# Patient Record
Sex: Female | Born: 1970 | Race: White | Hispanic: No | Marital: Married | Smoking: Never smoker
Health system: Southern US, Community
[De-identification: ages and names within clinical notes are randomized; demographics above are authoritative.]

## PROBLEM LIST (undated history)

## (undated) DIAGNOSIS — F3130 Bipolar disorder, current episode depressed, mild or moderate severity, unspecified: Secondary | ICD-10-CM

## (undated) DIAGNOSIS — J811 Chronic pulmonary edema: Secondary | ICD-10-CM

## (undated) DIAGNOSIS — J449 Chronic obstructive pulmonary disease, unspecified: Secondary | ICD-10-CM

## (undated) DIAGNOSIS — E119 Type 2 diabetes mellitus without complications: Secondary | ICD-10-CM

## (undated) DIAGNOSIS — R112 Nausea with vomiting, unspecified: Secondary | ICD-10-CM

## (undated) DIAGNOSIS — Z9889 Other specified postprocedural states: Secondary | ICD-10-CM

## (undated) DIAGNOSIS — L0390 Cellulitis, unspecified: Secondary | ICD-10-CM

## (undated) DIAGNOSIS — F32A Depression, unspecified: Secondary | ICD-10-CM

## (undated) DIAGNOSIS — I2699 Other pulmonary embolism without acute cor pulmonale: Secondary | ICD-10-CM

## (undated) DIAGNOSIS — F329 Major depressive disorder, single episode, unspecified: Secondary | ICD-10-CM

## (undated) HISTORY — DX: Type 2 diabetes mellitus without complications: E11.9

## (undated) HISTORY — PX: TUBAL LIGATION: SHX77

## (undated) HISTORY — PX: CHOLECYSTECTOMY: SHX55

---

## 2005-07-24 ENCOUNTER — Ambulatory Visit: Payer: Self-pay | Admitting: Family Medicine

## 2005-08-28 ENCOUNTER — Ambulatory Visit: Payer: Self-pay | Admitting: Family Medicine

## 2005-08-31 ENCOUNTER — Ambulatory Visit: Payer: Self-pay | Admitting: Family Medicine

## 2005-10-12 ENCOUNTER — Ambulatory Visit: Payer: Self-pay | Admitting: Family Medicine

## 2005-10-16 ENCOUNTER — Ambulatory Visit: Payer: Self-pay | Admitting: Family Medicine

## 2006-08-21 ENCOUNTER — Ambulatory Visit: Payer: Self-pay | Admitting: Family Medicine

## 2006-09-27 ENCOUNTER — Ambulatory Visit: Payer: Self-pay | Admitting: Family Medicine

## 2006-10-26 ENCOUNTER — Ambulatory Visit: Payer: Self-pay | Admitting: Family Medicine

## 2007-04-26 ENCOUNTER — Ambulatory Visit (HOSPITAL_COMMUNITY): Admission: RE | Admit: 2007-04-26 | Discharge: 2007-04-26 | Payer: Self-pay | Admitting: Obstetrics and Gynecology

## 2007-06-27 ENCOUNTER — Ambulatory Visit (HOSPITAL_COMMUNITY): Admission: RE | Admit: 2007-06-27 | Discharge: 2007-06-27 | Payer: Self-pay | Admitting: Obstetrics and Gynecology

## 2009-02-15 ENCOUNTER — Ambulatory Visit: Payer: Self-pay | Admitting: Surgery

## 2009-02-16 ENCOUNTER — Emergency Department (HOSPITAL_COMMUNITY): Admission: EM | Admit: 2009-02-16 | Discharge: 2009-02-16 | Payer: Self-pay | Admitting: Emergency Medicine

## 2009-02-17 ENCOUNTER — Inpatient Hospital Stay (HOSPITAL_COMMUNITY): Admission: EM | Admit: 2009-02-17 | Discharge: 2009-02-18 | Payer: Self-pay | Admitting: Emergency Medicine

## 2009-02-17 ENCOUNTER — Encounter (INDEPENDENT_AMBULATORY_CARE_PROVIDER_SITE_OTHER): Payer: Self-pay | Admitting: Internal Medicine

## 2010-10-02 ENCOUNTER — Encounter: Payer: Self-pay | Admitting: Obstetrics and Gynecology

## 2010-10-27 ENCOUNTER — Emergency Department (HOSPITAL_COMMUNITY)
Admission: EM | Admit: 2010-10-27 | Discharge: 2010-10-27 | Disposition: A | Discharge status: Home / Self Care | Payer: Medicare Other | Attending: Emergency Medicine | Admitting: Emergency Medicine

## 2010-10-27 DIAGNOSIS — J449 Chronic obstructive pulmonary disease, unspecified: Secondary | ICD-10-CM | POA: Insufficient documentation

## 2010-10-27 DIAGNOSIS — J4489 Other specified chronic obstructive pulmonary disease: Secondary | ICD-10-CM | POA: Insufficient documentation

## 2010-10-27 DIAGNOSIS — Z79899 Other long term (current) drug therapy: Secondary | ICD-10-CM | POA: Insufficient documentation

## 2010-10-27 DIAGNOSIS — F319 Bipolar disorder, unspecified: Secondary | ICD-10-CM | POA: Insufficient documentation

## 2010-10-27 DIAGNOSIS — I10 Essential (primary) hypertension: Secondary | ICD-10-CM | POA: Insufficient documentation

## 2010-10-27 DIAGNOSIS — R110 Nausea: Secondary | ICD-10-CM | POA: Insufficient documentation

## 2010-10-27 DIAGNOSIS — R109 Unspecified abdominal pain: Secondary | ICD-10-CM | POA: Insufficient documentation

## 2010-10-27 LAB — BASIC METABOLIC PANEL
BUN: 13 mg/dL (ref 6–23)
CO2: 29 mEq/L (ref 19–32)
Calcium: 9.1 mg/dL (ref 8.4–10.5)
Chloride: 99 mEq/L (ref 96–112)
Creatinine, Ser: 0.81 mg/dL (ref 0.4–1.2)
GFR calc non Af Amer: >60 mL/min (ref >60)
Glucose, Bld: 128 mg/dL — ABNORMAL HIGH (ref 70–99)

## 2010-10-27 LAB — CBC
MCHC: 31.1 g/dL (ref 30.0–36.0)
Platelets: 235 10*3/uL (ref 150–400)
RDW: 15.2 % (ref 11.5–15.5)

## 2010-10-27 LAB — URINALYSIS, ROUTINE W REFLEX MICROSCOPIC
Hgb urine dipstick: NEGATIVE
Ketones, ur: NEGATIVE mg/dL
Urine Glucose, Fasting: NEGATIVE mg/dL
pH: 6 (ref 5.0–8.0)

## 2010-10-27 LAB — DIFFERENTIAL
Eosinophils Relative: 1 % (ref 0–5)
Monocytes Relative: 9 % (ref 3–12)
Neutro Abs: 5.7 10*3/uL (ref 1.7–7.7)
Neutrophils Relative %: 60 % (ref 43–77)

## 2010-10-28 LAB — URINE CULTURE
Colony Count: NO GROWTH
Culture  Setup Time: 201202170147

## 2010-11-02 ENCOUNTER — Emergency Department (HOSPITAL_COMMUNITY)
Admission: EM | Admit: 2010-11-02 | Discharge: 2010-11-03 | Discharge status: Against Medical Advice | Payer: Medicare Other | Attending: Emergency Medicine | Admitting: Emergency Medicine

## 2010-11-02 DIAGNOSIS — Z0389 Encounter for observation for other suspected diseases and conditions ruled out: Secondary | ICD-10-CM | POA: Insufficient documentation

## 2010-12-19 LAB — COMPREHENSIVE METABOLIC PANEL
ALT: 28 U/L (ref 0–35)
AST: 44 U/L — ABNORMAL HIGH (ref 0–37)
Albumin: 3.5 g/dL (ref 3.5–5.2)
Alkaline Phosphatase: 91 U/L (ref 39–117)
BUN: 7 mg/dL (ref 6–23)
CO2: 27 mEq/L (ref 19–32)
Calcium: 9.2 mg/dL (ref 8.4–10.5)
Chloride: 99 mEq/L (ref 96–112)
Creatinine, Ser: 0.77 mg/dL (ref 0.4–1.2)
GFR calc Af Amer: >60 mL/min (ref >60)
GFR calc non Af Amer: >60 mL/min (ref >60)
Glucose, Bld: 100 mg/dL — ABNORMAL HIGH (ref 70–99)
Potassium: 3.3 mEq/L — ABNORMAL LOW (ref 3.5–5.1)
Sodium: 136 mEq/L (ref 135–145)
Total Bilirubin: 0.3 mg/dL (ref 0.3–1.2)
Total Protein: 7.2 g/dL (ref 6.0–8.3)

## 2010-12-19 LAB — LACTIC ACID, PLASMA: Lactic Acid, Venous: 1.5 mmol/L (ref 0.5–2.2)

## 2010-12-19 LAB — DIFFERENTIAL
Basophils Absolute: 0 10*3/uL (ref 0.0–0.1)
Basophils Absolute: 0.1 10*3/uL (ref 0.0–0.1)
Basophils Relative: 0 % (ref 0–1)
Basophils Relative: 1 % (ref 0–1)
Eosinophils Absolute: 0.1 10*3/uL (ref 0.0–0.7)
Eosinophils Relative: 1 % (ref 0–5)
Eosinophils Relative: 2 % (ref 0–5)
Lymphocytes Relative: 19 % (ref 12–46)
Lymphocytes Relative: 22 % (ref 12–46)
Lymphs Abs: 2.4 10*3/uL (ref 0.7–4.0)
Monocytes Absolute: 0.6 10*3/uL (ref 0.1–1.0)
Monocytes Absolute: 0.8 10*3/uL (ref 0.1–1.0)
Monocytes Relative: 8 % (ref 3–12)
Neutro Abs: 7.7 10*3/uL (ref 1.7–7.7)
Neutrophils Relative %: 70 % (ref 43–77)

## 2010-12-19 LAB — GLUCOSE, CAPILLARY
Glucose-Capillary: 104 mg/dL — ABNORMAL HIGH (ref 70–99)
Glucose-Capillary: 113 mg/dL — ABNORMAL HIGH (ref 70–99)
Glucose-Capillary: 96 mg/dL (ref 70–99)

## 2010-12-19 LAB — BASIC METABOLIC PANEL
CO2: 28 mEq/L (ref 19–32)
Calcium: 8.6 mg/dL (ref 8.4–10.5)
Chloride: 104 mEq/L (ref 96–112)
Creatinine, Ser: 0.87 mg/dL (ref 0.4–1.2)
GFR calc Af Amer: >60 mL/min (ref >60)

## 2010-12-19 LAB — CBC
HCT: 40.8 % (ref 36.0–46.0)
Hemoglobin: 14.5 g/dL (ref 12.0–15.0)
MCV: 86 fL (ref 78.0–100.0)
MCV: 86.3 fL (ref 78.0–100.0)
Platelets: 265 10*3/uL (ref 150–400)
Platelets: 278 10*3/uL (ref 150–400)
RBC: 4.74 MIL/uL (ref 3.87–5.11)
RDW: 14.5 % (ref 11.5–15.5)
RDW: 14.7 % (ref 11.5–15.5)
RDW: 15.2 % (ref 11.5–15.5)
WBC: 9.8 10*3/uL (ref 4.0–10.5)

## 2010-12-19 LAB — CULTURE, BLOOD (ROUTINE X 2): Culture: NO GROWTH

## 2010-12-19 LAB — BRAIN NATRIURETIC PEPTIDE: Pro B Natriuretic peptide (BNP): 32 pg/mL (ref 0.0–100.0)

## 2011-01-24 NOTE — Discharge Summary (Signed)
Aimee Phillips, Aimee Phillips               ACCOUNT NO.:  [id number]   MEDICAL RECORD NO.:  [id number]          PATIENT TYPE:  INP   LOCATION:  5118                         FACILITY:  [facility]   PHYSICIAN:  Altha Harm, MDDATE OF BIRTH:  07-May-1971   DATE OF ADMISSION:  02/16/2009  DATE OF DISCHARGE:  02/18/2009                               DISCHARGE SUMMARY   DISCHARGE DISPOSITION:  Home.   FINAL DISCHARGE DIAGNOSES:  1. Cellulitis of the left lower extremity.  2. Diabetes, type 2, new onset.  3. Morbid obesity.  4. Increased risk for obstructive sleep apnea, needs further      evaluation.  5. History of hypertension.  6. History of depression.  7. Reactive airway disease.   DISCHARGE MEDICATIONS:  Include the following:  1. Procardia XL 90 mg p.o. daily.  2. Lasix 20 mg p.o. daily.  3. Zoloft 50 mg p.o. daily.  4. Albuterol MDI 2 puffs p.o. every 2 to 4 hours p.r.n.  5. Amaryl 1 mg p.o. daily.  6. Avelox 400 mg p.o. daily x10 days.   CONSULTANTS:  None.   PROCEDURES:  None.   DIAGNOSTIC STUDIES:  A 2-view chest x-ray done on admission which shows  bronchitic-type lung changes without focal infiltrates or effusions.   PRIMARY CARE PHYSICIAN:  Dr. Lysbeth Galas, [place], [place].   ALLERGIES:  NO KNOWN DRUG ALLERGIES.   CODE STATUS:  Full code.   CHIEF COMPLAINT:  Pain in the left lower extremity.   HISTORY OF PRESENT ILLNESS:  Please refer to the H and P by Dr. Ladona Ridgel  for details of the HPI.   However, this is a 40 year old, grossly obese female with history of  hypertension and depression who presents to the emergency room with  increase in pain, tightness, and redness of the left lower extremity.   HOSPITAL COURSE:  1. Due to the significant edema in the left lower extremity, the      patient was admitted and started on IV antibiotics including Zosyn      and vancomycin.  The patient had a traumatic increase in erythema      and swelling.  Her antibiotics were  changed from IV form to a pill      form with antibiotics including Avelox 400 mg p.o. daily.  The      patient continues to defervesce.  She was afebrile and without an      elevation of her white blood cell count.  The patient is being      discharged on Avelox 400 mg to complete a 10-day course.  She is to      follow up with Dr. Lysbeth Galas post the antibiotics reevaluation.  2. New onset diabetes, type 2.  The patient was found to have a      hemoglobin A1c of 6.2 which is diagnostic of diabetes, type 2.  The      patient started on low-dose Amaryl 1 mg p.o. daily.  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  In addition, her husband also      has diabetes and manages it at home.  I have spoken to the patient      about outpatient diabetic education and she states that she will      pursue that at Dr. Joyce Copa office.  3. Hypertension.  Patient was normotensive during her hospitalization.  4. Risks for obstructive sleep apnea.  The patient is morbidly obese      and based upon her neck size she is at increased risk of      obstructive sleep apnea.  I would recommend that the patient does      have followup for that as an outpatient.  Otherwise, the patient      remains stable.   FOLLOWUP:  The patient is to follow up with Dr. Lysbeth Galas within 2 weeks  for further evaluation.  The patient does need to have a fasting  cholesterol done.  This was not done here in the hospital.   DIETARY RESTRICTIONS:  The patient should be on a diabetic, heart-  healthy diet.   PHYSICAL RESTRICTIONS:  Activity as tolerated.   TOTAL TIME FOR THIS DISCHARGE PROCESS:  39 minutes.      Altha Harm, MD  Electronically Signed     MAM/MEDQ  D:  02/18/2009  T:  02/18/2009  Job:  [id number]

## 2011-01-24 NOTE — H&P (Signed)
Aimee Phillips, Aimee Phillips               ACCOUNT NO.:  [id number]   MEDICAL RECORD NO.:  [id number]          PATIENT TYPE:  INP   LOCATION:  5118                         FACILITY:  [facility]   PHYSICIAN:  Melissa L. Ladona Ridgel, MD  DATE OF BIRTH:  10-Mar-1971   DATE OF ADMISSION:  02/16/2009  DATE OF DISCHARGE:                              HISTORY & PHYSICAL   CHIEF COMPLAINT:  Pain in the left leg.   PRIMARY CARE PHYSICIAN:  Delaney Meigs, M.D.   HISTORY OF PRESENT ILLNESS:  The patient is a 40 year old white female  with a past medical history significant for hypertension and depression  who presents to the emergency room this evening on the advice of her in-  laws because of left leg tightness and knee pain with erythema.  The  patient states that approximately 48 hours ago, she developed knee pain  which she thought was from arthritis that she has been diagnosed with.  Subsequently her spouse noticed that her leg was tight, warm and  painful.  She did not note fever at home but here in the emergency room  is documented as having a low grade temperature.  The patient also  reports coughing up some yellowish sputum for which she was treated in  the outpatient setting with Augmentin and it is improved somewhat but  remains present.   REVIEW OF SYSTEMS:  Her weight has been the same.  She has had no fever  or chills that she is aware of but has documented them in the emergency  room.  EYES:  No blurred vision, double vision.  ENT:  No tinnitus,  dysphagia, discharge.  CARDIOVASCULAR:  No chest pain tonight although  she does have occasional chest pain.  No palpitations. PULMONARY:  Shortness of breath is chronic.  GI:  No nausea, vomiting or diarrhea.  No melena or hematochezia.  GU:  No hesitancy, frequency or dysuria.  NEUROLOGIC:  No headache or seizure  disorder.  MUSCULOSKELETAL:  She has arthritis in her knees.  HEMATOLOGIC:  No bleeding disorder.  PSYCHIATRIC:  Positive for some  depression.   ALLERGIES:  ASPIRIN.  She says she has taken it in recent years and it  has not bothered her but she did have a reaction in her younger years.   MEDICATIONS:  1. Procardia XL 90 mg p.o. daily.  2. Lasix 20 mg p.o. daily.  3. Zoloft 50 mg p.o. daily.  4. Albuterol MDI inhaler as needed for shortness of breath.   SOCIAL HISTORY:  She worked as a Lawyer previously.  She has 3 children,  the youngest is 2.  Her husband's name is Jeannett Senior.  His contact number  is [phone].   FAMILY HISTORY:  Mother deceased with a history of breast cancer.  Dad  is deceased with a history of pneumonia and MI.   PHYSICAL EXAMINATION:  VITAL SIGNS:  Temperature 100.1, blood pressure  105/50, pulse 96, respirations 22, saturations 96%.  GENERAL:  This is a morbidly obese white female in no acute distress.  She is resting comfortably.  She is noted, however, to  have some apnea  after receiving Dilaudid and her saturations decreased to 88 after  Dilaudid.  She is otherwise normocephalic, atraumatic.  Pupils are equal, round and  reactive to light and accommodation. Extraocular muscles are intact.  She has anicteric sclerae.  Examination of the ears revealed tympanic  membranes intact bilaterally without discharge.  Examination of the nose  revealed septum midline without discharge.  Examination of the mouth  revealed no oral lesions or lip lesions.  She has reasonable dentition.  NECK:  Supple but short.  I do not appreciate lymphadenopathy or  thyromegaly.  CHEST:  Decreased but clear to auscultation.  I do not appreciate  rhonchi, rales or wheezes.  The patient has left breast candidal yeast  infection.  CARDIOVASCULAR:  Regular rate and rhythm.  Positive S1 and S2, no S3 or  S4.  I do not appreciate a murmur, rub or gallop but again her heart  sounds are distant.  Apical impulse cannot be palpated.  ABDOMEN:  Obese, nontender, nondistended.  Positive bowel sounds with no  hepatosplenomegaly.   No guarding or rebound.  I do not appreciate any  bruits.  There does not appear to be any yeast under the patient's  pannus.  EXTREMITIES:  There is generalized edema, with left greater than right  erythema and tenderness of the calf.  NEUROLOGIC:  She is awake, alert and oriented.  Cranial nerves II  through XII are intact. Power is 5/5. DTRs are 2+.  Plantars are  downgoing.   PERTINENT LABORATORY:  White count is 11, hemoglobin 14.5, hematocrit  45.3, platelets 278.  Sodium 136, potassium 3.3, chloride 99, CO2 27,  BUN 7  creatinine 0.77, calcium  9.2.  Liver function tests within  normal limits although her AST is slightly elevated at 44.  BNP is 32.  Chest x-ray shows mediastinal and hilar contours within normal limits.  There is some peribronchial thickening with increased interstitial  markings consistent with bronchitis, interstitial pneumonitis or  interstitial edema.  There is no pulmonary effusion.  There is no focal  infiltrate.  The bony thorax is intact.  Her lactic acid is 1.5.   ASSESSMENT AND PLAN:  This is a 40 year old morbidly obese white female  who presents with fever, leg redness and swelling for at least 2 days.  1. Leg cellulitis.  Will continue her on vancomycin and Zosyn, started      in the emergency room.  Doppler of the lower extremities will be      ordered in the a.m.  The patient will be on Lovenox 1 mg/kg      adjusted per pharmacy for the appropriate weight dosing.  Will      check hemoglobin A1c in the morning.  2. Hypertension.  Continue Procardia XL and Lasix.  3. Sleep apnea.  Will continue her pulse oximetry monitoring and watch      opiates, will also use 2 liters nasal oxygen if necessary.  4. Depression.  Continue Zoloft.  5. Deep vein thrombosis prophylaxis.  She will be on Lovenox per      pharmacy.  6. Yeast infection.  Will provide with Nystatin cream for the      infection under her left breast.      Melissa L. Ladona Ridgel, MD   Electronically Signed     MLT/MEDQ  D:  02/17/2009  T:  02/17/2009  Job:  [id number]   cc:   Delaney Meigs, M.D.

## 2011-02-02 ENCOUNTER — Emergency Department (HOSPITAL_COMMUNITY): Payer: Medicare Other

## 2011-02-02 ENCOUNTER — Emergency Department (HOSPITAL_COMMUNITY)
Admission: EM | Admit: 2011-02-02 | Discharge: 2011-02-02 | Disposition: A | Discharge status: Home / Self Care | Payer: Medicare Other | Attending: Emergency Medicine | Admitting: Emergency Medicine

## 2011-02-02 DIAGNOSIS — M7989 Other specified soft tissue disorders: Secondary | ICD-10-CM | POA: Insufficient documentation

## 2011-02-02 DIAGNOSIS — R0989 Other specified symptoms and signs involving the circulatory and respiratory systems: Secondary | ICD-10-CM | POA: Insufficient documentation

## 2011-02-02 DIAGNOSIS — J449 Chronic obstructive pulmonary disease, unspecified: Secondary | ICD-10-CM | POA: Insufficient documentation

## 2011-02-02 DIAGNOSIS — J4489 Other specified chronic obstructive pulmonary disease: Secondary | ICD-10-CM | POA: Insufficient documentation

## 2011-02-02 DIAGNOSIS — F319 Bipolar disorder, unspecified: Secondary | ICD-10-CM | POA: Insufficient documentation

## 2011-02-02 DIAGNOSIS — R0609 Other forms of dyspnea: Secondary | ICD-10-CM | POA: Insufficient documentation

## 2011-02-02 DIAGNOSIS — R0602 Shortness of breath: Secondary | ICD-10-CM | POA: Insufficient documentation

## 2011-02-02 DIAGNOSIS — I10 Essential (primary) hypertension: Secondary | ICD-10-CM | POA: Insufficient documentation

## 2011-02-02 LAB — CBC
MCHC: 30.7 g/dL (ref 30.0–36.0)
MCV: 92 fL (ref 78.0–100.0)
Platelets: 200 10*3/uL (ref 150–400)
RDW: 15.6 % — ABNORMAL HIGH (ref 11.5–15.5)
WBC: 7.9 10*3/uL (ref 4.0–10.5)

## 2011-02-02 LAB — BASIC METABOLIC PANEL
BUN: 10 mg/dL (ref 6–23)
Calcium: 9.4 mg/dL (ref 8.4–10.5)
Creatinine, Ser: 0.75 mg/dL (ref 0.4–1.2)
GFR calc non Af Amer: >60 mL/min (ref >60)
Glucose, Bld: 103 mg/dL — ABNORMAL HIGH (ref 70–99)
Sodium: 135 mEq/L (ref 135–145)

## 2011-02-02 LAB — DIFFERENTIAL
Eosinophils Absolute: 0.1 10*3/uL (ref 0.0–0.7)
Eosinophils Relative: 1 % (ref 0–5)
Lymphs Abs: 2 10*3/uL (ref 0.7–4.0)

## 2011-03-20 ENCOUNTER — Emergency Department (HOSPITAL_COMMUNITY)
Admission: EM | Admit: 2011-03-20 | Discharge: 2011-03-20 | Discharge status: Against Medical Advice | Payer: Medicare Other

## 2011-03-20 ENCOUNTER — Encounter: Payer: Self-pay | Admitting: *Deleted

## 2011-03-20 HISTORY — DX: Major depressive disorder, single episode, unspecified: F32.9

## 2011-03-20 HISTORY — DX: Bipolar disorder, current episode depressed, mild or moderate severity, unspecified: F31.30

## 2011-03-20 HISTORY — DX: Chronic obstructive pulmonary disease, unspecified: J44.9

## 2011-03-20 HISTORY — DX: Depression, unspecified: F32.A

## 2011-03-20 NOTE — ED Notes (Signed)
Patient c/o lt. Leg pain and swelling since 4-67months

## 2011-08-01 ENCOUNTER — Encounter (HOSPITAL_COMMUNITY): Payer: Self-pay | Admitting: Emergency Medicine

## 2011-08-01 ENCOUNTER — Observation Stay (HOSPITAL_COMMUNITY)
Admission: EM | Admit: 2011-08-01 | Discharge: 2011-08-02 | Disposition: A | Discharge status: Home / Self Care | Payer: Medicare Other | Attending: Internal Medicine | Admitting: Internal Medicine

## 2011-08-01 DIAGNOSIS — R0609 Other forms of dyspnea: Secondary | ICD-10-CM | POA: Insufficient documentation

## 2011-08-01 DIAGNOSIS — E662 Morbid (severe) obesity with alveolar hypoventilation: Secondary | ICD-10-CM | POA: Diagnosis present

## 2011-08-01 DIAGNOSIS — Z79899 Other long term (current) drug therapy: Secondary | ICD-10-CM | POA: Insufficient documentation

## 2011-08-01 DIAGNOSIS — M7989 Other specified soft tissue disorders: Secondary | ICD-10-CM | POA: Insufficient documentation

## 2011-08-01 DIAGNOSIS — M79609 Pain in unspecified limb: Secondary | ICD-10-CM | POA: Insufficient documentation

## 2011-08-01 DIAGNOSIS — L03119 Cellulitis of unspecified part of limb: Secondary | ICD-10-CM | POA: Insufficient documentation

## 2011-08-01 DIAGNOSIS — J9600 Acute respiratory failure, unspecified whether with hypoxia or hypercapnia: Secondary | ICD-10-CM | POA: Insufficient documentation

## 2011-08-01 DIAGNOSIS — R0989 Other specified symptoms and signs involving the circulatory and respiratory systems: Secondary | ICD-10-CM | POA: Insufficient documentation

## 2011-08-01 DIAGNOSIS — L218 Other seborrheic dermatitis: Secondary | ICD-10-CM | POA: Insufficient documentation

## 2011-08-01 DIAGNOSIS — Z6841 Body Mass Index (BMI) 40.0 and over, adult: Secondary | ICD-10-CM | POA: Insufficient documentation

## 2011-08-01 DIAGNOSIS — R0902 Hypoxemia: Secondary | ICD-10-CM

## 2011-08-01 DIAGNOSIS — F3130 Bipolar disorder, current episode depressed, mild or moderate severity, unspecified: Secondary | ICD-10-CM | POA: Insufficient documentation

## 2011-08-01 DIAGNOSIS — L02419 Cutaneous abscess of limb, unspecified: Principal | ICD-10-CM | POA: Insufficient documentation

## 2011-08-01 LAB — URINALYSIS, ROUTINE W REFLEX MICROSCOPIC
Ketones, ur: NEGATIVE mg/dL
Leukocytes, UA: NEGATIVE
Urobilinogen, UA: 1 mg/dL (ref 0.0–1.0)

## 2011-08-01 LAB — URINE MICROSCOPIC-ADD ON

## 2011-08-01 LAB — DIFFERENTIAL
Basophils Absolute: 0 10*3/uL (ref 0.0–0.1)
Basophils Relative: 0 % (ref 0–1)
Eosinophils Absolute: 0.1 10*3/uL (ref 0.0–0.7)
Eosinophils Relative: 1 % (ref 0–5)
Neutrophils Relative %: 68 % (ref 43–77)

## 2011-08-01 LAB — BASIC METABOLIC PANEL
Calcium: 9 mg/dL (ref 8.4–10.5)
GFR calc Af Amer: >90 mL/min (ref >90)
GFR calc non Af Amer: >90 mL/min (ref >90)
Potassium: 3.9 mEq/L (ref 3.5–5.1)
Sodium: 137 mEq/L (ref 135–145)

## 2011-08-01 LAB — CBC
MCH: 27.7 pg (ref 26.0–34.0)
MCHC: 29.7 g/dL — ABNORMAL LOW (ref 30.0–36.0)
Platelets: 214 10*3/uL (ref 150–400)
RBC: 4.87 MIL/uL (ref 3.87–5.11)
RDW: 15.9 % — ABNORMAL HIGH (ref 11.5–15.5)

## 2011-08-01 MED ORDER — OXYCODONE-ACETAMINOPHEN 5-325 MG PO TABS
1.0000 | ORAL_TABLET | ORAL | Status: DC | PRN
  Administered 2011-08-01 – 2011-08-02 (×2): 1 via ORAL
  Filled 2011-08-01 (×2): qty 1

## 2011-08-01 MED ORDER — ONDANSETRON HCL 4 MG/2ML IJ SOLN
4.0000 mg | Freq: Once | INTRAMUSCULAR | Status: AC
  Administered 2011-08-01: 4 mg via INTRAVENOUS
  Filled 2011-08-01: qty 2

## 2011-08-01 MED ORDER — CEFAZOLIN SODIUM 1-5 GM-% IV SOLN
1.0000 g | Freq: Once | INTRAVENOUS | Status: AC
  Administered 2011-08-01: 1 g via INTRAVENOUS
  Filled 2011-08-01: qty 50

## 2011-08-01 MED ORDER — SODIUM CHLORIDE 0.9 % IV SOLN
INTRAVENOUS | Status: AC
  Administered 2011-08-02: 01:00:00 via INTRAVENOUS

## 2011-08-01 MED ORDER — HYDROMORPHONE HCL PF 1 MG/ML IJ SOLN
1.0000 mg | Freq: Once | INTRAMUSCULAR | Status: AC
  Administered 2011-08-01: 1 mg via INTRAVENOUS
  Filled 2011-08-01: qty 1

## 2011-08-01 NOTE — ED Provider Notes (Addendum)
History     CSN: [id number] Arrival date & time: 08/01/2011  6:32 PM   First MD Initiated Contact with Patient 08/01/11 1850      Chief Complaint  Patient presents with  . Leg Pain  . Hip Pain    (Consider location/radiation/quality/duration/timing/severity/associated sxs/prior treatment) The history is provided by the patient.   She had onset last night of a severe, squeezing pain in both thighs which radiates to both calves. She rates pain at 8/10 currently and 10 out of 10 at its worst. Pain is worse with movement. She has taken Vicodin with partial, temporary relief. She has a history of cellulitis in the past which presented in a similar way, and she did require inpatient intravenous therapy. Past Medical History  Diagnosis Date  . COPD (chronic obstructive pulmonary disease)   . Depression   . Bipolar affect, depressed     Past Surgical History  Procedure Date  . Cesarean section   . Cholecystectomy     History reviewed. No pertinent family history.  History  Substance Use Topics  . Smoking status: Never Smoker   . Smokeless tobacco: Not on file  . Alcohol Use: No    OB History    Grav Para Term Preterm Abortions TAB SAB Ect Mult Living                  Review of Systems  All other systems reviewed and are negative.    Allergies  Review of patient's allergies indicates no known allergies.  Home Medications   Current Outpatient Rx  Name Route Sig Dispense Refill  . ARIPIPRAZOLE 2 MG PO TABS Oral Take 2 mg by mouth daily.      . BUDESONIDE-FORMOTEROL FUMARATE 160-4.5 MCG/ACT IN AERO Inhalation Inhale 2 puffs into the lungs 2 (two) times daily.      . DESVENLAFAXINE SUCCINATE 50 MG PO TB24 Oral Take 50 mg by mouth daily.      . FUROSEMIDE 20 MG PO TABS Oral Take 40 mg by mouth daily.      Marland Kitchen HYDROCODONE-ACETAMINOPHEN 5-500 MG PO TABS Oral Take 1 tablet by mouth every 6 (six) hours as needed.      Marland Kitchen NAPROXEN 250 MG PO TABS Oral Take 500 mg by mouth 2  (two) times daily with a meal.      . POTASSIUM CHLORIDE 20 MEQ PO PACK Oral Take 20 mEq by mouth 2 (two) times daily.      Marland Kitchen SPIRONOLACTONE 50 MG PO TABS Oral Take 50 mg by mouth daily.      . TRAMADOL HCL 50 MG PO TABS Oral Take 50 mg by mouth every 6 (six) hours as needed.      . TRAZODONE HCL 100 MG PO TABS Oral Take 100 mg by mouth at bedtime.        BP 137/0  Pulse 99  Temp(Src) 98.3 F (36.8 C) (Oral)  Resp 24  Ht 5\' 9"  (1.753 m)  Wt 428 lb (194.14 kg)  BMI 63.20 kg/m2  SpO2 99%  LMP 07/31/2011  Physical Exam  Nursing note and vitals reviewed.  A morbidly obese 40 year old female who appears mildly uncomfortable. Vital signs are normal. Head is normocephalic and atraumatic. PERRLA, EOMI. Oropharynx is clear. Neck is supple without adenopathy or JVD. Back is nontender and there is no CVA tenderness. Lungs are clear without rales, wheezes, rhonchi. Heart has regular rate and rhythm without murmur. Abdomen is obese, soft, and nontender. Extremities there is a  2-3+ pitting edema bilaterally. Left leg is more swollen than the right, but the patient states that the swelling in the left leg is actually less than what it usually is. There is mild erythema of the skin of both legs with mild warmth and moderate tenderness diffusely. Capillary refill is delayed but symmetric-refill it occurs over 5 seconds. Neurologic: Mental status is normal, cranial nerves are intact, there are no motor or sensory deficits. ED Course  Procedures (including critical care time)   Labs Reviewed  CBC  DIFFERENTIAL  BASIC METABOLIC PANEL  SEDIMENTATION RATE  URINALYSIS, ROUTINE W REFLEX MICROSCOPIC   No results found.   No diagnosis found. Results for orders placed during the hospital encounter of 08/01/11  CBC      Component Value Range   WBC 9.3  4.0 - 10.5 (K/uL)   RBC 4.87  3.87 - 5.11 (MIL/uL)   Hemoglobin 13.5  12.0 - 15.0 (g/dL)   HCT 57.8  46.9 - 62.9 (%)   MCV 93.4  78.0 - 100.0 (fL)    MCH 27.7  26.0 - 34.0 (pg)   MCHC 29.7 (*) 30.0 - 36.0 (g/dL)   RDW 52.8 (*) 41.3 - 15.5 (%)   Platelets 214  150 - 400 (K/uL)  DIFFERENTIAL      Component Value Range   Neutrophils Relative 68  43 - 77 (%)   Neutro Abs 6.3  1.7 - 7.7 (K/uL)   Lymphocytes Relative 23  12 - 46 (%)   Lymphs Abs 2.1  0.7 - 4.0 (K/uL)   Monocytes Relative 8  3 - 12 (%)   Monocytes Absolute 0.7  0.1 - 1.0 (K/uL)   Eosinophils Relative 1  0 - 5 (%)   Eosinophils Absolute 0.1  0.0 - 0.7 (K/uL)   Basophils Relative 0  0 - 1 (%)   Basophils Absolute 0.0  0.0 - 0.1 (K/uL)  BASIC METABOLIC PANEL      Component Value Range   Sodium 137  135 - 145 (mEq/L)   Potassium 3.9  3.5 - 5.1 (mEq/L)   Chloride 97  96 - 112 (mEq/L)   CO2 35 (*) 19 - 32 (mEq/L)   Glucose, Bld 117 (*) 70 - 99 (mg/dL)   BUN 14  6 - 23 (mg/dL)   Creatinine, Ser 2.44  0.50 - 1.10 (mg/dL)   Calcium 9.0  8.4 - 01.0 (mg/dL)   GFR calc non Af Amer >90  >90 (mL/min)   GFR calc Af Amer >90  >90 (mL/min)  SEDIMENTATION RATE      Component Value Range   Sed Rate 24 (*) 0 - 22 (mm/hr)  URINALYSIS, ROUTINE W REFLEX MICROSCOPIC      Component Value Range   Color, Urine YELLOW  YELLOW    Appearance CLEAR  CLEAR    Specific Gravity, Urine 1.025  1.005 - 1.030    pH 6.5  5.0 - 8.0    Glucose, UA NEGATIVE  NEGATIVE (mg/dL)   Hgb urine dipstick MODERATE (*) NEGATIVE    Bilirubin Urine NEGATIVE  NEGATIVE    Ketones, ur NEGATIVE  NEGATIVE (mg/dL)   Protein, ur TRACE (*) NEGATIVE (mg/dL)   Urobilinogen, UA 1.0  0.0 - 1.0 (mg/dL)   Nitrite NEGATIVE  NEGATIVE    Leukocytes, UA NEGATIVE  NEGATIVE   URINE MICROSCOPIC-ADD ON      Component Value Range   Squamous Epithelial / LPF MANY (*) RARE    WBC, UA 0-2  <3 (WBC/hpf)  RBC / HPF 3-6  <3 (RBC/hpf)   Bacteria, UA RARE  RARE    No results found.  She was given a dose of Ancef to treat her cellulitis. Because of her being afebrile and normal WBC and relatively low sedimentation rate, I felt that  she could probably treat her cellulitis as an outpatient. However, as she was gotten up to 30 for discharge, she was noted to be hypoxic. With minimal ambulation, O2 saturations dropped into the lower 80s. Even at rest, with no oxygen, and her oxygen saturation was 89-90%. Accordingly, arrangements are made to admit the patient to keep her on supplemental oxygen. Because of her weight, and the hypoxia may actually be normal for her with a pickwickian syndrome. High CO2 on her basic metabolic panel would be consistent with chronic CO2 retention which would be associated with chronic hypoxia. Case has been discussed with Dr. Orvan Falconer of [facility] who agrees to put the patient in the hospital under observation status.   MDM  Cellulitis-workup needs to be done to see if infection is serious enough to warrant initial inpatient management.        Dione Booze, MD 08/01/11 [id number]  Dione Booze, MD 09/20/11 [phone]

## 2011-08-01 NOTE — ED Notes (Signed)
Received report assessment unchanged 

## 2011-08-01 NOTE — ED Notes (Signed)
Pts 02 sat continue to decrease without 02. Unable to titrate pt off of 02. RA sats decreased to 79%. Sat pt up on side of bed to get pt more awake from dilaudid that was given earlier. Encouraged pt to take deep breaths, Sats remained between 83-86% on RA.  Pt had no complaints, however, pt appeared labored from minimal activity Pt states this is her baseline.  02 reapplied at 2L  sats increased to 92%.

## 2011-08-01 NOTE — ED Notes (Signed)
Pt c/o left leg and hip pain since last night.

## 2011-08-01 NOTE — H&P (Signed)
PCP:   Aniceto Boss, PA   Chief Complaint:  Pain left leg since last night  HPI: Aimee Phillips is an 40 y.o. morbidly obese Caucasian female.  Ends to the emergency room because of recent onset of pain and redness of the left leg. Denies history of trauma, denies sick contacts. He should was treated in the emergency room and was being prepared to be discharged home on oral antibiotics for cellulitis, then it was noted that her saturations were in the low 80s after having received a dose of Dilaudid for pain. Patient gives a history of easy daytime somnolence, and frequent waking at night. As of her body habitus she is presumed to have chronic hypoxia is considered unsafe to be discharged on the hospitalist service was called to assist with management.  She has marked dyspnea on exertion and it was extremely self-conscious about her weight and would love assistance with weight loss. Patient reports that she has been told in the past that she likely has obstructive sleep apnea, but the PA she sees in [place], has consistently ignored her a request for referral. He gives her last known weight as 482 pounds and her height is 5 feet 9 inches.   Review of Systems:  The patient denies anorexia, fever, weight loss,, vision loss, decreased hearing, hoarseness, chest pain, syncope, , peripheral edema, balance deficits, hemoptysis, abdominal pain, melena, hematochezia, severe indigestion/heartburn, hematuria, incontinence, genital sores, muscle weakness, suspicious skin lesions, transient blindness, difficulty walking, depression, unusual weight change, abnormal bleeding, enlarged lymph nodes, angioedema, and breast masses.   Past Medical History  Diagnosis Date  . COPD (chronic obstructive pulmonary disease)   . Depression   . Bipolar affect, depressed     Past Surgical History  Procedure Date  . Cesarean section   . Cholecystectomy     Medications:  HOME MEDS: Prior to Admission  medications   Medication Sig Start Date End Date Taking? Authorizing Provider  albuterol (PROAIR HFA) 108 (90 BASE) MCG/ACT inhaler Inhale 2 puffs into the lungs 2 (two) times daily.     Yes Historical Provider, MD  ARIPiprazole (ABILIFY) 2 MG tablet Take 2 mg by mouth every morning.    Yes Historical Provider, MD  budesonide-formoterol (SYMBICORT) 160-4.5 MCG/ACT inhaler Inhale 2 puffs into the lungs 2 (two) times daily.     Yes Historical Provider, MD  desvenlafaxine (PRISTIQ) 50 MG 24 hr tablet Take 50 mg by mouth every morning.    Yes Historical Provider, MD  furosemide (LASIX) 20 MG tablet Take 40 mg by mouth every morning.    Yes Historical Provider, MD  HYDROcodone-acetaminophen (VICODIN) 5-500 MG per tablet Take 1 tablet by mouth 3 (three) times daily as needed. For pain   Yes Historical Provider, MD  loratadine (CLARITIN) 10 MG tablet Take 10 mg by mouth every morning.     Yes Historical Provider, MD  naproxen (NAPROSYN) 250 MG tablet Take 500 mg by mouth 2 (two) times daily with a meal.     Yes Historical Provider, MD  OVER THE COUNTER MEDICATION Take 2 capsules by mouth 3 (three) times daily.     Yes Historical Provider, MD  potassium chloride (KLOR-CON) 10 MEQ CR tablet Take 10 mEq by mouth every morning.     Yes Historical Provider, MD  spironolactone (ALDACTONE) 50 MG tablet Take 50 mg by mouth every morning.    Yes Historical Provider, MD  traMADol (ULTRAM) 50 MG tablet Take 50 mg by mouth 3 (three) times  daily as needed. For pain   Yes Historical Provider, MD  traMADol (ULTRAM) 50 MG tablet Take 50 mg by mouth at bedtime. Maximum dose= 8 tablets per day/ PRESCRIPTION STATES **Take one tablet by mouth 3 times daily for pain**    Yes Historical Provider, MD  traZODone (DESYREL) 100 MG tablet Take 100 mg by mouth at bedtime.     Yes Historical Provider, MD     Allergies:  No Known Allergies  Social History:   reports that she has never smoked. She does not have any smokeless  tobacco history on file. She reports that she does not drink alcohol or use illicit drugs. with husband and children the youngest is 88 years old  Family History: History reviewed. No pertinent family history. mother had breast cancer father had acute MI   Physical Exam: Filed Vitals:   08/01/11 1741 08/01/11 2025 08/01/11 2026  BP: 137/70 113/70   Pulse: 99 99   Temp: 98.3 F (36.8 C) 98.3 F (36.8 C)   TempSrc: Oral Oral   Resp: 24 24   Height: 5\' 9"  (1.753 m)    Weight: 194.14 kg (428 lb)    SpO2: 99% 77% 92%   Blood pressure 113/70, pulse 99, temperature 98.3 F (36.8 C), temperature source Oral, resp. rate 24, height 5\' 9"  (1.753 m), weight 194.14 kg (428 lb), last menstrual period 07/31/2011, SpO2 92.00%.  GEN:  Morbidly obese depressed looking Caucasian lady sitting up in the stretcher complaining of pain in the leg; cooperative with exam PSYCH:  alert and oriented x4;  appears depressed; affect is normal HEENT: Mucous membranes pink and anicteric; PERRLA; EOM intact; no cervical lymphadenopathy nor thyromegaly or carotid bruit; no JVD; seborrheic eczema of scalp and face Breasts:: Not examined CHEST WALL: No tenderness CHEST: Distant heart sounds but Normal respiration, clear to auscultation bilaterally HEART: Regular rate and rhythm; no murmurs rubs or gallops BACK: No kyphosis or scoliosis; no CVA tenderness ABDOMEN: Morbidly obese Obese, soft non-tender; no masses, no organomegaly, normal abdominal bowel sounds; huge pannus erythema and striae of the lower abdomen; no intertriginous candida. Rectal Exam: Not done EXTREMITIES: No bone or joint deformity; age-appropriate arthropathy of the hands and knees; trace bilateral edema; no ulcerations. Erythema of the right leg from ankle to knee Genitalia: not examined PULSES: 2+ and symmetric SKIN: Other than noted above Normal hydration no rash or ulceration CNS: Cranial nerves 2-12 grossly intact no focal neurologic deficit    Labs & Imaging Results for orders placed during the hospital encounter of 08/01/11 (from the past 48 hour(s))  CBC     Status: Abnormal   Collection Time   08/01/11  7:41 PM      Component Value Range Comment   WBC 9.3  4.0 - 10.5 (K/uL)    RBC 4.87  3.87 - 5.11 (MIL/uL)    Hemoglobin 13.5  12.0 - 15.0 (g/dL)    HCT 78.2  95.6 - 21.3 (%)    MCV 93.4  78.0 - 100.0 (fL)    MCH 27.7  26.0 - 34.0 (pg)    MCHC 29.7 (*) 30.0 - 36.0 (g/dL)    RDW 08.6 (*) 57.8 - 15.5 (%)    Platelets 214  150 - 400 (K/uL)   DIFFERENTIAL     Status: Normal   Collection Time   08/01/11  7:41 PM      Component Value Range Comment   Neutrophils Relative 68  43 - 77 (%)    Neutro  Abs 6.3  1.7 - 7.7 (K/uL)    Lymphocytes Relative 23  12 - 46 (%)    Lymphs Abs 2.1  0.7 - 4.0 (K/uL)    Monocytes Relative 8  3 - 12 (%)    Monocytes Absolute 0.7  0.1 - 1.0 (K/uL)    Eosinophils Relative 1  0 - 5 (%)    Eosinophils Absolute 0.1  0.0 - 0.7 (K/uL)    Basophils Relative 0  0 - 1 (%)    Basophils Absolute 0.0  0.0 - 0.1 (K/uL)   BASIC METABOLIC PANEL     Status: Abnormal   Collection Time   08/01/11  7:41 PM      Component Value Range Comment   Sodium 137  135 - 145 (mEq/L)    Potassium 3.9  3.5 - 5.1 (mEq/L)    Chloride 97  96 - 112 (mEq/L)    CO2 35 (*) 19 - 32 (mEq/L)    Glucose, Bld 117 (*) 70 - 99 (mg/dL)    BUN 14  6 - 23 (mg/dL)    Creatinine, Ser 1.61  0.50 - 1.10 (mg/dL)    Calcium 9.0  8.4 - 10.5 (mg/dL)    GFR calc non Af Amer >90  >90 (mL/min)    GFR calc Af Amer >90  >90 (mL/min)   SEDIMENTATION RATE     Status: Abnormal   Collection Time   08/01/11  7:41 PM      Component Value Range Comment   Sed Rate 24 (*) 0 - 22 (mm/hr)   URINALYSIS, ROUTINE W REFLEX MICROSCOPIC     Status: Abnormal   Collection Time   08/01/11  7:58 PM      Component Value Range Comment   Color, Urine YELLOW  YELLOW     Appearance CLEAR  CLEAR     Specific Gravity, Urine 1.025  1.005 - 1.030     pH 6.5  5.0 - 8.0      Glucose, UA NEGATIVE  NEGATIVE (mg/dL)    Hgb urine dipstick MODERATE (*) NEGATIVE     Bilirubin Urine NEGATIVE  NEGATIVE     Ketones, ur NEGATIVE  NEGATIVE (mg/dL)    Protein, ur TRACE (*) NEGATIVE (mg/dL)    Urobilinogen, UA 1.0  0.0 - 1.0 (mg/dL)    Nitrite NEGATIVE  NEGATIVE     Leukocytes, UA NEGATIVE  NEGATIVE    URINE MICROSCOPIC-ADD ON     Status: Abnormal   Collection Time   08/01/11  7:58 PM      Component Value Range Comment   Squamous Epithelial / LPF MANY (*) RARE     WBC, UA 0-2  <3 (WBC/hpf)    RBC / HPF 3-6  <3 (RBC/hpf)    Bacteria, UA RARE  RARE     No results found.    Assessment Present on Admission:   .Acute respiratory failure .Obesity hypoventilation syndrome .Cellulitis of leg .Obesity, morbid (more than 100 lbs over ideal weight or BMI > 40) .Bipolar affect, depressed  Seborrheic eczema   PLAN: We'll bring this lady on observation for safety because of her hypoxia with pain medications and treat her with trauma bowel to see if this can control her pain without causing hypoxia. She will likely need a sleep study as an outpatient and be fitted with CPAP or BiPAP for her obstructive sleep apnea/pickwickian syndrome. Will consult a pulmonologist for suggestions.  She will need a weight-loss program and possible consideration for bariatric  surgery   Vancomycin for her cellulitis  Continue her psychotropic medications  Nizoral shampoo for her seborrhea; check HIV status  Other plans as per orders.   Fleet Higham 08/01/2011, 11:53 PM

## 2011-08-01 NOTE — ED Notes (Signed)
Pt states pain to left hip, leg, and ankle that increased in severity last night.  Swelling and redness to left leg noted.  Pt states throbbing pain that intermittently feels like a "wringing and twisting" in her leg.

## 2011-08-02 ENCOUNTER — Observation Stay (HOSPITAL_COMMUNITY): Payer: Medicare Other

## 2011-08-02 ENCOUNTER — Encounter (HOSPITAL_COMMUNITY): Payer: Self-pay

## 2011-08-02 LAB — CBC
MCV: 95.9 fL (ref 78.0–100.0)
Platelets: 216 10*3/uL (ref 150–400)
RBC: 4.89 MIL/uL (ref 3.87–5.11)
WBC: 8.3 10*3/uL (ref 4.0–10.5)

## 2011-08-02 LAB — TSH: TSH: 2.397 u[IU]/mL (ref 0.350–4.500)

## 2011-08-02 LAB — BASIC METABOLIC PANEL
CO2: 34 mEq/L — ABNORMAL HIGH (ref 19–32)
Calcium: 8.6 mg/dL (ref 8.4–10.5)
GFR calc Af Amer: >90 mL/min (ref >90)
Sodium: 136 mEq/L (ref 135–145)

## 2011-08-02 LAB — HEPATIC FUNCTION PANEL
ALT: 24 U/L (ref 0–35)
Bilirubin, Direct: <0.1 mg/dL (ref 0.0–0.3)
Total Protein: 7.1 g/dL (ref 6.0–8.3)

## 2011-08-02 LAB — RAPID HIV SCREEN (WH-MAU): Rapid HIV Screen: NONREACTIVE

## 2011-08-02 LAB — PROTIME-INR
INR: 0.92 (ref 0.00–1.49)
Prothrombin Time: 12.6 seconds (ref 11.6–15.2)

## 2011-08-02 LAB — MAGNESIUM: Magnesium: 1.9 mg/dL (ref 1.5–2.5)

## 2011-08-02 MED ORDER — VANCOMYCIN HCL IN DEXTROSE 1-5 GM/200ML-% IV SOLN: 1000.0000 mg | Freq: Once | INTRAVENOUS | Status: DC

## 2011-08-02 MED ORDER — VANCOMYCIN HCL IN DEXTROSE 1-5 GM/200ML-% IV SOLN
INTRAVENOUS | Status: AC
  Filled 2011-08-02: qty 400

## 2011-08-02 MED ORDER — SPIRONOLACTONE 25 MG PO TABS
50.0000 mg | ORAL_TABLET | ORAL | Status: DC
  Administered 2011-08-02: 50 mg via ORAL
  Filled 2011-08-02: qty 2

## 2011-08-02 MED ORDER — ALBUTEROL SULFATE (5 MG/ML) 0.5% IN NEBU: 2.5000 mg | INHALATION_SOLUTION | Freq: Four times a day (QID) | RESPIRATORY_TRACT | Status: DC | PRN

## 2011-08-02 MED ORDER — ENOXAPARIN SODIUM 40 MG/0.4ML ~~LOC~~ SOLN
40.0000 mg | SUBCUTANEOUS | Status: DC
  Administered 2011-08-02: 40 mg via SUBCUTANEOUS
  Filled 2011-08-02: qty 0.4

## 2011-08-02 MED ORDER — BIOTENE DRY MOUTH MT LIQD
15.0000 mL | Freq: Two times a day (BID) | OROMUCOSAL | Status: DC
  Administered 2011-08-02 (×2): 15 mL via OROMUCOSAL

## 2011-08-02 MED ORDER — DESVENLAFAXINE SUCCINATE ER 50 MG PO TB24
50.0000 mg | ORAL_TABLET | ORAL | Status: DC
  Administered 2011-08-02: 50 mg via ORAL
  Filled 2011-08-02 (×3): qty 1

## 2011-08-02 MED ORDER — ASPIRIN EC 81 MG PO TBEC
81.0000 mg | DELAYED_RELEASE_TABLET | Freq: Every day | ORAL | Status: DC
  Administered 2011-08-02: 81 mg via ORAL
  Filled 2011-08-02: qty 1

## 2011-08-02 MED ORDER — ASPIRIN 81 MG PO TBEC: 81.0000 mg | DELAYED_RELEASE_TABLET | Freq: Every day | ORAL | Status: AC

## 2011-08-02 MED ORDER — ACETAMINOPHEN 325 MG PO TABS
650.0000 mg | ORAL_TABLET | Freq: Four times a day (QID) | ORAL | Status: DC | PRN
  Administered 2011-08-02: 650 mg via ORAL
  Filled 2011-08-02: qty 2

## 2011-08-02 MED ORDER — FLEET ENEMA 7-19 GM/118ML RE ENEM: 1.0000 | ENEMA | RECTAL | Status: DC | PRN

## 2011-08-02 MED ORDER — DOCUSATE SODIUM 100 MG PO CAPS: 100.0000 mg | ORAL_CAPSULE | Freq: Two times a day (BID) | ORAL | Status: DC | PRN

## 2011-08-02 MED ORDER — SENNOSIDES-DOCUSATE SODIUM 8.6-50 MG PO TABS: 2.0000 | ORAL_TABLET | Freq: Every day | ORAL | Status: DC | PRN

## 2011-08-02 MED ORDER — SULFAMETHOXAZOLE-TMP DS 800-160 MG PO TABS: 1.0000 | ORAL_TABLET | Freq: Two times a day (BID) | ORAL | Status: AC

## 2011-08-02 MED ORDER — TRAMADOL HCL 50 MG PO TABS: 50.0000 mg | ORAL_TABLET | Freq: Four times a day (QID) | ORAL | Status: DC | PRN

## 2011-08-02 MED ORDER — ONDANSETRON HCL 4 MG/2ML IJ SOLN: 4.0000 mg | Freq: Four times a day (QID) | INTRAMUSCULAR | Status: DC | PRN

## 2011-08-02 MED ORDER — LORATADINE 10 MG PO TABS
10.0000 mg | ORAL_TABLET | ORAL | Status: DC
  Administered 2011-08-02: 10 mg via ORAL
  Filled 2011-08-02: qty 1

## 2011-08-02 MED ORDER — POLYETHYLENE GLYCOL 3350 17 G PO PACK: 17.0000 g | PACK | Freq: Every day | ORAL | Status: DC | PRN

## 2011-08-02 MED ORDER — TRAZODONE HCL 50 MG PO TABS: 100.0000 mg | ORAL_TABLET | Freq: Every day | ORAL | Status: DC

## 2011-08-02 MED ORDER — ACETAMINOPHEN 650 MG RE SUPP: 650.0000 mg | Freq: Four times a day (QID) | RECTAL | Status: DC | PRN

## 2011-08-02 MED ORDER — VANCOMYCIN HCL 1000 MG IV SOLR
1500.0000 mg | Freq: Two times a day (BID) | INTRAVENOUS | Status: DC
  Filled 2011-08-02 (×4): qty 1500

## 2011-08-02 MED ORDER — VANCOMYCIN HCL IN DEXTROSE 1-5 GM/200ML-% IV SOLN
1000.0000 mg | Freq: Once | INTRAVENOUS | Status: AC
  Administered 2011-08-02: 1000 mg via INTRAVENOUS
  Filled 2011-08-02: qty 200

## 2011-08-02 MED ORDER — HYDROCODONE-ACETAMINOPHEN 5-500 MG PO TABS: 1.0000 | ORAL_TABLET | Freq: Three times a day (TID) | ORAL | Status: DC | PRN

## 2011-08-02 MED ORDER — BISACODYL 10 MG RE SUPP: 10.0000 mg | RECTAL | Status: DC | PRN

## 2011-08-02 MED ORDER — BUDESONIDE-FORMOTEROL FUMARATE 160-4.5 MCG/ACT IN AERO
2.0000 | INHALATION_SPRAY | Freq: Two times a day (BID) | RESPIRATORY_TRACT | Status: DC
  Administered 2011-08-02: 2 via RESPIRATORY_TRACT
  Filled 2011-08-02: qty 6

## 2011-08-02 MED ORDER — FUROSEMIDE 40 MG PO TABS
40.0000 mg | ORAL_TABLET | ORAL | Status: DC
Start: 2011-08-02 — End: 2011-08-02
  Administered 2011-08-02: 40 mg via ORAL
  Filled 2011-08-02: qty 1

## 2011-08-02 MED ORDER — ONDANSETRON HCL 4 MG PO TABS: 4.0000 mg | ORAL_TABLET | ORAL | Status: DC | PRN

## 2011-08-02 MED ORDER — LORATADINE 10 MG PO TABS: 10.0000 mg | ORAL_TABLET | ORAL | Status: DC

## 2011-08-02 MED ORDER — ALBUTEROL SULFATE HFA 108 (90 BASE) MCG/ACT IN AERS
2.0000 | INHALATION_SPRAY | Freq: Two times a day (BID) | RESPIRATORY_TRACT | Status: DC
  Administered 2011-08-02: 2 via RESPIRATORY_TRACT
  Filled 2011-08-02: qty 6.7

## 2011-08-02 MED ORDER — PNEUMOCOCCAL VAC POLYVALENT 25 MCG/0.5ML IJ INJ
0.5000 mL | INJECTION | INTRAMUSCULAR | Status: DC
  Filled 2011-08-02: qty 0.5

## 2011-08-02 MED ORDER — ARIPIPRAZOLE 2 MG PO TABS
2.0000 mg | ORAL_TABLET | ORAL | Status: DC
  Administered 2011-08-02: 2 mg via ORAL
  Filled 2011-08-02 (×3): qty 1

## 2011-08-02 MED ORDER — INFLUENZA VIRUS VACC SPLIT PF IM SUSP
0.5000 mL | INTRAMUSCULAR | Status: DC
  Filled 2011-08-02: qty 0.5

## 2011-08-02 MED ORDER — KETOCONAZOLE 2 % EX SHAM
MEDICATED_SHAMPOO | CUTANEOUS | Status: DC
  Filled 2011-08-02: qty 120

## 2011-08-02 MED ORDER — SULFAMETHOXAZOLE-TMP DS 800-160 MG PO TABS
1.0000 | ORAL_TABLET | Freq: Two times a day (BID) | ORAL | Status: DC
  Administered 2011-08-02: 1 via ORAL
  Filled 2011-08-02: qty 1

## 2011-08-02 MED ORDER — ENOXAPARIN SODIUM 100 MG/ML ~~LOC~~ SOLN
100.0000 mg | Freq: Every day | SUBCUTANEOUS | Status: DC
  Filled 2011-08-02 (×2): qty 1

## 2011-08-02 NOTE — Discharge Summary (Signed)
Aimee Phillips MRN: [id number] DOB/AGE: [birth year] 40 y.o.  Admit date: 08/01/2011 Discharge date: 08/02/2011  Primary Care Physician:  Aniceto Boss, PA   Discharge Diagnoses:   No resolved problems to display.  Active Hospital Problems  Diagnoses Date Noted   . Acute respiratory failure  08/01/2011   . Obesity, morbid (more than 100 lbs over ideal weight or BMI > 40) 08/01/2011   . Obesity hypoventilation syndrome 08/01/2011   . Cellulitis of leg 08/01/2011   . Bipolar affect, depressed      Resolved Hospital Problems  Diagnoses Date Noted Date Resolved     DISCHARGE MEDICATION: Current Discharge Medication List    CONTINUE these medications which have NOT CHANGED   Details  albuterol (PROAIR HFA) 108 (90 BASE) MCG/ACT inhaler Inhale 2 puffs into the lungs 2 (two) times daily.      ARIPiprazole (ABILIFY) 2 MG tablet Take 2 mg by mouth every morning.     budesonide-formoterol (SYMBICORT) 160-4.5 MCG/ACT inhaler Inhale 2 puffs into the lungs 2 (two) times daily.      desvenlafaxine (PRISTIQ) 50 MG 24 hr tablet Take 50 mg by mouth every morning.     furosemide (LASIX) 20 MG tablet Take 40 mg by mouth every morning.     HYDROcodone-acetaminophen (VICODIN) 5-500 MG per tablet Take 1 tablet by mouth 3 (three) times daily as needed. For pain    loratadine (CLARITIN) 10 MG tablet Take 10 mg by mouth every morning.      naproxen (NAPROSYN) 250 MG tablet Take 500 mg by mouth 2 (two) times daily with a meal.      OVER THE COUNTER MEDICATION Take 2 capsules by mouth 3 (three) times daily.      potassium chloride (KLOR-CON) 10 MEQ CR tablet Take 10 mEq by mouth every morning.      spironolactone (ALDACTONE) 50 MG tablet Take 50 mg by mouth every morning.     !! traMADol (ULTRAM) 50 MG tablet Take 50 mg by mouth at bedtime. Maximum dose= 8 tablets per day/ PRESCRIPTION STATES **Take one tablet by mouth 3 times daily for pain**     traZODone (DESYREL) 100 MG tablet Take 100  mg by mouth at bedtime.       !! - Potential duplicate medications found. Please discuss with provider.      Consultants: Dr. Kari Baars  CARDIAC CATH & OTHER PROCEDURES: Doppler of lower extremities: Negative for DVT  No results found for this or any previous visit (from the past 240 hour(s)).  BRIEF ADMITTING H & P: Aimee Phillips is an 40 y.o. morbidly obese Caucasian female. Ends to the emergency room because of recent onset of pain and redness of the left leg. Denies history of trauma, denies sick contacts. He should was treated in the emergency room and was being prepared to be discharged home on oral antibiotics for cellulitis, then it was noted that her saturations were in the low 80s after having received a dose of Dilaudid for pain. Patient gives a history of easy daytime somnolence, and frequent waking at night. As of her body habitus she is presumed to have chronic hypoxia is considered unsafe to be discharged on the hospitalist service was called to assist with management.  She has marked dyspnea on exertion and it was extremely self-conscious about her weight and would love assistance with weight loss.  Patient reports that she has been told in the past that she likely has obstructive sleep apnea, but the  PA she sees in [place], has consistently ignored her a request for referral.  He gives her last known weight as 482 pounds and her height is 5 feet 9 inches.  Physical Exam:  Filed Vitals:    08/01/11 1741  08/01/11 2025  08/01/11 2026   BP:  137/70  113/70    Pulse:  99  99    Temp:  98.3 F (36.8 C)  98.3 F (36.8 C)    TempSrc:  Oral  Oral    Resp:  24  24    Height:  5\' 9"  (1.753 m)     Weight:  194.14 kg (428 lb)     SpO2:  99%  77%  92%    Blood pressure 113/70, pulse 99, temperature 98.3 F (36.8 C), temperature source Oral, resp. rate 24, height 5\' 9"  (1.753 m), weight 194.14 kg (428 lb), last menstrual period 07/31/2011, SpO2 92.00%.  GEN: Morbidly  obese depressed looking Caucasian lady sitting up in the stretcher complaining of pain in the leg; cooperative with exam  PSYCH: alert and oriented x4; appears depressed; affect is normal  HEENT: Mucous membranes pink and anicteric; PERRLA; EOM intact; no cervical lymphadenopathy nor thyromegaly or carotid bruit; no JVD; seborrheic eczema of scalp and face  Breasts:: Not examined  CHEST WALL: No tenderness  CHEST: Distant heart sounds but Normal respiration, clear to auscultation bilaterally  HEART: Regular rate and rhythm; no murmurs rubs or gallops  BACK: No kyphosis or scoliosis; no CVA tenderness  ABDOMEN: Morbidly obese Obese, soft non-tender; no masses, no organomegaly, normal abdominal bowel sounds; huge pannus erythema and striae of the lower abdomen; no intertriginous candida.  Rectal Exam: Not done  EXTREMITIES: No bone or joint deformity; age-appropriate arthropathy of the hands and knees; trace bilateral edema; no ulcerations. Erythema of the right leg from ankle to knee. Genitalia: not examined  PULSES: 2+ and symmetric  SKIN: Other than noted above Normal hydration no rash or ulceration  CNS: Cranial nerves 2-12 grossly intact no focal neurologic deficit   Active Hospital Problems  Diagnoses Date Noted   . Acute respiratory failure:  This is probably secondary to narcotic use (Dilaudid). At this time the patient is satting 97% 4 L. Her saturations on ambulation dropped. So she'll be sent home oxygen. She is denies shortness of breath. We'll need a sleep study as an outpatient. The patient probably has obesity hypoventilation syndrome. Dr. Kari Baars was consulted and he agreed with sleep study and home O2 for possible hyperventilation syndrome or  08/01/2011   . Obesity, morbid (more than 100 lbs over ideal weight or BMI > 40): Counseling was done. We'll need to be read reevaluate as an outpatient. Serious counseling should be done with this patient. As she is morbidly obese.  Which is probably contributing to have possible hyperventilation syndrome.  08/01/2011   . Obesity hypoventilation syndrome: Will need further evaluation as an outpatient. Patient ambulates with our oxygen and her saturations dropped to 84% on room air. We was sent home on home oxygen. 08/01/2011   . Cellulitis of leg: Her oral antibiotics were stopped. She was started on vancomycin. Her redness decreased significantly with vancomycin. Her redness is much improved. She relates her leg pain is improved 2. We'll continue her on Bactrim for a total of 7 days. Doppler of her lower extremity negative to  08/01/2011   . Bipolar affect, depressed: Stable.       Resolved Hospital Problems  Diagnoses Date Noted  Date Resolved     Disposition and Follow-up:  -Patient will need a followup with her primary care Dr. here will see how her cellulitis is doing. -Patient will need a sleep study as an outpatient. The patient probably has obstructive sleep apnea.     DISCHARGE EXAM:  Filed Vitals:    08/01/11 2026  08/01/11 2226  08/02/11 0050  08/02/11 0538   BP:    147/76  133/77   Pulse:    103  109   Temp:    98.1 F (36.7 C)  97.7 F (36.5 C)   TempSrc:    Oral  Oral   Resp:    24  22   Height:    5\' 9"  (1.753 m)    Weight:    221.6 kg (488 lb 8.6 oz)  221.3 kg (487 lb 14.1 oz)   SpO2:  92%  94%  94%  97%    Weight change:   Intake/Output Summary (Last 24 hours) at 08/02/11 0748 Last data filed at 08/02/11 0539   Gross per 24 hour   Intake  640 ml   Output  600 ml   Net  40 ml    General: Alert, awake, oriented x3, in no acute distress. Morbidly obese female  HEENT: No bruits, no goiter.  Heart: Regular rate and rhythm, without murmurs, rubs, gallops.  Lungs: Moderate air movement and clear to auscultation.  Abdomen: Soft, nontender, nondistended, positive bowel sounds.  Neuro: Grossly intact, nonfocal.  Extremities: The left leg is a little bit red and warm to touch. No cuts or  sores. Not painful to palpation Hoffmann sign negative.      Blood pressure 133/77, pulse 109, temperature 97.7 F (36.5 C), temperature source Oral, resp. rate 22, height 5\' 9"  (1.753 m), weight 221.3 kg (487 lb 14.1 oz), last menstrual period 07/31/2011, SpO2 95.00%.   Basename 08/02/11 0527 08/02/11 0100 08/01/11 1941  NA 136 -- 137  K 4.1 -- 3.9  CL 95* -- 97  CO2 34* -- 35*  GLUCOSE 136* -- 117*  BUN 11 -- 14  CREATININE 0.82 -- 0.80  CALCIUM 8.6 -- 9.0  MG -- 1.9 --  PHOS -- -- --    Basename 08/02/11 0100  AST 33  ALT 24  ALKPHOS 83  BILITOT 0.1*  PROT 7.1  ALBUMIN 3.4*   No results found for this basename: LIPASE:2,AMYLASE:2 in the last 72 hours  Basename 08/02/11 0527 08/01/11 1941  WBC 8.3 9.3  NEUTROABS -- 6.3  HGB 13.4 13.5  HCT 46.9* 45.5  MCV 95.9 93.4  PLT 216 214    Signed: Marinda Elk M.D. 08/02/2011, 8:06 AM

## 2011-08-02 NOTE — Progress Notes (Signed)
UR Chart Review Completed  

## 2011-08-02 NOTE — Progress Notes (Addendum)
ANTIBIOTIC CONSULT NOTE - INITIAL  Pharmacy Consult for Vancomycin Indication: cellulitis   No Known Allergies  Patient Measurements: Height: 5\' 9"  (175.3 cm) Weight: 487 lb 14.1 oz (221.3 kg) IBW/kg (Calculated) : 66.2   Vital Signs: Temp: 97.7 F (36.5 C) (11/21 0538) Temp src: Oral (11/21 0538) BP: 133/77 mmHg (11/21 0538) Pulse Rate: 109  (11/21 0538) Intake/Output from previous day: 11/20 0701 - 11/21 0700 In: 640 [P.O.:240; IV Piggyback:400] Out: 600 [Urine:600] Intake/Output from this shift:    Labs:  [facility] 08/02/11 0527 08/01/11 1941  WBC 8.3 9.3  HGB 13.4 13.5  PLT 216 214  LABCREA -- --  CREATININE 0.82 0.80   Estimated Creatinine Clearance: 186.4 ml/min (by C-G formula based on Cr of 0.82). No results found for this basename: VANCOTROUGH:2,VANCOPEAK:2,VANCORANDOM:2,GENTTROUGH:2,GENTPEAK:2,GENTRANDOM:2,TOBRATROUGH:2,TOBRAPEAK:2,TOBRARND:2,AMIKACINPEAK:2,AMIKACINTROU:2,AMIKACIN:2, in the last 72 hours   Microbiology: No results found for this or any previous visit (from the past 720 hour(s)).  Medical History: Past Medical History  Diagnosis Date  . COPD (chronic obstructive pulmonary disease)   . Depression   . Bipolar affect, depressed   . Asthma     Medications:  Anti-infectives     Start     Dose/Rate Route Frequency Ordered Stop   08/02/11 0230   vancomycin (VANCOCIN) IVPB 1000 mg/200 mL premix        1,000 mg 200 mL/hr over 60 Minutes Intravenous  Once 08/02/11 0129 08/02/11 0421   08/02/11 0145   vancomycin (VANCOCIN) IVPB 1000 mg/200 mL premix        1,000 mg 200 mL/hr over 60 Minutes Intravenous  Once 08/02/11 0136 08/02/11 0302   08/02/11 0130   vancomycin (VANCOCIN) IVPB 1000 mg/200 mL premix  Status:  Discontinued        1,000 mg 200 mL/hr over 60 Minutes Intravenous  Once 08/02/11 0130 08/02/11 0133   08/01/11 1915   ceFAZolin (ANCEF) IVPB 1 g/50 mL premix        1 g 100 mL/hr over 30 Minutes Intravenous  Once 08/01/11 1905  08/01/11 2038         Assessment: Okay for Protocol Received a total of 2 grams IV Vancomycin last night.  Goal of Therapy:  Vancomycin trough level 10-15 mcg/ml  Plan:  Measure antibiotic drug levels at steady state Vancomycin 1500mg  IV every 12 hours. Trough at steady state.  Aimee Phillips 08/02/2011,7:20 AM  Also adjusted prophylaxis lovenox dose for obesity.  Aimee Phillips 08/02/2011 10:13 AM

## 2011-08-02 NOTE — Progress Notes (Signed)
Subjective: Patient relates her leg pain is much improved. She has required only minimal pain medication. She also relates that her swelling is better and her redness is improved.  Objective: Filed Vitals:   08/01/11 2026 08/01/11 2226 08/02/11 0050 08/02/11 0538  BP:   147/76 133/77  Pulse:   103 109  Temp:   98.1 F (36.7 C) 97.7 F (36.5 C)  TempSrc:   Oral Oral  Resp:   24 22  Height:   5\' 9"  (1.753 m)   Weight:   221.6 kg (488 lb 8.6 oz) 221.3 kg (487 lb 14.1 oz)  SpO2: 92% 94% 94% 97%   Weight change:   Intake/Output Summary (Last 24 hours) at 08/02/11 0748 Last data filed at 08/02/11 0539  Gross per 24 hour  Intake    640 ml  Output    600 ml  Net     40 ml    General: Alert, awake, oriented x3, in no acute distress. Morbidly obese female HEENT: No bruits, no goiter.  Heart: Regular rate and rhythm, without murmurs, rubs, gallops.  Lungs: Moderate air movement and clear to auscultation. Abdomen: Soft, nontender, nondistended, positive bowel sounds.  Neuro: Grossly intact, nonfocal. Extremities: The left leg is a little bit red and warm to touch. No cuts or sores. Not painful to palpation Hoffmann sign negative.  Lab Results:  Basename 08/02/11 0527 08/02/11 0100 08/01/11 1941  NA 136 -- 137  K 4.1 -- 3.9  CL 95* -- 97  CO2 34* -- 35*  GLUCOSE 136* -- 117*  BUN 11 -- 14  CREATININE 0.82 -- 0.80  CALCIUM 8.6 -- 9.0  MG -- 1.9 --  PHOS -- -- --    Basename 08/02/11 0100  AST 33  ALT 24  ALKPHOS 83  BILITOT 0.1*  PROT 7.1  ALBUMIN 3.4*   No results found for this basename: LIPASE:2,AMYLASE:2 in the last 72 hours  Basename 08/02/11 0527 08/01/11 1941  WBC 8.3 9.3  NEUTROABS -- 6.3  HGB 13.4 13.5  HCT 46.9* 45.5  MCV 95.9 93.4  PLT 216 214   No results found for this basename: CKTOTAL:3,CKMB:3,CKMBINDEX:3,TROPONINI:3 in the last 72 hours No results found for this basename: POCBNP:3 in the last 72 hours No results found for this basename: DDIMER:2  in the last 72 hours No results found for this basename: HGBA1C:2 in the last 72 hours No results found for this basename: CHOL:2,HDL:2,LDLCALC:2,TRIG:2,CHOLHDL:2,LDLDIRECT:2 in the last 72 hours No results found for this basename: TSH,T4TOTAL,FREET3,T3FREE,THYROIDAB in the last 72 hours No results found for this basename: VITAMINB12:2,FOLATE:2,FERRITIN:2,TIBC:2,IRON:2,RETICCTPCT:2 in the last 72 hours  Micro Results: No results found for this or any previous visit (from the past 240 hour(s)).  Studies/Results: No results found.  Medications: I have reviewed the patient's current medications.   Patient Active Hospital Problem List: 1.Acute respiratory failure (08/01/2011)  resolved probably secondary to narcotics. The patient is morbidly obese probably also has obesity hypoventilation syndrome and obstructive sleep apnea. Recommend to her PCP to get a sleep study. As her left leg is swollen. We'll get a Doppler if negative can be discharged home.   2.Obesity, morbid (more than 100 lbs over ideal weight or BMI > 40) (08/01/2011)  counseling on weight loss.   3.Obesity hypoventilation syndrome (08/01/2011)  we'll ambulate. His saturations are low we'll have to go home on oxygen. We'll need a sleep study as an outpatient.   4.Cellulitis of leg (08/01/2011)  get a Doppler to rule out DVT. Continue  Ancef and changed to Keflex by mouth as an outpatient.  Bipolar affect, depressed ()  stable.     LOS: 1 day   Marinda Elk M.D. Pager: [phone] [facility] 08/02/2011, 7:48 AM

## 2011-08-02 NOTE — Consult Note (Signed)
Consult requested by: Hospitalist attending Consult requested for respiratory failure:  HPI: This is a 40 year old morbidly obese Caucasian female who came to the emergency room because of pain and swelling and redness of her left leg. She has been treated for cellulitis but as she was being sent home it was noted that she had markedly low oxygen saturation. She says she has been told that she has sleep apnea but she has not had a sleep study done because she says that she has a 52-year-old child who will not sleep with anyone but her so she has not been able to have the study done. She says she hopes that the child is better with sleeping with someone else now and she can get this study done. Her history is very consistent with obesity hypoventilation and sleep apnea.  she fell asleep while I was interviewing her. She says that she snores. She says that people have said that she stops breathing during sleep.  Past Medical History  Diagnosis Date  . COPD (chronic obstructive pulmonary disease)   . Depression   . Bipolar affect, depressed   . Asthma      Family History  Problem Relation Age of Onset  . Cancer Mother     breast  . Heart failure Father     died of AMI     History   Social History  . Marital Status: Married    Spouse Name: N/A    Number of Children: N/A  . Years of Education: N/A   Social History Main Topics  . Smoking status: Never Smoker   . Smokeless tobacco: Never Used  . Alcohol Use: No  . Drug Use: No  . Sexually Active:    Other Topics Concern  . None   Social History Narrative  . None     ROS: She denies any chest pain. She has not had any cough or congestion.    Objective: Vital signs in last 24 hours: Temp:  [97.7 F (36.5 C)-98.3 F (36.8 C)] 97.7 F (36.5 C) (11/21 0538) Pulse Rate:  [99-109] 109  (11/21 0538) Resp:  [22-24] 22  (11/21 0538) BP: (113-147)/(70-77) 133/77 mmHg (11/21 0538) SpO2:  [77 %-99 %] 95 % (11/21 0746) Weight:   [194.14 kg (428 lb)-221.6 kg (488 lb 8.6 oz)] 487 lb 14.1 oz (221.3 kg) (11/21 0538) Weight change:  Last BM Date: 08/01/11  Intake/Output from previous day: 11/20 0701 - 11/21 0700 In: 640 [P.O.:240; IV Piggyback:400] Out: 600 [Urine:600]  PHYSICAL EXAM She is morbidly obese. She is sleepy. Her chest is fairly clear with decreased breath sounds. Her heart sounds are distant. Her abdomen is soft morbidly obese with no masses. Her extreme he showed she does have some erythema of her left leg.  Lab Results: Basic Metabolic Panel:  Basename 08/02/11 0527 08/02/11 0100 08/01/11 1941  NA 136 -- 137  K 4.1 -- 3.9  CL 95* -- 97  CO2 34* -- 35*  GLUCOSE 136* -- 117*  BUN 11 -- 14  CREATININE 0.82 -- 0.80  CALCIUM 8.6 -- 9.0  MG -- 1.9 --  PHOS -- -- --   Liver Function Tests:  Basename 08/02/11 0100  AST 33  ALT 24  ALKPHOS 83  BILITOT 0.1*  PROT 7.1  ALBUMIN 3.4*   No results found for this basename: LIPASE:2,AMYLASE:2 in the last 72 hours No results found for this basename: AMMONIA:2 in the last 72 hours CBC:  Basename 08/02/11 0527 08/01/11 1941  WBC 8.3 9.3  NEUTROABS -- 6.3  HGB 13.4 13.5  HCT 46.9* 45.5  MCV 95.9 93.4  PLT 216 214   Cardiac Enzymes: No results found for this basename: CKTOTAL:3,CKMB:3,CKMBINDEX:3,TROPONINI:3 in the last 72 hours BNP: No results found for this basename: POCBNP:3 in the last 72 hours D-Dimer: No results found for this basename: DDIMER:2 in the last 72 hours CBG: No results found for this basename: GLUCAP:6 in the last 72 hours Hemoglobin A1C: No results found for this basename: HGBA1C in the last 72 hours Fasting Lipid Panel: No results found for this basename: CHOL,HDL,LDLCALC,TRIG,CHOLHDL,LDLDIRECT in the last 72 hours Thyroid Function Tests: No results found for this basename: TSH,T4TOTAL,FREET4,T3FREE,THYROIDAB in the last 72 hours Anemia Panel: No results found for this basename:  VITAMINB12,FOLATE,FERRITIN,TIBC,IRON,RETICCTPCT in the last 72 hours Coagulation:  Basename 08/02/11 0100  LABPROT 12.6  INR 0.92   Urine Drug Screen:  Alcohol Level: No results found for this basename: ETH:2 in the last 72 hours Urinalysis:  Misc. Labs:   ABGS: No results found for this basename: PHART,PCO2,PO2ART,TCO2,HCO3 in the last 72 hours   MICROBIOLOGY: No results found for this or any previous visit (from the past 240 hour(s)).  Studies/Results: No results found.  Medications:  Scheduled:   . sodium chloride   Intravenous STAT  . albuterol  2 puff Inhalation BID  . antiseptic oral rinse  15 mL Mouth Rinse BID  . ARIPiprazole  2 mg Oral QAM  . aspirin EC  81 mg Oral Daily  . budesonide-formoterol  2 puff Inhalation BID  . ceFAZolin (ANCEF) IV  1 g Intravenous Once  . desvenlafaxine  50 mg Oral QAM  . enoxaparin  40 mg Subcutaneous Q24H  . furosemide  40 mg Oral QAM  . HYDROmorphone  1 mg Intravenous Once  . influenza  inactive virus vaccine  0.5 mL Intramuscular Tomorrow-1000  . ketoconazole   Topical 2 times weekly  . loratadine  10 mg Oral QAM  . ondansetron (ZOFRAN) IV  4 mg Intravenous Once  . pneumococcal 23 valent vaccine  0.5 mL Intramuscular Tomorrow-1000  . spironolactone  50 mg Oral QAM  . sulfamethoxazole-trimethoprim  1 tablet Oral Q12H  . traZODone  100 mg Oral QHS  . vancomycin  1,500 mg Intravenous Q12H  . vancomycin  1,000 mg Intravenous Once  . vancomycin  1,000 mg Intravenous Once  . DISCONTD: loratadine  10 mg Oral QAM  . DISCONTD: vancomycin  1,000 mg Intravenous Once   Continuous:  RUE:[id number], acetaminophen, albuterol, bisacodyl, docusate sodium, ondansetron (ZOFRAN) IV, ondansetron, oxyCODONE-acetaminophen, polyethylene glycol, senna-docusate, sodium phosphate, DISCONTD: traMADol  Assesment: She has obesity hypoventilation syndrome and probably has sleep apnea. She has been said to have COPD. She has other emotional  problems. Active Problems:  Obesity, morbid (more than 100 lbs over ideal weight or BMI > 40)  Obesity hypoventilation syndrome  Bipolar affect, depressed    Plan: She definitely needs a sleep study. She may require BiPAP rather than simply CPAP because of her history of COPD. She may require oxygen at home now. She says she is willing to undergo sleep study now.    LOS: 1 day   Leighanna Kirn L 08/02/2011, 8:52 AM

## 2011-08-02 NOTE — Progress Notes (Signed)
Patient's oxygen saturation at rest with 02 at 3lpm via nasal cannular is 94%. Oxygen removed. Patient's oxygen saturation at rest on room air is 89%. Patient ambulated 20 feet without oxygen. Patient easily becomes short of breath and must rest. Patient's oxygen saturation during ambulation on room air  was 82%. Client remains at rest with oxygen saturations reading 88% on room air. Resumed supplemental oxygen at 3lpm via nasal cannula. Patient's oxygen saturations returns to 92%. MD notified of results. Client to be discharged home with home oxygen use.

## 2012-06-04 ENCOUNTER — Emergency Department (HOSPITAL_COMMUNITY): Payer: Medicare Other

## 2012-06-04 ENCOUNTER — Encounter (HOSPITAL_COMMUNITY): Payer: Self-pay

## 2012-06-04 ENCOUNTER — Emergency Department (HOSPITAL_COMMUNITY)
Admission: EM | Admit: 2012-06-04 | Discharge: 2012-06-04 | Disposition: A | Discharge status: Home / Self Care | Payer: Medicare Other | Attending: Emergency Medicine | Admitting: Emergency Medicine

## 2012-06-04 DIAGNOSIS — M7989 Other specified soft tissue disorders: Secondary | ICD-10-CM | POA: Insufficient documentation

## 2012-06-04 DIAGNOSIS — R079 Chest pain, unspecified: Secondary | ICD-10-CM

## 2012-06-04 DIAGNOSIS — Z9981 Dependence on supplemental oxygen: Secondary | ICD-10-CM | POA: Insufficient documentation

## 2012-06-04 DIAGNOSIS — R109 Unspecified abdominal pain: Secondary | ICD-10-CM

## 2012-06-04 DIAGNOSIS — Z79899 Other long term (current) drug therapy: Secondary | ICD-10-CM | POA: Insufficient documentation

## 2012-06-04 DIAGNOSIS — R10811 Right upper quadrant abdominal tenderness: Secondary | ICD-10-CM | POA: Insufficient documentation

## 2012-06-04 DIAGNOSIS — J4489 Other specified chronic obstructive pulmonary disease: Secondary | ICD-10-CM | POA: Insufficient documentation

## 2012-06-04 DIAGNOSIS — Z9089 Acquired absence of other organs: Secondary | ICD-10-CM | POA: Insufficient documentation

## 2012-06-04 DIAGNOSIS — J449 Chronic obstructive pulmonary disease, unspecified: Secondary | ICD-10-CM | POA: Insufficient documentation

## 2012-06-04 LAB — CBC WITH DIFFERENTIAL/PLATELET
Eosinophils Relative: 1 % (ref 0–5)
Hemoglobin: 13.3 g/dL (ref 12.0–15.0)
Lymphocytes Relative: 19 % (ref 12–46)
Lymphs Abs: 1.8 10*3/uL (ref 0.7–4.0)
MCV: 96.1 fL (ref 78.0–100.0)
Monocytes Relative: 8 % (ref 3–12)
Platelets: 257 10*3/uL (ref 150–400)
RBC: 4.61 MIL/uL (ref 3.87–5.11)
WBC: 9.7 10*3/uL (ref 4.0–10.5)

## 2012-06-04 LAB — COMPREHENSIVE METABOLIC PANEL
ALT: 26 U/L (ref 0–35)
Alkaline Phosphatase: 83 U/L (ref 39–117)
BUN: 11 mg/dL (ref 6–23)
CO2: 37 mEq/L — ABNORMAL HIGH (ref 19–32)
Calcium: 9.4 mg/dL (ref 8.4–10.5)
GFR calc Af Amer: >90 mL/min (ref >90)
GFR calc non Af Amer: >90 mL/min (ref >90)
Glucose, Bld: 142 mg/dL — ABNORMAL HIGH (ref 70–99)
Potassium: 4.3 mEq/L (ref 3.5–5.1)
Sodium: 137 mEq/L (ref 135–145)

## 2012-06-04 LAB — URINALYSIS, ROUTINE W REFLEX MICROSCOPIC
Bilirubin Urine: NEGATIVE
Hgb urine dipstick: NEGATIVE
Protein, ur: NEGATIVE mg/dL
Urobilinogen, UA: 0.2 mg/dL (ref 0.0–1.0)

## 2012-06-04 LAB — PREGNANCY, URINE: Preg Test, Ur: NEGATIVE

## 2012-06-04 MED ORDER — CYCLOBENZAPRINE HCL 10 MG PO TABS
10.0000 mg | ORAL_TABLET | Freq: Once | ORAL | Status: AC
  Administered 2012-06-04: 10 mg via ORAL
  Filled 2012-06-04: qty 1

## 2012-06-04 MED ORDER — OXYCODONE-ACETAMINOPHEN 5-325 MG PO TABS
2.0000 | ORAL_TABLET | Freq: Once | ORAL | Status: AC
  Administered 2012-06-04: 2 via ORAL
  Filled 2012-06-04: qty 2

## 2012-06-04 MED ORDER — AMOXICILLIN 500 MG PO CAPS: 500.0000 mg | ORAL_CAPSULE | Freq: Three times a day (TID) | ORAL | Status: DC

## 2012-06-04 MED ORDER — HYDROCODONE-ACETAMINOPHEN 5-325 MG PO TABS: 2.0000 | ORAL_TABLET | Freq: Four times a day (QID) | ORAL | Status: DC | PRN

## 2012-06-04 MED ORDER — CYCLOBENZAPRINE HCL 10 MG PO TABS: 10.0000 mg | ORAL_TABLET | Freq: Three times a day (TID) | ORAL | Status: DC | PRN

## 2012-06-04 NOTE — ED Notes (Signed)
Complain of pain in ribs for about three weeks
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
## 2013-09-05 ENCOUNTER — Encounter (HOSPITAL_COMMUNITY): Payer: Self-pay | Admitting: Emergency Medicine

## 2013-09-05 ENCOUNTER — Emergency Department (HOSPITAL_COMMUNITY)
Admission: EM | Admit: 2013-09-05 | Discharge: 2013-09-05 | Disposition: A | Discharge status: Home / Self Care | Payer: Medicare Other | Attending: Emergency Medicine | Admitting: Emergency Medicine

## 2013-09-05 DIAGNOSIS — Z86711 Personal history of pulmonary embolism: Secondary | ICD-10-CM | POA: Insufficient documentation

## 2013-09-05 DIAGNOSIS — L02419 Cutaneous abscess of limb, unspecified: Secondary | ICD-10-CM | POA: Insufficient documentation

## 2013-09-05 DIAGNOSIS — Z79899 Other long term (current) drug therapy: Secondary | ICD-10-CM | POA: Insufficient documentation

## 2013-09-05 DIAGNOSIS — F3130 Bipolar disorder, current episode depressed, mild or moderate severity, unspecified: Secondary | ICD-10-CM | POA: Insufficient documentation

## 2013-09-05 DIAGNOSIS — J449 Chronic obstructive pulmonary disease, unspecified: Secondary | ICD-10-CM | POA: Insufficient documentation

## 2013-09-05 DIAGNOSIS — J4489 Other specified chronic obstructive pulmonary disease: Secondary | ICD-10-CM | POA: Insufficient documentation

## 2013-09-05 DIAGNOSIS — L0390 Cellulitis, unspecified: Secondary | ICD-10-CM

## 2013-09-05 DIAGNOSIS — IMO0002 Reserved for concepts with insufficient information to code with codable children: Secondary | ICD-10-CM | POA: Insufficient documentation

## 2013-09-05 HISTORY — DX: Chronic pulmonary edema: J81.1

## 2013-09-05 HISTORY — DX: Cellulitis, unspecified: L03.90

## 2013-09-05 HISTORY — DX: Other pulmonary embolism without acute cor pulmonale: I26.99

## 2013-09-05 LAB — BASIC METABOLIC PANEL
GFR calc Af Amer: >90 mL/min (ref >90)
GFR calc non Af Amer: >90 mL/min (ref >90)
Potassium: 3.6 mEq/L (ref 3.5–5.1)
Sodium: 132 mEq/L — ABNORMAL LOW (ref 135–145)

## 2013-09-05 LAB — CBC WITH DIFFERENTIAL/PLATELET
Basophils Relative: 0 % (ref 0–1)
Eosinophils Absolute: 0.2 10*3/uL (ref 0.0–0.7)
MCH: 29.8 pg (ref 26.0–34.0)
MCHC: 30.7 g/dL (ref 30.0–36.0)
Neutrophils Relative %: 71 % (ref 43–77)
Platelets: 192 10*3/uL (ref 150–400)
RBC: 4.76 MIL/uL (ref 3.87–5.11)

## 2013-09-05 MED ORDER — MORPHINE SULFATE 4 MG/ML IJ SOLN
4.0000 mg | Freq: Once | INTRAMUSCULAR | Status: AC
  Administered 2013-09-05: 4 mg via INTRAVENOUS
  Filled 2013-09-05: qty 1

## 2013-09-05 MED ORDER — CLINDAMYCIN HCL 150 MG PO CAPS: 300.0000 mg | ORAL_CAPSULE | Freq: Four times a day (QID) | ORAL | Status: DC

## 2013-09-05 MED ORDER — CLINDAMYCIN PHOSPHATE 600 MG/50ML IV SOLN
600.0000 mg | Freq: Once | INTRAVENOUS | Status: AC
  Administered 2013-09-05: 600 mg via INTRAVENOUS
  Filled 2013-09-05: qty 50

## 2013-09-05 MED ORDER — ONDANSETRON HCL 4 MG/2ML IJ SOLN
4.0000 mg | Freq: Once | INTRAMUSCULAR | Status: AC
  Administered 2013-09-05: 4 mg via INTRAVENOUS
  Filled 2013-09-05: qty 2

## 2013-09-05 MED ORDER — HYDROCODONE-ACETAMINOPHEN 5-325 MG PO TABS: 2.0000 | ORAL_TABLET | ORAL | Status: DC | PRN

## 2013-09-05 NOTE — ED Provider Notes (Signed)
CSN: [id number]     Arrival date & time 09/05/13  1235 History   First MD Initiated Contact with Patient 09/05/13 1401     Chief Complaint  Patient presents with  . Leg Pain   (Consider location/radiation/quality/duration/timing/severity/associated sxs/prior Treatment) HPI Comments: Patient presents to ER for evaluation of pain and swelling of the left leg. Symptoms began 2 days ago. Patient reports that she has had a history of cellulitis in the leg and thinks that it is starting again. She reports that the area is warm to touch. She has not had a fever. There is no chest pain or shortness of breath. Patient denies injury to the area.  Patient is a 42 y.o. female presenting with leg pain.  Leg Pain Associated symptoms: no fever     Past Medical History  Diagnosis Date  . COPD (chronic obstructive pulmonary disease)   . Depression   . Bipolar affect, depressed   . Asthma   . Cellulitis   . Pulmonary edema   . PE (pulmonary embolism)    Past Surgical History  Procedure Laterality Date  . Cesarean section    . Cholecystectomy     Family History  Problem Relation Age of Onset  . Cancer Mother     breast  . Heart failure Father     died of AMI   History  Substance Use Topics  . Smoking status: Never Smoker   . Smokeless tobacco: Never Used  . Alcohol Use: No   OB History   Grav Para Term Preterm Abortions TAB SAB Ect Mult Living                 Review of Systems  Constitutional: Negative for fever.  Respiratory: Negative.   Cardiovascular: Negative.   Skin: Positive for color change.  All other systems reviewed and are negative.    Allergies  Review of patient's allergies indicates no known allergies.  Home Medications   Current Outpatient Rx  Name  Route  Sig  Dispense  Refill  . albuterol (PROAIR HFA) 108 (90 BASE) MCG/ACT inhaler   Inhalation   Inhale 2 puffs into the lungs 2 (two) times daily.           . ARIPiprazole (ABILIFY) 2 MG tablet  Oral   Take 2 mg by mouth every morning.          . budesonide-formoterol (SYMBICORT) 160-4.5 MCG/ACT inhaler   Inhalation   Inhale 2 puffs into the lungs 2 (two) times daily.           . cyclobenzaprine (FLEXERIL) 10 MG tablet   Oral   Take 1 tablet (10 mg total) by mouth 3 (three) times daily as needed for muscle spasms.   30 tablet   0   . desvenlafaxine (PRISTIQ) 50 MG 24 hr tablet   Oral   Take 50 mg by mouth every morning.          . diclofenac (CATAFLAM) 50 MG tablet   Oral   Take 1 tablet by mouth daily.         . furosemide (LASIX) 20 MG tablet   Oral   Take 40 mg by mouth every morning.          Marland Kitchen ipratropium-albuterol (DUONEB) 0.5-2.5 (3) MG/3ML SOLN   Nebulization   Take 3 mLs by nebulization every 6 (six) hours as needed.         . metFORMIN (GLUCOPHAGE) 850 MG tablet   Oral  Take 1 tablet by mouth 2 (two) times daily.         Marland Kitchen oxybutynin (DITROPAN XL) 15 MG 24 hr tablet   Oral   Take 1 tablet by mouth 2 (two) times daily.         Marland Kitchen oxyCODONE-acetaminophen (PERCOCET) 7.5-325 MG per tablet   Oral   Take 1 tablet by mouth every 6 (six) hours as needed.         . potassium chloride (KLOR-CON) 10 MEQ CR tablet   Oral   Take 10 mEq by mouth every morning.           Marland Kitchen spironolactone (ALDACTONE) 50 MG tablet   Oral   Take 50 mg by mouth every morning.          . traMADol (ULTRAM) 50 MG tablet   Oral   Take 50 mg by mouth every 6 (six) hours as needed. Pain         . traZODone (DESYREL) 100 MG tablet   Oral   Take 100 mg by mouth at bedtime.            BP 133/68  Pulse 94  Temp(Src) 98 F (36.7 C) (Oral)  Resp 20  Ht 5\' 2"  (1.575 m)  Wt 483 lb (219.087 kg)  BMI 88.32 kg/m2  SpO2 91% Physical Exam  Constitutional: She is oriented to person, place, and time. She appears well-developed and well-nourished. No distress.  HENT:  Head: Normocephalic and atraumatic.  Right Ear: Hearing normal.  Left Ear: Hearing normal.    Nose: Nose normal.  Mouth/Throat: Oropharynx is clear and moist and mucous membranes are normal.  Eyes: Conjunctivae and EOM are normal. Pupils are equal, round, and reactive to light.  Neck: Normal range of motion. Neck supple.  Cardiovascular: Regular rhythm, S1 normal and S2 normal.  Exam reveals no gallop and no friction rub.   No murmur heard. Pulmonary/Chest: Effort normal and breath sounds normal. No respiratory distress. She exhibits no tenderness.  Abdominal: Soft. Normal appearance and bowel sounds are normal. There is no hepatosplenomegaly. There is no tenderness. There is no rebound, no guarding, no tenderness at McBurney's point and negative Murphy's sign. No hernia.  Musculoskeletal: Normal range of motion.  Neurological: She is alert and oriented to person, place, and time. She has normal strength. No cranial nerve deficit or sensory deficit. Coordination normal. GCS eye subscore is 4. GCS verbal subscore is 5. GCS motor subscore is 6.  Skin: Skin is warm, dry and intact. No rash noted. No cyanosis.     Psychiatric: She has a normal mood and affect. Her speech is normal and behavior is normal. Thought content normal.    ED Course  Procedures (including critical care time) Labs Review Labs Reviewed  CBC WITH DIFFERENTIAL - Abnormal; Notable for the following:    HCT 46.3 (*)    RDW 15.7 (*)    All other components within normal limits  BASIC METABOLIC PANEL - Abnormal; Notable for the following:    Sodium 132 (*)    Chloride 89 (*)    CO2 35 (*)    Glucose, Bld 210 (*)    All other components within normal limits  CULTURE, BLOOD (ROUTINE X 2)  CULTURE, BLOOD (ROUTINE X 2)  D-DIMER, QUANTITATIVE   Imaging Review No results found.  EKG Interpretation   None       MDM  Diagnosis: Cellulitis  Patient presents to the ER for evaluation of erythema and  warmth of the left leg. Examination does show swelling as well as erythema, most likely consistent with  cellulitis. DVT was considered. Further discussion with patient reveals that she was recently hospitalized 2 more at [facility] with pulmonary edema. She says she thought she was diagnosed with a blood clot in her lung at that time. Records were obtained and it reveals that she had elevated d-dimer and PE was considered, but she could not get CT or V/Q because of claustrophobia and body weight. She was treated for the pulmonary edema and improved, and was therefore felt that she did not have a PE as she was not continued on any anticoagulants. Patient is not currently having any chest pain, shortness of breath. Her d-dimer is negative today. I feel that this is consistent with superficial cellulitis. Today is the patient's birthday, she could go home. She will therefore be treated with a dose of IV antibiotics here in the ER, continued antibiotic therapy. She is diabetic, glucose is about at her normal baseline which generally runs 150-200. She will come to the ER again if there is any increased redness, fever or worsening symptoms.  Gilda Crease, MD 09/05/13 [phone]

## 2013-09-05 NOTE — ED Notes (Signed)
Patient given discharge instruction, verbalized understand. IV removed, band aid applied. Patient wheelchair out of the department.  

## 2013-09-05 NOTE — ED Notes (Signed)
Pt with swelling to left leg, noted on 12/23, pt thinks she may have cellulitis to leg which per pt has hx of

## 2013-09-10 LAB — CULTURE, BLOOD (ROUTINE X 2)
Culture: NO GROWTH
Culture: NO GROWTH

## 2016-11-21 ENCOUNTER — Ambulatory Visit: Payer: Self-pay | Admitting: Physician Assistant

## 2016-11-24 ENCOUNTER — Telehealth: Payer: Self-pay

## 2016-12-21 ENCOUNTER — Other Ambulatory Visit: Payer: Self-pay | Admitting: Physician Assistant

## 2016-12-28 ENCOUNTER — Ambulatory Visit: Payer: Self-pay | Admitting: Physician Assistant

## 2017-01-19 ENCOUNTER — Ambulatory Visit: Payer: Self-pay | Admitting: Physician Assistant

## 2017-01-31 ENCOUNTER — Ambulatory Visit (INDEPENDENT_AMBULATORY_CARE_PROVIDER_SITE_OTHER): Payer: Medicare Other | Admitting: Physician Assistant

## 2017-01-31 ENCOUNTER — Encounter: Payer: Self-pay | Admitting: Physician Assistant

## 2017-01-31 VITALS — BP 104/63 | HR 92 | Temp 98.6°F | Ht 62.0 in | Wt 375.0 lb

## 2017-01-31 DIAGNOSIS — E662 Morbid (severe) obesity with alveolar hypoventilation: Secondary | ICD-10-CM | POA: Diagnosis not present

## 2017-01-31 DIAGNOSIS — Z0000 Encounter for general adult medical examination without abnormal findings: Secondary | ICD-10-CM

## 2017-01-31 DIAGNOSIS — M5442 Lumbago with sciatica, left side: Secondary | ICD-10-CM

## 2017-01-31 DIAGNOSIS — S3992XS Unspecified injury of lower back, sequela: Secondary | ICD-10-CM | POA: Diagnosis not present

## 2017-01-31 DIAGNOSIS — F3132 Bipolar disorder, current episode depressed, moderate: Secondary | ICD-10-CM | POA: Diagnosis not present

## 2017-01-31 DIAGNOSIS — E785 Hyperlipidemia, unspecified: Secondary | ICD-10-CM

## 2017-01-31 DIAGNOSIS — E1165 Type 2 diabetes mellitus with hyperglycemia: Secondary | ICD-10-CM

## 2017-01-31 DIAGNOSIS — E1169 Type 2 diabetes mellitus with other specified complication: Secondary | ICD-10-CM | POA: Diagnosis not present

## 2017-01-31 DIAGNOSIS — L608 Other nail disorders: Secondary | ICD-10-CM

## 2017-01-31 DIAGNOSIS — S3992XD Unspecified injury of lower back, subsequent encounter: Secondary | ICD-10-CM

## 2017-01-31 DIAGNOSIS — G8929 Other chronic pain: Secondary | ICD-10-CM

## 2017-01-31 DIAGNOSIS — R0902 Hypoxemia: Secondary | ICD-10-CM | POA: Diagnosis not present

## 2017-01-31 MED ORDER — EXENATIDE 5 MCG/0.02ML ~~LOC~~ SOPN: 5.0000 ug | PEN_INJECTOR | Freq: Two times a day (BID) | SUBCUTANEOUS | 2 refills | Status: DC

## 2017-01-31 MED ORDER — INVOKANA 300 MG PO TABS: 300.0000 mg | ORAL_TABLET | Freq: Every day | ORAL | 2 refills | Status: DC

## 2017-01-31 NOTE — Patient Instructions (Signed)
Carbohydrate Counting for Diabetes Mellitus, Adult Carbohydrate counting is a method for keeping track of how many carbohydrates you eat. Eating carbohydrates naturally increases the amount of sugar (glucose) in the blood. Counting how many carbohydrates you eat helps keep your blood glucose within normal limits, which helps you manage your diabetes (diabetes mellitus). It is important to know how many carbohydrates you can safely have in each meal. This is different for every person. A diet and nutrition specialist (registered dietitian) can help you make a meal plan and calculate how many carbohydrates you should have at each meal and snack. Carbohydrates are found in the following foods:  Grains, such as breads and cereals.  Dried beans and soy products.  Starchy vegetables, such as potatoes, peas, and corn.  Fruit and fruit juices.  Milk and yogurt.  Sweets and snack foods, such as cake, cookies, candy, chips, and soft drinks. How do I count carbohydrates? There are two ways to count carbohydrates in food. You can use either of the methods or a combination of both. Reading "Nutrition Facts" on packaged food  The "Nutrition Facts" list is included on the labels of almost all packaged foods and beverages in the [place] It includes:  The serving size.  Information about nutrients in each serving, including the grams (g) of carbohydrate per serving. To use the "Nutrition Facts":  Decide how many servings you will have.  Multiply the number of servings by the number of carbohydrates per serving.  The resulting number is the total amount of carbohydrates that you will be having. Learning standard serving sizes of other foods  When you eat foods containing carbohydrates that are not packaged or do not include "Nutrition Facts" on the label, you need to measure the servings in order to count the amount of carbohydrates:  Measure the foods that you will eat with a food scale or measuring  cup, if needed.  Decide how many standard-size servings you will eat.  Multiply the number of servings by 15. Most carbohydrate-rich foods have about 15 g of carbohydrates per serving.  For example, if you eat 8 oz (170 g) of strawberries, you will have eaten 2 servings and 30 g of carbohydrates (2 servings x 15 g = 30 g).  For foods that have more than one food mixed, such as soups and casseroles, you must count the carbohydrates in each food that is included. The following list contains standard serving sizes of common carbohydrate-rich foods. Each of these servings has about 15 g of carbohydrates:   hamburger bun or  English muffin.   oz (15 mL) syrup.   oz (14 g) jelly.  1 slice of bread.  1 six-inch tortilla.  3 oz (85 g) cooked rice or pasta.  4 oz (113 g) cooked dried beans.  4 oz (113 g) starchy vegetable, such as peas, corn, or potatoes.  4 oz (113 g) hot cereal.  4 oz (113 g) mashed potatoes or  of a large baked potato.  4 oz (113 g) canned or frozen fruit.  4 oz (120 mL) fruit juice.  4-6 crackers.  6 chicken nuggets.  6 oz (170 g) unsweetened dry cereal.  6 oz (170 g) plain fat-free yogurt or yogurt sweetened with artificial sweeteners.  8 oz (240 mL) milk.  8 oz (170 g) fresh fruit or one small piece of fruit.  24 oz (680 g) popped popcorn. Example of carbohydrate counting Sample meal  3 oz (85 g) chicken breast.  6 oz (  170 g) brown rice.  4 oz (113 g) corn.  8 oz (240 mL) milk.  8 oz (170 g) strawberries with sugar-free whipped topping. Carbohydrate calculation 1. Identify the foods that contain carbohydrates:  Rice.  Corn.  Milk.  Strawberries. 2. Calculate how many servings you have of each food:  2 servings rice.  1 serving corn.  1 serving milk.  1 serving strawberries. 3. Multiply each number of servings by 15 g:  2 servings rice x 15 g = 30 g.  1 serving corn x 15 g = 15 g.  1 serving milk x 15 g = 15  g.  1 serving strawberries x 15 g = 15 g. 4. Add together all of the amounts to find the total grams of carbohydrates eaten:  30 g + 15 g + 15 g + 15 g = 75 g of carbohydrates total. This information is not intended to replace advice given to you by your health care provider. Make sure you discuss any questions you have with your health care provider. Document Released: 08/28/2005 Document Revised: 03/17/2016 Document Reviewed: 02/09/2016 Elsevier Interactive Patient Education  2017 Elsevier Inc.  

## 2017-02-01 LAB — CMP14+EGFR
A/G RATIO: 1.1 — AB (ref 1.2–2.2)
ALT: 27 IU/L (ref 0–32)
AST: 39 IU/L (ref 0–40)
Albumin: 3.7 g/dL (ref 3.5–5.5)
Alkaline Phosphatase: 103 IU/L (ref 39–117)
BUN/Creatinine Ratio: 11 (ref 9–23)
BUN: 8 mg/dL (ref 6–24)
Bilirubin Total: <0.2 mg/dL (ref 0.0–1.2)
CALCIUM: 9.5 mg/dL (ref 8.7–10.2)
CO2: 31 mmol/L — AB (ref 18–29)
CREATININE: 0.7 mg/dL (ref 0.57–1.00)
Chloride: 96 mmol/L (ref 96–106)
GFR, EST AFRICAN AMERICAN: 121 mL/min/{1.73_m2} (ref >59)
GFR, EST NON AFRICAN AMERICAN: 105 mL/min/{1.73_m2} (ref >59)
GLOBULIN, TOTAL: 3.4 g/dL (ref 1.5–4.5)
Glucose: 186 mg/dL — ABNORMAL HIGH (ref 65–99)
Potassium: 4.9 mmol/L (ref 3.5–5.2)
SODIUM: 137 mmol/L (ref 134–144)
TOTAL PROTEIN: 7.1 g/dL (ref 6.0–8.5)

## 2017-02-01 LAB — CBC WITH DIFFERENTIAL/PLATELET
BASOS: 0 %
Basophils Absolute: 0 10*3/uL (ref 0.0–0.2)
EOS (ABSOLUTE): 0.2 10*3/uL (ref 0.0–0.4)
EOS: 3 %
HEMATOCRIT: 44.7 % (ref 34.0–46.6)
Hemoglobin: 14.1 g/dL (ref 11.1–15.9)
IMMATURE GRANS (ABS): 0 10*3/uL (ref 0.0–0.1)
IMMATURE GRANULOCYTES: 0 %
LYMPHS: 27 %
Lymphocytes Absolute: 2.5 10*3/uL (ref 0.7–3.1)
MCH: 28.8 pg (ref 26.6–33.0)
MCHC: 31.5 g/dL (ref 31.5–35.7)
MCV: 91 fL (ref 79–97)
MONOCYTES: 8 %
Monocytes Absolute: 0.7 10*3/uL (ref 0.1–0.9)
Neutrophils Absolute: 5.6 10*3/uL (ref 1.4–7.0)
Neutrophils: 62 %
Platelets: 319 10*3/uL (ref 150–379)
RBC: 4.9 x10E6/uL (ref 3.77–5.28)
RDW: 13.4 % (ref 12.3–15.4)
WBC: 9 10*3/uL (ref 3.4–10.8)

## 2017-02-01 LAB — LIPID PANEL
Chol/HDL Ratio: 5.5 ratio — ABNORMAL HIGH (ref 0.0–4.4)
Cholesterol, Total: 169 mg/dL (ref 100–199)
HDL: 31 mg/dL — AB (ref >39)
LDL CALC: 104 mg/dL — AB (ref 0–99)
TRIGLYCERIDES: 172 mg/dL — AB (ref 0–149)
VLDL Cholesterol Cal: 34 mg/dL (ref 5–40)

## 2017-02-01 LAB — TSH: TSH: 1.1 u[IU]/mL (ref 0.450–4.500)

## 2017-02-02 DIAGNOSIS — E1165 Type 2 diabetes mellitus with hyperglycemia: Secondary | ICD-10-CM | POA: Insufficient documentation

## 2017-02-02 DIAGNOSIS — M5442 Lumbago with sciatica, left side: Secondary | ICD-10-CM

## 2017-02-02 DIAGNOSIS — E785 Hyperlipidemia, unspecified: Secondary | ICD-10-CM

## 2017-02-02 DIAGNOSIS — S3992XA Unspecified injury of lower back, initial encounter: Secondary | ICD-10-CM | POA: Insufficient documentation

## 2017-02-02 DIAGNOSIS — E1169 Type 2 diabetes mellitus with other specified complication: Secondary | ICD-10-CM | POA: Insufficient documentation

## 2017-02-02 DIAGNOSIS — G8929 Other chronic pain: Secondary | ICD-10-CM | POA: Insufficient documentation

## 2017-02-02 DIAGNOSIS — L608 Other nail disorders: Secondary | ICD-10-CM | POA: Insufficient documentation

## 2017-02-02 DIAGNOSIS — Z0000 Encounter for general adult medical examination without abnormal findings: Secondary | ICD-10-CM | POA: Insufficient documentation

## 2017-02-02 NOTE — Progress Notes (Signed)
BP 104/63   Pulse 92   Temp 98.6 F (37 C) (Oral)   Ht '5\' 2"'$  (1.575 m)   Wt (!) 375 lb (170.1 kg)   BMI 68.59 kg/m    Subjective:    Patient ID: Aimee Phillips, female    DOB: 10/26/1970, 46 y.o.   MRN: [id number]  HPI: Aimee Phillips is a 46 y.o. female presenting on 01/31/2017 for Establish Care  Patient comes in to establish care. She is seen by Dr. Melina Copa over the past year. She has multiple chronic medical problems. She does have diabetes, chronic back degenerative problem, morbid obesity, bipolar disorder, obesity hypoventilation syndrome. She had a power wheelchair in the past but has not had one for some time. She is eligible to have a new one. We will have her evaluated by neuro physical therapy for this. She is also having a toenail that is getting dark and pulling away. She denies any trauma to it. She also is on oxygen for hypoxia. She gets this or [facility]. A new order needs to be sent for this.  Relevant past medical, surgical, family and social history reviewed and updated as indicated. Allergies and medications reviewed and updated.  Past Medical History:  Diagnosis Date  . Asthma   . Bipolar affect, depressed ([place])   . Cellulitis   . COPD (chronic obstructive pulmonary disease) ([place])   . Depression   . PE (pulmonary embolism)   . Pulmonary edema     Past Surgical History:  Procedure Laterality Date  . CESAREAN SECTION    . CHOLECYSTECTOMY      Review of Systems  Constitutional: Positive for fatigue. Negative for activity change and fever.  HENT: Negative.   Eyes: Negative.   Respiratory: Positive for shortness of breath. Negative for cough.   Cardiovascular: Negative.  Negative for chest pain.  Gastrointestinal: Negative.  Negative for abdominal pain.  Endocrine: Negative.   Genitourinary: Negative.  Negative for dysuria.  Musculoskeletal: Positive for arthralgias, back pain and gait problem.  Skin: Negative.     Allergies as of 01/31/2017   No Known  Allergies     Medication List       Accurate as of 01/31/17 11:59 PM. Always use your most recent med list.          atorvastatin 20 MG tablet Commonly known as:  LIPITOR   budesonide-formoterol 160-4.5 MCG/ACT inhaler Commonly known as:  SYMBICORT Inhale 2 puffs into the lungs 2 (two) times daily.   cyclobenzaprine 10 MG tablet Commonly known as:  FLEXERIL Take 1 tablet (10 mg total) by mouth 3 (three) times daily as needed for muscle spasms.   diclofenac 50 MG tablet Commonly known as:  CATAFLAM Take 1 tablet by mouth daily.   exenatide 5 MCG/0.02ML Sopn injection Commonly known as:  BYETTA 5 MCG PEN Inject 0.02 mLs (5 mcg total) into the skin 2 (two) times daily with a meal.   FLUoxetine 20 MG capsule Commonly known as:  PROZAC   furosemide 40 MG tablet Commonly known as:  LASIX   INVOKANA 300 MG Tabs tablet Generic drug:  canagliflozin Take 1 tablet (300 mg total) by mouth daily before breakfast.   ipratropium-albuterol 0.5-2.5 (3) MG/3ML Soln Commonly known as:  DUONEB Take 3 mLs by nebulization every 6 (six) hours as needed.   metFORMIN 850 MG tablet Commonly known as:  GLUCOPHAGE Take 1 tablet by mouth 2 (two) times daily.   OLANZapine 10 MG tablet Commonly known  as:  ZYPREXA Take 10 mg by mouth at bedtime.   oxybutynin 15 MG 24 hr tablet Commonly known as:  DITROPAN XL Take 1 tablet by mouth 2 (two) times daily.   potassium chloride 10 MEQ CR tablet Commonly known as:  KLOR-CON Take 10 mEq by mouth every morning.   PROAIR HFA 108 (90 Base) MCG/ACT inhaler Generic drug:  albuterol Inhale 2 puffs into the lungs 2 (two) times daily.   spironolactone 50 MG tablet Commonly known as:  ALDACTONE Take 50 mg by mouth every morning.   traMADol 50 MG tablet Commonly known as:  ULTRAM Take 50 mg by mouth every 6 (six) hours as needed. Pain   traZODone 100 MG tablet Commonly known as:  DESYREL Take 100 mg by mouth at bedtime.   zolpidem 10 MG  tablet Commonly known as:  AMBIEN          Objective:    BP 104/63   Pulse 92   Temp 98.6 F (37 C) (Oral)   Ht '5\' 2"'$  (1.575 m)   Wt (!) 375 lb (170.1 kg)   BMI 68.59 kg/m   No Known Allergies  Physical Exam  Constitutional: She is oriented to person, place, and time. She appears well-developed.  Obese female confined to wheelchair, on oxygen at 2 L nasal cannula  HENT:  Head: Normocephalic and atraumatic.  Right Ear: Tympanic membrane, external ear and ear canal normal.  Left Ear: Tympanic membrane, external ear and ear canal normal.  Nose: Nose normal. No rhinorrhea.  Mouth/Throat: Oropharynx is clear and moist and mucous membranes are normal. No oropharyngeal exudate or posterior oropharyngeal erythema.  Eyes: Conjunctivae and EOM are normal. Pupils are equal, round, and reactive to light.  Neck: Normal range of motion. Neck supple.  Cardiovascular: Normal rate, regular rhythm, normal heart sounds and intact distal pulses.   Pulmonary/Chest: Effort normal and breath sounds normal.  Abdominal: Soft. Bowel sounds are normal.  Neurological: She is alert and oriented to person, place, and time. She has normal reflexes.  Skin: Skin is warm and dry. No rash noted.  Psychiatric: She has a normal mood and affect. Her behavior is normal. Judgment and thought content normal.    Results for orders placed or performed in visit on 01/31/17  CBC with Differential/Platelet  Result Value Ref Range   WBC 9.0 3.4 - 10.8 x10E3/uL   RBC 4.90 3.77 - 5.28 x10E6/uL   Hemoglobin 14.1 11.1 - 15.9 g/dL   Hematocrit 44.7 34.0 - 46.6 %   MCV 91 79 - 97 fL   MCH 28.8 26.6 - 33.0 pg   MCHC 31.5 31.5 - 35.7 g/dL   RDW 13.4 12.3 - 15.4 %   Platelets 319 150 - 379 x10E3/uL   Neutrophils 62 Not Estab. %   Lymphs 27 Not Estab. %   Monocytes 8 Not Estab. %   Eos 3 Not Estab. %   Basos 0 Not Estab. %   Neutrophils Absolute 5.6 1.4 - 7.0 x10E3/uL   Lymphocytes Absolute 2.5 0.7 - 3.1 x10E3/uL    Monocytes Absolute 0.7 0.1 - 0.9 x10E3/uL   EOS (ABSOLUTE) 0.2 0.0 - 0.4 x10E3/uL   Basophils Absolute 0.0 0.0 - 0.2 x10E3/uL   Immature Granulocytes 0 Not Estab. %   Immature Grans (Abs) 0.0 0.0 - 0.1 x10E3/uL  CMP14+EGFR  Result Value Ref Range   Glucose 186 (H) 65 - 99 mg/dL   BUN 8 6 - 24 mg/dL   Creatinine, Ser 0.70  0.57 - 1.00 mg/dL   GFR calc non Af Amer 105 >59 mL/min/1.73   GFR calc Af Amer 121 >59 mL/min/1.73   BUN/Creatinine Ratio 11 9 - 23   Sodium 137 134 - 144 mmol/L   Potassium 4.9 3.5 - 5.2 mmol/L   Chloride 96 96 - 106 mmol/L   CO2 31 (H) 18 - 29 mmol/L   Calcium 9.5 8.7 - 10.2 mg/dL   Total Protein 7.1 6.0 - 8.5 g/dL   Albumin 3.7 3.5 - 5.5 g/dL   Globulin, Total 3.4 1.5 - 4.5 g/dL   Albumin/Globulin Ratio 1.1 (L) 1.2 - 2.2   Bilirubin Total <0.2 0.0 - 1.2 mg/dL   Alkaline Phosphatase 103 39 - 117 IU/L   AST 39 0 - 40 IU/L   ALT 27 0 - 32 IU/L  Lipid panel  Result Value Ref Range   Cholesterol, Total 169 100 - 199 mg/dL   Triglycerides 172 (H) 0 - 149 mg/dL   HDL 31 (L) >39 mg/dL   VLDL Cholesterol Cal 34 5 - 40 mg/dL   LDL Calculated 104 (H) 0 - 99 mg/dL   Chol/HDL Ratio 5.5 (H) 0.0 - 4.4 ratio  TSH  Result Value Ref Range   TSH 1.100 0.450 - 4.500 uIU/mL      Assessment & Plan:   1. Injury of back, subsequent encounter - Ambulatory referral to Orthopedic Surgery  2. Chronic left-sided low back pain with left-sided sciatica - Ambulatory referral to Orthopedic Surgery  3. Deformity of toenail Podiatry refill  4. Obesity hypoventilation syndrome (HCC) Oxygen therapy  5. Bipolar affective disorder, currently depressed, moderate (HCC) - FLUoxetine (PROZAC) 20 MG capsule;  - OLANZapine (ZYPREXA) 10 MG tablet; Take 10 mg by mouth at bedtime.  6. Type 2 diabetes mellitus with hyperglycemia, without long-term current use of insulin (HCC) - CBC with Differential/Platelet - CMP14+EGFR - Lipid panel - TSH - INVOKANA 300 MG TABS tablet; Take 1  tablet (300 mg total) by mouth daily before breakfast.  Dispense: 30 tablet; Refill: 2 - exenatide (BYETTA 5 MCG PEN) 5 MCG/0.02ML SOPN injection; Inject 0.02 mLs (5 mcg total) into the skin 2 (two) times daily with a meal.  Dispense: 1.2 mL; Refill: 2  7. Hyperlipidemia associated with type 2 diabetes mellitus (HCC) - atorvastatin (LIPITOR) 20 MG tablet;  - CMP14+EGFR - Lipid panel  8. Well adult exam - CBC with Differential/Platelet - CMP14+EGFR - Lipid panel - TSH  9. Hypoxia Oxygen therapy    Current Outpatient Prescriptions:  .  albuterol (PROAIR HFA) 108 (90 BASE) MCG/ACT inhaler, Inhale 2 puffs into the lungs 2 (two) times daily.  , Disp: , Rfl:  .  atorvastatin (LIPITOR) 20 MG tablet, , Disp: , Rfl:  .  budesonide-formoterol (SYMBICORT) 160-4.5 MCG/ACT inhaler, Inhale 2 puffs into the lungs 2 (two) times daily.  , Disp: , Rfl:  .  cyclobenzaprine (FLEXERIL) 10 MG tablet, Take 1 tablet (10 mg total) by mouth 3 (three) times daily as needed for muscle spasms., Disp: 30 tablet, Rfl: 0 .  diclofenac (CATAFLAM) 50 MG tablet, Take 1 tablet by mouth daily., Disp: , Rfl:  .  FLUoxetine (PROZAC) 20 MG capsule, , Disp: , Rfl:  .  furosemide (LASIX) 40 MG tablet, , Disp: , Rfl:  .  INVOKANA 300 MG TABS tablet, Take 1 tablet (300 mg total) by mouth daily before breakfast., Disp: 30 tablet, Rfl: 2 .  ipratropium-albuterol (DUONEB) 0.5-2.5 (3) MG/3ML SOLN, Take 3 mLs by nebulization  every 6 (six) hours as needed., Disp: , Rfl:  .  metFORMIN (GLUCOPHAGE) 850 MG tablet, Take 1 tablet by mouth 2 (two) times daily., Disp: , Rfl:  .  OLANZapine (ZYPREXA) 10 MG tablet, Take 10 mg by mouth at bedtime., Disp: , Rfl:  .  oxybutynin (DITROPAN XL) 15 MG 24 hr tablet, Take 1 tablet by mouth 2 (two) times daily., Disp: , Rfl:  .  potassium chloride (KLOR-CON) 10 MEQ CR tablet, Take 10 mEq by mouth every morning.  , Disp: , Rfl:  .  spironolactone (ALDACTONE) 50 MG tablet, Take 50 mg by mouth every  morning. , Disp: , Rfl:  .  traMADol (ULTRAM) 50 MG tablet, Take 50 mg by mouth every 6 (six) hours as needed. Pain, Disp: , Rfl:  .  traZODone (DESYREL) 100 MG tablet, Take 100 mg by mouth at bedtime.  , Disp: , Rfl:  .  zolpidem (AMBIEN) 10 MG tablet, , Disp: , Rfl:  .  exenatide (BYETTA 5 MCG PEN) 5 MCG/0.02ML SOPN injection, Inject 0.02 mLs (5 mcg total) into the skin 2 (two) times daily with a meal., Disp: 1.2 mL, Rfl: 2 Continue all other maintenance medications as listed above.  Follow up plan: Return in about 4 weeks (around 02/28/2017) for [facility] records past 6 months, sign.  Educational handout given for  Terald Sleeper PA-C [place] [address]  [place], [place] [address] [phone]   02/02/2017, 2:06 PM

## 2017-02-15 ENCOUNTER — Other Ambulatory Visit: Payer: Self-pay | Admitting: Physician Assistant

## 2017-02-15 DIAGNOSIS — E1165 Type 2 diabetes mellitus with hyperglycemia: Secondary | ICD-10-CM

## 2017-02-21 ENCOUNTER — Ambulatory Visit (INDEPENDENT_AMBULATORY_CARE_PROVIDER_SITE_OTHER): Payer: Medicare Other | Admitting: Orthopedic Surgery

## 2017-02-27 ENCOUNTER — Other Ambulatory Visit: Payer: Self-pay | Admitting: Physician Assistant

## 2017-02-28 ENCOUNTER — Ambulatory Visit: Payer: Medicare Other | Admitting: Physician Assistant

## 2017-02-28 ENCOUNTER — Ambulatory Visit: Payer: Medicare Other | Admitting: Podiatry

## 2017-03-21 ENCOUNTER — Encounter: Payer: Self-pay | Admitting: Physician Assistant

## 2017-03-21 ENCOUNTER — Ambulatory Visit (INDEPENDENT_AMBULATORY_CARE_PROVIDER_SITE_OTHER): Payer: Medicare Other | Admitting: Physician Assistant

## 2017-03-21 VITALS — BP 133/86 | HR 91 | Temp 97.7°F | Ht 62.0 in | Wt 375.0 lb

## 2017-03-21 DIAGNOSIS — E1165 Type 2 diabetes mellitus with hyperglycemia: Secondary | ICD-10-CM

## 2017-03-21 DIAGNOSIS — M5442 Lumbago with sciatica, left side: Secondary | ICD-10-CM

## 2017-03-21 DIAGNOSIS — G8929 Other chronic pain: Secondary | ICD-10-CM

## 2017-03-21 DIAGNOSIS — E662 Morbid (severe) obesity with alveolar hypoventilation: Secondary | ICD-10-CM

## 2017-03-21 DIAGNOSIS — F3132 Bipolar disorder, current episode depressed, moderate: Secondary | ICD-10-CM

## 2017-03-21 LAB — BAYER DCA HB A1C WAIVED: HB A1C: 7.1 % — AB (ref <7.0)

## 2017-03-21 MED ORDER — CYCLOBENZAPRINE HCL 10 MG PO TABS: 10.0000 mg | ORAL_TABLET | Freq: Three times a day (TID) | ORAL | 5 refills | Status: DC | PRN

## 2017-03-21 MED ORDER — FLUCONAZOLE 150 MG PO TABS: 150.0000 mg | ORAL_TABLET | Freq: Once | ORAL | 5 refills | Status: AC

## 2017-03-21 MED ORDER — TRAMADOL HCL 50 MG PO TABS: 50.0000 mg | ORAL_TABLET | Freq: Four times a day (QID) | ORAL | 2 refills | Status: DC | PRN

## 2017-03-21 MED ORDER — MIRTAZAPINE 30 MG PO TABS: 30.0000 mg | ORAL_TABLET | Freq: Every day | ORAL | 1 refills | Status: DC

## 2017-03-21 NOTE — Patient Instructions (Signed)
In a few days you may receive a survey in the mail or online from [employer] regarding your visit with us today. Please take a moment to fill this out. Your feedback is very important to our whole office. It can help us better understand your needs as well as improve your experience and satisfaction. Thank you for taking your time to complete it. We care about you.  Jamen Loiseau, PA-C  

## 2017-03-22 ENCOUNTER — Telehealth: Payer: Self-pay | Admitting: *Deleted

## 2017-03-22 DIAGNOSIS — M5442 Lumbago with sciatica, left side: Principal | ICD-10-CM

## 2017-03-22 DIAGNOSIS — G8929 Other chronic pain: Secondary | ICD-10-CM

## 2017-03-22 NOTE — Progress Notes (Signed)
BP 133/86   Pulse 91   Temp 97.7 F (36.5 C) (Oral)   Ht 5\' 2"  (1.575 m)   Wt (!) 375 lb (170.1 kg)   BMI 68.59 kg/m    Subjective:    Patient ID: Aimee Phillips, female    DOB: [birth year], 46 y.o.   MRN: [id number]  HPI: Aimee Phillips is a 46 y.o. female presenting on 03/21/2017 for Depression and Vaginitis  This patient comes in for periodic recheck on medications and conditions including depression,diabetes, obesity, chronic low back pain. Chronic pain is under control, patient has no new complaints. Medications are keeping things stable. Needs refills for the next three months.  [place] Controlled Substance website checked and normal. Drug screen normal this year. .   All medications are reviewed today. There are no reports of any problems with the medications. All of the medical conditions are reviewed and updated.  Lab work is reviewed and will be ordered as medically necessary. There are no new problems reported with today's visit.   Relevant past medical, surgical, family and social history reviewed and updated as indicated. Allergies and medications reviewed and updated.  Past Medical History:  Diagnosis Date  . Asthma   . Bipolar affect, depressed (HCC)   . Cellulitis   . COPD (chronic obstructive pulmonary disease) (HCC)   . Depression   . PE (pulmonary embolism)   . Pulmonary edema     Past Surgical History:  Procedure Laterality Date  . CESAREAN SECTION    . CHOLECYSTECTOMY      Review of Systems  Constitutional: Negative for activity change, fatigue and fever.  HENT: Negative.   Eyes: Negative.   Respiratory: Negative.  Negative for cough.   Cardiovascular: Positive for leg swelling. Negative for chest pain.  Gastrointestinal: Negative.  Negative for abdominal pain.  Endocrine: Negative.   Genitourinary: Negative.  Negative for dysuria.  Musculoskeletal: Positive for arthralgias, back pain and gait problem.  Skin: Negative.   Neurological: Positive  for weakness.    Allergies as of 03/21/2017   No Known Allergies     Medication List       Accurate as of 03/21/17 11:59 PM. Always use your most recent med list.          atorvastatin 20 MG tablet Commonly known as:  LIPITOR   BYETTA 5 MCG PEN 5 MCG/0.02ML Sopn injection Generic drug:  exenatide INJECT SUBCUTANEOUSLY TWICE DAILY   cyclobenzaprine 10 MG tablet Commonly known as:  FLEXERIL Take 1 tablet (10 mg total) by mouth 3 (three) times daily as needed for muscle spasms.   diclofenac 50 MG tablet Commonly known as:  CATAFLAM Take 1 tablet by mouth daily.   fluconazole 150 MG tablet Commonly known as:  DIFLUCAN Take 1 tablet (150 mg total) by mouth once.   FLUoxetine 20 MG capsule Commonly known as:  PROZAC   furosemide 40 MG tablet Commonly known as:  LASIX TAKE 1 TABLET BY MOUTH TWICE DAILY   INVOKANA 300 MG Tabs tablet Generic drug:  canagliflozin Take 1 tablet (300 mg total) by mouth daily before breakfast.   ipratropium-albuterol 0.5-2.5 (3) MG/3ML Soln Commonly known as:  DUONEB Take 3 mLs by nebulization every 6 (six) hours as needed.   mirtazapine 30 MG tablet Commonly known as:  REMERON Take 1 tablet (30 mg total) by mouth at bedtime.   OLANZapine 10 MG tablet Commonly known as:  ZYPREXA Take 10 mg by mouth at bedtime.  oxybutynin 15 MG 24 hr tablet Commonly known as:  DITROPAN XL Take 1 tablet by mouth 2 (two) times daily.   potassium chloride 10 MEQ tablet Commonly known as:  K-DUR TAKE 1 TABLET BY MOUTH TWICE DAILY   spironolactone 50 MG tablet Commonly known as:  ALDACTONE Take 50 mg by mouth every morning.   SYMBICORT 160-4.5 MCG/ACT inhaler Generic drug:  budesonide-formoterol INHALE 2 PUFFS BY MOUTH TWICE DAILY.   traMADol 50 MG tablet Commonly known as:  ULTRAM Take 1 tablet (50 mg total) by mouth every 6 (six) hours as needed. Pain   traZODone 100 MG tablet Commonly known as:  DESYREL Take 100 mg by mouth at  bedtime.   VENTOLIN HFA 108 (90 Base) MCG/ACT inhaler Generic drug:  albuterol INHALE 2 PUFFS BY MOUTH EVERY 4 HOURS AS NEEDED   zolpidem 10 MG tablet Commonly known as:  AMBIEN          Objective:    BP 133/86   Pulse 91   Temp 97.7 F (36.5 C) (Oral)   Ht 5\' 2"  (1.575 m)   Wt (!) 375 lb (170.1 kg)   BMI 68.59 kg/m   No Known Allergies  Physical Exam  Constitutional: She is oriented to person, place, and time. She appears well-developed and well-nourished.  HENT:  Head: Normocephalic and atraumatic.  Eyes: Pupils are equal, round, and reactive to light. Conjunctivae and EOM are normal.  Cardiovascular: Normal rate, regular rhythm, normal heart sounds and intact distal pulses.   Pulmonary/Chest: Effort normal and breath sounds normal.  Abdominal: Soft. Bowel sounds are normal.  Neurological: She is alert and oriented to person, place, and time. She has normal reflexes.  Skin: Skin is warm and dry. No rash noted.  Psychiatric: She has a normal mood and affect. Her behavior is normal. Judgment and thought content normal.    Results for orders placed or performed in visit on 03/21/17  Bayer DCA Hb A1c Waived  Result Value Ref Range   Bayer DCA Hb A1c Waived 7.1 (H) <7.0 %      Assessment & Plan:   1. Type 2 diabetes mellitus with hyperglycemia, without long-term current use of insulin (HCC) - Bayer DCA Hb A1c Waived  2. Chronic left-sided low back pain with left-sided sciatica - cyclobenzaprine (FLEXERIL) 10 MG tablet; Take 1 tablet (10 mg total) by mouth 3 (three) times daily as needed for muscle spasms.  Dispense: 90 tablet; Refill: 5 - traMADol (ULTRAM) 50 MG tablet; Take 1 tablet (50 mg total) by mouth every 6 (six) hours as needed. Pain  Dispense: 120 tablet; Refill: 2  3. Obesity, morbid (more than 100 lbs over ideal weight or BMI > 40) (HCC)  4. Bipolar affective disorder, currently depressed, moderate (HCC) - mirtazapine (REMERON) 30 MG tablet; Take 1  tablet (30 mg total) by mouth at bedtime.  Dispense: 30 tablet; Refill: 1  5. Obesity hypoventilation syndrome (HCC)   Current Outpatient Prescriptions:  .  atorvastatin (LIPITOR) 20 MG tablet, , Disp: , Rfl:  .  BYETTA 5 MCG PEN 5 MCG/0.02ML SOPN injection, INJECT SUBCUTANEOUSLY TWICE DAILY, Disp: 8.4 mL, Rfl: 0 .  cyclobenzaprine (FLEXERIL) 10 MG tablet, Take 1 tablet (10 mg total) by mouth 3 (three) times daily as needed for muscle spasms., Disp: 90 tablet, Rfl: 5 .  diclofenac (CATAFLAM) 50 MG tablet, Take 1 tablet by mouth daily., Disp: , Rfl:  .  FLUoxetine (PROZAC) 20 MG capsule, , Disp: , Rfl:  .  furosemide (LASIX) 40 MG tablet, TAKE 1 TABLET BY MOUTH TWICE DAILY, Disp: 60 tablet, Rfl: 4 .  INVOKANA 300 MG TABS tablet, Take 1 tablet (300 mg total) by mouth daily before breakfast., Disp: 30 tablet, Rfl: 2 .  ipratropium-albuterol (DUONEB) 0.5-2.5 (3) MG/3ML SOLN, Take 3 mLs by nebulization every 6 (six) hours as needed., Disp: , Rfl:  .  OLANZapine (ZYPREXA) 10 MG tablet, Take 10 mg by mouth at bedtime., Disp: , Rfl:  .  oxybutynin (DITROPAN XL) 15 MG 24 hr tablet, Take 1 tablet by mouth 2 (two) times daily., Disp: , Rfl:  .  potassium chloride (K-DUR) 10 MEQ tablet, TAKE 1 TABLET BY MOUTH TWICE DAILY, Disp: 60 tablet, Rfl: 4 .  spironolactone (ALDACTONE) 50 MG tablet, Take 50 mg by mouth every morning. , Disp: , Rfl:  .  SYMBICORT 160-4.5 MCG/ACT inhaler, INHALE 2 PUFFS BY MOUTH TWICE DAILY., Disp: 10.2 g, Rfl: 1 .  traMADol (ULTRAM) 50 MG tablet, Take 1 tablet (50 mg total) by mouth every 6 (six) hours as needed. Pain, Disp: 120 tablet, Rfl: 2 .  traZODone (DESYREL) 100 MG tablet, Take 100 mg by mouth at bedtime.  , Disp: , Rfl:  .  VENTOLIN HFA 108 (90 Base) MCG/ACT inhaler, INHALE 2 PUFFS BY MOUTH EVERY 4 HOURS AS NEEDED, Disp: 18 each, Rfl: 1 .  zolpidem (AMBIEN) 10 MG tablet, , Disp: , Rfl:  .  mirtazapine (REMERON) 30 MG tablet, Take 1 tablet (30 mg total) by mouth at  bedtime., Disp: 30 tablet, Rfl: 1  Continue all other maintenance medications as listed above.  Follow up plan: Return in about 4 weeks (around 04/18/2017) for recheck.  Educational handout given for survey   Remus LofflerAngel S. Derreon Consalvo PA-C [facility] [address]  [place], [place] [address] [phone]   03/22/2017, 4:58 PM

## 2017-03-22 NOTE — Telephone Encounter (Signed)
[facility] pharmacy called and said they only could fill tramadol for 7 days and patient will need another rx after the 7 days for #120.

## 2017-03-23 MED ORDER — TRAMADOL HCL 50 MG PO TABS: 50.0000 mg | ORAL_TABLET | Freq: Four times a day (QID) | ORAL | 2 refills | Status: DC | PRN

## 2017-03-23 NOTE — Telephone Encounter (Signed)
Pt notified of results Verbalizes understanding 

## 2017-03-28 ENCOUNTER — Other Ambulatory Visit: Payer: Self-pay | Admitting: *Deleted

## 2017-03-28 DIAGNOSIS — F3132 Bipolar disorder, current episode depressed, moderate: Secondary | ICD-10-CM

## 2017-03-28 MED ORDER — FLUOXETINE HCL 20 MG PO CAPS: 60.0000 mg | ORAL_CAPSULE | Freq: Every day | ORAL | 2 refills | Status: DC

## 2017-03-30 ENCOUNTER — Other Ambulatory Visit: Payer: Self-pay | Admitting: *Deleted

## 2017-04-20 ENCOUNTER — Encounter: Payer: Self-pay | Admitting: Physician Assistant

## 2017-04-20 ENCOUNTER — Ambulatory Visit (INDEPENDENT_AMBULATORY_CARE_PROVIDER_SITE_OTHER): Payer: Medicare Other | Admitting: Physician Assistant

## 2017-04-20 ENCOUNTER — Ambulatory Visit: Payer: Medicare Other | Admitting: Physician Assistant

## 2017-04-20 VITALS — BP 136/88 | HR 90 | Temp 98.8°F | Ht 62.0 in | Wt 375.0 lb

## 2017-04-20 DIAGNOSIS — M5442 Lumbago with sciatica, left side: Secondary | ICD-10-CM | POA: Diagnosis not present

## 2017-04-20 DIAGNOSIS — Z9181 History of falling: Secondary | ICD-10-CM | POA: Insufficient documentation

## 2017-04-20 DIAGNOSIS — E662 Morbid (severe) obesity with alveolar hypoventilation: Secondary | ICD-10-CM | POA: Diagnosis not present

## 2017-04-20 DIAGNOSIS — M6281 Muscle weakness (generalized): Secondary | ICD-10-CM | POA: Diagnosis not present

## 2017-04-20 DIAGNOSIS — M545 Low back pain, unspecified: Secondary | ICD-10-CM | POA: Insufficient documentation

## 2017-04-20 DIAGNOSIS — G8929 Other chronic pain: Secondary | ICD-10-CM | POA: Diagnosis not present

## 2017-04-20 DIAGNOSIS — F3132 Bipolar disorder, current episode depressed, moderate: Secondary | ICD-10-CM

## 2017-04-20 DIAGNOSIS — K219 Gastro-esophageal reflux disease without esophagitis: Secondary | ICD-10-CM

## 2017-04-20 MED ORDER — ZOLPIDEM TARTRATE 10 MG PO TABS: 10.0000 mg | ORAL_TABLET | Freq: Every day | ORAL | 2 refills | Status: DC

## 2017-04-20 MED ORDER — OMEPRAZOLE 20 MG PO CPDR: 20.0000 mg | DELAYED_RELEASE_CAPSULE | Freq: Every day | ORAL | 3 refills | Status: DC

## 2017-04-20 MED ORDER — OLANZAPINE 10 MG PO TABS: 10.0000 mg | ORAL_TABLET | Freq: Every day | ORAL | 5 refills | Status: DC

## 2017-04-20 NOTE — Patient Instructions (Signed)
In a few days you may receive a survey in the mail or online from [employer] regarding your visit with us today. Please take a moment to fill this out. Your feedback is very important to our whole office. It can help us better understand your needs as well as improve your experience and satisfaction. Thank you for taking your time to complete it. We care about you.  Alaylah Heatherington, PA-C  

## 2017-04-20 NOTE — Progress Notes (Addendum)
BP 136/88   Pulse 90   Temp 98.8 F (37.1 C) (Oral)   Ht 5\' 2"  (1.575 m)   Wt (!) 375 lb (170.1 kg)   BMI 68.59 kg/m    Subjective:    Patient ID: Aimee Phillips, female    DOB: [birth year], 46 y.o.   MRN: [id number]  HPI: Aimee Phillips is a 46 y.o. female presenting on 04/20/2017 for Face to Face power wheel chair and Chest pain after eating  The patient comes in for a MOBILITY EXAMINATION/Face to Face Exam for power mobility device. She has been in a wheelchair for 5 years  Or more. Had a power chair in the past that stopped running. Has been in a propelled chair and dependent on family to move. Uses legs to pull along if alone but cannot use the upper extremities to truly propel any distance.  She needs one for any distances, cannot walk long distance, has had falls in the past. In early 2018 chief [place] and fractured her back.  She does have the diagnoses of obesity hypoventilation syndrome and desaturates her oxygen after she walks for very long, she has severe morbid obesity. She is chronic low back pain with sciatica, at risk for falls. She needs a chair in order to perform daily ADLs including getting to the bathroom, hygiene in the bathroom, kitchen work that she does seated includes fold laundry, wash dishes, minimal meal preparation, she also needs ability to get to her room to close and dress herself. She has tried to use a cane or walker in the past. She gives out with muscle weakness and oxygen desaturation. Her balance is poor due to her back pain and deconditioning. She is oxygen dependent. She is currently in a manual wheelchair but is unable to propel it for very long and depends upon family members to move the wheelchair. She does not have upper extremity strength in order to propel for very long. Her home is very tight in the doorways and the size of the rooms and she is unable to use a scooter style power vehicle. She will be using the power chair in her home and community.  She can safely operate a power mobility device both mentally and physically. She is very willing and motivated to use the power chair. Again she has had one in the past and had great success with it and will be a very good candidate for a power mobility device. The patient is oxygen dependent and will require an accessory of oxygen holder.  Her pain level is generally an 8 out of 10. We are awaiting a call from chronic pain center for her to be evaluated and followed.  Relevant past medical, surgical, family and social history reviewed and updated as indicated. Allergies and medications reviewed and updated.  Past Medical History:  Diagnosis Date  . Asthma   . Bipolar affect, depressed (HCC)   . Cellulitis   . COPD (chronic obstructive pulmonary disease) (HCC)   . Depression   . PE (pulmonary embolism)   . Pulmonary edema     Past Surgical History:  Procedure Laterality Date  . CESAREAN SECTION    . CHOLECYSTECTOMY      Review of Systems  Constitutional: Positive for fatigue. Negative for activity change and fever.  HENT: Negative.   Eyes: Negative.   Respiratory: Positive for shortness of breath. Negative for cough and wheezing.   Cardiovascular: Positive for leg swelling. Negative for chest pain and  palpitations.  Gastrointestinal: Negative.  Negative for abdominal pain.  Endocrine: Negative.   Genitourinary: Negative.  Negative for dysuria.  Musculoskeletal: Positive for arthralgias, back pain and gait problem.  Skin: Negative.  Negative for color change and rash.  Neurological: Positive for weakness. Negative for dizziness, light-headedness and headaches.    Allergies as of 04/20/2017   No Known Allergies     Medication List       Accurate as of 04/20/17 11:31 AM. Always use your most recent med list.          atorvastatin 20 MG tablet Commonly known as:  LIPITOR   BYETTA 5 MCG PEN 5 MCG/0.02ML Sopn injection Generic drug:  exenatide INJECT 5MCG SUBCUTANEOUSLY  TWICE DAILY   cyclobenzaprine 10 MG tablet Commonly known as:  FLEXERIL Take 1 tablet (10 mg total) by mouth 3 (three) times daily as needed for muscle spasms.   diclofenac 50 MG tablet Commonly known as:  CATAFLAM Take 1 tablet by mouth daily.   FLUoxetine 20 MG capsule Commonly known as:  PROZAC Take 3 capsules (60 mg total) by mouth daily.   furosemide 40 MG tablet Commonly known as:  LASIX TAKE 1 TABLET BY MOUTH TWICE DAILY   INVOKANA 300 MG Tabs tablet Generic drug:  canagliflozin Take 1 tablet (300 mg total) by mouth daily before breakfast.   ipratropium-albuterol 0.5-2.5 (3) MG/3ML Soln Commonly known as:  DUONEB Take 3 mLs by nebulization every 6 (six) hours as needed.   mirtazapine 30 MG tablet Commonly known as:  REMERON Take 1 tablet (30 mg total) by mouth at bedtime.   OLANZapine 10 MG tablet Commonly known as:  ZYPREXA Take 10 mg by mouth at bedtime.   oxybutynin 15 MG 24 hr tablet Commonly known as:  DITROPAN XL Take 1 tablet by mouth 2 (two) times daily.   potassium chloride 10 MEQ tablet Commonly known as:  K-DUR TAKE 1 TABLET BY MOUTH TWICE DAILY   spironolactone 50 MG tablet Commonly known as:  ALDACTONE Take 50 mg by mouth every morning.   SYMBICORT 160-4.5 MCG/ACT inhaler Generic drug:  budesonide-formoterol INHALE 2 PUFFS BY MOUTH TWICE DAILY.   traMADol 50 MG tablet Commonly known as:  ULTRAM Take 1 tablet (50 mg total) by mouth every 6 (six) hours as needed. Pain   traZODone 100 MG tablet Commonly known as:  DESYREL Take 100 mg by mouth at bedtime.   VENTOLIN HFA 108 (90 Base) MCG/ACT inhaler Generic drug:  albuterol INHALE 2 PUFFS BY MOUTH EVERY 4 HOURS AS NEEDED   zolpidem 10 MG tablet Commonly known as:  AMBIEN          Objective:    BP 136/88   Pulse 90   Temp 98.8 F (37.1 C) (Oral)   Ht 5\' 2"  (1.575 m)   Wt (!) 375 lb (170.1 kg)   BMI 68.59 kg/m   No Known Allergies  Physical Exam  Constitutional: She is  oriented to person, place, and time. She appears well-developed.  Obese, wheelchair bound  HENT:  Head: Normocephalic and atraumatic.  Right Ear: Tympanic membrane, external ear and ear canal normal.  Left Ear: Tympanic membrane, external ear and ear canal normal.  Nose: Nose normal. No rhinorrhea.  Mouth/Throat: Oropharynx is clear and moist and mucous membranes are normal. No oropharyngeal exudate or posterior oropharyngeal erythema.  Eyes: Pupils are equal, round, and reactive to light. Conjunctivae and EOM are normal.  Neck: Normal range of motion. Neck supple.  Cardiovascular:  Normal rate, regular rhythm, normal heart sounds and intact distal pulses.   Pulmonary/Chest: Effort normal and breath sounds normal.  Abdominal: Soft. Bowel sounds are normal.  Musculoskeletal:       Lumbar back: She exhibits decreased range of motion, tenderness, pain and spasm.  Neurological: She is alert and oriented to person, place, and time. She has normal reflexes. No cranial nerve deficit or sensory deficit. She exhibits normal muscle tone.  Weakness in upper and lower extremities Right upper extremity 4/5 Left upper extremity 3/5 Right lower extremity 2/5 Left lower extremity 2/5  There are no contractures or obvious loss of mobility in joints. Gait is wide set and slow and only able to ambulate less than 10 feet.  Skin: Skin is warm and dry. No rash noted.  Psychiatric: She has a normal mood and affect. Her behavior is normal. Judgment and thought content normal.    Results for orders placed or performed in visit on 03/21/17  Bayer DCA Hb A1c Waived  Result Value Ref Range   Bayer DCA Hb A1c Waived 7.1 (H) <7.0 %      Assessment & Plan:   1. Obesity hypoventilation syndrome (HCC) Oxygen dependent  2. Obesity, morbid (more than 100 lbs over ideal weight or BMI > 40) (HCC)  3. Chronic left-sided low back pain with left-sided sciatica  4. Chronic right-sided low back pain, with sciatica  presence unspecified  5. At high risk for falls  6. Muscle weakness  Prescription for power mobility device will be given This will be faxed with all the required documents to [facility]. It is for the above diagnoses. The length of need is 99. Accessory: Oxygen holder  Current Outpatient Prescriptions:  .  atorvastatin (LIPITOR) 20 MG tablet, , Disp: , Rfl:  .  BYETTA 5 MCG PEN 5 MCG/0.02ML SOPN injection, INJECT SUBCUTANEOUSLY TWICE DAILY, Disp: 8.4 mL, Rfl: 0 .  cyclobenzaprine (FLEXERIL) 10 MG tablet, Take 1 tablet (10 mg total) by mouth 3 (three) times daily as needed for muscle spasms., Disp: 90 tablet, Rfl: 5 .  diclofenac (CATAFLAM) 50 MG tablet, Take 1 tablet by mouth daily., Disp: , Rfl:  .  FLUoxetine (PROZAC) 20 MG capsule, Take 3 capsules (60 mg total) by mouth daily., Disp: 90 capsule, Rfl: 2 .  furosemide (LASIX) 40 MG tablet, TAKE 1 TABLET BY MOUTH TWICE DAILY, Disp: 60 tablet, Rfl: 4 .  INVOKANA 300 MG TABS tablet, Take 1 tablet (300 mg total) by mouth daily before breakfast., Disp: 30 tablet, Rfl: 2 .  ipratropium-albuterol (DUONEB) 0.5-2.5 (3) MG/3ML SOLN, Take 3 mLs by nebulization every 6 (six) hours as needed., Disp: , Rfl:  .  mirtazapine (REMERON) 30 MG tablet, Take 1 tablet (30 mg total) by mouth at bedtime., Disp: 30 tablet, Rfl: 1 .  OLANZapine (ZYPREXA) 10 MG tablet, Take 10 mg by mouth at bedtime., Disp: , Rfl:  .  oxybutynin (DITROPAN XL) 15 MG 24 hr tablet, Take 1 tablet by mouth 2 (two) times daily., Disp: , Rfl:  .  potassium chloride (K-DUR) 10 MEQ tablet, TAKE 1 TABLET BY MOUTH TWICE DAILY, Disp: 60 tablet, Rfl: 4 .  spironolactone (ALDACTONE) 50 MG tablet, Take 50 mg by mouth every morning. , Disp: , Rfl:  .  SYMBICORT 160-4.5 MCG/ACT inhaler, INHALE 2 PUFFS BY MOUTH TWICE DAILY., Disp: 10.2 g, Rfl: 1 .  traMADol (ULTRAM) 50 MG tablet, Take 1 tablet (50 mg total) by mouth every 6 (six) hours  as needed. Pain, Disp: 120  tablet, Rfl: 2 .  traZODone (DESYREL) 100 MG tablet, Take 100 mg by mouth at bedtime.  , Disp: , Rfl:  .  VENTOLIN HFA 108 (90 Base) MCG/ACT inhaler, INHALE 2 PUFFS BY MOUTH EVERY 4 HOURS AS NEEDED, Disp: 18 each, Rfl: 1 .  zolpidem (AMBIEN) 10 MG tablet, , Disp: , Rfl:  Continue all other maintenance medications as listed above.  Follow up plan: Recheck 2 months  Educational handout given for survey  Remus Loffler PA-C [facility] [address]  [place], [place] [address] [phone]   04/20/2017, 11:31 AM

## 2017-05-01 ENCOUNTER — Other Ambulatory Visit: Payer: Self-pay | Admitting: Physician Assistant

## 2017-05-01 DIAGNOSIS — G8929 Other chronic pain: Secondary | ICD-10-CM

## 2017-05-01 DIAGNOSIS — M5442 Lumbago with sciatica, left side: Principal | ICD-10-CM

## 2017-05-04 NOTE — Telephone Encounter (Signed)
Phoned in.

## 2017-05-22 ENCOUNTER — Telehealth: Payer: Self-pay | Admitting: *Deleted

## 2017-05-22 ENCOUNTER — Other Ambulatory Visit: Payer: Self-pay | Admitting: Physician Assistant

## 2017-05-22 DIAGNOSIS — G8929 Other chronic pain: Secondary | ICD-10-CM

## 2017-05-22 DIAGNOSIS — M5442 Lumbago with sciatica, left side: Principal | ICD-10-CM

## 2017-05-22 MED ORDER — ALBUTEROL SULFATE HFA 108 (90 BASE) MCG/ACT IN AERS: 2.0000 | INHALATION_SPRAY | Freq: Four times a day (QID) | RESPIRATORY_TRACT | 0 refills | Status: DC | PRN

## 2017-05-22 NOTE — Telephone Encounter (Signed)
I thought [facility] not [facility]. Okay to send whatever albuterol needed.

## 2017-05-22 NOTE — Telephone Encounter (Signed)
Patient is on Medicare part D Ventolin HFA 108 mcg/inh aer is not covered recommendations - Proair HFA or Proair respiclick  Please advise and send in new RX

## 2017-05-22 NOTE — Telephone Encounter (Signed)
Inhaler sent to pharmacy

## 2017-06-01 ENCOUNTER — Ambulatory Visit (INDEPENDENT_AMBULATORY_CARE_PROVIDER_SITE_OTHER): Payer: Medicare Other | Admitting: Family

## 2017-06-01 ENCOUNTER — Encounter: Payer: Self-pay | Admitting: Family

## 2017-06-01 VITALS — BP 138/86 | HR 92 | Temp 97.5°F | Wt 367.4 lb

## 2017-06-01 DIAGNOSIS — H109 Unspecified conjunctivitis: Secondary | ICD-10-CM

## 2017-06-01 DIAGNOSIS — K2900 Acute gastritis without bleeding: Secondary | ICD-10-CM | POA: Diagnosis not present

## 2017-06-01 DIAGNOSIS — R1011 Right upper quadrant pain: Secondary | ICD-10-CM | POA: Diagnosis not present

## 2017-06-01 DIAGNOSIS — R1030 Lower abdominal pain, unspecified: Secondary | ICD-10-CM | POA: Diagnosis not present

## 2017-06-01 LAB — MICROSCOPIC EXAMINATION: RENAL EPITHEL UA: NONE SEEN /HPF

## 2017-06-01 LAB — URINALYSIS, COMPLETE
BILIRUBIN UA: NEGATIVE
Ketones, UA: NEGATIVE
NITRITE UA: NEGATIVE
Protein, UA: NEGATIVE
RBC UA: NEGATIVE
UUROB: 0.2 mg/dL (ref 0.2–1.0)
pH, UA: 5.5 (ref 5.0–7.5)

## 2017-06-01 MED ORDER — POLYMYXIN B-TRIMETHOPRIM 10000-0.1 UNIT/ML-% OP SOLN: 2.0000 [drp] | OPHTHALMIC | 0 refills | Status: DC

## 2017-06-01 NOTE — Progress Notes (Signed)
   Subjective:    Patient ID: Aimee Phillips, female    DOB: April 20, 1971, 46 y.o.   MRN: [id number]  PT presents to the office today with recurrent abdominal pain. PT saw her PCP last month and was started on Prilosec. Pt states she has increased taking Goody Powders and states she has a lot of increase stress.  Abdominal Pain  This is a recurrent problem. The current episode started more than 1 month ago. The onset quality is gradual. The problem occurs intermittently. The problem has been waxing and waning. The pain is at a severity of 4/10. The pain is moderate. The abdominal pain radiates to the LUQ and RUQ. Associated symptoms include frequency. Pertinent negatives include no constipation, diarrhea, dysuria, hematuria, nausea or vomiting.  Anxiety  Patient reports no nausea.    Conjunctivitis   The current episode started yesterday. The onset was sudden. The problem occurs continuously. The problem has been gradually worsening. The problem is moderate. The symptoms are relieved by rest. Associated symptoms include eye itching, abdominal pain, eye discharge, eye pain and eye redness. Pertinent negatives include no photophobia, no constipation, no diarrhea, no nausea and no vomiting.      Review of Systems  Eyes: Positive for pain, discharge, redness and itching. Negative for photophobia.  Gastrointestinal: Positive for abdominal pain. Negative for constipation, diarrhea, nausea and vomiting.  Genitourinary: Positive for frequency. Negative for dysuria and hematuria.  All other systems reviewed and are negative.      Objective:   Physical Exam  Constitutional: She is oriented to person, place, and time. She appears well-developed and well-nourished. No distress.  Morbid obese  HENT:  Head: Normocephalic.  Eyes: Pupils are equal, round, and reactive to light. Left eye exhibits discharge. Left conjunctiva is injected.  Neck: Normal range of motion. Neck supple. No thyromegaly  present.  Cardiovascular: Normal rate, regular rhythm, normal heart sounds and intact distal pulses.   No murmur heard. Pulmonary/Chest: Effort normal and breath sounds normal. No respiratory distress. She has no wheezes.  Abdominal: Soft. Bowel sounds are normal. She exhibits no distension. There is tenderness (RUQ).  Musculoskeletal: Normal range of motion. She exhibits no edema or tenderness.  Neurological: She is alert and oriented to person, place, and time. She has normal reflexes. No cranial nerve deficit.  Skin: Skin is warm and dry.  Psychiatric: She has a normal mood and affect. Her behavior is normal. Judgment and thought content normal.  Vitals reviewed.     BP 138/86   Pulse 92   Temp (!) 97.5 F (36.4 C) (Oral)   Wt (!) 367 lb 6.4 oz (166.7 kg)   BMI 67.20 kg/m      Assessment & Plan:  1. RUQ pain - CBC with Differential/Platelet  2. Lower abdominal pain - Urinalysis, Complete - Urine Culture  3. Acute gastritis without hemorrhage, unspecified gastritis type Continue prilosec 20 mg daily -Diet discussed- Avoid fried, spicy, citrus foods, caffeine and alcohol -Do not eat 2-3 hours before bedtime -Encouraged small frequent meals -Avoid NSAID's  4. Bacterial conjunctivitis Do not rub eyes Cool compresses  Good hand hygiene discussed - trimethoprim-polymyxin b (POLYTRIM) ophthalmic solution; Place 2 drops into both eyes every 4 (four) hours.  Dispense: 10 mL; Refill: 0    Jannifer Rodney, FNP

## 2017-06-01 NOTE — Patient Instructions (Signed)
Gastritis, Adult Gastritis is inflammation of the stomach. There are two kinds of gastritis:  Acute gastritis. This kind develops suddenly.  Chronic gastritis. This kind lasts for a long time.  Gastritis happens when the lining of the stomach becomes weak or gets damaged. Without treatment, gastritis can lead to stomach bleeding and ulcers. What are the causes? This condition may be caused by:  An infection.  Drinking too much alcohol.  Certain medicines.  Having too much acid in the stomach.  A disease of the intestines or stomach.  Stress.  What are the signs or symptoms? Symptoms of this condition include:  Pain or a burning in the upper abdomen.  Nausea.  Vomiting.  An uncomfortable feeling of fullness after eating.  In some cases, there are no symptoms. How is this diagnosed? This condition may be diagnosed with:  A description of your symptoms.  A physical exam.  Tests. These can include: ? Blood tests. ? Stool tests. ? A test in which a thin, flexible instrument with a light and camera on the end is passed down the esophagus and into the stomach (upper endoscopy). ? A test in which a sample of tissue is taken for testing (biopsy).  How is this treated? This condition may be treated with medicines. If the condition is caused by a bacterial infection, you may be given antibiotic medicines. If it is caused by too much acid in the stomach, you may get medicines called H2 blockers, proton pump inhibitors, or antacids. Treatment may also involve stopping the use of certain medicines, such as aspirin, ibuprofen, or other nonsteroidal anti-inflammatory drugs (NSAIDs). Follow these instructions at home:  Take over-the-counter and prescription medicines only as told by your health care provider.  If you were prescribed an antibiotic, take it as told by your health care provider. Do not stop taking the antibiotic even if you start to feel better.  Drink enough  fluid to keep your urine clear or pale yellow.  Eat small, frequent meals instead of large meals. Contact a health care provider if:  Your symptoms get worse.  Your symptoms return after treatment. Get help right away if:  You vomit blood or material that looks like coffee grounds.  You have black or dark red stools.  You are unable to keep fluids down.  Your abdominal pain gets worse.  You have a fever.  You do not feel better after 1 week. This information is not intended to replace advice given to you by your health care provider. Make sure you discuss any questions you have with your health care provider. Document Released: 08/22/2001 Document Revised: 04/26/2016 Document Reviewed: 05/22/2015 Elsevier Interactive Patient Education  2018 Elsevier Inc.  

## 2017-06-02 LAB — CBC WITH DIFFERENTIAL/PLATELET
BASOS: 0 %
Basophils Absolute: 0 10*3/uL (ref 0.0–0.2)
EOS (ABSOLUTE): 0.1 10*3/uL (ref 0.0–0.4)
Eos: 1 %
HEMATOCRIT: 46.1 % (ref 34.0–46.6)
HEMOGLOBIN: 15 g/dL (ref 11.1–15.9)
IMMATURE GRANS (ABS): 0 10*3/uL (ref 0.0–0.1)
Immature Granulocytes: 0 %
LYMPHS ABS: 3.8 10*3/uL — AB (ref 0.7–3.1)
LYMPHS: 36 %
MCH: 28.7 pg (ref 26.6–33.0)
MCHC: 32.5 g/dL (ref 31.5–35.7)
MCV: 88 fL (ref 79–97)
MONOCYTES: 8 %
Monocytes Absolute: 0.8 10*3/uL (ref 0.1–0.9)
NEUTROS ABS: 5.5 10*3/uL (ref 1.4–7.0)
Neutrophils: 55 %
Platelets: 291 10*3/uL (ref 150–379)
RBC: 5.22 x10E6/uL (ref 3.77–5.28)
RDW: 13.7 % (ref 12.3–15.4)
WBC: 10.3 10*3/uL (ref 3.4–10.8)

## 2017-06-03 LAB — URINE CULTURE

## 2017-06-04 ENCOUNTER — Other Ambulatory Visit: Payer: Self-pay | Admitting: Family

## 2017-06-04 MED ORDER — SULFAMETHOXAZOLE-TRIMETHOPRIM 800-160 MG PO TABS: 1.0000 | ORAL_TABLET | Freq: Two times a day (BID) | ORAL | 0 refills | Status: DC

## 2017-06-04 NOTE — Progress Notes (Signed)
Patient aware.

## 2017-06-13 ENCOUNTER — Other Ambulatory Visit: Payer: Self-pay | Admitting: Physician Assistant

## 2017-06-13 DIAGNOSIS — G8929 Other chronic pain: Secondary | ICD-10-CM

## 2017-06-13 DIAGNOSIS — M5442 Lumbago with sciatica, left side: Principal | ICD-10-CM

## 2017-06-18 NOTE — Telephone Encounter (Signed)
Rx called into [facility] [place] per patients request

## 2017-07-16 ENCOUNTER — Other Ambulatory Visit: Payer: Self-pay | Admitting: *Deleted

## 2017-07-16 DIAGNOSIS — F3132 Bipolar disorder, current episode depressed, moderate: Secondary | ICD-10-CM

## 2017-07-16 MED ORDER — MIRTAZAPINE 30 MG PO TABS: 30.0000 mg | ORAL_TABLET | Freq: Every day | ORAL | 1 refills | Status: DC

## 2017-08-03 ENCOUNTER — Telehealth (HOSPITAL_COMMUNITY): Payer: Self-pay

## 2017-08-30 ENCOUNTER — Other Ambulatory Visit: Payer: Self-pay | Admitting: Physician Assistant

## 2017-08-30 ENCOUNTER — Encounter: Payer: Self-pay | Admitting: Family Medicine

## 2017-08-30 ENCOUNTER — Ambulatory Visit (INDEPENDENT_AMBULATORY_CARE_PROVIDER_SITE_OTHER): Payer: Medicare Other | Admitting: Family Medicine

## 2017-08-30 VITALS — BP 131/81 | HR 91 | Temp 99.1°F

## 2017-08-30 DIAGNOSIS — R1010 Upper abdominal pain, unspecified: Secondary | ICD-10-CM | POA: Diagnosis not present

## 2017-08-30 DIAGNOSIS — F3132 Bipolar disorder, current episode depressed, moderate: Secondary | ICD-10-CM

## 2017-08-30 NOTE — Progress Notes (Signed)
   HPI  Patient presents today here in after Hours clinic for chronic abdominal pain.  Patient explains that she has had generalized but primarily bilateral upper quadrant abdominal pain.  She states it gets worse at night.  She denies fever, chills, sweats.  It does not get worse with eating. She tried a course of Bactrim and has been on Prilosec since September with no improvement.  She has not had any imaging.  She denies any previous visits with GI.  Denies alcohol use or smoking   PMH: Smoking status noted ROS: Per HPI  Objective: BP 131/81   Pulse 91   Temp 99.1 F (37.3 C) (Oral)  Gen: NAD, alert, cooperative with exam HEENT: NCAT, EOMI, PERRL CV: RRR, good S1/S2, no murmur Resp: CTABL, no wheezes, non-labored Abd: Soft, no guarding, positive bowel sounds, tenderness to palpation throughout but most impressive in the bilateral upper quadrants and epigastric area Ext: No edema, warm Neuro: Alert and oriented, No gross deficits  Assessment and plan:  #Pain of upper abdomen Bilateral upper quadrant abdominal pain as well as epigastric pain. Generalized on her description, however exam is most impressive in the upper area. Labs today Ultrasound Refer to GI for their opinion   Orders Placed This Encounter  Procedures  . US Abdomen Complete    Standing Status:   Future    Standing Expiration Date:   10/31/2018    Order Specific Question:   Reason for Exam (SYMPTOM  OR DIAGNOSIS REQUIRED)    Answer:   Upper abd pain X 4 months    Order Specific Question:   Preferred imaging location?    Answer:   [facility]  . Lipase  . CMP14+EGFR  . CBC with Differential/Platelet  . Ambulatory referral to Gastroenterology    Referral Priority:   Routine    Referral Type:   Consultation    Referral Reason:   Specialty Services Required    Number of Visits Requested:   1    No orders of the defined types were placed in this encounter.   Laroy Apple, MD [facility] 08/30/2017, 5:04 PM

## 2017-08-30 NOTE — Patient Instructions (Signed)
Great to meet you!   Abdominal Pain, Adult Many things can cause belly (abdominal) pain. Most times, belly pain is not dangerous. Many cases of belly pain can be watched and treated at home. Sometimes belly pain is serious, though. Your doctor will try to find the cause of your belly pain. Follow these instructions at home:  Take over-the-counter and prescription medicines only as told by your doctor. Do not take medicines that help you poop (laxatives) unless told to by your doctor.  Drink enough fluid to keep your pee (urine) clear or pale yellow.  Watch your belly pain for any changes.  Keep all follow-up visits as told by your doctor. This is important. Contact a doctor if:  Your belly pain changes or gets worse.  You are not hungry, or you lose weight without trying.  You are having trouble pooping (constipated) or have watery poop (diarrhea) for more than 2-3 days.  You have pain when you pee or poop.  Your belly pain wakes you up at night.  Your pain gets worse with meals, after eating, or with certain foods.  You are throwing up and cannot keep anything down.  You have a fever. Get help right away if:  Your pain does not go away as soon as your doctor says it should.  You cannot stop throwing up.  Your pain is only in areas of your belly, such as the right side or the left lower part of the belly.  You have bloody or black poop, or poop that looks like tar.  You have very bad pain, cramping, or bloating in your belly.  You have signs of not having enough fluid or water in your body (dehydration), such as: ? Dark pee, very little pee, or no pee. ? Cracked lips. ? Dry mouth. ? Sunken eyes. ? Sleepiness. ? Weakness. This information is not intended to replace advice given to you by your health care provider. Make sure you discuss any questions you have with your health care provider. Document Released: 02/14/2008 Document Revised: 03/17/2016 Document Reviewed:  02/09/2016 Elsevier Interactive Patient Education  2018 Elsevier Inc.  

## 2017-08-31 ENCOUNTER — Telehealth: Payer: Self-pay | Admitting: Physician Assistant

## 2017-08-31 ENCOUNTER — Other Ambulatory Visit: Payer: Self-pay | Admitting: Physician Assistant

## 2017-08-31 LAB — CBC WITH DIFFERENTIAL/PLATELET
Basophils Absolute: 0 10*3/uL (ref 0.0–0.2)
Basos: 0 %
EOS (ABSOLUTE): 0.1 10*3/uL (ref 0.0–0.4)
EOS: 1 %
HEMATOCRIT: 44.3 % (ref 34.0–46.6)
HEMOGLOBIN: 14.6 g/dL (ref 11.1–15.9)
IMMATURE GRANULOCYTES: 0 %
Immature Grans (Abs): 0 10*3/uL (ref 0.0–0.1)
LYMPHS: 31 %
Lymphocytes Absolute: 2.9 10*3/uL (ref 0.7–3.1)
MCH: 29 pg (ref 26.6–33.0)
MCHC: 33 g/dL (ref 31.5–35.7)
MCV: 88 fL (ref 79–97)
MONOCYTES: 7 %
Monocytes Absolute: 0.6 10*3/uL (ref 0.1–0.9)
NEUTROS PCT: 61 %
Neutrophils Absolute: 5.7 10*3/uL (ref 1.4–7.0)
Platelets: 275 10*3/uL (ref 150–379)
RBC: 5.03 x10E6/uL (ref 3.77–5.28)
RDW: 13.5 % (ref 12.3–15.4)
WBC: 9.4 10*3/uL (ref 3.4–10.8)

## 2017-08-31 LAB — CMP14+EGFR
ALT: 32 IU/L (ref 0–32)
AST: 46 IU/L — AB (ref 0–40)
Albumin/Globulin Ratio: 1.2 (ref 1.2–2.2)
Albumin: 4.2 g/dL (ref 3.5–5.5)
Alkaline Phosphatase: 104 IU/L (ref 39–117)
BUN/Creatinine Ratio: 11 (ref 9–23)
BUN: 9 mg/dL (ref 6–24)
Bilirubin Total: <0.2 mg/dL (ref 0.0–1.2)
CO2: 24 mmol/L (ref 20–29)
CREATININE: 0.81 mg/dL (ref 0.57–1.00)
Calcium: 9.6 mg/dL (ref 8.7–10.2)
Chloride: 94 mmol/L — ABNORMAL LOW (ref 96–106)
GFR calc Af Amer: 101 mL/min/{1.73_m2} (ref >59)
GFR calc non Af Amer: 88 mL/min/{1.73_m2} (ref >59)
GLUCOSE: 315 mg/dL — AB (ref 65–99)
Globulin, Total: 3.5 g/dL (ref 1.5–4.5)
Potassium: 3.7 mmol/L (ref 3.5–5.2)
Sodium: 136 mmol/L (ref 134–144)
Total Protein: 7.7 g/dL (ref 6.0–8.5)

## 2017-08-31 LAB — LIPASE: Lipase: 64 U/L (ref 14–72)

## 2017-08-31 MED ORDER — BLOOD GLUCOSE METER KIT: PACK | 0 refills | Status: DC

## 2017-08-31 MED ORDER — BLOOD GLUCOSE MONITORING SUPPL DEVI: 0 refills | Status: DC

## 2017-08-31 NOTE — Telephone Encounter (Signed)
Patient aware rx sent to pharmacy.  

## 2017-08-31 NOTE — Telephone Encounter (Signed)
What is the name of the medication? Meter,lancets and strips for ACCU-check Plus  Have you contacted your pharmacy to request a refill? YES  Which pharmacy would you like this sent to[facility]   Patient notified that their request is being sent to the clinical staff for review and that they should receive a call once it is complete. If they do not receive a call within 24 hours they can check with their pharmacy or our office.

## 2017-08-31 NOTE — Telephone Encounter (Signed)
Patient aware of results.

## 2017-09-05 ENCOUNTER — Other Ambulatory Visit: Payer: Self-pay | Admitting: *Deleted

## 2017-09-05 MED ORDER — LANCET DEVICES MISC: 99 refills | Status: DC

## 2017-09-05 MED ORDER — GLUCOSE BLOOD VI STRP: ORAL_STRIP | 12 refills | Status: DC

## 2017-09-06 ENCOUNTER — Other Ambulatory Visit: Payer: Self-pay | Admitting: *Deleted

## 2017-09-06 DIAGNOSIS — E1169 Type 2 diabetes mellitus with other specified complication: Secondary | ICD-10-CM

## 2017-09-06 DIAGNOSIS — F3132 Bipolar disorder, current episode depressed, moderate: Secondary | ICD-10-CM

## 2017-09-06 DIAGNOSIS — E785 Hyperlipidemia, unspecified: Principal | ICD-10-CM

## 2017-09-06 MED ORDER — GLUCOSE BLOOD VI STRP: ORAL_STRIP | 12 refills | Status: DC

## 2017-09-06 MED ORDER — TRAZODONE HCL 100 MG PO TABS: 100.0000 mg | ORAL_TABLET | Freq: Every day | ORAL | 0 refills | Status: DC

## 2017-09-06 MED ORDER — OLANZAPINE 10 MG PO TABS: 10.0000 mg | ORAL_TABLET | Freq: Every day | ORAL | 0 refills | Status: DC

## 2017-09-06 MED ORDER — SPIRONOLACTONE 50 MG PO TABS: 50.0000 mg | ORAL_TABLET | ORAL | 0 refills | Status: DC

## 2017-09-06 MED ORDER — ATORVASTATIN CALCIUM 20 MG PO TABS: 20.0000 mg | ORAL_TABLET | Freq: Every day | ORAL | 0 refills | Status: DC

## 2017-09-07 ENCOUNTER — Other Ambulatory Visit: Payer: Self-pay

## 2017-09-07 MED ORDER — OXYBUTYNIN CHLORIDE ER 15 MG PO TB24: 15.0000 mg | ORAL_TABLET | Freq: Two times a day (BID) | ORAL | 0 refills | Status: DC

## 2017-09-07 MED ORDER — DICLOFENAC POTASSIUM 50 MG PO TABS: 50.0000 mg | ORAL_TABLET | Freq: Two times a day (BID) | ORAL | 0 refills | Status: DC

## 2017-09-13 ENCOUNTER — Other Ambulatory Visit: Payer: Self-pay | Admitting: Physician Assistant

## 2017-09-13 DIAGNOSIS — F3132 Bipolar disorder, current episode depressed, moderate: Secondary | ICD-10-CM

## 2017-09-17 ENCOUNTER — Ambulatory Visit (HOSPITAL_COMMUNITY)
Admission: RE | Admit: 2017-09-17 | Discharge: 2017-09-17 | Disposition: A | Discharge status: Home / Self Care | Payer: Medicare Other | Source: Ambulatory Visit | Attending: Family Medicine | Admitting: Family Medicine

## 2017-09-17 ENCOUNTER — Encounter (INDEPENDENT_AMBULATORY_CARE_PROVIDER_SITE_OTHER): Payer: Self-pay | Admitting: Internal Medicine

## 2017-09-17 ENCOUNTER — Ambulatory Visit (INDEPENDENT_AMBULATORY_CARE_PROVIDER_SITE_OTHER): Payer: Medicare Other | Admitting: Internal Medicine

## 2017-09-17 DIAGNOSIS — R1010 Upper abdominal pain, unspecified: Secondary | ICD-10-CM

## 2017-09-17 LAB — CBC WITH DIFFERENTIAL/PLATELET
BASOS ABS: 50 {cells}/uL (ref 0–200)
Basophils Relative: 0.5 %
EOS ABS: 109 {cells}/uL (ref 15–500)
Eosinophils Relative: 1.1 %
HEMATOCRIT: 46.7 % — AB (ref 35.0–45.0)
HEMOGLOBIN: 14.8 g/dL (ref 11.7–15.5)
LYMPHS ABS: 2247 {cells}/uL (ref 850–3900)
MCH: 28.5 pg (ref 27.0–33.0)
MCHC: 31.7 g/dL — AB (ref 32.0–36.0)
MCV: 89.8 fL (ref 80.0–100.0)
MONOS PCT: 5.7 %
MPV: 12.6 fL — ABNORMAL HIGH (ref 7.5–12.5)
NEUTROS ABS: 6930 {cells}/uL (ref 1500–7800)
Neutrophils Relative %: 70 %
Platelets: 248 10*3/uL (ref 140–400)
RBC: 5.2 10*6/uL — ABNORMAL HIGH (ref 3.80–5.10)
RDW: 12.3 % (ref 11.0–15.0)
Total Lymphocyte: 22.7 %
WBC: 9.9 10*3/uL (ref 3.8–10.8)
WBCMIX: 564 {cells}/uL (ref 200–950)

## 2017-09-17 LAB — HEPATIC FUNCTION PANEL
AG Ratio: 1.2 (calc) (ref 1.0–2.5)
ALT: 24 U/L (ref 6–29)
AST: 41 U/L — ABNORMAL HIGH (ref 10–35)
Albumin: 3.8 g/dL (ref 3.6–5.1)
Alkaline phosphatase (APISO): 88 U/L (ref 33–115)
Bilirubin, Direct: 0.1 mg/dL (ref 0.0–0.2)
GLOBULIN: 3.1 g/dL (ref 1.9–3.7)
Indirect Bilirubin: 0.1 mg/dL (calc) — ABNORMAL LOW (ref 0.2–1.2)
Total Bilirubin: 0.2 mg/dL (ref 0.2–1.2)
Total Protein: 6.9 g/dL (ref 6.1–8.1)

## 2017-09-17 LAB — LIPASE: Lipase: 43 U/L (ref 7–60)

## 2017-09-17 LAB — AMYLASE: AMYLASE: 99 U/L (ref 21–101)

## 2017-09-17 NOTE — Progress Notes (Signed)
Subjective:    Patient ID: Aimee Phillips, female    DOB: 02-10-1971, 47 y.o.   MRN: [id number] PCP Dr. Wendi Snipes HPI Referred by Dr. Wendi Snipes for epigastric pain and radiates down into her RLQ. She has been having pain x 4 months.  The pain is not related to eating. Nothing really aggravates it. She says when it starts to get dark, the pain seems to increase. Her appetite is okay. No weight loss.   She is having a BM x 1 a day. No melena or BRRB.  No family hx of colon cancer. Mother had breast cancer.  Some acid reflux but really not much. She can eat anything she wants.  She is on oxygen at home.    Hx of cholecystectomy    Bipolar.  Hx of diabetes since 2008  CMP Latest Ref Rng & Units 08/30/2017 01/31/2017 09/05/2013  Glucose 65 - 99 mg/dL 315(H) 186(H) 210(H)  BUN 6 - 24 mg/dL _0 Creatinine 0.57 - 1.00 mg/dL 0.81 0.70 0.77  Sodium 134 - 144 mmol/L 136 137 132(L)  Potassium 3.5 - 5.2 mmol/L 3.7 4.9 3.6  Chloride 96 - 106 mmol/L 94(L) 96 89(L)  CO2 20 - 29 mmol/L 24 31(H) 35(H)  Calcium 8.7 - 10.2 mg/dL 9.6 9.5 9.2  Total Protein 6.0 - 8.5 g/dL 7.7 7.1 -  Total Bilirubin 0.0 - 1.2 mg/dL <0.2 <0.2 -  Alkaline Phos 39 - 117 IU/L 104 103 -  AST 0 - 40 IU/L 46(H) 39 -  ALT 0 - 32 IU/L 32 27 -   CBC    Component Value Date/Time   WBC 9.4 08/30/2017 1707   WBC 8.4 09/05/2013 1436   RBC 5.03 08/30/2017 1707   RBC 4.76 09/05/2013 1436   HGB 14.6 08/30/2017 1707   HCT 44.3 08/30/2017 1707   PLT 275 08/30/2017 1707   MCV 88 08/30/2017 1707   MCH 29.0 08/30/2017 1707   MCH 29.8 09/05/2013 1436   MCHC 33.0 08/30/2017 1707   MCHC 30.7 09/05/2013 1436   RDW 13.5 08/30/2017 1707   LYMPHSABS 2.9 08/30/2017 1707   MONOABS 0.5 09/05/2013 1436   EOSABS 0.1 08/30/2017 1707   BASOSABS 0.0 08/30/2017 1707     US revealed 09/17/2017 revealed: IMPRESSION: Previous cholecystectomy. Increased hepatic echotexture most compatible with fatty infiltrative change. Mild prominence  of the pancreatic duct without evidence of a pancreatic mass or inflammatory change. If there are clinical concerns of chronic pancreatitis, pancreatic protocol MRI would be a useful next imaging step.  Review of Systems Past Medical History:  Diagnosis Date  . Asthma   . Bipolar affect, depressed ([place])   . Cellulitis   . COPD (chronic obstructive pulmonary disease) ([place])   . Depression   . PE (pulmonary embolism)   . Pulmonary edema     Past Surgical History:  Procedure Laterality Date  . CESAREAN SECTION    . CHOLECYSTECTOMY      No Known Allergies  Current Outpatient Medications on File Prior to Visit  Medication Sig Dispense Refill  . albuterol (PROVENTIL HFA;VENTOLIN HFA) 108 (90 Base) MCG/ACT inhaler Inhale 2 puffs into the lungs every 6 (six) hours as needed for wheezing or shortness of breath. 1 Inhaler 0  . atorvastatin (LIPITOR) 20 MG tablet Take 1 tablet (20 mg total) by mouth daily at 6 PM. 90 tablet 0  . blood glucose meter kit and supplies Check BS up to four times a day 1 each 0  .  Blood Glucose Monitoring Suppl DEVI Check bs up to four times a day 1 each 0  . BYETTA 5 MCG PEN 5 MCG/0.02ML SOPN injection INJECT 5MCG SUBCUTANEOUSLY TWICE DAILY 8.4 mL 0  . cyclobenzaprine (FLEXERIL) 10 MG tablet Take 1 tablet (10 mg total) by mouth 3 (three) times daily as needed for muscle spasms. 90 tablet 5  . diclofenac (CATAFLAM) 50 MG tablet Take 1 tablet (50 mg total) by mouth 2 (two) times daily. 60 tablet 0  . FLUoxetine (PROZAC) 20 MG capsule TAKE THREE (3) CAPSULES BY MOUTH ONCE DAILY. (MORNING) 90 capsule 0  . furosemide (LASIX) 40 MG tablet TAKE 1 TABLET BY MOUTH TWICE DAILY 60 tablet 4  . glucose blood (ACCU-CHEK AVIVA) test strip Use to check blood sugar four times daily 200 each 12  . INVOKANA 300 MG TABS tablet Take 1 tablet (300 mg total) by mouth daily before breakfast. 30 tablet 2  . ipratropium-albuterol (DUONEB) 0.5-2.5 (3) MG/3ML SOLN Take 3 mLs by  nebulization every 6 (six) hours as needed.    . Lancet Devices MISC Test QID, DX E11.65, Aviva Plus 100 each prn  . mirtazapine (REMERON) 30 MG tablet TAKE ONE TABLET BY MOUTH AT BEDTIME 30 tablet 5  . OLANZapine (ZYPREXA) 10 MG tablet Take 1 tablet (10 mg total) by mouth at bedtime. 90 tablet 0  . omeprazole (PRILOSEC) 20 MG capsule TAKE ONE CAPSULE BY MOUTH ONCE DAILY. (MORNING) 30 capsule 5  . oxybutynin (DITROPAN XL) 15 MG 24 hr tablet Take 1 tablet (15 mg total) by mouth 2 (two) times daily. 30 tablet 0  . potassium chloride (K-DUR) 10 MEQ tablet TAKE 1 TABLET BY MOUTH TWICE DAILY 60 tablet 4  . spironolactone (ALDACTONE) 50 MG tablet Take 1 tablet (50 mg total) by mouth every morning. 90 tablet 0  . SYMBICORT 160-4.5 MCG/ACT inhaler INHALE 2 PUFFS BY MOUTH TWICE DAILY. 10.2 g 2  . traMADol (ULTRAM) 50 MG tablet TAKE 1 TABLET BY MOUTH EVERY 6 HOURS AS NEEDED 120 tablet 0  . traZODone (DESYREL) 100 MG tablet Take 1 tablet (100 mg total) by mouth at bedtime. 90 tablet 0  . trimethoprim-polymyxin b (POLYTRIM) ophthalmic solution Place 2 drops into both eyes every 4 (four) hours. 10 mL 0  . zolpidem (AMBIEN) 10 MG tablet Take 1 tablet (10 mg total) by mouth at bedtime. 30 tablet 2   No current facility-administered medications on file prior to visit.         Objective:   Physical Exam Blood pressure 120/68, pulse 84, temperature 97.6 F (36.4 C), height 5' 8" (1.727 m), weight (!) 371 lb (168.3 kg). Alert and oriented. Skin warm and dry. Oral mucosa is moist.   . Sclera anicteric, conjunctivae is pink. Thyroid not enlarged. No cervical lymphadenopathy. Lungs clear. Heart regular rate and rhythm.  Abdomen is soft. Bowel sounds are positive. No hepatomegaly. No abdominal masses felt. Tenderness to epigastric region.   No edema to lower extremities.    Extremely obese      Assessment & Plan:  Upper abdominal pain. Will get a CT abdomen/pelvis with CM. Lipase, hepatic, amylase Further  recommendations to follow.  

## 2017-09-17 NOTE — Patient Instructions (Signed)
Labs and CT. Further recommendations to follow 

## 2017-09-19 ENCOUNTER — Ambulatory Visit (HOSPITAL_COMMUNITY)
Admission: RE | Admit: 2017-09-19 | Discharge: 2017-09-19 | Disposition: A | Discharge status: Home / Self Care | Payer: Medicare Other | Source: Ambulatory Visit | Attending: Internal Medicine | Admitting: Internal Medicine

## 2017-09-19 DIAGNOSIS — R1010 Upper abdominal pain, unspecified: Secondary | ICD-10-CM | POA: Diagnosis present

## 2017-09-19 DIAGNOSIS — N83202 Unspecified ovarian cyst, left side: Secondary | ICD-10-CM | POA: Diagnosis not present

## 2017-09-19 DIAGNOSIS — K760 Fatty (change of) liver, not elsewhere classified: Secondary | ICD-10-CM | POA: Insufficient documentation

## 2017-09-19 DIAGNOSIS — M4854XA Collapsed vertebra, not elsewhere classified, thoracic region, initial encounter for fracture: Secondary | ICD-10-CM | POA: Insufficient documentation

## 2017-09-19 MED ORDER — IOPAMIDOL (ISOVUE-300) INJECTION 61%
125.0000 mL | Freq: Once | INTRAVENOUS | Status: AC | PRN
  Administered 2017-09-19: 125 mL via INTRAVENOUS

## 2017-09-20 ENCOUNTER — Telehealth (INDEPENDENT_AMBULATORY_CARE_PROVIDER_SITE_OTHER): Payer: Self-pay | Admitting: Internal Medicine

## 2017-09-20 NOTE — Telephone Encounter (Signed)
Patient called, lmoam that she would like the results of her test.   [phone]

## 2017-09-24 NOTE — Telephone Encounter (Signed)
Patient called again today and lmoam that she would like her results.

## 2017-09-24 NOTE — Telephone Encounter (Signed)
Results have been given to patient. She will have OV in 4 weeks

## 2017-09-25 ENCOUNTER — Encounter (INDEPENDENT_AMBULATORY_CARE_PROVIDER_SITE_OTHER): Payer: Self-pay | Admitting: Internal Medicine

## 2017-09-25 NOTE — Telephone Encounter (Signed)
Follow up appointment for 10/23/17 at 11:30am was given to the patient.  A Letter was mailed to the patient.

## 2017-10-02 ENCOUNTER — Telehealth: Payer: Self-pay | Admitting: Physician Assistant

## 2017-10-02 DIAGNOSIS — R52 Pain, unspecified: Secondary | ICD-10-CM

## 2017-10-02 NOTE — Telephone Encounter (Signed)
Good idea. 

## 2017-10-02 NOTE — Telephone Encounter (Signed)
Patient is requesting referral to Dr. Gerilyn Pilgr[place]quah for pain management?

## 2017-10-02 NOTE — Telephone Encounter (Signed)
Patient aware that referral has been placed.  

## 2017-10-08 ENCOUNTER — Ambulatory Visit: Payer: Medicare Other | Admitting: Family Medicine

## 2017-10-11 ENCOUNTER — Encounter: Payer: Self-pay | Admitting: Physician Assistant

## 2017-10-22 ENCOUNTER — Encounter: Payer: Self-pay | Admitting: Obstetrics & Gynecology

## 2017-10-23 ENCOUNTER — Encounter (INDEPENDENT_AMBULATORY_CARE_PROVIDER_SITE_OTHER): Payer: Self-pay | Admitting: Internal Medicine

## 2017-10-23 ENCOUNTER — Ambulatory Visit (INDEPENDENT_AMBULATORY_CARE_PROVIDER_SITE_OTHER): Payer: Medicare Other | Admitting: Internal Medicine

## 2017-10-23 ENCOUNTER — Ambulatory Visit: Payer: Medicare Other | Admitting: Physician Assistant

## 2017-12-18 ENCOUNTER — Encounter: Payer: Self-pay | Admitting: Obstetrics & Gynecology

## 2018-01-06 ENCOUNTER — Encounter: Payer: Self-pay | Admitting: Obstetrics & Gynecology

## 2018-01-18 ENCOUNTER — Encounter: Payer: Self-pay | Admitting: Physician Assistant

## 2018-01-18 ENCOUNTER — Ambulatory Visit (INDEPENDENT_AMBULATORY_CARE_PROVIDER_SITE_OTHER): Payer: Medicare Other | Admitting: Physician Assistant

## 2018-01-18 ENCOUNTER — Telehealth: Payer: Self-pay | Admitting: Physician Assistant

## 2018-01-18 VITALS — BP 130/79 | HR 113 | Temp 97.2°F | Ht 68.0 in | Wt 344.0 lb

## 2018-01-18 DIAGNOSIS — R102 Pelvic and perineal pain unspecified side: Secondary | ICD-10-CM | POA: Insufficient documentation

## 2018-01-18 DIAGNOSIS — M545 Low back pain: Secondary | ICD-10-CM

## 2018-01-18 DIAGNOSIS — N83202 Unspecified ovarian cyst, left side: Secondary | ICD-10-CM | POA: Diagnosis not present

## 2018-01-18 DIAGNOSIS — E1165 Type 2 diabetes mellitus with hyperglycemia: Secondary | ICD-10-CM

## 2018-01-18 DIAGNOSIS — E662 Morbid (severe) obesity with alveolar hypoventilation: Secondary | ICD-10-CM | POA: Diagnosis not present

## 2018-01-18 DIAGNOSIS — R0609 Other forms of dyspnea: Secondary | ICD-10-CM

## 2018-01-18 DIAGNOSIS — G8929 Other chronic pain: Secondary | ICD-10-CM

## 2018-01-18 DIAGNOSIS — M5442 Lumbago with sciatica, left side: Secondary | ICD-10-CM | POA: Diagnosis not present

## 2018-01-18 MED ORDER — INSULIN GLARGINE 100 UNIT/ML SOLOSTAR PEN: 15.0000 [IU] | PEN_INJECTOR | SUBCUTANEOUS | 99 refills | Status: DC

## 2018-01-18 MED ORDER — LISINOPRIL 5 MG PO TABS: 5.0000 mg | ORAL_TABLET | Freq: Every day | ORAL | 3 refills | Status: DC

## 2018-01-18 MED ORDER — TRAMADOL HCL 50 MG PO TABS: 50.0000 mg | ORAL_TABLET | Freq: Four times a day (QID) | ORAL | 2 refills | Status: DC | PRN

## 2018-01-18 NOTE — Telephone Encounter (Signed)
noted 

## 2018-01-18 NOTE — Telephone Encounter (Signed)
This is a FYI from [place] from [employer], called to let Lawanna Kobus know of the visit that she had with the patient on 01/16/2018. Her A1C was 12.7. They are recommending that the pt be put on ACE or ARB to protect Kidneys. They are thinking that an endocrinologist could be beneficial for the pt.  They recommend referrals to pain mgmt and Mental Health. Her Depression Score was 8 on Wednesday. Bjorn Loser also reports that the pt was complaining of chest pains when she gets  Upset. And she was complaining of abdominal pain. They discussed movement and weight loss as well.

## 2018-01-18 NOTE — Progress Notes (Signed)
BP 130/79   Pulse (!) 113   Temp (!) 97.2 F (36.2 C) (Oral)   Ht _0  (1.727 m)   Wt (!) 344 lb (156 kg)   BMI 52.31 kg/m    Subjective:    Patient ID: Aimee Phillips, female    DOB: Feb 19, 1971, 47 y.o.   MRN: [id number]  HPI: Aimee Phillips is a 47 y.o. female presenting on 01/18/2018 for Diabetes (Discuss possible diabetic medication options )  This patient comes in for multiple health problems.  She has been dealing with some lower abdominal pain.  Her CT scan showed a very large cyst a few months ago 4.6 cm.  She has been seen by gynecology.  They did do a scope and found that she had polyps.  However they did not want to do surgery at this time.  She would like to see a different gynecologist.  We will make a referral for her.  She also has not been doing very well with her diabetes.  Her A1c had been up when she had home health nurse come out and do an evaluation.  Therefore we will have her start Lantus 15 mg daily and increase by 1 unit a day until her fasting blood sugars are close to 100.  We will plan to recheck her in 2 months on this.  We will do an A1c at that time.  She is also had difficulty with her breathing over the years but she has not been to a pulmonologist.  She does have to use inhalers at times.  She has had recurrent bronchitis is in the past.  She has been fairly clear in recent months.  If surgery is going to be performed she is going to need pulmonary clearance of working to go ahead and get her scheduled for that.   Past Medical History:  Diagnosis Date  . Asthma   . Bipolar affect, depressed ([place])   . Cellulitis   . COPD (chronic obstructive pulmonary disease) ([place])   . Depression   . PE (pulmonary embolism)   . Pulmonary edema    Relevant past medical, surgical, family and social history reviewed and updated as indicated. Interim medical history since our last visit reviewed. Allergies and medications reviewed and updated. DATA REVIEWED:  CHART IN EPIC  Family History reviewed for pertinent findings.  Review of Systems  Constitutional: Positive for fatigue. Negative for fever.  HENT: Negative.   Eyes: Negative.   Respiratory: Positive for cough, shortness of breath and wheezing.   Cardiovascular: Negative for chest pain and leg swelling.  Gastrointestinal: Positive for abdominal pain. Negative for constipation and diarrhea.  Genitourinary: Positive for pelvic pain. Negative for menstrual problem.  Musculoskeletal: Positive for arthralgias, back pain and gait problem.    Allergies as of 01/18/2018   No Known Allergies     Medication List        Accurate as of 01/18/18  5:11 PM. Always use your most recent med list.          albuterol 108 (90 Base) MCG/ACT inhaler Commonly known as:  PROVENTIL HFA;VENTOLIN HFA Inhale 2 puffs into the lungs every 6 (six) hours as needed for wheezing or shortness of breath.   atorvastatin 20 MG tablet Commonly known as:  LIPITOR Take 1 tablet (20 mg total) by mouth daily at 6 PM.   blood glucose meter kit and supplies Check BS up to four times a day   Blood Glucose Monitoring  Suppl Devi Check bs up to four times a day   BYETTA 5 MCG PEN 5 MCG/0.02ML Sopn injection Generic drug:  exenatide INJECT 5MCG SUBCUTANEOUSLY TWICE DAILY   cyclobenzaprine 10 MG tablet Commonly known as:  FLEXERIL Take 1 tablet (10 mg total) by mouth 3 (three) times daily as needed for muscle spasms.   diclofenac 50 MG tablet Commonly known as:  CATAFLAM Take 1 tablet (50 mg total) by mouth 2 (two) times daily.   FLUoxetine 20 MG capsule Commonly known as:  PROZAC TAKE THREE (3) CAPSULES BY MOUTH ONCE DAILY. (MORNING)   furosemide 40 MG tablet Commonly known as:  LASIX TAKE 1 TABLET BY MOUTH TWICE DAILY   glucose blood test strip Commonly known as:  ACCU-CHEK AVIVA Use to check blood sugar four times daily   Insulin Glargine 100 UNIT/ML Solostar Pen Commonly known as:  LANTUS  SOLOSTAR Inject 15-50 Units into the skin 1 day or 1 dose for 1 dose.   INVOKANA 300 MG Tabs tablet Generic drug:  canagliflozin Take 1 tablet (300 mg total) by mouth daily before breakfast.   ipratropium-albuterol 0.5-2.5 (3) MG/3ML Soln Commonly known as:  DUONEB Take 3 mLs by nebulization every 6 (six) hours as needed.   Lancet Devices Misc Test QID, DX E11.65, Aviva Plus   lisinopril 5 MG tablet Commonly known as:  PRINIVIL,ZESTRIL Take 1 tablet (5 mg total) by mouth daily.   mirtazapine 30 MG tablet Commonly known as:  REMERON TAKE ONE TABLET BY MOUTH AT BEDTIME   OLANZapine 10 MG tablet Commonly known as:  ZYPREXA Take 1 tablet (10 mg total) by mouth at bedtime.   omeprazole 20 MG capsule Commonly known as:  PRILOSEC TAKE ONE CAPSULE BY MOUTH ONCE DAILY. (MORNING)   oxybutynin 15 MG 24 hr tablet Commonly known as:  DITROPAN XL Take 1 tablet (15 mg total) by mouth 2 (two) times daily.   potassium chloride 10 MEQ tablet Commonly known as:  K-DUR TAKE 1 TABLET BY MOUTH TWICE DAILY   spironolactone 50 MG tablet Commonly known as:  ALDACTONE Take 1 tablet (50 mg total) by mouth every morning.   SYMBICORT 160-4.5 MCG/ACT inhaler Generic drug:  budesonide-formoterol INHALE 2 PUFFS BY MOUTH TWICE DAILY.   traMADol 50 MG tablet Commonly known as:  ULTRAM Take 1 tablet (50 mg total) by mouth every 6 (six) hours as needed.   traZODone 100 MG tablet Commonly known as:  DESYREL Take 1 tablet (100 mg total) by mouth at bedtime.   zolpidem 10 MG tablet Commonly known as:  AMBIEN Take 1 tablet (10 mg total) by mouth at bedtime.          Objective:    BP 130/79   Pulse (!) 113   Temp (!) 97.2 F (36.2 C) (Oral)   Ht _0  (1.727 m)   Wt (!) 344 lb (156 kg)   BMI 52.31 kg/m   No Known Allergies  Wt Readings from Last 3 Encounters:  01/18/18 (!) 344 lb (156 kg)  09/17/17 (!) 371 lb (168.3 kg)  06/01/17 (!) 367 lb 6.4 oz (166.7 kg)    Physical Exam   Constitutional: She is oriented to person, place, and time. She appears well-developed and well-nourished.  HENT:  Head: Normocephalic and atraumatic.  Right Ear: Tympanic membrane, external ear and ear canal normal.  Left Ear: Tympanic membrane, external ear and ear canal normal.  Nose: Nose normal. No rhinorrhea.  Mouth/Throat: Oropharynx is clear and moist and mucous  membranes are normal. No oropharyngeal exudate or posterior oropharyngeal erythema.  Eyes: Pupils are equal, round, and reactive to light. Conjunctivae and EOM are normal.  Neck: Normal range of motion. Neck supple.  Cardiovascular: Normal rate, regular rhythm, normal heart sounds and intact distal pulses.  Pulmonary/Chest: Effort normal and breath sounds normal.  Abdominal: Soft. Bowel sounds are normal.  Neurological: She is alert and oriented to person, place, and time. She has normal reflexes.  Skin: Skin is warm and dry. No rash noted.  Psychiatric: She has a normal mood and affect. Her behavior is normal. Judgment and thought content normal.    Results for orders placed or performed in visit on 09/17/17  Hepatic function panel  Result Value Ref Range   Total Protein 6.9 6.1 - 8.1 g/dL   Albumin 3.8 3.6 - 5.1 g/dL   Globulin 3.1 1.9 - 3.7 g/dL (calc)   AG Ratio 1.2 1.0 - 2.5 (calc)   Total Bilirubin 0.2 0.2 - 1.2 mg/dL   Bilirubin, Direct 0.1 0.0 - 0.2 mg/dL   Indirect Bilirubin 0.1 (L) 0.2 - 1.2 mg/dL (calc)   Alkaline phosphatase (APISO) 88 33 - 115 U/L   AST 41 (H) 10 - 35 U/L   ALT 24 6 - 29 U/L  Amylase  Result Value Ref Range   Amylase 99 21 - 101 U/L  Lipase  Result Value Ref Range   Lipase 43 7 - 60 U/L  CBC with Differential/Platelet  Result Value Ref Range   WBC 9.9 3.8 - 10.8 Thousand/uL   RBC 5.20 (H) 3.80 - 5.10 Million/uL   Hemoglobin 14.8 11.7 - 15.5 g/dL   HCT 46.7 (H) 35.0 - 45.0 %   MCV 89.8 80.0 - 100.0 fL   MCH 28.5 27.0 - 33.0 pg   MCHC 31.7 (L) 32.0 - 36.0 g/dL   RDW 12.3 11.0  - 15.0 %   Platelets 248 140 - 400 Thousand/uL   MPV 12.6 (H) 7.5 - 12.5 fL   Neutro Abs 6,930 1,500 - 7,800 cells/uL   Lymphs Abs 2,247 850 - 3,900 cells/uL   WBC mixed population 564 200 - 950 cells/uL   Eosinophils Absolute 109 15 - 500 cells/uL   Basophils Absolute 50 0 - 200 cells/uL   Neutrophils Relative % 70 %   Total Lymphocyte 22.7 %   Monocytes Relative 5.7 %   Eosinophils Relative 1.1 %   Basophils Relative 0.5 %      Assessment & Plan:   1. Type 2 diabetes mellitus with hyperglycemia, without long-term current use of insulin (HCC) - Insulin Glargine (LANTUS SOLOSTAR) 100 UNIT/ML Solostar Pen; Inject 15-50 Units into the skin 1 day or 1 dose for 1 dose.  Dispense: 5 pen; Refill: PRN - lisinopril (PRINIVIL,ZESTRIL) 5 MG tablet; Take 1 tablet (5 mg total) by mouth daily.  Dispense: 90 tablet; Refill: 3  2. Chronic left-sided low back pain with left-sided sciatica - traMADol (ULTRAM) 50 MG tablet; Take 1 tablet (50 mg total) by mouth every 6 (six) hours as needed.  Dispense: 120 tablet; Refill: 2  3. Cyst of left ovary - Ambulatory referral to Obstetrics / Gynecology  4. Pelvic pain - Ambulatory referral to Obstetrics / Gynecology  5. Chronic right-sided low back pain, with sciatica presence unspecified Tramadol 50 mg 1 QID for pain  6. DOE (dyspnea on exertion) - Ambulatory referral to Pulmonology  7. Obesity hypoventilation syndrome ([place]) - Ambulatory referral to Pulmonology   Continue all other maintenance medications  as listed above.  Follow up plan: Return in about 6 weeks (around 03/01/2018).  Educational handout given for [place] PA-C [facility] [address]  [place], [place] [address] [phone]   01/18/2018, 5:11 PM

## 2018-01-24 ENCOUNTER — Telehealth: Payer: Self-pay | Admitting: Physician Assistant

## 2018-01-24 NOTE — Telephone Encounter (Signed)
Pt is wanting a roller walker that you can roll and sit down on, she is wanting it to sent [facility]

## 2018-01-25 NOTE — Telephone Encounter (Signed)
Okay to order large capacity rolling walker

## 2018-02-07 ENCOUNTER — Encounter: Payer: Medicare Other | Admitting: Obstetrics and Gynecology

## 2018-02-19 ENCOUNTER — Other Ambulatory Visit: Payer: Self-pay | Admitting: *Deleted

## 2018-02-19 DIAGNOSIS — E1169 Type 2 diabetes mellitus with other specified complication: Secondary | ICD-10-CM

## 2018-02-19 DIAGNOSIS — E1165 Type 2 diabetes mellitus with hyperglycemia: Secondary | ICD-10-CM

## 2018-02-19 DIAGNOSIS — E785 Hyperlipidemia, unspecified: Principal | ICD-10-CM

## 2018-02-19 MED ORDER — INVOKANA 300 MG PO TABS: 300.0000 mg | ORAL_TABLET | Freq: Every day | ORAL | 0 refills | Status: DC

## 2018-02-19 MED ORDER — ATORVASTATIN CALCIUM 20 MG PO TABS
20.0000 mg | ORAL_TABLET | Freq: Every day | ORAL | 0 refills | Status: DC
Start: 2018-02-19 — End: 2018-11-11

## 2018-02-20 NOTE — Telephone Encounter (Signed)
No call back - this encounter will be closed.  

## 2018-02-21 ENCOUNTER — Other Ambulatory Visit: Payer: Self-pay | Admitting: Physician Assistant

## 2018-02-28 NOTE — Telephone Encounter (Signed)
Error in charting.

## 2018-03-01 ENCOUNTER — Ambulatory Visit: Payer: Medicare Other | Admitting: Physician Assistant

## 2018-03-05 ENCOUNTER — Ambulatory Visit (INDEPENDENT_AMBULATORY_CARE_PROVIDER_SITE_OTHER): Payer: Medicare Other | Admitting: Physician Assistant

## 2018-03-05 ENCOUNTER — Encounter: Payer: Self-pay | Admitting: Physician Assistant

## 2018-03-05 VITALS — BP 134/84 | HR 93 | Temp 99.1°F | Ht 68.0 in | Wt 345.0 lb

## 2018-03-05 DIAGNOSIS — J449 Chronic obstructive pulmonary disease, unspecified: Secondary | ICD-10-CM | POA: Diagnosis not present

## 2018-03-05 DIAGNOSIS — E1165 Type 2 diabetes mellitus with hyperglycemia: Secondary | ICD-10-CM | POA: Diagnosis not present

## 2018-03-05 DIAGNOSIS — F3132 Bipolar disorder, current episode depressed, moderate: Secondary | ICD-10-CM

## 2018-03-05 DIAGNOSIS — S22080K Wedge compression fracture of T11-T12 vertebra, subsequent encounter for fracture with nonunion: Secondary | ICD-10-CM | POA: Diagnosis not present

## 2018-03-05 HISTORY — DX: Wedge compression fracture of t11-T12 vertebra, subsequent encounter for fracture with nonunion: S22.080K

## 2018-03-05 LAB — CMP14+EGFR
A/G RATIO: 1.4 (ref 1.2–2.2)
ALT: 37 IU/L — ABNORMAL HIGH (ref 0–32)
AST: 45 IU/L — AB (ref 0–40)
Albumin: 4.2 g/dL (ref 3.5–5.5)
Alkaline Phosphatase: 93 IU/L (ref 39–117)
BUN/Creatinine Ratio: 12 (ref 9–23)
BUN: 9 mg/dL (ref 6–24)
Bilirubin Total: 0.3 mg/dL (ref 0.0–1.2)
CALCIUM: 9.5 mg/dL (ref 8.7–10.2)
CO2: 25 mmol/L (ref 20–29)
Chloride: 94 mmol/L — ABNORMAL LOW (ref 96–106)
Creatinine, Ser: 0.75 mg/dL (ref 0.57–1.00)
GFR, EST AFRICAN AMERICAN: 111 mL/min/{1.73_m2} (ref >59)
GFR, EST NON AFRICAN AMERICAN: 96 mL/min/{1.73_m2} (ref >59)
GLOBULIN, TOTAL: 3.1 g/dL (ref 1.5–4.5)
Glucose: 289 mg/dL — ABNORMAL HIGH (ref 65–99)
POTASSIUM: 4.7 mmol/L (ref 3.5–5.2)
SODIUM: 136 mmol/L (ref 134–144)
TOTAL PROTEIN: 7.3 g/dL (ref 6.0–8.5)

## 2018-03-05 LAB — BAYER DCA HB A1C WAIVED: HB A1C (BAYER DCA - WAIVED): >14 % — ABNORMAL HIGH (ref <7.0)

## 2018-03-05 MED ORDER — INSULIN GLARGINE 100 UNIT/ML SOLOSTAR PEN: 50.0000 [IU] | PEN_INJECTOR | SUBCUTANEOUS | 99 refills | Status: DC

## 2018-03-05 MED ORDER — FLUOXETINE HCL 20 MG PO CAPS: ORAL_CAPSULE | ORAL | 3 refills | Status: DC

## 2018-03-05 MED ORDER — FLUCONAZOLE 150 MG PO TABS: 150.0000 mg | ORAL_TABLET | ORAL | 11 refills | Status: DC

## 2018-03-05 NOTE — Patient Instructions (Signed)
In a few days you may receive a survey in the mail or online from [employer] regarding your visit with us today. Please take a moment to fill this out. Your feedback is very important to our whole office. It can help us better understand your needs as well as improve your experience and satisfaction. Thank you for taking your time to complete it. We care about you.  Arek Spadafore, PA-C  

## 2018-03-05 NOTE — Progress Notes (Signed)
BP 134/84   Pulse 93   Temp 99.1 F (37.3 C) (Oral)   Ht '5\' 8"'$  (1.727 m)   Wt (!) 345 lb (156.5 kg)   BMI 52.46 kg/m    Subjective:    Patient ID: Aimee Phillips, female    DOB: 08/22/1971, 47 y.o.   MRN: [id number]  HPI: Aimee Phillips is a 47 y.o. female presenting on 03/05/2018 for Diabetes (6 week rck )  This patient comes in for periodic recheck on medications and conditions including type 2 diabetes uncontrolled, bipolar disorder, compression fracture with nonunion and chronic pain, COPD.  Reviewed all of her medications.  She does need some refills on things.  She needs to have pain center in the area we will put a referral in with Dr. Merlene Laughter.  She does have a follow-up appointment with Dr. Luan Pulling for pulmonology.  She had been using Lantus and had gotten up to 35 units daily I am going to have her increase to 50 mg daily and increase by 1 unit daily until the fasting blood sugar is close to 100 we will have a short follow-up..   All medications are reviewed today. There are no reports of any problems with the medications. All of the medical conditions are reviewed and updated.  Lab work is reviewed and will be ordered as medically necessary. There are no new problems reported with today's visit.   Past Medical History:  Diagnosis Date  . Asthma   . Bipolar affect, depressed ([place])   . Cellulitis   . COPD (chronic obstructive pulmonary disease) ([place])   . Depression   . PE (pulmonary embolism)   . Pulmonary edema    Relevant past medical, surgical, family and social history reviewed and updated as indicated. Interim medical history since our last visit reviewed. Allergies and medications reviewed and updated. DATA REVIEWED: CHART IN EPIC  Family History reviewed for pertinent findings.  Review of Systems  Constitutional: Positive for fatigue. Negative for activity change and fever.  HENT: Negative.   Eyes: Negative.   Respiratory: Positive for shortness of breath  and wheezing. Negative for cough.   Cardiovascular: Negative.  Negative for chest pain.  Gastrointestinal: Negative.  Negative for abdominal pain.  Endocrine: Negative.   Genitourinary: Positive for vaginal discharge. Negative for dysuria.  Musculoskeletal: Positive for arthralgias, gait problem, joint swelling and myalgias.  Skin: Negative.   Hematological: Negative.     Allergies as of 03/05/2018   No Known Allergies     Medication List        Accurate as of 03/05/18 11:16 AM. Always use your most recent med list.          albuterol 108 (90 Base) MCG/ACT inhaler Commonly known as:  PROVENTIL HFA;VENTOLIN HFA Inhale 2 puffs into the lungs every 6 (six) hours as needed for wheezing or shortness of breath.   atorvastatin 20 MG tablet Commonly known as:  LIPITOR Take 1 tablet (20 mg total) by mouth daily at 6 PM.   blood glucose meter kit and supplies Check BS up to four times a day   Blood Glucose Monitoring Suppl Devi Check bs up to four times a day   BYETTA 5 MCG PEN 5 MCG/0.02ML Sopn injection Generic drug:  exenatide INJECT 5MCG SUBCUTANEOUSLY TWICE DAILY   cyclobenzaprine 10 MG tablet Commonly known as:  FLEXERIL Take 1 tablet (10 mg total) by mouth 3 (three) times daily as needed for muscle spasms.   diclofenac 50 MG  tablet Commonly known as:  CATAFLAM Take 1 tablet (50 mg total) by mouth 2 (two) times daily.   fluconazole 150 MG tablet Commonly known as:  DIFLUCAN Take 1 tablet (150 mg total) by mouth once a week.   FLUoxetine 20 MG capsule Commonly known as:  PROZAC TAKE THREE (3) CAPSULES BY MOUTH ONCE DAILY. (MORNING)   furosemide 40 MG tablet Commonly known as:  LASIX TAKE 1 TABLET BY MOUTH TWICE DAILY   glucose blood test strip Commonly known as:  ACCU-CHEK AVIVA Use to check blood sugar four times daily   Insulin Glargine 100 UNIT/ML Solostar Pen Commonly known as:  LANTUS SOLOSTAR Inject 50-100 Units into the skin 1 day or 1 dose for 1  dose.   INVOKANA 300 MG Tabs tablet Generic drug:  canagliflozin Take 1 tablet (300 mg total) by mouth daily before breakfast.   ipratropium-albuterol 0.5-2.5 (3) MG/3ML Soln Commonly known as:  DUONEB Take 3 mLs by nebulization every 6 (six) hours as needed.   Lancet Devices Misc Test QID, DX E11.65, Aviva Plus   lisinopril 5 MG tablet Commonly known as:  PRINIVIL,ZESTRIL Take 1 tablet (5 mg total) by mouth daily.   mirtazapine 30 MG tablet Commonly known as:  REMERON TAKE ONE TABLET BY MOUTH AT BEDTIME   OLANZapine 10 MG tablet Commonly known as:  ZYPREXA Take 1 tablet (10 mg total) by mouth at bedtime.   omeprazole 20 MG capsule Commonly known as:  PRILOSEC TAKE ONE CAPSULE BY MOUTH ONCE DAILY. (MORNING)   oxybutynin 15 MG 24 hr tablet Commonly known as:  DITROPAN XL Take 1 tablet (15 mg total) by mouth 2 (two) times daily.   potassium chloride 10 MEQ tablet Commonly known as:  K-DUR TAKE 1 TABLET BY MOUTH TWICE DAILY   SPIRIVA HANDIHALER 18 MCG inhalation capsule Generic drug:  tiotropium INHALE ONE PUFF BY MOUTH DAILY.   spironolactone 50 MG tablet Commonly known as:  ALDACTONE Take 1 tablet (50 mg total) by mouth every morning.   SYMBICORT 160-4.5 MCG/ACT inhaler Generic drug:  budesonide-formoterol INHALE 2 PUFFS BY MOUTH TWICE DAILY.   traMADol 50 MG tablet Commonly known as:  ULTRAM Take 1 tablet (50 mg total) by mouth every 6 (six) hours as needed.   traZODone 100 MG tablet Commonly known as:  DESYREL Take 1 tablet (100 mg total) by mouth at bedtime.   zolpidem 10 MG tablet Commonly known as:  AMBIEN Take 1 tablet (10 mg total) by mouth at bedtime.          Objective:    BP 134/84   Pulse 93   Temp 99.1 F (37.3 C) (Oral)   Ht '5\' 8"'$  (1.727 m)   Wt (!) 345 lb (156.5 kg)   BMI 52.46 kg/m   No Known Allergies  Wt Readings from Last 3 Encounters:  03/05/18 (!) 345 lb (156.5 kg)  01/18/18 (!) 344 lb (156 kg)  09/17/17 (!) 371 lb  (168.3 kg)    Physical Exam  Constitutional: She is oriented to person, place, and time. She appears well-developed and well-nourished.  HENT:  Head: Normocephalic and atraumatic.  Eyes: Pupils are equal, round, and reactive to light. Conjunctivae and EOM are normal.  Cardiovascular: Normal rate, regular rhythm, normal heart sounds and intact distal pulses.  Pulmonary/Chest: Effort normal and breath sounds normal.  Abdominal: Soft. Bowel sounds are normal.  Neurological: She is alert and oriented to person, place, and time. She has normal reflexes.  Skin: Skin is  warm and dry. No rash noted.  Psychiatric: She has a normal mood and affect. Her behavior is normal. Judgment and thought content normal.    Results for orders placed or performed in visit on 09/17/17  Hepatic function panel  Result Value Ref Range   Total Protein 6.9 6.1 - 8.1 g/dL   Albumin 3.8 3.6 - 5.1 g/dL   Globulin 3.1 1.9 - 3.7 g/dL (calc)   AG Ratio 1.2 1.0 - 2.5 (calc)   Total Bilirubin 0.2 0.2 - 1.2 mg/dL   Bilirubin, Direct 0.1 0.0 - 0.2 mg/dL   Indirect Bilirubin 0.1 (L) 0.2 - 1.2 mg/dL (calc)   Alkaline phosphatase (APISO) 88 33 - 115 U/L   AST 41 (H) 10 - 35 U/L   ALT 24 6 - 29 U/L  Amylase  Result Value Ref Range   Amylase 99 21 - 101 U/L  Lipase  Result Value Ref Range   Lipase 43 7 - 60 U/L  CBC with Differential/Platelet  Result Value Ref Range   WBC 9.9 3.8 - 10.8 Thousand/uL   RBC 5.20 (H) 3.80 - 5.10 Million/uL   Hemoglobin 14.8 11.7 - 15.5 g/dL   HCT 46.7 (H) 35.0 - 45.0 %   MCV 89.8 80.0 - 100.0 fL   MCH 28.5 27.0 - 33.0 pg   MCHC 31.7 (L) 32.0 - 36.0 g/dL   RDW 12.3 11.0 - 15.0 %   Platelets 248 140 - 400 Thousand/uL   MPV 12.6 (H) 7.5 - 12.5 fL   Neutro Abs 6,930 1,500 - 7,800 cells/uL   Lymphs Abs 2,247 850 - 3,900 cells/uL   WBC mixed population 564 200 - 950 cells/uL   Eosinophils Absolute 109 15 - 500 cells/uL   Basophils Absolute 50 0 - 200 cells/uL   Neutrophils Relative % 70  %   Total Lymphocyte 22.7 %   Monocytes Relative 5.7 %   Eosinophils Relative 1.1 %   Basophils Relative 0.5 %      Assessment & Plan:   1. Type 2 diabetes mellitus with hyperglycemia, without long-term current use of insulin (HCC) - Insulin Glargine (LANTUS SOLOSTAR) 100 UNIT/ML Solostar Pen; Inject 50-100 Units into the skin 1 day or 1 dose for 1 dose.  Dispense: 10 pen; Refill: PRN - CMP14+EGFR - Bayer DCA Hb A1c Waived  2. Bipolar affective disorder, currently depressed, moderate (HCC) - FLUoxetine (PROZAC) 20 MG capsule; TAKE THREE (3) CAPSULES BY MOUTH ONCE DAILY. (MORNING)  Dispense: 90 capsule; Refill: 3  3. 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 Chronic obstructive pulmonary disease, unspecified COPD type ([place]) - SPIRIVA HANDIHALER 18 MCG inhalation capsule; INHALE ONE PUFF BY MOUTH DAILY.; Refill: 12   Continue all other maintenance medications as listed above.  Follow up plan: Return in about 3 months (around 06/05/2018) for recheck.  Educational handout given for [place] PA-C [facility] [address]  [place], [place] [address] [phone]   03/05/2018, 11:16 AM

## 2018-03-07 ENCOUNTER — Encounter: Payer: Self-pay | Admitting: Physician Assistant

## 2018-03-11 ENCOUNTER — Ambulatory Visit (INDEPENDENT_AMBULATORY_CARE_PROVIDER_SITE_OTHER): Payer: Medicare Other | Admitting: Physician Assistant

## 2018-03-11 ENCOUNTER — Encounter: Payer: Self-pay | Admitting: Physician Assistant

## 2018-03-11 VITALS — BP 119/68 | HR 92 | Temp 97.4°F | Ht 68.0 in | Wt 360.4 lb

## 2018-03-11 DIAGNOSIS — H539 Unspecified visual disturbance: Secondary | ICD-10-CM | POA: Diagnosis not present

## 2018-03-11 DIAGNOSIS — E109 Type 1 diabetes mellitus without complications: Secondary | ICD-10-CM

## 2018-03-11 MED ORDER — INSULIN LISPRO 200 UNIT/ML ~~LOC~~ SOPN: 10.0000 [IU] | PEN_INJECTOR | Freq: Two times a day (BID) | SUBCUTANEOUS | 5 refills | Status: DC

## 2018-03-11 MED ORDER — INSULIN PEN NEEDLE 31G X 8 MM MISC: 1.0000 [IU] | Freq: Four times a day (QID) | 2 refills | Status: DC

## 2018-03-11 NOTE — Patient Instructions (Signed)
My Fitness Pal   Carbohydrate Counting for Diabetes Mellitus, Adult Carbohydrate counting is a method for keeping track of how many carbohydrates you eat. Eating carbohydrates naturally increases the amount of sugar (glucose) in the blood. Counting how many carbohydrates you eat helps keep your blood glucose within normal limits, which helps you manage your diabetes (diabetes mellitus). It is important to know how many carbohydrates you can safely have in each meal. This is different for every person. A diet and nutrition specialist (registered dietitian) can help you make a meal plan and calculate how many carbohydrates you should have at each meal and snack. Carbohydrates are found in the following foods:  Grains, such as breads and cereals.  Dried beans and soy products.  Starchy vegetables, such as potatoes, peas, and corn.  Fruit and fruit juices.  Milk and yogurt.  Sweets and snack foods, such as cake, cookies, candy, chips, and soft drinks.  How do I count carbohydrates? There are two ways to count carbohydrates in food. You can use either of the methods or a combination of both. Reading "Nutrition Facts" on packaged food The "Nutrition Facts" list is included on the labels of almost all packaged foods and beverages in the [place] It includes:  The serving size.  Information about nutrients in each serving, including the grams (g) of carbohydrate per serving.  To use the "Nutrition Facts":  Decide how many servings you will have.  Multiply the number of servings by the number of carbohydrates per serving.  The resulting number is the total amount of carbohydrates that you will be having.  Learning standard serving sizes of other foods When you eat foods containing carbohydrates that are not packaged or do not include "Nutrition Facts" on the label, you need to measure the servings in order to count the amount of carbohydrates:  Measure the foods that you will eat with a  food scale or measuring cup, if needed.  Decide how many standard-size servings you will eat.  Multiply the number of servings by 15. Most carbohydrate-rich foods have about 15 g of carbohydrates per serving. ? For example, if you eat 8 oz (170 g) of strawberries, you will have eaten 2 servings and 30 g of carbohydrates (2 servings x 15 g = 30 g).  For foods that have more than one food mixed, such as soups and casseroles, you must count the carbohydrates in each food that is included.  The following list contains standard serving sizes of common carbohydrate-rich foods. Each of these servings has about 15 g of carbohydrates:   hamburger bun or  English muffin.   oz (15 mL) syrup.   oz (14 g) jelly.  1 slice of bread.  1 six-inch tortilla.  3 oz (85 g) cooked rice or pasta.  4 oz (113 g) cooked dried beans.  4 oz (113 g) starchy vegetable, such as peas, corn, or potatoes.  4 oz (113 g) hot cereal.  4 oz (113 g) mashed potatoes or  of a large baked potato.  4 oz (113 g) canned or frozen fruit.  4 oz (120 mL) fruit juice.  4-6 crackers.  6 chicken nuggets.  6 oz (170 g) unsweetened dry cereal.  6 oz (170 g) plain fat-free yogurt or yogurt sweetened with artificial sweeteners.  8 oz (240 mL) milk.  8 oz (170 g) fresh fruit or one small piece of fruit.  24 oz (680 g) popped popcorn.  Example of carbohydrate counting Sample meal  3  oz (85 g) chicken breast.  6 oz (170 g) brown rice.  4 oz (113 g) corn.  8 oz (240 mL) milk.  8 oz (170 g) strawberries with sugar-free whipped topping. Carbohydrate calculation 1. Identify the foods that contain carbohydrates: ? Rice. ? Corn. ? Milk. ? Strawberries. 2. Calculate how many servings you have of each food: ? 2 servings rice. ? 1 serving corn. ? 1 serving milk. ? 1 serving strawberries. 3. Multiply each number of servings by 15 g: ? 2 servings rice x 15 g = 30 g. ? 1 serving corn x 15 g = 15 g. ? 1  serving milk x 15 g = 15 g. ? 1 serving strawberries x 15 g = 15 g. 4. Add together all of the amounts to find the total grams of carbohydrates eaten: ? 30 g + 15 g + 15 g + 15 g = 75 g of carbohydrates total. This information is not intended to replace advice given to you by your health care provider. Make sure you discuss any questions you have with your health care provider. Document Released: 08/28/2005 Document Revised: 03/17/2016 Document Reviewed: 02/09/2016 Elsevier Interactive Patient Education  [employer].

## 2018-03-12 DIAGNOSIS — E109 Type 1 diabetes mellitus without complications: Principal | ICD-10-CM

## 2018-03-12 DIAGNOSIS — E1165 Type 2 diabetes mellitus with hyperglycemia: Secondary | ICD-10-CM | POA: Insufficient documentation

## 2018-03-12 NOTE — Progress Notes (Signed)
BP 119/68   Pulse 92   Temp (!) 97.4 F (36.3 C) (Oral)   Ht '5\' 8"'$  (1.727 m)   Wt (!) 360 lb 6.4 oz (163.5 kg)   BMI 54.80 kg/m    Subjective:    Patient ID: Aimee Phillips, female    DOB: [birth year], 47 y.o.   MRN: [id number]  HPI: Aimee Phillips is a 47 y.o. female presenting on 03/11/2018 for Diabetes (Discuss A1C of greater than 14.0)  This patient comes in to discuss her most recent A1c. 14.  She had not been taking her insulin or other medications for some months.  We have reviewed all the medications that she is taking now.  We have also discussed that she is going to need to start in addition to her Lantus some mealtime insulin.  We have instructed her fully on this.  We have also gone through dietary changes and sources that can help her with carb counting.  Of encouraged her to keep her carbs at 45 to 50 g per meal at the most.  And also her snack should be 15 g or less.  We can have her come back in 2 months.  Past Medical History:  Diagnosis Date  . Asthma   . Bipolar affect, depressed ([place])   . Cellulitis   . COPD (chronic obstructive pulmonary disease) ([place])   . Depression   . PE (pulmonary embolism)   . Pulmonary edema    Relevant past medical, surgical, family and social history reviewed and updated as indicated. Interim medical history since our last visit reviewed. Allergies and medications reviewed and updated. DATA REVIEWED: CHART IN EPIC  Family History reviewed for pertinent findings.  Review of Systems  Constitutional: Positive for fatigue. Negative for activity change and fever.  HENT: Negative.   Eyes: Positive for visual disturbance.  Respiratory: Negative.  Negative for cough.   Cardiovascular: Negative.  Negative for chest pain.  Gastrointestinal: Negative.  Negative for abdominal pain.  Endocrine: Negative.   Genitourinary: Negative.  Negative for dysuria.  Musculoskeletal: Positive for arthralgias and back pain.  Skin: Negative.     Neurological: Negative.     Allergies as of 03/11/2018   No Known Allergies     Medication List        Accurate as of 03/11/18 11:59 PM. Always use your most recent med list.          albuterol 108 (90 Base) MCG/ACT inhaler Commonly known as:  PROVENTIL HFA;VENTOLIN HFA Inhale 2 puffs into the lungs every 6 (six) hours as needed for wheezing or shortness of breath.   atorvastatin 20 MG tablet Commonly known as:  LIPITOR Take 1 tablet (20 mg total) by mouth daily at 6 PM.   blood glucose meter kit and supplies Check BS up to four times a day   Blood Glucose Monitoring Suppl Devi Check bs up to four times a day   BYETTA 5 MCG PEN 5 MCG/0.02ML Sopn injection Generic drug:  exenatide INJECT 5MCG SUBCUTANEOUSLY TWICE DAILY   cyclobenzaprine 10 MG tablet Commonly known as:  FLEXERIL Take 1 tablet (10 mg total) by mouth 3 (three) times daily as needed for muscle spasms.   diclofenac 50 MG tablet Commonly known as:  CATAFLAM Take 1 tablet (50 mg total) by mouth 2 (two) times daily.   fluconazole 150 MG tablet Commonly known as:  DIFLUCAN Take 1 tablet (150 mg total) by mouth once a week.   FLUoxetine  20 MG capsule Commonly known as:  PROZAC TAKE THREE (3) CAPSULES BY MOUTH ONCE DAILY. (MORNING)   furosemide 40 MG tablet Commonly known as:  LASIX TAKE 1 TABLET BY MOUTH TWICE DAILY   glucose blood test strip Commonly known as:  ACCU-CHEK AVIVA Use to check blood sugar four times daily   Insulin Glargine 100 UNIT/ML Solostar Pen Commonly known as:  LANTUS SOLOSTAR Inject 50-100 Units into the skin 1 day or 1 dose for 1 dose.   Insulin Lispro 200 UNIT/ML Sopn Commonly known as:  HUMALOG KWIKPEN Inject 10-30 Units into the skin 2 (two) times daily before a meal.   Insulin Pen Needle 31G X 8 MM Misc 1 Units by Does not apply route 4 (four) times daily.   INVOKANA 300 MG Tabs tablet Generic drug:  canagliflozin Take 1 tablet (300 mg total) by mouth daily before  breakfast.   ipratropium-albuterol 0.5-2.5 (3) MG/3ML Soln Commonly known as:  DUONEB Take 3 mLs by nebulization every 6 (six) hours as needed.   Lancet Devices Misc Test QID, DX E11.65, Aviva Plus   lisinopril 5 MG tablet Commonly known as:  PRINIVIL,ZESTRIL Take 1 tablet (5 mg total) by mouth daily.   mirtazapine 30 MG tablet Commonly known as:  REMERON TAKE ONE TABLET BY MOUTH AT BEDTIME   OLANZapine 10 MG tablet Commonly known as:  ZYPREXA Take 1 tablet (10 mg total) by mouth at bedtime.   omeprazole 20 MG capsule Commonly known as:  PRILOSEC TAKE ONE CAPSULE BY MOUTH ONCE DAILY. (MORNING)   oxybutynin 15 MG 24 hr tablet Commonly known as:  DITROPAN XL Take 1 tablet (15 mg total) by mouth 2 (two) times daily.   potassium chloride 10 MEQ tablet Commonly known as:  K-DUR TAKE 1 TABLET BY MOUTH TWICE DAILY   SPIRIVA HANDIHALER 18 MCG inhalation capsule Generic drug:  tiotropium INHALE ONE PUFF BY MOUTH DAILY.   spironolactone 50 MG tablet Commonly known as:  ALDACTONE Take 1 tablet (50 mg total) by mouth every morning.   SYMBICORT 160-4.5 MCG/ACT inhaler Generic drug:  budesonide-formoterol INHALE 2 PUFFS BY MOUTH TWICE DAILY.   traMADol 50 MG tablet Commonly known as:  ULTRAM Take 1 tablet (50 mg total) by mouth every 6 (six) hours as needed.   traZODone 100 MG tablet Commonly known as:  DESYREL Take 1 tablet (100 mg total) by mouth at bedtime.   zolpidem 10 MG tablet Commonly known as:  AMBIEN Take 1 tablet (10 mg total) by mouth at bedtime.          Objective:    BP 119/68   Pulse 92   Temp (!) 97.4 F (36.3 C) (Oral)   Ht '5\' 8"'$  (1.727 m)   Wt (!) 360 lb 6.4 oz (163.5 kg)   BMI 54.80 kg/m   No Known Allergies  Wt Readings from Last 3 Encounters:  03/11/18 (!) 360 lb 6.4 oz (163.5 kg)  03/05/18 (!) 345 lb (156.5 kg)  01/18/18 (!) 344 lb (156 kg)    Physical Exam  Constitutional: She is oriented to person, place, and time. She appears  well-developed and well-nourished.  HENT:  Head: Normocephalic and atraumatic.  Eyes: Pupils are equal, round, and reactive to light. Conjunctivae and EOM are normal.  Cardiovascular: Normal rate, regular rhythm, normal heart sounds and intact distal pulses.  Pulmonary/Chest: Effort normal and breath sounds normal.  Abdominal: Soft. Bowel sounds are normal.  Neurological: She is alert and oriented to person, place, and  time. She has normal reflexes.  Skin: Skin is warm and dry. No rash noted.  Psychiatric: She has a normal mood and affect. Her behavior is normal. Judgment and thought content normal.    Results for orders placed or performed in visit on 03/05/18  CMP14+EGFR  Result Value Ref Range   Glucose 289 (H) 65 - 99 mg/dL   BUN 9 6 - 24 mg/dL   Creatinine, Ser 0.75 0.57 - 1.00 mg/dL   GFR calc non Af Amer 96 >59 mL/min/1.73   GFR calc Af Amer 111 >59 mL/min/1.73   BUN/Creatinine Ratio 12 9 - 23   Sodium 136 134 - 144 mmol/L   Potassium 4.7 3.5 - 5.2 mmol/L   Chloride 94 (L) 96 - 106 mmol/L   CO2 25 20 - 29 mmol/L   Calcium 9.5 8.7 - 10.2 mg/dL   Total Protein 7.3 6.0 - 8.5 g/dL   Albumin 4.2 3.5 - 5.5 g/dL   Globulin, Total 3.1 1.5 - 4.5 g/dL   Albumin/Globulin Ratio 1.4 1.2 - 2.2   Bilirubin Total 0.3 0.0 - 1.2 mg/dL   Alkaline Phosphatase 93 39 - 117 IU/L   AST 45 (H) 0 - 40 IU/L   ALT 37 (H) 0 - 32 IU/L  Bayer DCA Hb A1c Waived  Result Value Ref Range   HB A1C (BAYER DCA - WAIVED) >14.0 (H) <7.0 %      Assessment & Plan:   1. Type 1 diabetes mellitus without complications (HCC) - Insulin Lispro (HUMALOG KWIKPEN) 200 UNIT/ML SOPN; Inject 10-30 Units into the skin 2 (two) times daily before a meal.  Dispense: 3 mL; Refill: 5 - Insulin Pen Needle 31G X 8 MM MISC; 1 Units by Does not apply route 4 (four) times daily.  Dispense: 100 each; Refill: 2  2. Vision changes Get glucose reduced for improvement   Continue all other maintenance medications as listed  above.  Follow up plan: Return in about 2 months (around 05/12/2018).  Educational handout given for carb counting [employer] App  Terald Sleeper PA-C [facility] [address]  [place], [place] [address] [phone]   03/12/2018, 1:42 PM

## 2018-04-16 ENCOUNTER — Other Ambulatory Visit: Payer: Self-pay | Admitting: Physician Assistant

## 2018-04-16 DIAGNOSIS — E1165 Type 2 diabetes mellitus with hyperglycemia: Secondary | ICD-10-CM

## 2018-04-17 ENCOUNTER — Other Ambulatory Visit: Payer: Medicare Other

## 2018-05-15 ENCOUNTER — Ambulatory Visit (INDEPENDENT_AMBULATORY_CARE_PROVIDER_SITE_OTHER): Payer: Medicare Other | Admitting: Physician Assistant

## 2018-05-15 ENCOUNTER — Encounter: Payer: Self-pay | Admitting: Physician Assistant

## 2018-05-15 VITALS — BP 141/87 | HR 95 | Temp 98.7°F | Ht 68.0 in | Wt 366.0 lb

## 2018-05-15 DIAGNOSIS — R1030 Lower abdominal pain, unspecified: Secondary | ICD-10-CM | POA: Diagnosis not present

## 2018-05-15 DIAGNOSIS — G8929 Other chronic pain: Secondary | ICD-10-CM

## 2018-05-15 DIAGNOSIS — R0681 Apnea, not elsewhere classified: Secondary | ICD-10-CM

## 2018-05-15 DIAGNOSIS — M5442 Lumbago with sciatica, left side: Secondary | ICD-10-CM

## 2018-05-15 DIAGNOSIS — E109 Type 1 diabetes mellitus without complications: Secondary | ICD-10-CM

## 2018-05-15 DIAGNOSIS — N814 Uterovaginal prolapse, unspecified: Secondary | ICD-10-CM | POA: Insufficient documentation

## 2018-05-15 DIAGNOSIS — F3132 Bipolar disorder, current episode depressed, moderate: Secondary | ICD-10-CM | POA: Diagnosis not present

## 2018-05-15 DIAGNOSIS — R0683 Snoring: Secondary | ICD-10-CM

## 2018-05-15 LAB — BAYER DCA HB A1C WAIVED: HB A1C (BAYER DCA - WAIVED): 10.8 % — ABNORMAL HIGH (ref <7.0)

## 2018-05-15 MED ORDER — MIRTAZAPINE 45 MG PO TABS: 45.0000 mg | ORAL_TABLET | Freq: Every day | ORAL | 11 refills | Status: DC

## 2018-05-15 MED ORDER — TRAMADOL HCL 50 MG PO TABS: 50.0000 mg | ORAL_TABLET | Freq: Four times a day (QID) | ORAL | 2 refills | Status: DC | PRN

## 2018-05-20 NOTE — Progress Notes (Signed)
BP (!) 141/87   Pulse 95   Temp 98.7 F (37.1 C) (Oral)   Ht '5\' 8"'$  (1.727 m)   Wt (!) 366 lb (166 kg)   BMI 55.65 kg/m    Subjective:    Patient ID: Aimee Phillips, female    DOB: [birth year], 47 y.o.   MRN: [id number]  HPI: Aimee Phillips is a 47 y.o. female presenting on 05/15/2018 for Diabetes (2 month follow up )  This patient comes in for all of her chronic medical conditions.  She is currently dealing with her diabetes.  She states she has been having some improved readings.  We have made a lot of adjustments over the past couple months.  She continues with mood disorder.  She also still doing levels with her morbid obesity and difficulty with moving.  She has dropped a few more pounds.  She complains mostly today of her cystocele on bothering her and having pain in her pelvic area.  We will make a referral to gynecology to review this.    She does have long-term low back pain with sciatica.  She also has snoring and apnea.  She reports that she has never had a sleep test performed.  We will make a referral for this.  There has been an attempt several years ago to do this.  Past Medical History:  Diagnosis Date  . Asthma   . Bipolar affect, depressed ([place])   . Cellulitis   . COPD (chronic obstructive pulmonary disease) ([place])   . Depression   . PE (pulmonary embolism)   . Pulmonary edema    Relevant past medical, surgical, family and social history reviewed and updated as indicated. Interim medical history since our last visit reviewed. Allergies and medications reviewed and updated. DATA REVIEWED: CHART IN EPIC  Family History reviewed for pertinent findings.  Review of Systems  Constitutional: Positive for fatigue. Negative for activity change and fever.  HENT: Negative.   Eyes: Negative.   Respiratory: Positive for apnea. Negative for cough.   Cardiovascular: Negative.  Negative for chest pain.  Gastrointestinal: Negative.  Negative for abdominal pain.    Endocrine: Negative.   Genitourinary: Positive for enuresis, pelvic pain and vaginal pain. Negative for dysuria.  Musculoskeletal: Positive for arthralgias, back pain and myalgias.  Skin: Negative.   Neurological: Negative.   Psychiatric/Behavioral: Positive for dysphoric mood. The patient is nervous/anxious.     Allergies as of 05/15/2018   No Known Allergies     Medication List        Accurate as of 05/15/18 11:59 PM. Always use your most recent med list.          albuterol 108 (90 Base) MCG/ACT inhaler Commonly known as:  PROVENTIL HFA;VENTOLIN HFA Inhale 2 puffs into the lungs every 6 (six) hours as needed for wheezing or shortness of breath.   atorvastatin 20 MG tablet Commonly known as:  LIPITOR Take 1 tablet (20 mg total) by mouth daily at 6 PM.   blood glucose meter kit and supplies Check BS up to four times a day   Blood Glucose Monitoring Suppl Devi Check bs up to four times a day   BYETTA 5 MCG PEN 5 MCG/0.02ML Sopn injection Generic drug:  exenatide INJECT 5MCG SUBCUTANEOUSLY TWICE DAILY   cyclobenzaprine 10 MG tablet Commonly known as:  FLEXERIL Take 1 tablet (10 mg total) by mouth 3 (three) times daily as needed for muscle spasms.   diclofenac 50 MG tablet  Commonly known as:  CATAFLAM Take 1 tablet (50 mg total) by mouth 2 (two) times daily.   fluconazole 150 MG tablet Commonly known as:  DIFLUCAN Take 1 tablet (150 mg total) by mouth once a week.   FLUoxetine 20 MG capsule Commonly known as:  PROZAC TAKE THREE (3) CAPSULES BY MOUTH ONCE DAILY. (MORNING)   furosemide 40 MG tablet Commonly known as:  LASIX TAKE 1 TABLET BY MOUTH TWICE DAILY   glucose blood test strip Use to check blood sugar four times daily   Insulin Glargine 100 UNIT/ML Solostar Pen Commonly known as:  LANTUS Inject 50-100 Units into the skin 1 day or 1 dose for 1 dose.   Insulin Lispro 200 UNIT/ML Sopn Inject 10-30 Units into the skin 2 (two) times daily before a meal.    Insulin Pen Needle 31G X 8 MM Misc 1 Units by Does not apply route 4 (four) times daily.   ipratropium-albuterol 0.5-2.5 (3) MG/3ML Soln Commonly known as:  DUONEB Take 3 mLs by nebulization every 6 (six) hours as needed.   Lancet Devices Misc Test QID, DX E11.65, Aviva Plus   lisinopril 5 MG tablet Commonly known as:  PRINIVIL,ZESTRIL Take 1 tablet (5 mg total) by mouth daily.   mirtazapine 45 MG tablet Commonly known as:  REMERON Take 1 tablet (45 mg total) by mouth at bedtime.   OLANZapine 10 MG tablet Commonly known as:  ZYPREXA Take 1 tablet (10 mg total) by mouth at bedtime.   omeprazole 20 MG capsule Commonly known as:  PRILOSEC TAKE ONE CAPSULE BY MOUTH ONCE DAILY. (MORNING)   oxybutynin 15 MG 24 hr tablet Commonly known as:  DITROPAN XL Take 1 tablet (15 mg total) by mouth 2 (two) times daily.   potassium chloride 10 MEQ tablet Commonly known as:  K-DUR TAKE 1 TABLET BY MOUTH TWICE DAILY   SPIRIVA HANDIHALER 18 MCG inhalation capsule Generic drug:  tiotropium INHALE ONE PUFF BY MOUTH DAILY.   spironolactone 50 MG tablet Commonly known as:  ALDACTONE Take 1 tablet (50 mg total) by mouth every morning.   SYMBICORT 160-4.5 MCG/ACT inhaler Generic drug:  budesonide-formoterol INHALE 2 PUFFS BY MOUTH TWICE DAILY.   traMADol 50 MG tablet Commonly known as:  ULTRAM Take 1 tablet (50 mg total) by mouth every 6 (six) hours as needed.   traZODone 100 MG tablet Commonly known as:  DESYREL Take 1 tablet (100 mg total) by mouth at bedtime.   zolpidem 10 MG tablet Commonly known as:  AMBIEN Take 1 tablet (10 mg total) by mouth at bedtime.          Objective:    BP (!) 141/87   Pulse 95   Temp 98.7 F (37.1 C) (Oral)   Ht '5\' 8"'$  (1.727 m)   Wt (!) 366 lb (166 kg)   BMI 55.65 kg/m   No Known Allergies  Wt Readings from Last 3 Encounters:  05/15/18 (!) 366 lb (166 kg)  03/11/18 (!) 360 lb 6.4 oz (163.5 kg)  03/05/18 (!) 345 lb (156.5 kg)     Physical Exam  Constitutional: She is oriented to person, place, and time. She appears well-developed and well-nourished.  HENT:  Head: Normocephalic and atraumatic.  Right Ear: Tympanic membrane, external ear and ear canal normal.  Left Ear: Tympanic membrane, external ear and ear canal normal.  Nose: Nose normal. No rhinorrhea.  Mouth/Throat: Oropharynx is clear and moist and mucous membranes are normal. No oropharyngeal exudate or posterior oropharyngeal  erythema.  Eyes: Pupils are equal, round, and reactive to light. Conjunctivae and EOM are normal.  Neck: Normal range of motion. Neck supple.  Cardiovascular: Normal rate, regular rhythm, normal heart sounds and intact distal pulses.  Pulmonary/Chest: Effort normal and breath sounds normal.  Abdominal: Soft. Bowel sounds are normal.  Neurological: She is alert and oriented to person, place, and time. She has normal reflexes.  Skin: Skin is warm and dry. No rash noted.  Psychiatric: She has a normal mood and affect. Her behavior is normal. Judgment and thought content normal.    Results for orders placed or performed in visit on 05/15/18  Bayer DCA Hb A1c Waived  Result Value Ref Range   HB A1C (BAYER DCA - WAIVED) 10.8 (H) <7.0 %      Assessment & Plan:   1. Type 1 diabetes mellitus without complications (HCC) - Bayer DCA Hb A1c Waived A1c was 14.0 3 months ago, down to 10.8  2. Bipolar affective disorder, currently depressed, moderate (HCC) - mirtazapine (REMERON) 45 MG tablet; Take 1 tablet (45 mg total) by mouth at bedtime.  Dispense: 30 tablet; Refill: 11  3. Morbid obesity ([place]) Continue diet changes  4. Lower abdominal pain GYN referral  5. Cystocele with prolapse GYN referral  6. Chronic left-sided low back pain with left-sided sciatica - traMADol (ULTRAM) 50 MG tablet; Take 1 tablet (50 mg total) by mouth every 6 (six) hours as needed.  Dispense: 120 tablet; Refill: 2  7. Snoring - Ambulatory referral to  Sleep Studies  8. Apnea - Ambulatory referral to Sleep Studies   Continue all other maintenance medications as listed above.  Follow up plan: Return in about 4 weeks (around 06/12/2018) for recheck.  Educational handout given for [place] PA-C [facility] [address]  [place], [place] [address] [phone]   05/20/2018, 11:39 AM

## 2018-06-06 ENCOUNTER — Encounter: Payer: Medicare Other | Admitting: Obstetrics & Gynecology

## 2018-06-12 ENCOUNTER — Ambulatory Visit: Payer: Medicare Other | Admitting: Physician Assistant

## 2018-06-13 ENCOUNTER — Encounter: Payer: Self-pay | Admitting: Physician Assistant

## 2018-06-20 ENCOUNTER — Encounter: Payer: Self-pay | Admitting: Obstetrics & Gynecology

## 2018-06-20 ENCOUNTER — Ambulatory Visit (INDEPENDENT_AMBULATORY_CARE_PROVIDER_SITE_OTHER): Payer: Medicare Other | Admitting: Obstetrics & Gynecology

## 2018-06-20 ENCOUNTER — Other Ambulatory Visit: Payer: Self-pay

## 2018-06-20 VITALS — BP 113/66 | HR 100 | Ht 66.0 in | Wt 375.0 lb

## 2018-06-20 DIAGNOSIS — N950 Postmenopausal bleeding: Secondary | ICD-10-CM

## 2018-06-20 DIAGNOSIS — N3281 Overactive bladder: Secondary | ICD-10-CM | POA: Diagnosis not present

## 2018-06-20 MED ORDER — SOLIFENACIN SUCCINATE 10 MG PO TABS: 10.0000 mg | ORAL_TABLET | Freq: Every day | ORAL | 11 refills | Status: DC

## 2018-06-20 NOTE — Progress Notes (Signed)
Chief Complaint  Patient presents with  . Advice Only    2nd opinion on polyps; cyst on ovary; feels like bladder has fallen      47 y.o. No obstetric history on file. No LMP recorded. (Menstrual status: Irregular Periods). The current method of family planning is none.  Outpatient Encounter Medications as of 06/20/2018  Medication Sig  . albuterol (PROVENTIL HFA;VENTOLIN HFA) 108 (90 Base) MCG/ACT inhaler Inhale 2 puffs into the lungs every 6 (six) hours as needed for wheezing or shortness of breath.  Marland Kitchen atorvastatin (LIPITOR) 20 MG tablet Take 1 tablet (20 mg total) by mouth daily at 6 PM.  . blood glucose meter kit and supplies Check BS up to four times a day  . Blood Glucose Monitoring Suppl DEVI Check bs up to four times a day  . BYETTA 5 MCG PEN 5 MCG/0.02ML SOPN injection INJECT 5MCG SUBCUTANEOUSLY TWICE DAILY  . cyclobenzaprine (FLEXERIL) 10 MG tablet Take 1 tablet (10 mg total) by mouth 3 (three) times daily as needed for muscle spasms.  . diclofenac (CATAFLAM) 50 MG tablet Take 1 tablet (50 mg total) by mouth 2 (two) times daily.  . fluconazole (DIFLUCAN) 150 MG tablet Take 1 tablet (150 mg total) by mouth once a week.  Marland Kitchen FLUoxetine (PROZAC) 20 MG capsule TAKE THREE (3) CAPSULES BY MOUTH ONCE DAILY. (MORNING)  . furosemide (LASIX) 40 MG tablet TAKE 1 TABLET BY MOUTH TWICE DAILY  . glucose blood (ACCU-CHEK AVIVA) test strip Use to check blood sugar four times daily  . Insulin Lispro (HUMALOG KWIKPEN) 200 UNIT/ML SOPN Inject 10-30 Units into the skin 2 (two) times daily before a meal.  . Insulin Pen Needle 31G X 8 MM MISC 1 Units by Does not apply route 4 (four) times daily.  Marland Kitchen ipratropium-albuterol (DUONEB) 0.5-2.5 (3) MG/3ML SOLN Take 3 mLs by nebulization every 6 (six) hours as needed.  [employer] Guise Devices MISC Test QID, DX E11.65, Aviva Plus  . lisinopril (PRINIVIL,ZESTRIL) 5 MG tablet Take 1 tablet (5 mg total) by mouth daily.  . mirtazapine (REMERON) 45 MG tablet Take  1 tablet (45 mg total) by mouth at bedtime.  Marland Kitchen OLANZapine (ZYPREXA) 10 MG tablet Take 1 tablet (10 mg total) by mouth at bedtime.  Marland Kitchen omeprazole (PRILOSEC) 20 MG capsule TAKE ONE CAPSULE BY MOUTH ONCE DAILY. (MORNING)  . oxybutynin (DITROPAN XL) 15 MG 24 hr tablet Take 1 tablet (15 mg total) by mouth 2 (two) times daily.  . potassium chloride (K-DUR) 10 MEQ tablet TAKE 1 TABLET BY MOUTH TWICE DAILY  . SPIRIVA HANDIHALER 18 MCG inhalation capsule INHALE ONE PUFF BY MOUTH DAILY.  Marland Kitchen spironolactone (ALDACTONE) 50 MG tablet Take 1 tablet (50 mg total) by mouth every morning.  . SYMBICORT 160-4.5 MCG/ACT inhaler INHALE 2 PUFFS BY MOUTH TWICE DAILY.  . traMADol (ULTRAM) 50 MG tablet Take 1 tablet (50 mg total) by mouth every 6 (six) hours as needed.  . traZODone (DESYREL) 100 MG tablet Take 1 tablet (100 mg total) by mouth at bedtime.  Marland Kitchen zolpidem (AMBIEN) 10 MG tablet Take 1 tablet (10 mg total) by mouth at bedtime.  . Insulin Glargine (LANTUS SOLOSTAR) 100 UNIT/ML Solostar Pen Inject 50-100 Units into the skin 1 day or 1 dose for 1 dose.  . solifenacin (VESICARE) 10 MG tablet Take 1 tablet (10 mg total) by mouth daily.   No facility-administered encounter medications on file as of 06/20/2018.     Subjective Pt is  seen for a second opinion Records are provided from [facility] in [place] She had PMB in January 2019, had cervical stenosis and had OR EMB which revealed polypoid endometrium benign No bleeding since  Also has concerns her bladder has dropped Loses urine daily multiple times without provocation Gets up 4-5 times per night Past Medical History:  Diagnosis Date  . Asthma   . Bipolar affect, depressed ([place])   . Cellulitis   . COPD (chronic obstructive pulmonary disease) ([place])   . Depression   . Diabetes mellitus without complication ([place])   . PE (pulmonary embolism)   . Pulmonary edema     Past Surgical History:  Procedure Laterality Date  . CESAREAN SECTION    .  CHOLECYSTECTOMY      OB History   None     No Known Allergies  Social History   Socioeconomic History  . Marital status: Married    Spouse name: Not on file  . Number of children: Not on file  . Years of education: Not on file  . Highest education level: Not on file  Occupational History  . Not on file  Social Needs  . Financial resource strain: Not on file  . Food insecurity:    Worry: Not on file    Inability: Not on file  . Transportation needs:    Medical: Not on file    Non-medical: Not on file  Tobacco Use  . Smoking status: Never Smoker  . Smokeless tobacco: Never Used  Substance and Sexual Activity  . Alcohol use: No  . Drug use: No  . Sexual activity: Not Currently  Lifestyle  . Physical activity:    Days per week: Not on file    Minutes per session: Not on file  . Stress: Not on file  Relationships  . Social connections:    Talks on phone: Not on file    Gets together: Not on file    Attends religious service: Not on file    Active member of club or organization: Not on file    Attends meetings of clubs or organizations: Not on file    Relationship status: Not on file  Other Topics Concern  . Not on file  Social History Narrative  . Not on file    Family History  Problem Relation Age of Onset  . Cancer Mother        breast  . Heart failure Father        died of AMI    Medications:       Current Outpatient Medications:  .  albuterol (PROVENTIL HFA;VENTOLIN HFA) 108 (90 Base) MCG/ACT inhaler, Inhale 2 puffs into the lungs every 6 (six) hours as needed for wheezing or shortness of breath., Disp: 1 Inhaler, Rfl: 0 .  atorvastatin (LIPITOR) 20 MG tablet, Take 1 tablet (20 mg total) by mouth daily at 6 PM., Disp: 90 tablet, Rfl: 0 .  blood glucose meter kit and supplies, Check BS up to four times a day, Disp: 1 each, Rfl: 0 .  Blood Glucose Monitoring Suppl DEVI, Check bs up to four times a day, Disp: 1 each, Rfl: 0 .  BYETTA 5 MCG PEN 5  MCG/0.02ML SOPN injection, INJECT 5MCG SUBCUTANEOUSLY TWICE DAILY, Disp: 8.4 mL, Rfl: 0 .  cyclobenzaprine (FLEXERIL) 10 MG tablet, Take 1 tablet (10 mg total) by mouth 3 (three) times daily as needed for muscle spasms., Disp: 90 tablet, Rfl: 5 .  diclofenac (CATAFLAM) 50 MG tablet,  Take 1 tablet (50 mg total) by mouth 2 (two) times daily., Disp: 60 tablet, Rfl: 0 .  fluconazole (DIFLUCAN) 150 MG tablet, Take 1 tablet (150 mg total) by mouth once a week., Disp: 4 tablet, Rfl: 11 .  FLUoxetine (PROZAC) 20 MG capsule, TAKE THREE (3) CAPSULES BY MOUTH ONCE DAILY. (MORNING), Disp: 90 capsule, Rfl: 3 .  furosemide (LASIX) 40 MG tablet, TAKE 1 TABLET BY MOUTH TWICE DAILY, Disp: 60 tablet, Rfl: 4 .  glucose blood (ACCU-CHEK AVIVA) test strip, Use to check blood sugar four times daily, Disp: 200 each, Rfl: 12 .  Insulin Lispro (HUMALOG KWIKPEN) 200 UNIT/ML SOPN, Inject 10-30 Units into the skin 2 (two) times daily before a meal., Disp: 3 mL, Rfl: 5 .  Insulin Pen Needle 31G X 8 MM MISC, 1 Units by Does not apply route 4 (four) times daily., Disp: 100 each, Rfl: 2 .  ipratropium-albuterol (DUONEB) 0.5-2.5 (3) MG/3ML SOLN, Take 3 mLs by nebulization every 6 (six) hours as needed., Disp: , Rfl:  .  Lancet Devices MISC, Test QID, DX E11.65, Aviva Plus, Disp: 100 each, Rfl: prn .  lisinopril (PRINIVIL,ZESTRIL) 5 MG tablet, Take 1 tablet (5 mg total) by mouth daily., Disp: 90 tablet, Rfl: 3 .  mirtazapine (REMERON) 45 MG tablet, Take 1 tablet (45 mg total) by mouth at bedtime., Disp: 30 tablet, Rfl: 11 .  OLANZapine (ZYPREXA) 10 MG tablet, Take 1 tablet (10 mg total) by mouth at bedtime., Disp: 90 tablet, Rfl: 0 .  omeprazole (PRILOSEC) 20 MG capsule, TAKE ONE CAPSULE BY MOUTH ONCE DAILY. (MORNING), Disp: 30 capsule, Rfl: 5 .  oxybutynin (DITROPAN XL) 15 MG 24 hr tablet, Take 1 tablet (15 mg total) by mouth 2 (two) times daily., Disp: 30 tablet, Rfl: 0 .  potassium chloride (K-DUR) 10 MEQ tablet, TAKE 1 TABLET BY  MOUTH TWICE DAILY, Disp: 60 tablet, Rfl: 4 .  SPIRIVA HANDIHALER 18 MCG inhalation capsule, INHALE ONE PUFF BY MOUTH DAILY., Disp: , Rfl: 12 .  spironolactone (ALDACTONE) 50 MG tablet, Take 1 tablet (50 mg total) by mouth every morning., Disp: 90 tablet, Rfl: 0 .  SYMBICORT 160-4.5 MCG/ACT inhaler, INHALE 2 PUFFS BY MOUTH TWICE DAILY., Disp: 10.2 g, Rfl: 2 .  traMADol (ULTRAM) 50 MG tablet, Take 1 tablet (50 mg total) by mouth every 6 (six) hours as needed., Disp: 120 tablet, Rfl: 2 .  traZODone (DESYREL) 100 MG tablet, Take 1 tablet (100 mg total) by mouth at bedtime., Disp: 90 tablet, Rfl: 0 .  zolpidem (AMBIEN) 10 MG tablet, Take 1 tablet (10 mg total) by mouth at bedtime., Disp: 30 tablet, Rfl: 2 .  Insulin Glargine (LANTUS SOLOSTAR) 100 UNIT/ML Solostar Pen, Inject 50-100 Units into the skin 1 day or 1 dose for 1 dose., Disp: 10 pen, Rfl: PRN .  solifenacin (VESICARE) 10 MG tablet, Take 1 tablet (10 mg total) by mouth daily., Disp: 30 tablet, Rfl: 11  Objective Blood pressure 113/66, pulse 100, height 5' 6" (1.676 m), weight (!) 375 lb (170.1 kg).  General WDWN female NAD Vulva:  normal appearing vulva with no masses, tenderness or lesions Vagina:  Well supported bladder uterus and rectum Cervix:  no cervical motion tenderness and no lesions Uterus:  Due to morbid obesity unable to determine size of uterus or ovarian status, recent sonogram + CT normal Adnexa: ovaries:,     Pertinent ROS No burning with urination, frequency or urgency No nausea, vomiting or diarrhea Nor fever chills or other  constitutional symptoms   Labs or studies Reviewed her records from [facility]    Impression Diagnoses this Encounter::   ICD-10-CM   1. OAB (overactive bladder) N32.81   2. PMB (postmenopausal bleeding) N95.0    resolved    Established relevant diagnosis(es):   Plan/Recommendations: Meds ordered this encounter  Medications  . solifenacin (VESICARE) 10 MG tablet    Sig: Take 1 tablet  (10 mg total) by mouth daily.    Dispense:  30 tablet    Refill:  11    Labs or Scans Ordered: No orders of the defined types were placed in this encounter.   Management:: >vesicare with follow up 6 weeks  Follow up Return in about 6 weeks (around 08/01/2018) for Follow up, with Dr Elonda Husky.      All questions were answered.

## 2018-07-11 ENCOUNTER — Institutional Professional Consult (permissible substitution): Payer: Medicare Other | Admitting: Neurology

## 2018-07-11 ENCOUNTER — Telehealth: Payer: Self-pay | Admitting: *Deleted

## 2018-07-11 NOTE — Telephone Encounter (Signed)
Patient was no show for new patient appointment today re: sleep consult.

## 2018-07-12 ENCOUNTER — Encounter: Payer: Self-pay | Admitting: Neurology

## 2018-08-01 ENCOUNTER — Ambulatory Visit: Payer: Medicare Other | Admitting: Obstetrics & Gynecology

## 2018-08-06 ENCOUNTER — Telehealth: Payer: Self-pay | Admitting: *Deleted

## 2018-08-29 ENCOUNTER — Ambulatory Visit: Payer: Medicare Other | Admitting: Family Medicine

## 2018-09-04 IMAGING — CT CT ABD-PELV W/ CM
2 of 5 series · 16 of 46 positions shown, 18 images · IV contrast (Isovue)
Comparison: Abdominal ultrasound 09/17/2017

CLINICAL DATA: Epigastric pain and right-sided abdominal pain for 2
weeks.

EXAM:
CT ABDOMEN AND PELVIS WITH CONTRAST
TECHNIQUE: Multidetector CT imaging of the abdomen and pelvis was performed
using the standard protocol following bolus administration of
intravenous contrast.
CONTRAST:  125mL UAM9IX-AOO IOPAMIDOL (UAM9IX-AOO) INJECTION 61%

[Series 6: coronal st · coronal · 0.96mm/px · 3 of 127 slices shown]
[im 43/127  soft-tissue]
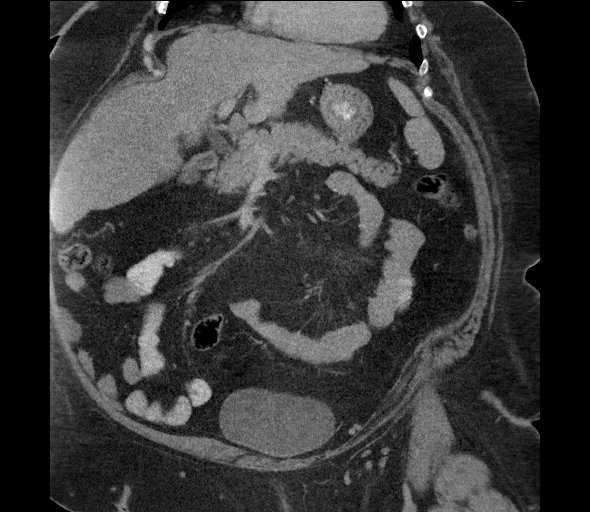
[im 57/127  soft-tissue]
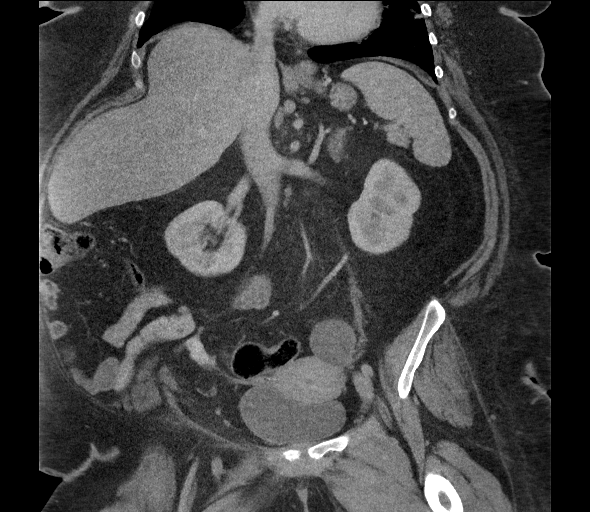
[im 71/127  soft-tissue]
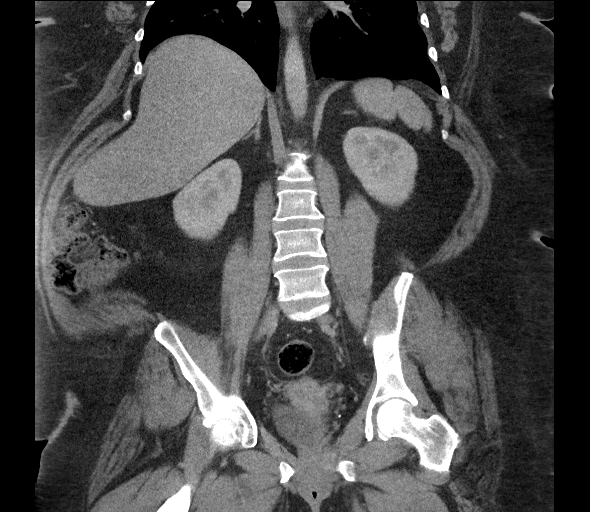

[Series 9: axial st · axial · 0.98mm/px · z∈[+908,+1343]mm · 13 of 99 slices shown, 15 images]
[im 6/99  soft-tissue]
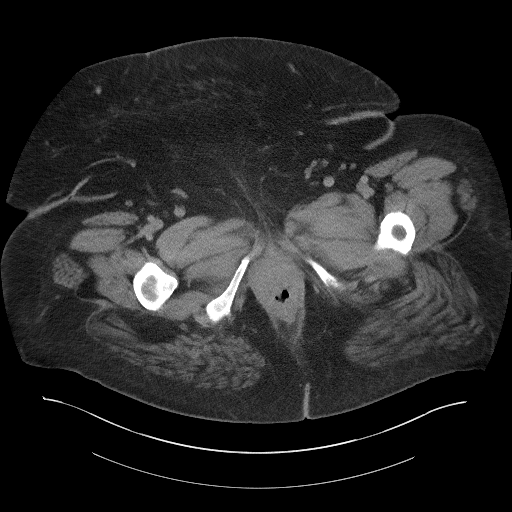
[im 6/99  bone]
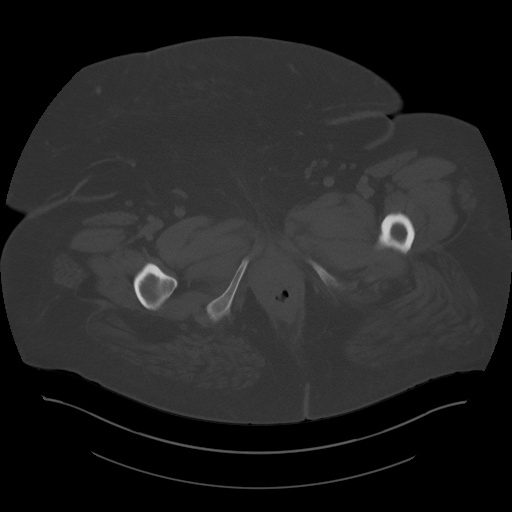
[im 11/99  soft-tissue]
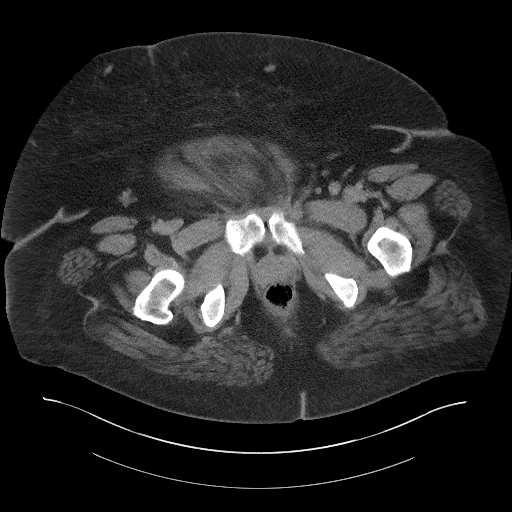
[im 22/99  soft-tissue]
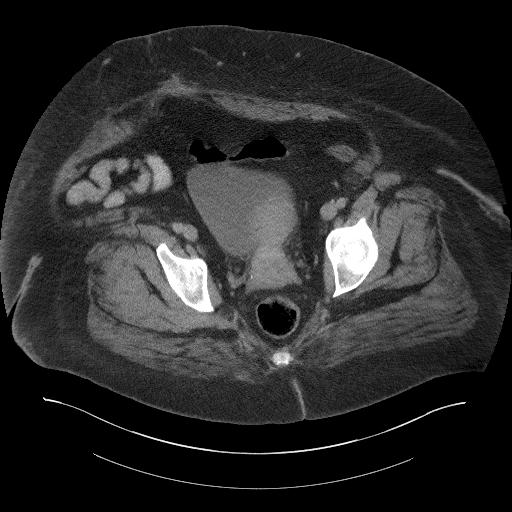
[im 28/99  soft-tissue]
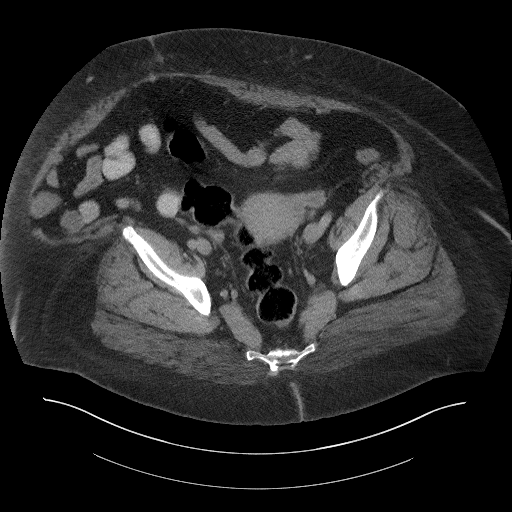
[im 33/99  soft-tissue]
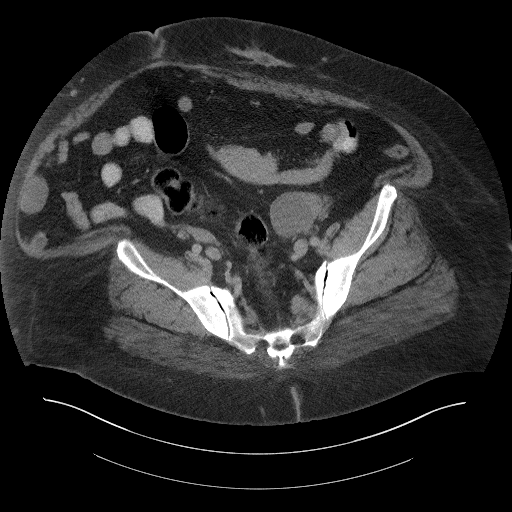
[im 44/99  soft-tissue]
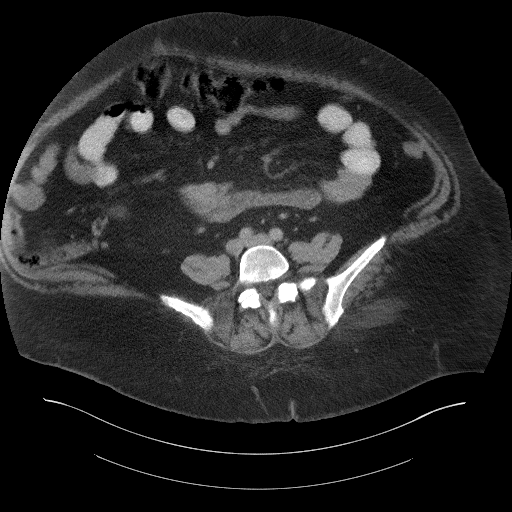
[im 50/99  soft-tissue]
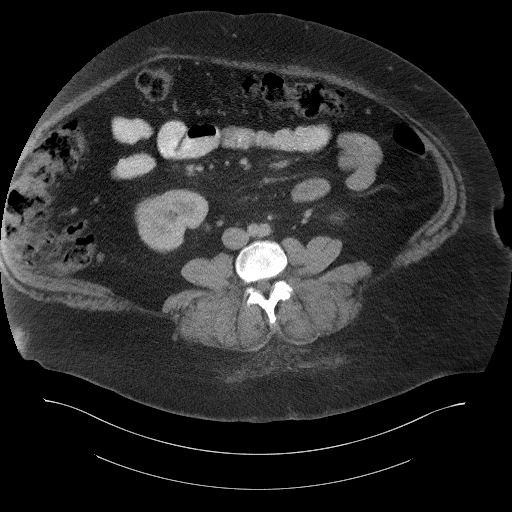
[im 55/99  soft-tissue]
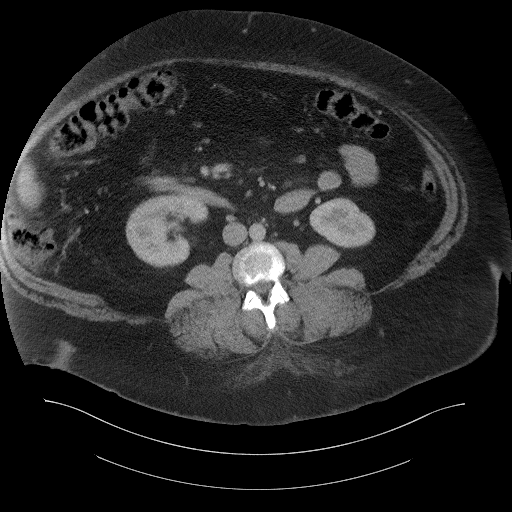
[im 66/99  soft-tissue]
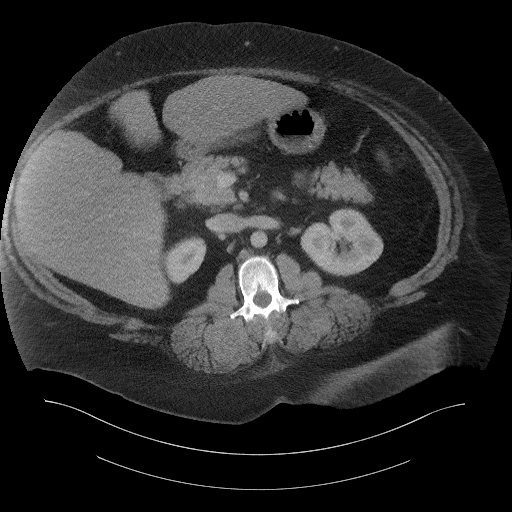
[im 66/99  bone]
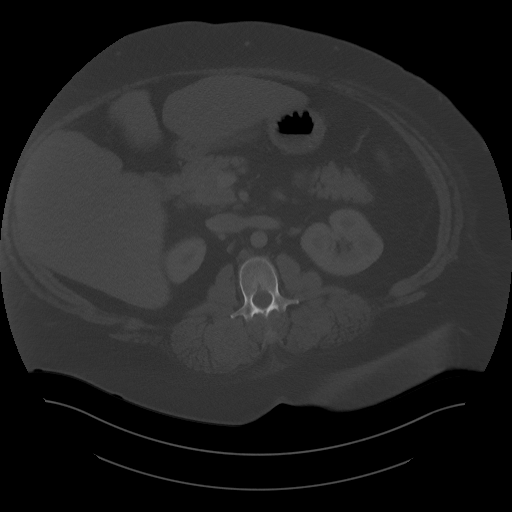
[im 71/99  soft-tissue]
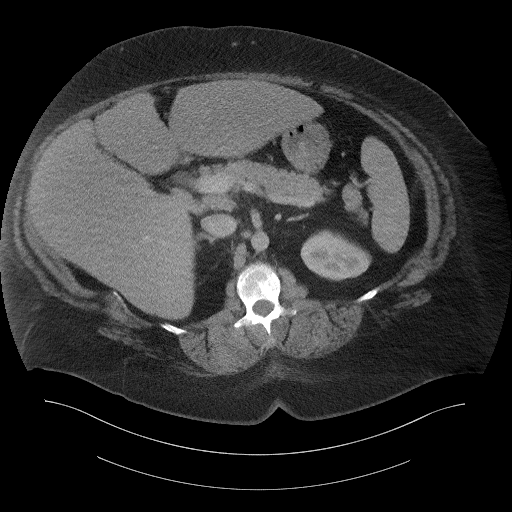
[im 77/99  soft-tissue]
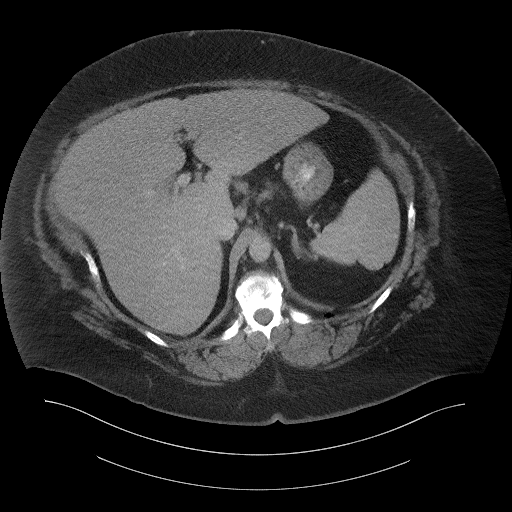
[im 88/99  soft-tissue]
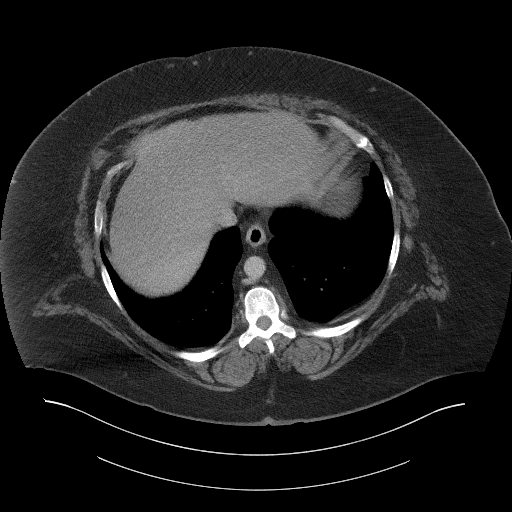
[im 93/99  soft-tissue]
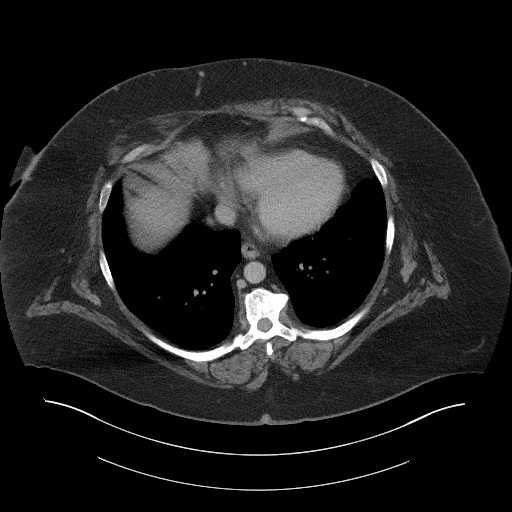

[16 of 46 positions shown; findings below may reference images not displayed]

FINDINGS: Lower chest: No acute abnormality.

Hepatobiliary: Hepatic steatosis. Postcholecystectomy. No evidence
of biliary ductal dilation.

Pancreas: Unremarkable. No pancreatic ductal dilatation or
surrounding inflammatory changes.

Spleen: Normal in size without focal abnormality.

Adrenals/Urinary Tract: Adrenal glands are unremarkable. Kidneys are
normal, without renal calculi, focal lesion, or hydronephrosis.
Bladder is unremarkable.

Stomach/Bowel: Stomach is within normal limits. Appendix appears
normal. No evidence of bowel wall thickening, distention, or
inflammatory changes.

Vascular/Lymphatic: No significant vascular findings are present. No
enlarged abdominal or pelvic lymph nodes. Multiple retroperitoneal
and mesenteric lymph nodes, sub pathologic by size criteria.

Reproductive: Normal appearance of the uterus. 4.6 cm left ovarian
cyst. Fallopian tube occlusion devices in place.

Other: Small fat containing periumbilical anterior abdominal hernia.

Musculoskeletal: Chronic compression fracture of T12 vertebral body
with approximately 40% height loss. Soft tissue component within the
fractured vertebral body rises the possibility of a pathologic
fracture.
IMPRESSION: Hepatic steatosis.

Otherwise no acute abnormality within the solid abdominal organs.

Multiple retroperitoneal and mesenteric lymph nodes, sub pathologic
by size criteria.

4.6 cm left ovarian cyst.

Chronic nonunion compression fracture of T12 vertebral body with
soft tissue component within the fracture line. This may represent a
disc extrusion into the fracture line. Potentially, this could also
represent a pathologic fracture through a pre-existing bone lesion.

## 2018-09-12 NOTE — Telephone Encounter (Signed)
lmtcb for flu shot 

## 2018-09-17 ENCOUNTER — Other Ambulatory Visit: Payer: Self-pay | Admitting: Physician Assistant

## 2018-09-19 ENCOUNTER — Ambulatory Visit: Payer: Medicare Other | Admitting: Pediatrics

## 2018-09-19 ENCOUNTER — Other Ambulatory Visit: Payer: Self-pay | Admitting: *Deleted

## 2018-09-19 MED ORDER — OMEPRAZOLE 20 MG PO CPDR: DELAYED_RELEASE_CAPSULE | ORAL | 2 refills | Status: DC

## 2018-09-20 ENCOUNTER — Ambulatory Visit: Payer: Medicare Other | Admitting: Physician Assistant

## 2018-10-16 ENCOUNTER — Other Ambulatory Visit: Payer: Self-pay | Admitting: Physician Assistant

## 2018-10-17 ENCOUNTER — Institutional Professional Consult (permissible substitution): Payer: Medicare Other | Admitting: Neurology

## 2018-10-17 ENCOUNTER — Telehealth: Payer: Self-pay | Admitting: Neurology

## 2018-10-17 ENCOUNTER — Encounter: Payer: Self-pay | Admitting: Neurology

## 2018-10-17 NOTE — Telephone Encounter (Signed)
Called the patient and there was no answer. LVM informing the patient that today 10/17/2018 our office is closing early due to weather. Called to advised the patient to call back and reschedule her sleep consult apt. Sheena in sleep lab has also called the patient and left messages.

## 2018-10-23 ENCOUNTER — Other Ambulatory Visit: Payer: Self-pay | Admitting: Physician Assistant

## 2018-10-23 DIAGNOSIS — F3132 Bipolar disorder, current episode depressed, moderate: Secondary | ICD-10-CM

## 2018-10-24 NOTE — Telephone Encounter (Signed)
Last seen 05/15/18

## 2018-11-11 ENCOUNTER — Other Ambulatory Visit: Payer: Self-pay | Admitting: Physician Assistant

## 2018-11-11 DIAGNOSIS — E1169 Type 2 diabetes mellitus with other specified complication: Secondary | ICD-10-CM

## 2018-11-11 DIAGNOSIS — E785 Hyperlipidemia, unspecified: Principal | ICD-10-CM

## 2018-11-12 ENCOUNTER — Other Ambulatory Visit: Payer: Self-pay | Admitting: Physician Assistant

## 2018-11-12 DIAGNOSIS — E785 Hyperlipidemia, unspecified: Secondary | ICD-10-CM

## 2018-11-12 DIAGNOSIS — E1169 Type 2 diabetes mellitus with other specified complication: Secondary | ICD-10-CM

## 2018-11-12 DIAGNOSIS — G8929 Other chronic pain: Secondary | ICD-10-CM

## 2018-11-12 DIAGNOSIS — E1165 Type 2 diabetes mellitus with hyperglycemia: Secondary | ICD-10-CM

## 2018-11-12 DIAGNOSIS — M5442 Lumbago with sciatica, left side: Secondary | ICD-10-CM

## 2018-11-12 DIAGNOSIS — E109 Type 1 diabetes mellitus without complications: Secondary | ICD-10-CM

## 2018-11-12 DIAGNOSIS — F3132 Bipolar disorder, current episode depressed, moderate: Secondary | ICD-10-CM

## 2018-12-02 ENCOUNTER — Telehealth: Payer: Self-pay | Admitting: Physician Assistant

## 2018-12-02 NOTE — Telephone Encounter (Signed)
appt made

## 2018-12-03 ENCOUNTER — Telehealth (INDEPENDENT_AMBULATORY_CARE_PROVIDER_SITE_OTHER): Payer: Medicare Other | Admitting: Physician Assistant

## 2018-12-03 ENCOUNTER — Ambulatory Visit: Payer: Medicare Other | Admitting: Physician Assistant

## 2018-12-03 ENCOUNTER — Other Ambulatory Visit: Payer: Self-pay

## 2018-12-03 DIAGNOSIS — E109 Type 1 diabetes mellitus without complications: Secondary | ICD-10-CM | POA: Diagnosis not present

## 2018-12-03 DIAGNOSIS — G629 Polyneuropathy, unspecified: Secondary | ICD-10-CM | POA: Diagnosis not present

## 2018-12-03 DIAGNOSIS — M2550 Pain in unspecified joint: Secondary | ICD-10-CM

## 2018-12-03 MED ORDER — GABAPENTIN 100 MG PO CAPS: 100.0000 mg | ORAL_CAPSULE | Freq: Every day | ORAL | 3 refills | Status: DC

## 2018-12-03 MED ORDER — DICLOFENAC POTASSIUM 50 MG PO TABS: 50.0000 mg | ORAL_TABLET | Freq: Two times a day (BID) | ORAL | 2 refills | Status: DC

## 2018-12-03 NOTE — Progress Notes (Signed)
Telephone visit  Subjective: CC: Multiple medical conditions including chronic foot pain, osteoarthritis diabetes. PCP: Aimee Sleeper, PA-C WGN:FAOZHY [name] is a 48 y.o. female calls for telephone consult today. Patient provides verbal consent for consult held via phone.  Location of patient: home Location of provider: [facility] Others present for call: Husband  Remarkable for feet pain (patient has had ongoing feet pain for some years and it has increased in recent months it is quite sharp and burning at times.  Nothing she takes has helped.  She has never taken gabapentin to try for neuropathy.  All of her labs have been clear.  Her last A1c was 7.0 that was just in February.  Her insurance sent a nurse to her home and it was well controlled  She also has chronic back and joint pain related to arthritis.  She has not been on the diclofenac for a few weeks and does need refills.  She is not getting good relief with over-the-counter medications.  Is causing her to have a lot of pain in her hand joints.  She does not swell.  That does not get red or hot.  She is not having any weakness.  We have discussed her diabetes and again her A1c was 7.1 in February.  She has had fairly good readings of her sugars in the 90s and low 100s.  She has not had any very high readings in some time.    I am going to have another visit with her as a telephonic or virtual visit in about 4 weeks and then she will have labs performed later in the summer.   ROS: Per HPI  No Known Allergies Past Medical History:  Diagnosis Date  . Asthma   . Bipolar affect, depressed ([place])   . Cellulitis   . COPD (chronic obstructive pulmonary disease) ([place])   . Depression   . Diabetes mellitus without complication ([place])   . PE (pulmonary embolism)   . Pulmonary edema     Current Outpatient Medications:  .  albuterol (PROVENTIL HFA;VENTOLIN HFA) 108 (90 Base) MCG/ACT inhaler, Inhale 2 puffs into the lungs every 6  (six) hours as needed for wheezing or shortness of breath., Disp: 1 Inhaler, Rfl: 0 .  atorvastatin (LIPITOR) 20 MG tablet, Take 1 tablet (20 mg total) by mouth daily at 6 PM. (Needs to be seen before next refill), Disp: 30 tablet, Rfl: 0 .  blood glucose meter kit and supplies, Check BS up to four times a day, Disp: 1 each, Rfl: 0 .  Blood Glucose Monitoring Suppl DEVI, Check bs up to four times a day, Disp: 1 each, Rfl: 0 .  BYETTA 5 MCG PEN 5 MCG/0.02ML SOPN injection, INJECT 5MCG SUBCUTANEOUSLY TWICE DAILY, Disp: 8.4 mL, Rfl: 0 .  cyclobenzaprine (FLEXERIL) 10 MG tablet, Take 1 tablet (10 mg total) by mouth 3 (three) times daily as needed for muscle spasms., Disp: 90 tablet, Rfl: 5 .  diclofenac (CATAFLAM) 50 MG tablet, Take 1 tablet (50 mg total) by mouth 2 (two) times daily., Disp: 60 tablet, Rfl: 0 .  fluconazole (DIFLUCAN) 150 MG tablet, Take 1 tablet (150 mg total) by mouth once a week., Disp: 4 tablet, Rfl: 11 .  FLUoxetine (PROZAC) 20 MG capsule, TAKE THREE (3) CAPSULES BY MOUTH ONCE DAILY. (MORNING), Disp: 90 capsule, Rfl: 0 .  furosemide (LASIX) 40 MG tablet, TAKE 1 TABLET BY MOUTH TWICE DAILY, Disp: 60 tablet, Rfl: 4 .  glucose blood (ACCU-CHEK AVIVA)  test strip, Use to check blood sugar four times daily, Disp: 200 each, Rfl: 12 .  Insulin Glargine (LANTUS SOLOSTAR) 100 UNIT/ML Solostar Pen, Inject 50-100 Units into the skin 1 day or 1 dose for 1 dose., Disp: 10 pen, Rfl: PRN .  Insulin Lispro (HUMALOG KWIKPEN) 200 UNIT/ML SOPN, Inject 10-30 Units into the skin 2 (two) times daily before a meal., Disp: 3 mL, Rfl: 5 .  Insulin Pen Needle 31G X 8 MM MISC, 1 Units by Does not apply route 4 (four) times daily., Disp: 100 each, Rfl: 2 .  ipratropium-albuterol (DUONEB) 0.5-2.5 (3) MG/3ML SOLN, Take 3 mLs by nebulization every 6 (six) hours as needed., Disp: , Rfl:  .  Lancet Devices MISC, Test QID, DX E11.65, Aviva Plus, Disp: 100 each, Rfl: prn .  lisinopril (PRINIVIL,ZESTRIL) 5 MG tablet,  Take 1 tablet (5 mg total) by mouth daily., Disp: 90 tablet, Rfl: 3 .  mirtazapine (REMERON) 45 MG tablet, Take 1 tablet (45 mg total) by mouth at bedtime., Disp: 30 tablet, Rfl: 11 .  OLANZapine (ZYPREXA) 10 MG tablet, Take 1 tablet (10 mg total) by mouth at bedtime., Disp: 90 tablet, Rfl: 0 .  omeprazole (PRILOSEC) 20 MG capsule, TAKE ONE CAPSULE BY MOUTH ONCE DAILY. (MORNING), Disp: 30 capsule, Rfl: 2 .  oxybutynin (DITROPAN XL) 15 MG 24 hr tablet, Take 1 tablet (15 mg total) by mouth 2 (two) times daily., Disp: 30 tablet, Rfl: 0 .  potassium chloride (K-DUR) 10 MEQ tablet, TAKE 1 TABLET BY MOUTH TWICE DAILY, Disp: 60 tablet, Rfl: 4 .  potassium chloride (K-DUR,KLOR-CON) 10 MEQ tablet, TAKE ONE TABLET BY MOUTH TWICE DAILY. (MORNING ,BEDTIME), Disp: 60 tablet, Rfl: 3 .  solifenacin (VESICARE) 10 MG tablet, Take 1 tablet (10 mg total) by mouth daily., Disp: 30 tablet, Rfl: 11 .  SPIRIVA HANDIHALER 18 MCG inhalation capsule, INHALE ONE PUFF BY MOUTH DAILY., Disp: , Rfl: 12 .  spironolactone (ALDACTONE) 50 MG tablet, TAKE ONE TABLET BY MOUTH EVERY MORNING., Disp: 90 tablet, Rfl: 0 .  SYMBICORT 160-4.5 MCG/ACT inhaler, INHALE 2 PUFFS BY MOUTH TWICE DAILY., Disp: 10.2 g, Rfl: 2 .  traMADol (ULTRAM) 50 MG tablet, Take 1 tablet (50 mg total) by mouth every 6 (six) hours as needed., Disp: 120 tablet, Rfl: 2 .  traZODone (DESYREL) 100 MG tablet, Take 1 tablet (100 mg total) by mouth at bedtime., Disp: 90 tablet, Rfl: 0 .  zolpidem (AMBIEN) 10 MG tablet, Take 1 tablet (10 mg total) by mouth at bedtime., Disp: 30 tablet, Rfl: 2  Assessment/ Plan: 48 y.o. female   1. Type 1 diabetes mellitus without complications (HCC) Continue medications Labs in summer  2. Arthralgia, unspecified joint - diclofenac (CATAFLAM) 50 MG tablet; Take 1 tablet (50 mg total) by mouth 2 (two) times daily.  Dispense: 60 tablet; Refill: 2 - gabapentin (NEURONTIN) 100 MG capsule; Take 1-3 capsules (100-300 mg total) by mouth at  bedtime. Increase by one tablet each week  Dispense: 90 capsule; Refill: 3  3. Neuropathy - gabapentin (NEURONTIN) 100 MG capsule; Take 1-3 capsules (100-300 mg total) by mouth at bedtime. Increase by one tablet each week  Dispense: 90 capsule; Refill: 3   Start time: 2:32 End time: 2:40  No orders of the defined types were placed in this encounter.   Particia Nearing PA-C [place] [phone]

## 2018-12-07 ENCOUNTER — Other Ambulatory Visit: Payer: Self-pay | Admitting: Physician Assistant

## 2018-12-07 DIAGNOSIS — F3132 Bipolar disorder, current episode depressed, moderate: Secondary | ICD-10-CM

## 2018-12-09 NOTE — Telephone Encounter (Signed)
Last seen 05/15/18

## 2018-12-12 ENCOUNTER — Other Ambulatory Visit: Payer: Self-pay | Admitting: Physician Assistant

## 2019-01-02 ENCOUNTER — Other Ambulatory Visit: Payer: Self-pay | Admitting: Physician Assistant

## 2019-01-02 ENCOUNTER — Encounter: Payer: Self-pay | Admitting: Physician Assistant

## 2019-01-02 ENCOUNTER — Ambulatory Visit (INDEPENDENT_AMBULATORY_CARE_PROVIDER_SITE_OTHER): Payer: Medicare Other | Admitting: Physician Assistant

## 2019-01-02 ENCOUNTER — Other Ambulatory Visit: Payer: Self-pay

## 2019-01-02 DIAGNOSIS — G629 Polyneuropathy, unspecified: Secondary | ICD-10-CM | POA: Diagnosis not present

## 2019-01-02 DIAGNOSIS — M5442 Lumbago with sciatica, left side: Principal | ICD-10-CM

## 2019-01-02 DIAGNOSIS — IMO0002 Reserved for concepts with insufficient information to code with codable children: Secondary | ICD-10-CM

## 2019-01-02 DIAGNOSIS — F3132 Bipolar disorder, current episode depressed, moderate: Secondary | ICD-10-CM

## 2019-01-02 DIAGNOSIS — J449 Chronic obstructive pulmonary disease, unspecified: Secondary | ICD-10-CM

## 2019-01-02 DIAGNOSIS — M2550 Pain in unspecified joint: Secondary | ICD-10-CM

## 2019-01-02 DIAGNOSIS — E1169 Type 2 diabetes mellitus with other specified complication: Secondary | ICD-10-CM

## 2019-01-02 DIAGNOSIS — G8929 Other chronic pain: Secondary | ICD-10-CM

## 2019-01-02 DIAGNOSIS — E1065 Type 1 diabetes mellitus with hyperglycemia: Secondary | ICD-10-CM

## 2019-01-02 DIAGNOSIS — E785 Hyperlipidemia, unspecified: Secondary | ICD-10-CM

## 2019-01-02 MED ORDER — TRAMADOL HCL 50 MG PO TABS: 50.0000 mg | ORAL_TABLET | Freq: Four times a day (QID) | ORAL | 2 refills | Status: DC | PRN

## 2019-01-02 MED ORDER — GABAPENTIN 100 MG PO CAPS: 100.0000 mg | ORAL_CAPSULE | Freq: Two times a day (BID) | ORAL | 3 refills | Status: DC

## 2019-01-02 MED ORDER — OLANZAPINE 10 MG PO TABS: 10.0000 mg | ORAL_TABLET | Freq: Every day | ORAL | 0 refills | Status: DC

## 2019-01-02 MED ORDER — CYCLOBENZAPRINE HCL 10 MG PO TABS: 10.0000 mg | ORAL_TABLET | Freq: Three times a day (TID) | ORAL | 5 refills | Status: DC | PRN

## 2019-01-02 MED ORDER — BUDESONIDE-FORMOTEROL FUMARATE 160-4.5 MCG/ACT IN AERO: 2.0000 | INHALATION_SPRAY | Freq: Two times a day (BID) | RESPIRATORY_TRACT | 11 refills | Status: DC

## 2019-01-02 MED ORDER — ATORVASTATIN CALCIUM 20 MG PO TABS: 20.0000 mg | ORAL_TABLET | Freq: Every day | ORAL | 0 refills | Status: DC

## 2019-01-02 MED ORDER — SPIRIVA HANDIHALER 18 MCG IN CAPS: ORAL_CAPSULE | RESPIRATORY_TRACT | 12 refills | Status: DC

## 2019-01-02 MED ORDER — SPIRONOLACTONE 50 MG PO TABS: 50.0000 mg | ORAL_TABLET | Freq: Every morning | ORAL | 1 refills | Status: DC

## 2019-01-02 MED ORDER — FUROSEMIDE 40 MG PO TABS: 40.0000 mg | ORAL_TABLET | Freq: Two times a day (BID) | ORAL | 11 refills | Status: DC

## 2019-01-02 MED ORDER — FLUOXETINE HCL 20 MG PO CAPS: 60.0000 mg | ORAL_CAPSULE | Freq: Every day | ORAL | 0 refills | Status: DC

## 2019-01-02 MED ORDER — LISINOPRIL 5 MG PO TABS: 5.0000 mg | ORAL_TABLET | Freq: Every day | ORAL | 3 refills | Status: DC

## 2019-01-02 MED ORDER — EXENATIDE 5 MCG/0.02ML ~~LOC~~ SOPN: PEN_INJECTOR | SUBCUTANEOUS | 5 refills | Status: DC

## 2019-01-02 NOTE — Progress Notes (Signed)
Telephone visit  Subjective: CC: Joint pain, neuropathy, hyperlipidemia, mood disorder, diabetes, COPD PCP: Terald Sleeper, PA-C BDZ:[id number] D Laino is a 48 y.o. female calls for telephone consult today. Patient provides verbal consent for consult held via phone.  Patient is identified with 2 separate identifiers.  At this time the entire area is on COVID-19 social distancing and stay home orders are in place.  Patient is of higher risk and therefore we are performing this by a virtual method.  Location of patient: Home Location of provider: [facility] Others present for call: No  This visit is to discuss her long-term medical conditions.  Diabetes: She has type 1 diabetes and her last A1c was performed by her home nurse it was 7.0 that was back in March.  She has been doing very good at watching her medications and diligently taking them.  She has had mostly fasting blood sugars around 150.  Her pre-meal readings are usually between 150 and 200.Marland Kitchen  She denies any hypo-glycemic events.  We will have her come in in the next 4 to 6 weeks to have labs performed and we will perform an A1c at that time.  Joint pain/neuropathy: She was started with gabapentin recently and is tolerating it fairly well.  She seems to think that her neuropathy pain is improving.  Also some of her back pain is improving.  We are going to go ahead and have her progress up to 600 mg over the next 3 months.  A prescription has been sent.  Hyperlipidemia: Medication will be sent in to her pharmacy and labs will be performed at a short interval.  Bipolar disorder: She is still taking her medications.  Somehow she her Zyprexa.  She had run out of medication and is just not gotten refills performed.  She does need this and in.  She reports that her breathing has been fairly well controlled.  She is not having to go out very much and has not had any exposure to COVID-19.   ROS: Per HPI  No Known Allergies Past Medical  History:  Diagnosis Date  . Asthma   . Bipolar affect, depressed ([place])   . Cellulitis   . COPD (chronic obstructive pulmonary disease) ([place])   . Depression   . Diabetes mellitus without complication ([place])   . PE (pulmonary embolism)   . Pulmonary edema     Current Outpatient Medications:  .  albuterol (PROVENTIL HFA;VENTOLIN HFA) 108 (90 Base) MCG/ACT inhaler, Inhale 2 puffs into the lungs every 6 (six) hours as needed for wheezing or shortness of breath., Disp: 1 Inhaler, Rfl: 0 .  atorvastatin (LIPITOR) 20 MG tablet, Take 1 tablet (20 mg total) by mouth daily at 6 PM. (Needs to be seen before next refill), Disp: 30 tablet, Rfl: 0 .  blood glucose meter kit and supplies, Check BS up to four times a day, Disp: 1 each, Rfl: 0 .  Blood Glucose Monitoring Suppl DEVI, Check bs up to four times a day, Disp: 1 each, Rfl: 0 .  budesonide-formoterol (SYMBICORT) 160-4.5 MCG/ACT inhaler, Inhale 2 puffs into the lungs 2 (two) times daily., Disp: 10.2 g, Rfl: 11 .  cyclobenzaprine (FLEXERIL) 10 MG tablet, Take 1 tablet (10 mg total) by mouth 3 (three) times daily as needed for muscle spasms., Disp: 90 tablet, Rfl: 5 .  diclofenac (CATAFLAM) 50 MG tablet, Take 1 tablet (50 mg total) by mouth 2 (two) times daily., Disp: 60 tablet, Rfl: 2 .  exenatide (  BYETTA 5 MCG PEN) 5 MCG/0.02ML SOPN injection, INJECT 5MCG SUBCUTANEOUSLY TWICE DAILY, Disp: 8.4 mL, Rfl: 5 .  fluconazole (DIFLUCAN) 150 MG tablet, Take 1 tablet (150 mg total) by mouth once a week., Disp: 4 tablet, Rfl: 11 .  FLUoxetine (PROZAC) 20 MG capsule, Take 3 capsules (60 mg total) by mouth daily., Disp: 90 capsule, Rfl: 0 .  furosemide (LASIX) 40 MG tablet, Take 1 tablet (40 mg total) by mouth 2 (two) times daily., Disp: 60 tablet, Rfl: 11 .  gabapentin (NEURONTIN) 100 MG capsule, Take 1-3 capsules (100-300 mg total) by mouth 2 (two) times daily. Increase by one tablet each week, Disp: 180 capsule, Rfl: 3 .  glucose blood (ACCU-CHEK AVIVA) test  strip, Use to check blood sugar four times daily, Disp: 200 each, Rfl: 12 .  Insulin Glargine (LANTUS SOLOSTAR) 100 UNIT/ML Solostar Pen, Inject 50-100 Units into the skin 1 day or 1 dose for 1 dose., Disp: 10 pen, Rfl: PRN .  Insulin Lispro (HUMALOG KWIKPEN) 200 UNIT/ML SOPN, Inject 10-30 Units into the skin 2 (two) times daily before a meal., Disp: 3 mL, Rfl: 5 .  Insulin Pen Needle 31G X 8 MM MISC, 1 Units by Does not apply route 4 (four) times daily., Disp: 100 each, Rfl: 2 .  ipratropium-albuterol (DUONEB) 0.5-2.5 (3) MG/3ML SOLN, Take 3 mLs by nebulization every 6 (six) hours as needed., Disp: , Rfl:  .  Lancet Devices MISC, Test QID, DX E11.65, Aviva Plus, Disp: 100 each, Rfl: prn .  lisinopril (ZESTRIL) 5 MG tablet, Take 1 tablet (5 mg total) by mouth daily., Disp: 90 tablet, Rfl: 3 .  mirtazapine (REMERON) 45 MG tablet, Take 1 tablet (45 mg total) by mouth at bedtime., Disp: 30 tablet, Rfl: 11 .  OLANZapine (ZYPREXA) 10 MG tablet, Take 1 tablet (10 mg total) by mouth at bedtime., Disp: 90 tablet, Rfl: 0 .  omeprazole (PRILOSEC) 20 MG capsule, TAKE ONE CAPSULE BY MOUTH ONCE DAILY. (MORNING), Disp: 90 capsule, Rfl: 3 .  potassium chloride (K-DUR,KLOR-CON) 10 MEQ tablet, TAKE ONE TABLET BY MOUTH TWICE DAILY. (MORNING ,BEDTIME), Disp: 60 tablet, Rfl: 3 .  solifenacin (VESICARE) 10 MG tablet, Take 1 tablet (10 mg total) by mouth daily., Disp: 30 tablet, Rfl: 11 .  SPIRIVA HANDIHALER 18 MCG inhalation capsule, INHALE ONE PUFF BY MOUTH DAILY., Disp: 30 capsule, Rfl: 12 .  spironolactone (ALDACTONE) 50 MG tablet, Take 1 tablet (50 mg total) by mouth every morning., Disp: 90 tablet, Rfl: 1 .  traMADol (ULTRAM) 50 MG tablet, Take 1 tablet (50 mg total) by mouth every 6 (six) hours as needed., Disp: 120 tablet, Rfl: 2  Assessment/ Plan: 48 y.o. female   1. Arthralgia, unspecified joint - gabapentin (NEURONTIN) 100 MG capsule; Take 1-3 capsules (100-300 mg total) by mouth 2 (two) times daily.  Increase by one tablet each week  Dispense: 180 capsule; Refill: 3  2. Neuropathy - gabapentin (NEURONTIN) 100 MG capsule; Take 1-3 capsules (100-300 mg total) by mouth 2 (two) times daily. Increase by one tablet each week  Dispense: 180 capsule; Refill: 3 - TSH; Future  3. Hyperlipidemia associated with type 1 diabetes mellitus (HCC) - atorvastatin (LIPITOR) 20 MG tablet; Take 1 tablet (20 mg total) by mouth daily at 6 PM. (Needs to be seen before next refill)  Dispense: 30 tablet; Refill: 0 - CBC with Differential/Platelet; Future - CMP14+EGFR; Future - Lipid panel; Future  4. Chronic left-sided low back pain with left-sided sciatica - traMADol (ULTRAM) 50  MG tablet; Take 1 tablet (50 mg total) by mouth every 6 (six) hours as needed.  Dispense: 120 tablet; Refill: 2 - cyclobenzaprine (FLEXERIL) 10 MG tablet; Take 1 tablet (10 mg total) by mouth 3 (three) times daily as needed for muscle spasms.  Dispense: 90 tablet; Refill: 5  5. Bipolar affective disorder, currently depressed, moderate (HCC) - FLUoxetine (PROZAC) 20 MG capsule; Take 3 capsules (60 mg total) by mouth daily.  Dispense: 90 capsule; Refill: 0 - OLANZapine (ZYPREXA) 10 MG tablet; Take 1 tablet (10 mg total) by mouth at bedtime.  Dispense: 90 tablet; Refill: 0  6. Diabetes mellitus type 1, uncontrolled, insulin dependent (HCC) - lisinopril (ZESTRIL) 5 MG tablet; Take 1 tablet (5 mg total) by mouth daily.  Dispense: 90 tablet; Refill: 3 - exenatide (BYETTA 5 MCG PEN) 5 MCG/0.02ML SOPN injection; INJECT 5MCG SUBCUTANEOUSLY TWICE DAILY  Dispense: 8.4 mL; Refill: 5 - CBC with Differential/Platelet; Future - CMP14+EGFR; Future - Lipid panel; Future - Microalbumin / creatinine urine ratio; Future - TSH; Future - Bayer DCA Hb A1c Waived; Future  7. Chronic obstructive pulmonary disease, unspecified COPD type ([place]) - SPIRIVA HANDIHALER 18 MCG inhalation capsule; INHALE ONE PUFF BY MOUTH DAILY.  Dispense: 30 capsule; Refill: 12    Start time:11:33 AM End time: 11:50 AM  Meds ordered this encounter  Medications  . gabapentin (NEURONTIN) 100 MG capsule    Sig: Take 1-3 capsules (100-300 mg total) by mouth 2 (two) times daily. Increase by one tablet each week    Dispense:  180 capsule    Refill:  3    Order Specific Question:   Supervising Provider    Answer:   [name] [[id number]]  . atorvastatin (LIPITOR) 20 MG tablet    Sig: Take 1 tablet (20 mg total) by mouth daily at 6 PM. (Needs to be seen before next refill)    Dispense:  30 tablet    Refill:  0    Order Specific Question:   Supervising Provider    Answer:   [name] [[id number]]  . traMADol (ULTRAM) 50 MG tablet    Sig: Take 1 tablet (50 mg total) by mouth every 6 (six) hours as needed.    Dispense:  120 tablet    Refill:  2    Order Specific Question:   Supervising Provider    Answer:   [name] [[id number]]  . spironolactone (ALDACTONE) 50 MG tablet    Sig: Take 1 tablet (50 mg total) by mouth every morning.    Dispense:  90 tablet    Refill:  1    Order Specific Question:   Supervising Provider    Answer:   [name] [[id number]]  . DISCONTD: cyclobenzaprine (FLEXERIL) 10 MG tablet    Sig: Take 1 tablet (10 mg total) by mouth 3 (three) times daily as needed for muscle spasms.    Dispense:  90 tablet    Refill:  5    Order Specific Question:   Supervising Provider    Answer:   [name] [[id number]]  . FLUoxetine (PROZAC) 20 MG capsule    Sig: Take 3 capsules (60 mg total) by mouth daily.    Dispense:  90 capsule    Refill:  0    Order Specific Question:   Supervising Provider    Answer:   [name] [[id number]]  . furosemide (LASIX) 40 MG tablet    Sig: Take 1 tablet (40 mg total) by  mouth 2 (two) times daily.    Dispense:  60 tablet    Refill:  11    Order Specific Question:   Supervising Provider    Answer:   [name] [[id number]]  . lisinopril (ZESTRIL) 5 MG tablet    Sig: Take  1 tablet (5 mg total) by mouth daily.    Dispense:  90 tablet    Refill:  3    Order Specific Question:   Supervising Provider    Answer:   [name] [[id number]]  . OLANZapine (ZYPREXA) 10 MG tablet    Sig: Take 1 tablet (10 mg total) by mouth at bedtime.    Dispense:  90 tablet    Refill:  0    Order Specific Question:   Supervising Provider    Answer:   [name] [[id number]]  . budesonide-formoterol (SYMBICORT) 160-4.5 MCG/ACT inhaler    Sig: Inhale 2 puffs into the lungs 2 (two) times daily.    Dispense:  10.2 g    Refill:  11    Order Specific Question:   Supervising Provider    Answer:   [name] [[id number]]  . SPIRIVA HANDIHALER 18 MCG inhalation capsule    Sig: INHALE ONE PUFF BY MOUTH DAILY.    Dispense:  30 capsule    Refill:  12    Order Specific Question:   Supervising Provider    Answer:   [name] [[id number]]  . exenatide (BYETTA 5 MCG PEN) 5 MCG/0.02ML SOPN injection    Sig: INJECT 5MCG SUBCUTANEOUSLY TWICE DAILY    Dispense:  8.4 mL    Refill:  5    Order Specific Question:   Supervising Provider    Answer:   [name] [[id number]]  . cyclobenzaprine (FLEXERIL) 10 MG tablet    Sig: Take 1 tablet (10 mg total) by mouth 3 (three) times daily as needed for muscle spasms.    Dispense:  90 tablet    Refill:  5    Order Specific Question:   Supervising Provider    Answer:   [name] [[id number]]    Particia Nearing PA-C [place] [phone]

## 2019-01-14 ENCOUNTER — Other Ambulatory Visit: Payer: Self-pay | Admitting: Physician Assistant

## 2019-01-14 DIAGNOSIS — E785 Hyperlipidemia, unspecified: Principal | ICD-10-CM

## 2019-01-14 DIAGNOSIS — E1169 Type 2 diabetes mellitus with other specified complication: Secondary | ICD-10-CM

## 2019-01-23 ENCOUNTER — Other Ambulatory Visit: Payer: Self-pay | Admitting: Physician Assistant

## 2019-01-23 DIAGNOSIS — E1165 Type 2 diabetes mellitus with hyperglycemia: Secondary | ICD-10-CM

## 2019-02-11 ENCOUNTER — Other Ambulatory Visit: Payer: Self-pay | Admitting: Physician Assistant

## 2019-02-11 DIAGNOSIS — E1165 Type 2 diabetes mellitus with hyperglycemia: Secondary | ICD-10-CM

## 2019-03-06 ENCOUNTER — Ambulatory Visit (INDEPENDENT_AMBULATORY_CARE_PROVIDER_SITE_OTHER): Payer: Medicare Other | Admitting: Family Medicine

## 2019-03-06 ENCOUNTER — Other Ambulatory Visit: Payer: Self-pay

## 2019-03-06 VITALS — BP 129/77 | HR 96 | Temp 98.0°F

## 2019-03-06 DIAGNOSIS — E1165 Type 2 diabetes mellitus with hyperglycemia: Secondary | ICD-10-CM

## 2019-03-06 LAB — BAYER DCA HB A1C WAIVED: HB A1C (BAYER DCA - WAIVED): 13.9 % — ABNORMAL HIGH (ref <7.0)

## 2019-03-06 MED ORDER — LANTUS SOLOSTAR 100 UNIT/ML ~~LOC~~ SOPN: 120.0000 [IU] | PEN_INJECTOR | Freq: Every day | SUBCUTANEOUS | 99 refills | Status: DC

## 2019-03-06 MED ORDER — SITAGLIPTIN PHOSPHATE 100 MG PO TABS: 100.0000 mg | ORAL_TABLET | Freq: Every day | ORAL | 0 refills | Status: DC

## 2019-03-06 NOTE — Progress Notes (Signed)
 Subjective: CC: hyperglycemia PCP: Jones, Angel PA-C HPI:Aimee Phillips is a 47 y.o. female presenting to clinic today for:  1. Type 2 Diabetes w/ hyperglycemia:  Patient presents to the office today with her family member and notes that she has been having blood sugars that have been quite elevated over the last several days.  She reports a maximum blood pressure of greater than 500.  She is had blood pressures that were "undetectable".  Today her blood sugar was 325.  She is compliant with Lantus 120 units daily and 30 units of Humalog with meals.  She is had no change in her diet recently.  She was previously treated with metformin but she had severe diarrhea and therefore it was discontinued.  She also was treated with Invokana in the past but again had issues with yeast infections and therefore this was discontinued.  She saw an endocrinologist once but they moved from the area and she is never seen another one since.  She would be very interested in seeing one however.  Limiting factor may be transportation.  She goes on to state that since she has been having these very high blood sugars she has been feeling quite fatigued, weak.  No chest pain or shortness of breath outside of normal.  Last eye exam: needs Last foot exam: needs Last A1c:  Lab Results  Component Value Date   HGBA1C 10.8 (H) 05/15/2018   Nephropathy screen indicated?: needs PNA vaccine Last flu, zoster and/or pneumovax:  Immunization History  Administered Date(s) Administered  . Influenza Inj Mdck Quad Pf 08/31/2018  . Influenza Split 08/11/2018  . Tdap 02/14/2011   2.  Disability secondary to chronic back pain Patient is likely disabled secondary to chronic back pain.  She is on tramadol 50 mg every 6 hours but she does not find that this is especially helpful.  She would be amenable to seeing a back doctor.  She has a history of vertebral fracture in the past.  She is also on gabapentin and diclofenac.  She  goes on to note that she is predominantly wheelchair bound.  She is able to go to the bathroom independently but often is sedentary.  She is able to do self grooming and self-care but does not often perform chores or other duties that require her to stand.  ROS: Per HPI  No Known Allergies Past Medical History:  Diagnosis Date  . Asthma   . Bipolar affect, depressed (HCC)   . Cellulitis   . COPD (chronic obstructive pulmonary disease) (HCC)   . Depression   . Diabetes mellitus without complication (HCC)   . PE (pulmonary embolism)   . Pulmonary edema     Current Outpatient Medications:  .  albuterol (PROVENTIL HFA;VENTOLIN HFA) 108 (90 Base) MCG/ACT inhaler, Inhale 2 puffs into the lungs every 6 (six) hours as needed for wheezing or shortness of breath., Disp: 1 Inhaler, Rfl: 0 .  atorvastatin (LIPITOR) 20 MG tablet, TAKE ONE TABLET BY MOUTH DAILY AT 6 PM., Disp: 30 tablet, Rfl: 2 .  blood glucose meter kit and supplies, Check BS up to four times a day, Disp: 1 each, Rfl: 0 .  Blood Glucose Monitoring Suppl DEVI, Check bs up to four times a day, Disp: 1 each, Rfl: 0 .  budesonide-formoterol (SYMBICORT) 160-4.5 MCG/ACT inhaler, Inhale 2 puffs into the lungs 2 (two) times daily., Disp: 10.2 g, Rfl: 11 .  cyclobenzaprine (FLEXERIL) 10 MG tablet, Take 1 tablet (10 mg total)   by mouth 3 (three) times daily as needed for muscle spasms., Disp: 90 tablet, Rfl: 5 .  diclofenac (CATAFLAM) 50 MG tablet, Take 1 tablet (50 mg total) by mouth 2 (two) times daily., Disp: 60 tablet, Rfl: 2 .  exenatide (BYETTA 5 MCG PEN) 5 MCG/0.02ML SOPN injection, INJECT 5MCG SUBCUTANEOUSLY TWICE DAILY, Disp: 8.4 mL, Rfl: 5 .  fluconazole (DIFLUCAN) 150 MG tablet, Take 1 tablet (150 mg total) by mouth once a week., Disp: 4 tablet, Rfl: 11 .  FLUoxetine (PROZAC) 20 MG capsule, Take 3 capsules (60 mg total) by mouth daily., Disp: 90 capsule, Rfl: 0 .  furosemide (LASIX) 40 MG tablet, Take 1 tablet (40 mg total) by mouth 2  (two) times daily., Disp: 60 tablet, Rfl: 11 .  gabapentin (NEURONTIN) 100 MG capsule, Take 1-3 capsules (100-300 mg total) by mouth 2 (two) times daily. Increase by one tablet each week, Disp: 180 capsule, Rfl: 3 .  glucose blood (ACCU-CHEK AVIVA) test strip, Use to check blood sugar four times daily, Disp: 200 each, Rfl: 12 .  Insulin Glargine (LANTUS SOLOSTAR) 100 UNIT/ML Solostar Pen, Inject 50-100 Units into the skin 1 day or 1 dose for 1 dose., Disp: 10 pen, Rfl: PRN .  Insulin Lispro (HUMALOG KWIKPEN) 200 UNIT/ML SOPN, Inject 10-30 Units into the skin 2 (two) times daily before a meal., Disp: 3 mL, Rfl: 5 .  Insulin Pen Needle 31G X 8 MM MISC, 1 Units by Does not apply route 4 (four) times daily., Disp: 100 each, Rfl: 2 .  ipratropium-albuterol (DUONEB) 0.5-2.5 (3) MG/3ML SOLN, Take 3 mLs by nebulization every 6 (six) hours as needed., Disp: , Rfl:  .  Lancet Devices MISC, Test QID, DX E11.65, Aviva Plus, Disp: 100 each, Rfl: prn .  lisinopril (ZESTRIL) 5 MG tablet, Take 1 tablet (5 mg total) by mouth daily., Disp: 90 tablet, Rfl: 3 .  mirtazapine (REMERON) 45 MG tablet, Take 1 tablet (45 mg total) by mouth at bedtime., Disp: 30 tablet, Rfl: 11 .  OLANZapine (ZYPREXA) 10 MG tablet, Take 1 tablet (10 mg total) by mouth at bedtime., Disp: 90 tablet, Rfl: 0 .  omeprazole (PRILOSEC) 20 MG capsule, TAKE ONE CAPSULE BY MOUTH ONCE DAILY. (MORNING), Disp: 90 capsule, Rfl: 3 .  potassium chloride (K-DUR) 10 MEQ tablet, TAKE ONE TABLET BY MOUTH TWICE DAILY., Disp: 60 tablet, Rfl: 2 .  solifenacin (VESICARE) 10 MG tablet, Take 1 tablet (10 mg total) by mouth daily., Disp: 30 tablet, Rfl: 11 .  SPIRIVA HANDIHALER 18 MCG inhalation capsule, INHALE ONE PUFF BY MOUTH DAILY., Disp: 30 capsule, Rfl: 12 .  spironolactone (ALDACTONE) 50 MG tablet, Take 1 tablet (50 mg total) by mouth every morning., Disp: 90 tablet, Rfl: 1 .  traMADol (ULTRAM) 50 MG tablet, Take 1 tablet (50 mg total) by mouth every 6 (six)  hours as needed., Disp: 120 tablet, Rfl: 2 Social History   Socioeconomic History  . Marital status: Married    Spouse name: Not on file  . Number of children: Not on file  . Years of education: Not on file  . Highest education level: Not on file  Occupational History  . Not on file  Social Needs  . Financial resource strain: Not on file  . Food insecurity    Worry: Not on file    Inability: Not on file  . Transportation needs    Medical: Not on file    Non-medical: Not on file  Tobacco Use  . Smoking   status: Never Smoker  . Smokeless tobacco: Never Used  Substance and Sexual Activity  . Alcohol use: No  . Drug use: No  . Sexual activity: Not Currently  Lifestyle  . Physical activity    Days per week: Not on file    Minutes per session: Not on file  . Stress: Not on file  Relationships  . Social Herbalist on phone: Not on file    Gets together: Not on file    Attends religious service: Not on file    Active member of club or organization: Not on file    Attends meetings of clubs or organizations: Not on file    Relationship status: Not on file  . Intimate partner violence    Fear of current or ex partner: Not on file    Emotionally abused: Not on file    Physically abused: Not on file    Forced sexual activity: Not on file  Other Topics Concern  . Not on file  Social History Narrative  . Not on file   Family History  Problem Relation Age of Onset  . Cancer Mother        breast  . Heart failure Father        died of AMI    Objective: Office vital signs reviewed. BP 129/77   Pulse 96   Temp 98 F (36.7 C)   Physical Examination:  General: Awake, alert, morbidly obese, No acute distress HEENT: Normal, sclera white, MMM. No diaphoresis. Cardio: regular rate and rhythm, S1S2 heard, no murmurs appreciated Pulm: clear to auscultation bilaterally, no wheezes, rhonchi or rales; normal work of breathing on room air Extremities: warm, well  perfused, nonpitting edema noted of LEs. No cyanosis or clubbing; +2 pulses bilaterally MSK: arrives in wheelchair.  Assessment/ Plan: 48 y.o. female   1. Type 2 diabetes mellitus with hyperglycemia, without long-term current use of insulin (HCC) Uncontrolled.  A1c of 13.9 today.  Unfortunately, this patient is very complicated had many concerns today.  I have gone ahead and placed a referral to endocrinology to see if they may be of assistance in this very pleasant but complicated patient.  In the interim, have added Januvia 100 mg daily.  She is to continue insulin at current doses.  I want her to keep a very close eye on her blood sugars.  She is to monitor this every morning, and 2 hours after each meal.  She will bring this glucose log in 2 weeks and we will adjust insulins as needed.  Patient aware of red flag signs and symptoms warranting emergent evaluation. - Bayer DCA Hb A1c Waived - CMP14+EGFR - Lipid Panel - sitaGLIPtin (JANUVIA) 100 MG tablet; Take 1 tablet (100 mg total) by mouth daily.  Dispense: 90 tablet; Refill: 0 - Insulin Glargine (LANTUS SOLOSTAR) 100 UNIT/ML Solostar Pen; Inject 120 Units into the skin daily for 1 dose.  Dispense: 10 pen; Refill: PRN - Ambulatory referral to Endocrinology   Orders Placed This Encounter  Procedures  . Bayer DCA Hb A1c Waived  . CMP14+EGFR  . Lipid Panel  . Ambulatory referral to Endocrinology    Referral Priority:   Routine    Referral Type:   Consultation    Referral Reason:   Specialty Services Required    Number of Visits Requested:   1   Meds ordered this encounter  Medications  . sitaGLIPtin (JANUVIA) 100 MG tablet    Sig: Take 1 tablet (100  mg total) by mouth daily.    Dispense:  90 tablet    Refill:  0  . Insulin Glargine (LANTUS SOLOSTAR) 100 UNIT/ML Solostar Pen    Sig: Inject 120 Units into the skin daily for 1 dose.    Dispense:  10 pen    Refill:  PRN      M , DO [facility] [phone]   

## 2019-03-06 NOTE — Patient Instructions (Signed)
I have added Januvia to your regimen.  Continue your current doses of insulin for now.  I'd like to see you in 2 weeks for sugar check.  Bring in your sugar log so we can review and change your insulin at that time if needed.

## 2019-03-07 ENCOUNTER — Telehealth: Payer: Self-pay | Admitting: Family Medicine

## 2019-03-07 LAB — CMP14+EGFR
ALT: 32 IU/L (ref 0–32)
AST: 47 IU/L — ABNORMAL HIGH (ref 0–40)
Albumin/Globulin Ratio: 1.4 (ref 1.2–2.2)
Albumin: 4.1 g/dL (ref 3.8–4.8)
Alkaline Phosphatase: 87 IU/L (ref 39–117)
BUN/Creatinine Ratio: 8 — ABNORMAL LOW (ref 9–23)
BUN: 6 mg/dL (ref 6–24)
Bilirubin Total: <0.2 mg/dL (ref 0.0–1.2)
CO2: 26 mmol/L (ref 20–29)
Calcium: 9 mg/dL (ref 8.7–10.2)
Chloride: 94 mmol/L — ABNORMAL LOW (ref 96–106)
Creatinine, Ser: 0.73 mg/dL (ref 0.57–1.00)
GFR calc Af Amer: 113 mL/min/{1.73_m2} (ref >59)
GFR calc non Af Amer: 98 mL/min/{1.73_m2} (ref >59)
Globulin, Total: 3 g/dL (ref 1.5–4.5)
Glucose: 392 mg/dL — ABNORMAL HIGH (ref 65–99)
Potassium: 4.5 mmol/L (ref 3.5–5.2)
Sodium: 137 mmol/L (ref 134–144)
Total Protein: 7.1 g/dL (ref 6.0–8.5)

## 2019-03-07 LAB — LIPID PANEL
Chol/HDL Ratio: 4.8 ratio — ABNORMAL HIGH (ref 0.0–4.4)
Cholesterol, Total: 174 mg/dL (ref 100–199)
HDL: 36 mg/dL — ABNORMAL LOW (ref >39)
LDL Calculated: 110 mg/dL — ABNORMAL HIGH (ref 0–99)
Triglycerides: 142 mg/dL (ref 0–149)
VLDL Cholesterol Cal: 28 mg/dL (ref 5–40)

## 2019-03-07 NOTE — Telephone Encounter (Signed)
Pt aware of labs  

## 2019-03-20 ENCOUNTER — Other Ambulatory Visit: Payer: Self-pay

## 2019-03-21 ENCOUNTER — Encounter: Payer: Self-pay | Admitting: Family Medicine

## 2019-03-21 ENCOUNTER — Ambulatory Visit (INDEPENDENT_AMBULATORY_CARE_PROVIDER_SITE_OTHER): Payer: Medicare Other | Admitting: Family Medicine

## 2019-03-21 VITALS — BP 113/75 | HR 95 | Temp 99.1°F | Ht 66.0 in | Wt 347.0 lb

## 2019-03-21 DIAGNOSIS — R1011 Right upper quadrant pain: Secondary | ICD-10-CM

## 2019-03-21 DIAGNOSIS — Z794 Long term (current) use of insulin: Secondary | ICD-10-CM

## 2019-03-21 DIAGNOSIS — F3132 Bipolar disorder, current episode depressed, moderate: Secondary | ICD-10-CM

## 2019-03-21 DIAGNOSIS — E1165 Type 2 diabetes mellitus with hyperglycemia: Secondary | ICD-10-CM | POA: Diagnosis not present

## 2019-03-21 DIAGNOSIS — M545 Low back pain: Secondary | ICD-10-CM

## 2019-03-21 DIAGNOSIS — S22080K Wedge compression fracture of T11-T12 vertebra, subsequent encounter for fracture with nonunion: Secondary | ICD-10-CM

## 2019-03-21 DIAGNOSIS — E662 Morbid (severe) obesity with alveolar hypoventilation: Secondary | ICD-10-CM

## 2019-03-21 DIAGNOSIS — G8929 Other chronic pain: Secondary | ICD-10-CM

## 2019-03-21 NOTE — Progress Notes (Signed)
Subjective: CC: hyperglycemia PCP: Particia Nearing PA-C [id number] Aimee Phillips is a 48 y.o. female presenting to clinic today for:  1. Type 2 Diabetes w/ hyperglycemia:  Patient presents the office today for 2-week follow-up of blood sugar.  At last visit she was started on Januvia 100 mg daily in addition to the medication she was already taking as she was having elevated blood sugars into the 4 and 500s.  She brings a two-week log and notes most blood sugars around 2-3 100s with one isolated postprandial blood sugar in the 500s.  She is had no significant side effects from the Januvia.  She continues to feel poorly.  She does have a visit to see the endocrinologist coming up in about 2 weeks.  Last eye exam: needs Last foot exam: needs Last A1c:  Lab Results  Component Value Date   HGBA1C 13.9 (H) 03/06/2019   Nephropathy screen indicated?: needs PNA vaccine Last flu, zoster and/or pneumovax:  Immunization History  Administered Date(s) Administered   Influenza Inj Mdck Quad Pf 08/31/2018   Influenza Split 08/11/2018   Tdap 02/14/2011   2.  Disability secondary to chronic back pain She would like to get a handicap placard.  She will bring these records in.  At baseline she is unable to ambulate much on her own.  She does occasionally use the walker.  Today she arrives in a wheelchair.  She would like to go ahead and see an orthopedist for her back pain.  3.  Obstructive sleep apnea/obesity hypoventilation syndrome Patient notes that she was scheduled to see a sleep medicine doctor in [place] but has no transportation to [place].  She is asking for something closer like [place] if possible.  She does report frequent nighttime awakening.  She was placed on both Zyprexa and Remeron for mood disorder in hopes it would also help with sleep but notes that this does not really help her sleep well.  She identifies chronic pain and anxiety as primary reasons why she is unable to sleep  well.  She goes on to state that she was given a Xanax by a friend before and this seemed to help.  Of note she is also treated with tramadol for chronic pain.  She does not feel that her depression anxiety symptoms are well controlled with 60 mg of Prozac, 10 mg of Zyprexa and 45 mg of Remeron.  Her spouse who is with her today is asking if we can back down on some of these medications as she is on quite a bit lately.  4.  Abdominal pain Patient reports several month history of right upper quadrant abdominal pain.  Denies any associated nausea, vomiting, skin discoloration.  She does report that she had a gallbladder removed a while back so she does not think that this is gallbladder etiology.  ROS: Per HPI  No Known Allergies Past Medical History:  Diagnosis Date   Asthma    Bipolar affect, depressed ([place])    Cellulitis    COPD (chronic obstructive pulmonary disease) ([place])    Depression    Diabetes mellitus without complication (HCC)    PE (pulmonary embolism)    Pulmonary edema     Current Outpatient Medications:    albuterol (PROVENTIL HFA;VENTOLIN HFA) 108 (90 Base) MCG/ACT inhaler, Inhale 2 puffs into the lungs every 6 (six) hours as needed for wheezing or shortness of breath., Disp: 1 Inhaler, Rfl: 0   atorvastatin (LIPITOR) 20 MG tablet, TAKE ONE TABLET BY MOUTH  DAILY AT 6 PM., Disp: 30 tablet, Rfl: 2   blood glucose meter kit and supplies, Check BS up to four times a day, Disp: 1 each, Rfl: 0   Blood Glucose Monitoring Suppl DEVI, Check bs up to four times a day, Disp: 1 each, Rfl: 0   budesonide-formoterol (SYMBICORT) 160-4.5 MCG/ACT inhaler, Inhale 2 puffs into the lungs 2 (two) times daily., Disp: 10.2 g, Rfl: 11   cyclobenzaprine (FLEXERIL) 10 MG tablet, Take 1 tablet (10 mg total) by mouth 3 (three) times daily as needed for muscle spasms., Disp: 90 tablet, Rfl: 5   diclofenac (CATAFLAM) 50 MG tablet, Take 1 tablet (50 mg total) by mouth 2 (two) times daily.,  Disp: 60 tablet, Rfl: 2   FLUoxetine (PROZAC) 20 MG capsule, Take 3 capsules (60 mg total) by mouth daily., Disp: 90 capsule, Rfl: 0   furosemide (LASIX) 40 MG tablet, Take 1 tablet (40 mg total) by mouth 2 (two) times daily., Disp: 60 tablet, Rfl: 11   gabapentin (NEURONTIN) 100 MG capsule, Take 1-3 capsules (100-300 mg total) by mouth 2 (two) times daily. Increase by one tablet each week, Disp: 180 capsule, Rfl: 3   glucose blood (ACCU-CHEK AVIVA) test strip, Use to check blood sugar four times daily, Disp: 200 each, Rfl: 12   Insulin Lispro (HUMALOG KWIKPEN) 200 UNIT/ML SOPN, Inject 10-30 Units into the skin 2 (two) times daily before a meal., Disp: 3 mL, Rfl: 5   Insulin Pen Needle 31G X 8 MM MISC, 1 Units by Does not apply route 4 (four) times daily., Disp: 100 each, Rfl: 2   Lancet Devices MISC, Test QID, DX E11.65, Aviva Plus, Disp: 100 each, Rfl: prn   lisinopril (ZESTRIL) 5 MG tablet, Take 1 tablet (5 mg total) by mouth daily., Disp: 90 tablet, Rfl: 3   mirtazapine (REMERON) 45 MG tablet, Take 1 tablet (45 mg total) by mouth at bedtime., Disp: 30 tablet, Rfl: 11   OLANZapine (ZYPREXA) 10 MG tablet, Take 1 tablet (10 mg total) by mouth at bedtime., Disp: 90 tablet, Rfl: 0   omeprazole (PRILOSEC) 20 MG capsule, TAKE ONE CAPSULE BY MOUTH ONCE DAILY. (MORNING), Disp: 90 capsule, Rfl: 3   potassium chloride (K-DUR) 10 MEQ tablet, TAKE ONE TABLET BY MOUTH TWICE DAILY., Disp: 60 tablet, Rfl: 2   sitaGLIPtin (JANUVIA) 100 MG tablet, Take 1 tablet (100 mg total) by mouth daily., Disp: 90 tablet, Rfl: 0   solifenacin (VESICARE) 10 MG tablet, Take 1 tablet (10 mg total) by mouth daily., Disp: 30 tablet, Rfl: 11   SPIRIVA HANDIHALER 18 MCG inhalation capsule, INHALE ONE PUFF BY MOUTH DAILY., Disp: 30 capsule, Rfl: 12   spironolactone (ALDACTONE) 50 MG tablet, Take 1 tablet (50 mg total) by mouth every morning., Disp: 90 tablet, Rfl: 1   traMADol (ULTRAM) 50 MG tablet, Take 1 tablet (50  mg total) by mouth every 6 (six) hours as needed., Disp: 120 tablet, Rfl: 2   Insulin Glargine (LANTUS SOLOSTAR) 100 UNIT/ML Solostar Pen, Inject 120 Units into the skin daily for 1 dose., Disp: 10 pen, Rfl: PRN Social History   Socioeconomic History   Marital status: Married    Spouse name: Not on file   Number of children: Not on file   Years of education: Not on file   Highest education level: Not on file  Occupational History   Not on file  Social Needs   Financial resource strain: Not on file   Food insecurity  Worry: Not on file    Inability: Not on file   Transportation needs    Medical: Not on file    Non-medical: Not on file  Tobacco Use   Smoking status: Never Smoker   Smokeless tobacco: Never Used  Substance and Sexual Activity   Alcohol use: No   Drug use: No   Sexual activity: Not Currently  Lifestyle   Physical activity    Days per week: Not on file    Minutes per session: Not on file   Stress: Not on file  Relationships   Social connections    Talks on phone: Not on file    Gets together: Not on file    Attends religious service: Not on file    Active member of club or organization: Not on file    Attends meetings of clubs or organizations: Not on file    Relationship status: Not on file   Intimate partner violence    Fear of current or ex partner: Not on file    Emotionally abused: Not on file    Physically abused: Not on file    Forced sexual activity: Not on file  Other Topics Concern   Not on file  Social History Narrative   Not on file   Family History  Problem Relation Age of Onset   Cancer Mother        breast   Heart failure Father        died of AMI    Objective: Office vital signs reviewed. BP 113/75    Pulse 95    Temp 99.1 F (37.3 C) (Oral)    Ht '5\' 6"'$  (1.676 m)    Wt (!) 347 lb (157.4 kg)    BMI 56.01 kg/m   Physical Examination:  General: Awake, alert, morbidly obese, No acute distress HEENT:  Normal, sclera white, MMM.  Cardio: regular rate and rhythm, S1S2 heard, no murmurs appreciated Pulm: clear to auscultation bilaterally, no wheezes, rhonchi or rales; normal work of breathing on room air Extremities: warm, well perfused, nonpitting edema noted of LEs. No cyanosis or clubbing; +2 pulses bilaterally MSK: arrives in wheelchair. Psych: Mood stable, speech normal, affect appropriate, does not appear to be responding to internal stimuli.  Depression screen [facility] 2/9 03/21/2019 03/06/2019 05/15/2018 03/11/2018 03/05/2018  Decreased Interest '2 2 1 1 1  '$ Down, Depressed, Hopeless '3 3 1 1 1  '$ PHQ - 2 Score '5 5 2 2 2  '$ Altered sleeping '3 3 2 2 2  '$ Tired, decreased energy '3 3 2 2 2  '$ Change in appetite 0 - 0 0 0  Feeling bad or failure about yourself  2 0 '1 1 1  '$ Trouble concentrating 0 '1 1 1 1  '$ Moving slowly or fidgety/restless 0 0 0 1 0  Suicidal thoughts 1 1 0 0 0  PHQ-9 Score '14 13 8 9 8  '$ Difficult doing work/chores - Somewhat difficult - - -  Some recent data might be hidden    Assessment/ Plan: 48 y.o. female   1. Type 2 diabetes mellitus with hyperglycemia, with long-term current use of insulin (HCC) Blood sugars remain elevated but are improving.  Since she has a follow-up visit with endocrinology in the next 2 weeks, continue current regimen including Januvia.  I suspect they will modify her insulin regimen.  We discussed that need for weight loss is really important, particularly given constellation of uncontrolled blood sugars, chronic back pain and obstructive sleep apnea.  She seems  contemplative.  2. 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  She has ongoing chronic low back pain and therefore I placed referral to orthopedics.  I wonder if she would be a good candidate for injection therapy.  We again reinforced weight loss would be essential in improving this pain. - Ambulatory referral to Orthopedic Surgery  3. Chronic bilateral low back  pain, unspecified whether sciatica present - Ambulatory referral to Orthopedic Surgery  4. Morbid obesity ([place])  5. Bipolar affective disorder, currently depressed, moderate ([place]) I have placed a referral to psychiatry to help with medications as this patient is on multiple medications and not seeing improvement in mood.  Again I think that morbid obesity and uncontrolled sleep apnea are probably contributing to mood disorder.  I am going to go ahead and taper off of the Remeron as she identifies this as a medication that has not really helped.  We discussed taper and this was reinforced in the AVS. - Ambulatory referral to Psychiatry  6. Obesity hypoventilation syndrome ([place]) I have referred her to sleep studies in [place]. - Ambulatory referral to Sleep Studies  7. RUQ pain Uncertain etiology.  Exam was limited by obesity.  No peritoneal signs.  She is tender over the liver.  [name] ordered to further evaluate - US Abdomen Limited RUQ; Future   No orders of the defined types were placed in this encounter.  No orders of the defined types were placed in this encounter.    [name], DO [place] [phone]

## 2019-03-21 NOTE — Patient Instructions (Addendum)
For the mirtazapine taper follow these directions:   Week 1 and 2: take 1/2 tablet every night at bedtime.  Week 3 and 4 take 1/4 tablet every night at beditme  Week 5 and 6: Take 1/4 tablet every other night. Then stop.  Keep checking your blood sugar 4 times daily and bring this to Dr Liliane Channel appointment.  This will help qualify you for the [facility].

## 2019-03-24 ENCOUNTER — Other Ambulatory Visit: Payer: Self-pay | Admitting: Physician Assistant

## 2019-03-27 ENCOUNTER — Ambulatory Visit: Payer: Self-pay

## 2019-03-27 ENCOUNTER — Ambulatory Visit (INDEPENDENT_AMBULATORY_CARE_PROVIDER_SITE_OTHER): Payer: Medicare Other | Admitting: Orthopaedic Surgery

## 2019-03-27 ENCOUNTER — Encounter: Payer: Self-pay | Admitting: Orthopaedic Surgery

## 2019-03-27 ENCOUNTER — Other Ambulatory Visit: Payer: Self-pay

## 2019-03-27 VITALS — BP 116/75 | HR 98 | Ht 70.0 in | Wt 350.0 lb

## 2019-03-27 DIAGNOSIS — M545 Low back pain, unspecified: Secondary | ICD-10-CM

## 2019-03-27 DIAGNOSIS — G8929 Other chronic pain: Secondary | ICD-10-CM

## 2019-03-27 NOTE — Progress Notes (Signed)
Office Visit Note   Patient: Aimee Phillips           Date of Birth: 1971/09/11           MRN: [id number] Visit Date: 03/27/2019              Requested by: Raliegh IpGottschalk, Ashly M, DO [address] [place],  [place] [id number] PCP: Raliegh IpGottschalk, Ashly M, DO   Assessment & Plan: Visit Diagnoses:  1. Chronic midline low back pain without sciatica   2.    Previous T12 compression fracture   Plan: We discussed working on weight loss to help with her diabetes, blood pressure, chronic low back pain.  Previous CT scan was reviewed with patient as well as current and previous lumbar imaging from 2019 and 2018.  She does not have any neurogenic claudication symptoms no changes consistent with cauda equina.  If she develops radicular symptoms or claudication she can return.  Follow-Up Instructions: No follow-ups on file.   Orders:  Orders Placed This Encounter  Procedures   XR Lumbar Spine 2-3 Views   No orders of the defined types were placed in this encounter.     Procedures: No procedures performed   Clinical Data: No additional findings.   Subjective: Chief Complaint  Patient presents with   Lower Back - Pain    Pain x 3 years since fracture     HPI 48 year old female referred by Dr. Nadine CountsGottschalk for low back pain.  Patient states she [facility] 3 [facility] ago [facility] but did not have follow-up.  States she had a lumbar spine fracture 3 years ago with the fall.  She has low back pain she rates it at 5 out of 10 at times it can be as bad as 10 out of 10 and sometimes is in her left leg.  She is used tramadol but states she does not get relief.  CT of the abdomen 09/19/2017 showed ovarian cyst, hepatic steatosis, chronic nonunion compression fracture T12 with 40% loss of height.  Soft tissue component within the fracture vertebral body rises the possibility of a pathologic fracture per radiology report.  Lumbar radiographs 11/23/2016 showed similar compression with maintenance of  posterior height at T12.  Review of Systems 14 point review of systems positive for morbid obesity with BMI 50.,  Bipolar disorder, depression, history of T12 compression fracture, ovarian cyst, COPD, type 2 diabetes, neuropathy otherwise negative is a pertains HPI.  Previous surgeries include C-section x3.  Gallbladder surgery.  Non-smoker nondrinker.  Objective: Vital Signs: BP 116/75    Pulse 98    Ht 5\' 10"  (1.778 m)    Wt (!) 350 lb (158.8 kg)    BMI 50.22 kg/m   Physical Exam Constitutional:      Appearance: She is well-developed.  HENT:     Head: Normocephalic.     Right Ear: External ear normal.     Left Ear: External ear normal.  Eyes:     Pupils: Pupils are equal, round, and reactive to light.  Neck:     Thyroid: No thyromegaly.     Trachea: No tracheal deviation.  Cardiovascular:     Rate and Rhythm: Normal rate.  Pulmonary:     Effort: Pulmonary effort is normal.  Abdominal:     Palpations: Abdomen is soft.     Comments: trunkal obesity  Skin:    General: Skin is warm and dry.  Neurological:     Mental  Status: She is alert and oriented to person, place, and time.  Psychiatric:        Behavior: Behavior normal.     Ortho Exam patient gets from sitting to standing.  Anterior tib gastrocsoleus is intact.  Negative logroll to the hips knee reach full extension mild crepitus.  No venous stasis changes.  No plantar foot ulcers.  Distal pulses are palpable.  Knee and ankle jerk right and left are symmetrical. Specialty Comments:  No specialty comments available.  Imaging: No results found.   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   Chronic obstructive pulmonary disease ([place]) 03/05/2018   Cyst of left ovary  01/18/2018   Pelvic pain 01/18/2018   Chronic right-sided low back pain 04/20/2017   At high risk for falls 04/20/2017   Muscle weakness 04/20/2017   Injury of back 02/02/2017   Chronic left-sided low back pain with left-sided sciatica 02/02/2017   Deformity of toenail 02/02/2017   Hyperlipidemia associated with type 2 diabetes mellitus ([place]) 02/02/2017   Well adult exam 02/02/2017   Morbid obesity ([place]) 08/01/2011   Obesity hypoventilation syndrome ([place]) 08/01/2011   Bipolar affect, depressed ([place])    Past Medical History:  Diagnosis Date   Asthma    Bipolar affect, depressed ([place])    Cellulitis    COPD (chronic obstructive pulmonary disease) ([place])    Depression    Diabetes mellitus without complication ([place])    PE (pulmonary embolism)    Pulmonary edema     Family History  Problem Relation Age of Onset   Cancer Mother        breast   Heart failure Father        died of AMI    Past Surgical History:  Procedure Laterality Date   CESAREAN SECTION     CHOLECYSTECTOMY     Social History   Occupational History   Not on file  Tobacco Use   Smoking status: Never Smoker   Smokeless tobacco: Never Used  Substance and Sexual Activity   Alcohol use: No   Drug use: No   Sexual activity: Not Currently

## 2019-04-01 ENCOUNTER — Ambulatory Visit (HOSPITAL_COMMUNITY): Payer: Medicare Other

## 2019-04-02 ENCOUNTER — Ambulatory Visit (INDEPENDENT_AMBULATORY_CARE_PROVIDER_SITE_OTHER): Payer: Medicare Other

## 2019-04-02 ENCOUNTER — Other Ambulatory Visit: Payer: Self-pay

## 2019-04-02 DIAGNOSIS — Z0000 Encounter for general adult medical examination without abnormal findings: Secondary | ICD-10-CM

## 2019-04-02 NOTE — Progress Notes (Signed)
MEDICARE ANNUAL WELLNESS VISIT  04/02/2019  Telephone Visit Disclaimer This Medicare AWV was conducted by telephone due to national recommendations for restrictions regarding the COVID-19 Pandemic (e.g. social distancing).  I verified, using two identifiers, that I am speaking with [name] or their authorized healthcare agent. I discussed the limitations, risks, security, and privacy concerns of performing an evaluation and management service by telephone and the potential availability of an in-person appointment in the future. The patient expressed understanding and agreed to proceed.   Subjective:  LEVEDA KENDRIX is a 48 y.o. female patient of [name], DO who had a Medicare Annual Wellness Visit today via telephone. Oval is Disabled and lives with their family. she has 3 children. she reports that she is socially active and does interact with friends/family regularly. she is minimally physically active and enjoys her grandchildren.  Patient Care Team: [name], DO as PCP - General (Family Medicine)  Advanced Directives 04/02/2019 08/02/2011  Does Patient Have a Medical Advance Directive? No Patient does not have advance directive  Would patient like information on creating a medical advance directive? No - Patient declined -  Pre-existing out of facility DNR order (yellow form or pink MOST form) - No    Hospital Utilization Over the Past 12 Months: # of hospitalizations or ER visits: 0 # of surgeries: 0  Review of Systems    Patient reports that her overall health is unchanged compared to last year.  Patient Reported Readings (BP, Pulse, CBG, Weight, etc) none  Review of Systems: History obtained from chart review  All other systems negative.  Pain Assessment Pain : 0-10 Pain Score: 5  Pain Location: Back Pain Descriptors / Indicators: Constant Pain Onset: More than a month ago Pain Frequency: Constant Pain Relieving Factors: Ultram  Effect of Pain on Daily Activities: Slows patient down  Pain Relieving Factors: Ultram  Current Medications & Allergies (verified) Allergies as of 04/02/2019   No Known Allergies     Medication List       Accurate as of April 02, 2019  3:37 PM. If you have any questions, ask your nurse or doctor.        albuterol 108 (90 Base) MCG/ACT inhaler Commonly known as: VENTOLIN HFA Inhale 2 puffs into the lungs every 6 (six) hours as needed for wheezing or shortness of breath.   atorvastatin 20 MG tablet Commonly known as: LIPITOR TAKE ONE TABLET BY MOUTH DAILY AT 6 PM.   blood glucose meter kit and supplies Check BS up to four times a day   Blood Glucose Monitoring Suppl Devi Check bs up to four times a day   budesonide-formoterol 160-4.5 MCG/ACT inhaler Commonly known as: Symbicort Inhale 2 puffs into the lungs 2 (two) times daily.   cyclobenzaprine 10 MG tablet Commonly known as: FLEXERIL Take 1 tablet (10 mg total) by mouth 3 (three) times daily as needed for muscle spasms.   diclofenac 50 MG tablet Commonly known as: CATAFLAM Take 1 tablet (50 mg total) by mouth 2 (two) times daily.   FLUoxetine 20 MG capsule Commonly known as: PROZAC Take 3 capsules (60 mg total) by mouth daily.   furosemide 40 MG tablet Commonly known as: LASIX Take 1 tablet (40 mg total) by mouth 2 (two) times daily.   gabapentin 100 MG capsule Commonly known as: NEURONTIN Take 1-3 capsules (100-300 mg total) by mouth 2 (two) times daily. Increase by one tablet each week   glucose  blood test strip Commonly known as: Accu-Chek Aviva Use to check blood sugar four times daily   Insulin Lispro 200 UNIT/ML Sopn Commonly known as: HumaLOG KwikPen Inject 10-30 Units into the skin 2 (two) times daily before a meal.   Insulin Pen Needle 31G X 8 MM Misc 1 Units by Does not apply route 4 (four) times daily.   Lancet Devices Misc Test QID, DX E11.65, Aviva Plus   Lantus SoloStar 100 UNIT/ML  Solostar Pen Generic drug: Insulin Glargine Inject 120 Units into the skin daily for 1 dose.   lisinopril 5 MG tablet Commonly known as: ZESTRIL Take 1 tablet (5 mg total) by mouth daily.   mirtazapine 45 MG tablet Commonly known as: REMERON Take 1 tablet (45 mg total) by mouth at bedtime.   OLANZapine 10 MG tablet Commonly known as: ZYPREXA Take 1 tablet (10 mg total) by mouth at bedtime.   omeprazole 20 MG capsule Commonly known as: PRILOSEC TAKE ONE CAPSULE BY MOUTH ONCE DAILY. (MORNING)   potassium chloride 10 MEQ tablet Commonly known as: K-DUR TAKE ONE TABLET BY MOUTH TWICE DAILY.   sitaGLIPtin 100 MG tablet Commonly known as: Januvia Take 1 tablet (100 mg total) by mouth daily.   solifenacin 10 MG tablet Commonly known as: VESIcare Take 1 tablet (10 mg total) by mouth daily.   Spiriva HandiHaler 18 MCG inhalation capsule Generic drug: tiotropium INHALE ONE PUFF BY MOUTH DAILY.   spironolactone 50 MG tablet Commonly known as: ALDACTONE Take 1 tablet (50 mg total) by mouth every morning.   traMADol 50 MG tablet Commonly known as: ULTRAM Take 1 tablet (50 mg total) by mouth every 6 (six) hours as needed.       History (reviewed): Past Medical History:  Diagnosis Date  . Asthma   . Bipolar affect, depressed ([place])   . Cellulitis   . COPD (chronic obstructive pulmonary disease) ([place])   . Depression   . Diabetes mellitus without complication ([place])   . PE (pulmonary embolism)   . Pulmonary edema    Past Surgical History:  Procedure Laterality Date  . CESAREAN SECTION    . CHOLECYSTECTOMY     Family History  Problem Relation Age of Onset  . Cancer Mother        breast  . Heart failure Father        died of AMI  . Cancer Sister   . Diabetes Brother   . Heart disease Sister    Social History   Socioeconomic History  . Marital status: Married    Spouse name: Not on file  . Number of children: 3  . Years of education: Not on file  . Highest  education level: High school graduate  Occupational History  . Occupation: Disabled  Social Needs  . Financial resource strain: Not hard at all  . Food insecurity    Worry: Never true    Inability: Never true  . Transportation needs    Medical: No    Non-medical: No  Tobacco Use  . Smoking status: Never Smoker  . Smokeless tobacco: Never Used  Substance and Sexual Activity  . Alcohol use: No  . Drug use: No  . Sexual activity: Not Currently  Lifestyle  . Physical activity    Days per week: 0 days    Minutes per session: 0 min  . Stress: Not at all  Relationships  . Social connections    Talks on phone: More than three times a week  Gets together: More than three times a week    Attends religious service: Not on file    Active member of club or organization: Not on file    Attends meetings of clubs or organizations: Not on file    Relationship status: Not on file  Other Topics Concern  . Not on file  Social History Narrative  . Not on file    Activities of Daily Living In your present state of health, do you have any difficulty performing the following activities: 04/02/2019  Hearing? N  Vision? Y  Difficulty concentrating or making decisions? N  Walking or climbing stairs? Y  Dressing or bathing? N  Doing errands, shopping? N  Preparing Food and eating ? N  Using the Toilet? N  In the past six months, have you accidently leaked urine? N  Do you have problems with loss of bowel control? N  Managing your Medications? N  Managing your Finances? N  Housekeeping or managing your Housekeeping? N  Some recent data might be hidden    Patient Education/ Literacy How often do you need to have someone help you when you read instructions, pamphlets, or other written materials from your doctor or pharmacy?: 1 - Never What is the last grade level you completed in school?: 12th grade  Exercise Current Exercise Habits: The patient does not participate in regular exercise  at present, Exercise limited by: orthopedic condition(s)  Diet Patient reports consuming 3 meals a day and 2 snack(s) a day Patient reports that her primary diet is: Regular Patient reports that she does have regular access to food.   Depression Screen PHQ 2/9 Scores 03/21/2019 03/06/2019 05/15/2018 03/11/2018 03/05/2018 01/18/2018 08/30/2017  PHQ - 2 Score '5 5 2 2 2 1 2  '$ PHQ- 9 Score '14 13 8 9 8 '$ - 6     Fall Risk Fall Risk  08/30/2017 06/01/2017  Falls in the past year? Yes Yes  Number falls in past yr: 1 2 or more  Injury with Fall? Yes Yes  Comment - Back injury.     Objective:  [name] seemed alert and oriented and she participated appropriately during our telephone visit.  Blood Pressure Weight BMI  BP Readings from Last 3 Encounters:  03/27/19 116/75  03/21/19 113/75  03/06/19 129/77   Wt Readings from Last 3 Encounters:  03/27/19 (!) 350 lb (158.8 kg)  03/21/19 (!) 347 lb (157.4 kg)  06/20/18 (!) 375 lb (170.1 kg)   BMI Readings from Last 1 Encounters:  03/27/19 50.22 kg/m    *Unable to obtain current vital signs, weight, and BMI due to telephone visit type  Hearing/Vision  . Onyinyechi did not seem to have difficulty with hearing/understanding during the telephone conversation . Reports that she has had a formal eye exam by an eye care professional within the past year . Reports that she has not had a formal hearing evaluation within the past year *Unable to fully assess hearing and vision during telephone visit type  Cognitive Function: 6CIT Screen 04/02/2019  What Year? 0 points  What month? 0 points  What time? 0 points  Count back from 20 0 points  Months in reverse 0 points  Repeat phrase 0 points  Total Score 0   (Normal:0-7, Significant for Dysfunction: >8)  Normal Cognitive Function Screening: Yes   Immunization & Health Maintenance Record Immunization History  Administered Date(s) Administered  . Influenza Inj Mdck Quad Pf 08/31/2018  .  Influenza Split 08/11/2018  .  Tdap 02/14/2011    Health Maintenance  Topic Date Due  . PNEUMOCOCCAL POLYSACCHARIDE VACCINE AGE 500-64 HIGH RISK  09/05/1973  . OPHTHALMOLOGY EXAM  09/05/1981  . FOOT EXAM  03/06/2019  . INFLUENZA VACCINE  04/12/2019  . HEMOGLOBIN A1C  09/05/2019  . PAP SMEAR-Modifier  10/22/2020  . TETANUS/TDAP  02/13/2021  . HIV Screening  Completed       Assessment  This is a routine wellness examination for QUINLYN TEP.  Health Maintenance: Due or Overdue Health Maintenance Due  Topic Date Due  . PNEUMOCOCCAL POLYSACCHARIDE VACCINE AGE 500-64 HIGH RISK  09/05/1973  . OPHTHALMOLOGY EXAM  09/05/1981  . FOOT EXAM  03/06/2019    [name] does not need a referral for Community Assistance: Care Management:   no Social Work:    no Prescription Assistance:  no Nutrition/Diabetes Education:  no   Plan:  Personalized Goals Goals Addressed            This Visit's Progress   . Exercise 150 min/wk Moderate Activity      . Have 3 meals a day        Personalized Health Maintenance & Screening Recommendations  Discuss pneumonia vaccines  Lung Cancer Screening Recommended: no (Low Dose CT Chest recommended if Age 70-80 years, 30 pack-year currently smoking OR have quit w/in past 15 years) Hepatitis C Screening recommended: no HIV Screening recommended: no  Completed on 08/02/11  Advanced Directives: Written information was not prepared per patient's request.  Referrals & Orders No orders of the defined types were placed in this encounter.   Follow-up Plan . Follow-up with [name], DO as planned    I have personally reviewed and noted the following in the patient's chart:   . Medical and social history . Use of alcohol, tobacco or illicit drugs  . Current medications and supplements . Functional ability and status . Nutritional status . Physical activity . Advanced directives . List of other physicians . Hospitalizations,  surgeries, and ER visits in previous 12 months . Vitals . Screenings to include cognitive, depression, and falls . Referrals and appointments  In addition, I have reviewed and discussed with [name] certain preventive protocols, quality metrics, and best practice recommendations. A written personalized care plan for preventive services as well as general preventive health recommendations is available and can be mailed to the patient at her request.      Rolena Infante LPN 05/26/568

## 2019-04-10 ENCOUNTER — Ambulatory Visit: Payer: Medicare Other | Admitting: "Endocrinology

## 2019-04-23 ENCOUNTER — Ambulatory Visit: Payer: Medicare Other | Admitting: "Endocrinology

## 2019-04-30 ENCOUNTER — Other Ambulatory Visit: Payer: Self-pay | Admitting: Physician Assistant

## 2019-04-30 DIAGNOSIS — F3132 Bipolar disorder, current episode depressed, moderate: Secondary | ICD-10-CM

## 2019-05-27 ENCOUNTER — Ambulatory Visit: Payer: Medicare Other | Admitting: "Endocrinology

## 2019-05-27 ENCOUNTER — Encounter: Payer: Self-pay | Admitting: "Endocrinology

## 2019-06-10 ENCOUNTER — Other Ambulatory Visit: Payer: Self-pay | Admitting: Physician Assistant

## 2019-06-10 DIAGNOSIS — M2550 Pain in unspecified joint: Secondary | ICD-10-CM

## 2019-06-16 ENCOUNTER — Other Ambulatory Visit: Payer: Self-pay | Admitting: Family Medicine

## 2019-06-16 ENCOUNTER — Telehealth (INDEPENDENT_AMBULATORY_CARE_PROVIDER_SITE_OTHER): Payer: Medicare Other | Admitting: Family Medicine

## 2019-06-16 DIAGNOSIS — K047 Periapical abscess without sinus: Secondary | ICD-10-CM

## 2019-06-16 MED ORDER — IBUPROFEN 800 MG PO TABS: 800.0000 mg | ORAL_TABLET | Freq: Three times a day (TID) | ORAL | 1 refills | Status: DC | PRN

## 2019-06-16 NOTE — Patient Instructions (Signed)
Dental Abscess  A dental abscess is an area of pus in or around a tooth. It comes from an infection. It can cause pain and other symptoms. Treatment will help with symptoms and prevent the infection from spreading. Follow these instructions at home: Medicines  Take over-the-counter and prescription medicines only as told by your dentist.  If you were prescribed an antibiotic medicine, take it as told by your dentist. Do not stop taking it even if you start to feel better.  If you were prescribed a gel that has numbing medicine in it, use it exactly as told.  Do not drive or use heavy machinery (like a lawn mower) while taking prescription pain medicine. General instructions  Rinse out your mouth often with salt water. ? To make salt water, dissolve -1 tsp of salt in 1 cup of warm water.  Eat a soft diet while your mouth is healing.  Drink enough fluid to keep your urine pale yellow.  Do not apply heat to the outside of your mouth.  Do not use any products that contain nicotine or tobacco. These include cigarettes and e-cigarettes. If you need help quitting, ask your doctor.  Keep all follow-up visits as told by your dentist. This is important. Prevent an abscess  Brush your teeth every morning and every night. Use fluoride toothpaste.  Floss your teeth each day.  Get dental cleanings as often as told by your dentist.  Think about getting dental sealant put on teeth that have deep holes (decay).  Drink water that has fluoride in it. ? Most tap water has fluoride. ? Check the label on bottled water to see if it has fluoride in it.  Drink water instead of sugary drinks.  Eat healthy meals and snacks.  Wear a mouth guard or face shield when you play sports. Contact a doctor if:  Your pain is worse, and medicine does not help. Get help right away if:  You have a fever or chills.  Your symptoms suddenly get worse.  You have a very bad headache.  You have problems  breathing or swallowing.  You have trouble opening your mouth.  You have swelling in your neck or close to your eye. Summary  A dental abscess is an area of pus in or around a tooth. It is caused by an infection.  Treatment will help with symptoms and prevent the infection from spreading.  Take over-the-counter and prescription medicines only as told by your dentist.  To prevent an abscess, take good care of your teeth. Brush your teeth every morning and night. Use floss every day.  Get dental cleanings as often as told by your dentist. This information is not intended to replace advice given to you by your health care provider. Make sure you discuss any questions you have with your health care provider. Document Released: 01/12/2015 Document Revised: 12/18/2018 Document Reviewed: 04/30/2017 Elsevier Patient Education  2020 Else[employer] Inc[employer]  

## 2019-06-16 NOTE — Progress Notes (Unsigned)
Telephone visit  Subjective: CC: Hospital follow-up for dental abscess PCP: Zaiah Credeur M, DO HPI:Aimee Phillips is a 48 y.o. female calls for telephone consult today. Patient provides verbal consent for consult held via phone.  Location of patient: Car Location of provider: Working remotely from home Others present for call: Family member  1.  Dental abscess Patient reports that she was hospitalized for a left-sided lower dental abscess.  She had tachycardia and there was concern for possible spread.  She notes that the tachycardia was more related to pain rather than bacteremia.  She was treated with antibiotics in the hospital discharged with antibiotics outpatient.  She is completed the antibiotics and is nearing the end of her ibuprofen.  The ibuprofen has done a fairly good job at controlling facial pain and her chronic low back pain.  She notes that she was not told that she should not take the diclofenac as well and has been taking both of them.  She also goes on to state that nobody told her to take the ibuprofen separately from the SSRI.  She denies any fevers but does still have some pain with opening her mouth fully.  She has been eating and drinking.  Though she does note that the pain is worse with eating.  Denies any purulence or fevers.  The facial swelling has gone down quite a bit since her hospitalization.  She has follow-up with dentistry on 06/24/2019 to have the tooth removed.   ROS: Per HPI  No Known Allergies     Past Medical History:  Diagnosis Date  . Asthma   . Bipolar affect, depressed ([facility])   . Cellulitis   . COPD (chronic obstructive pulmonary disease) ([facility])   . Depression   . Diabetes mellitus without complication ([facility])   . PE (pulmonary embolism)   . Pulmonary edema     Current Outpatient Medications:  .  albuterol (PROVENTIL HFA;VENTOLIN HFA) 108 (90 Base) MCG/ACT inhaler, Inhale 2 puffs into the lungs every 6 (six) hours as needed  for wheezing or shortness of breath., Disp: 1 Inhaler, Rfl: 0 .  atorvastatin (LIPITOR) 20 MG tablet, TAKE ONE TABLET BY MOUTH DAILY AT 6 PM., Disp: 30 tablet, Rfl: 2 .  blood glucose meter kit and supplies, Check BS up to four times a day, Disp: 1 each, Rfl: 0 .  Blood Glucose Monitoring Suppl DEVI, Check bs up to four times a day, Disp: 1 each, Rfl: 0 .  budesonide-formoterol (SYMBICORT) 160-4.5 MCG/ACT inhaler, Inhale 2 puffs into the lungs 2 (two) times daily., Disp: 10.2 g, Rfl: 11 .  cyclobenzaprine (FLEXERIL) 10 MG tablet, Take 1 tablet (10 mg total) by mouth 3 (three) times daily as needed for muscle spasms., Disp: 90 tablet, Rfl: 5 .  FLUoxetine (PROZAC) 20 MG capsule, TAKE THREE (3) CAPSULES BY MOUTH DAILY., Disp: 90 capsule, Rfl: 0 .  furosemide (LASIX) 40 MG tablet, Take 1 tablet (40 mg total) by mouth 2 (two) times daily., Disp: 60 tablet, Rfl: 11 .  gabapentin (NEURONTIN) 100 MG capsule, Take 1-3 capsules (100-300 mg total) by mouth 2 (two) times daily. Increase by one tablet each week, Disp: 180 capsule, Rfl: 3 .  glucose blood (ACCU-CHEK AVIVA) test strip, Use to check blood sugar four times daily, Disp: 200 each, Rfl: 12 .  ibuprofen (ADVIL) 800 MG tablet, Take 1 tablet (800 mg total) by mouth every 8 (eight) hours as needed for moderate pain (dental or back pain)., Disp: 90 tablet, Rfl:   1 .  Insulin Glargine (LANTUS SOLOSTAR) 100 UNIT/ML Solostar Pen, Inject 120 Units into the skin daily for 1 dose., Disp: 10 pen, Rfl: PRN .  Insulin Lispro (HUMALOG KWIKPEN) 200 UNIT/ML SOPN, Inject 10-30 Units into the skin 2 (two) times daily before a meal., Disp: 3 mL, Rfl: 5 .  Insulin Pen Needle 31G X 8 MM MISC, 1 Units by Does not apply route 4 (four) times daily., Disp: 100 each, Rfl: 2 .  Lancet Devices MISC, Test QID, DX E11.65, Aviva Plus, Disp: 100 each, Rfl: prn .  lisinopril (ZESTRIL) 5 MG tablet, Take 1 tablet (5 mg total) by mouth daily., Disp: 90 tablet, Rfl: 3 .  OLANZapine  (ZYPREXA) 10 MG tablet, Take 1 tablet (10 mg total) by mouth at bedtime., Disp: 90 tablet, Rfl: 0 .  omeprazole (PRILOSEC) 20 MG capsule, TAKE ONE CAPSULE BY MOUTH ONCE DAILY. (MORNING), Disp: 90 capsule, Rfl: 3 .  potassium chloride (K-DUR) 10 MEQ tablet, TAKE ONE TABLET BY MOUTH TWICE DAILY., Disp: 60 tablet, Rfl: 2 .  sitaGLIPtin (JANUVIA) 100 MG tablet, Take 1 tablet (100 mg total) by mouth daily., Disp: 90 tablet, Rfl: 0 .  solifenacin (VESICARE) 10 MG tablet, Take 1 tablet (10 mg total) by mouth daily., Disp: 30 tablet, Rfl: 11 .  SPIRIVA HANDIHALER 18 MCG inhalation capsule, INHALE ONE PUFF BY MOUTH DAILY., Disp: 30 capsule, Rfl: 12 .  spironolactone (ALDACTONE) 50 MG tablet, Take 1 tablet (50 mg total) by mouth every morning., Disp: 90 tablet, Rfl: 1 .  traMADol (ULTRAM) 50 MG tablet, Take 1 tablet (50 mg total) by mouth every 6 (six) hours as needed., Disp: 120 tablet, Rfl: 2  Assessment/ Plan: 48 y.o. female   1. Dental abscess Status post hospitalization and outpatient treatment with antibiotics.  Continue oral NSAID until she is evaluated by dentistry.  We discussed discontinuation of diclofenac if going to use ibuprofen, which she felt was more effective.  I also advised her that the NSAID plus SSRI increases risk of GI bleed.  She is to continue PPI and monitor for signs and symptoms of GI bleed.  She will take the medication separately by at least 4 hours.  We discussed red flag signs and symptoms warranting evaluation again emergency department.  She voiced good understanding. - ibuprofen (ADVIL) 800 MG tablet; Take 1 tablet (800 mg total) by mouth every 8 (eight) hours as needed for moderate pain (dental or back pain).  Dispense: 90 tablet; Refill: 1  Additionally, she had a telephone number change and never made the appointment for sleep study.  I will see if their office can reach out to her again with this new phone number for an appointment.  Start time: 10:17am End time:  10:27am  Total time spent on patient care (including telephone call/ virtual visit): 18 minutes  Dayami Taitt M Satoru Milich, DO [facility] [phone]  

## 2019-06-16 NOTE — Progress Notes (Signed)
Telephone visit  Subjective: CC: Hospital follow-up for dental abscess PCP: Aimee Norlander, DO [name] is a 48 y.o. female calls for telephone consult today. Patient provides verbal consent for consult held via phone.  Location of patient: [place], [place] of provider: Working remotely from home Others present for call: Family member  1.  Dental abscess Patient reports that she was hospitalized for a left-sided lower dental abscess.  She had tachycardia and there was concern for possible spread.  She notes that the tachycardia was more related to pain rather than bacteremia.  She was treated with antibiotics in the hospital discharged with antibiotics outpatient.  She is completed the antibiotics and is nearing the end of her ibuprofen.  The ibuprofen has done a fairly good job at controlling facial pain and her chronic low back pain.  She notes that she was not told that she should not take the diclofenac as well and has been taking both of them.  She also goes on to state that nobody told her to take the ibuprofen separately from the SSRI.  She denies any fevers but does still have some pain with opening her mouth fully.  She has been eating and drinking.  Though she does note that the pain is worse with eating.  Denies any purulence or fevers.  The facial swelling has gone down quite a bit since her hospitalization.  She has follow-up with dentistry on 06/24/2019 to have the tooth removed.   ROS: Per HPI  No Known Allergies     Past Medical History:  Diagnosis Date  . Asthma   . Bipolar affect, depressed ([place])   . Cellulitis   . COPD (chronic obstructive pulmonary disease) ([place])   . Depression   . Diabetes mellitus without complication ([place])   . PE (pulmonary embolism)   . Pulmonary edema     Current Outpatient Medications:  .  albuterol (PROVENTIL HFA;VENTOLIN HFA) 108 (90 Base) MCG/ACT inhaler, Inhale 2 puffs into the lungs every 6 (six) hours as needed  for wheezing or shortness of breath., Disp: 1 Inhaler, Rfl: 0 .  atorvastatin (LIPITOR) 20 MG tablet, TAKE ONE TABLET BY MOUTH DAILY AT 6 PM., Disp: 30 tablet, Rfl: 2 .  blood glucose meter kit and supplies, Check BS up to four times a day, Disp: 1 each, Rfl: 0 .  Blood Glucose Monitoring Suppl DEVI, Check bs up to four times a day, Disp: 1 each, Rfl: 0 .  budesonide-formoterol (SYMBICORT) 160-4.5 MCG/ACT inhaler, Inhale 2 puffs into the lungs 2 (two) times daily., Disp: 10.2 g, Rfl: 11 .  cyclobenzaprine (FLEXERIL) 10 MG tablet, Take 1 tablet (10 mg total) by mouth 3 (three) times daily as needed for muscle spasms., Disp: 90 tablet, Rfl: 5 .  FLUoxetine (PROZAC) 20 MG capsule, TAKE THREE (3) CAPSULES BY MOUTH DAILY., Disp: 90 capsule, Rfl: 0 .  furosemide (LASIX) 40 MG tablet, Take 1 tablet (40 mg total) by mouth 2 (two) times daily., Disp: 60 tablet, Rfl: 11 .  gabapentin (NEURONTIN) 100 MG capsule, Take 1-3 capsules (100-300 mg total) by mouth 2 (two) times daily. Increase by one tablet each week, Disp: 180 capsule, Rfl: 3 .  glucose blood (ACCU-CHEK AVIVA) test strip, Use to check blood sugar four times daily, Disp: 200 each, Rfl: 12 .  ibuprofen (ADVIL) 800 MG tablet, Take 1 tablet (800 mg total) by mouth every 8 (eight) hours as needed for moderate pain (dental or back pain)., Disp: 90 tablet, Rfl:  1 .  Insulin Glargine (LANTUS SOLOSTAR) 100 UNIT/ML Solostar Pen, Inject 120 Units into the skin daily for 1 dose., Disp: 10 pen, Rfl: PRN .  Insulin Lispro (HUMALOG KWIKPEN) 200 UNIT/ML SOPN, Inject 10-30 Units into the skin 2 (two) times daily before a meal., Disp: 3 mL, Rfl: 5 .  Insulin Pen Needle 31G X 8 MM MISC, 1 Units by Does not apply route 4 (four) times daily., Disp: 100 each, Rfl: 2 .  Lancet Devices MISC, Test QID, DX E11.65, Aviva Plus, Disp: 100 each, Rfl: prn .  lisinopril (ZESTRIL) 5 MG tablet, Take 1 tablet (5 mg total) by mouth daily., Disp: 90 tablet, Rfl: 3 .  OLANZapine  (ZYPREXA) 10 MG tablet, Take 1 tablet (10 mg total) by mouth at bedtime., Disp: 90 tablet, Rfl: 0 .  omeprazole (PRILOSEC) 20 MG capsule, TAKE ONE CAPSULE BY MOUTH ONCE DAILY. (MORNING), Disp: 90 capsule, Rfl: 3 .  potassium chloride (K-DUR) 10 MEQ tablet, TAKE ONE TABLET BY MOUTH TWICE DAILY., Disp: 60 tablet, Rfl: 2 .  sitaGLIPtin (JANUVIA) 100 MG tablet, Take 1 tablet (100 mg total) by mouth daily., Disp: 90 tablet, Rfl: 0 .  solifenacin (VESICARE) 10 MG tablet, Take 1 tablet (10 mg total) by mouth daily., Disp: 30 tablet, Rfl: 11 .  SPIRIVA HANDIHALER 18 MCG inhalation capsule, INHALE ONE PUFF BY MOUTH DAILY., Disp: 30 capsule, Rfl: 12 .  spironolactone (ALDACTONE) 50 MG tablet, Take 1 tablet (50 mg total) by mouth every morning., Disp: 90 tablet, Rfl: 1 .  traMADol (ULTRAM) 50 MG tablet, Take 1 tablet (50 mg total) by mouth every 6 (six) hours as needed., Disp: 120 tablet, Rfl: 2  Assessment/ Plan: 48 y.o. female   1. Dental abscess Status post hospitalization and outpatient treatment with antibiotics.  Continue oral NSAID until she is evaluated by dentistry.  We discussed discontinuation of diclofenac if going to use ibuprofen, which she felt was more effective.  I also advised her that the NSAID plus SSRI increases risk of GI bleed.  She is to continue PPI and monitor for signs and symptoms of GI bleed.  She will take the medication separately by at least 4 hours.  We discussed red flag signs and symptoms warranting evaluation again emergency department.  She voiced good understanding. - ibuprofen (ADVIL) 800 MG tablet; Take 1 tablet (800 mg total) by mouth every 8 (eight) hours as needed for moderate pain (dental or back pain).  Dispense: 90 tablet; Refill: 1  Additionally, she had a telephone number change and never made the appointment for sleep study.  I will see if their office can reach out to her again with this new phone number for an appointment.  Start time: 10:17am End time:  10:27am  Total time spent on patient care (including telephone call/ virtual visit): 18 minutes  [place], [place] [phone]

## 2019-06-17 NOTE — Progress Notes (Signed)
Hey, I tried to call the number you listed and I can not get that one to work either - The Ref is still open so she can call [place] and schedule an appointment :)

## 2019-06-18 ENCOUNTER — Telehealth: Payer: Self-pay | Admitting: Family Medicine

## 2019-06-18 NOTE — Telephone Encounter (Signed)
Please review and advise.

## 2019-06-23 ENCOUNTER — Other Ambulatory Visit: Payer: Self-pay | Admitting: Physician Assistant

## 2019-06-23 ENCOUNTER — Telehealth: Payer: Self-pay | Admitting: Family Medicine

## 2019-06-23 MED ORDER — FLUCONAZOLE 150 MG PO TABS: 150.0000 mg | ORAL_TABLET | Freq: Once | ORAL | 0 refills | Status: AC

## 2019-06-23 NOTE — Telephone Encounter (Signed)
Please review and advise. Patient of Dr Darnell Level

## 2019-06-23 NOTE — Telephone Encounter (Signed)
Sent diflucan to the pharmacy

## 2019-06-23 NOTE — Progress Notes (Signed)
diflucan 

## 2019-06-23 NOTE — Telephone Encounter (Signed)
Patient aware.

## 2019-06-24 ENCOUNTER — Other Ambulatory Visit: Payer: Self-pay | Admitting: Family Medicine

## 2019-06-24 ENCOUNTER — Other Ambulatory Visit: Payer: Self-pay

## 2019-06-24 DIAGNOSIS — F3132 Bipolar disorder, current episode depressed, moderate: Secondary | ICD-10-CM

## 2019-06-24 NOTE — Telephone Encounter (Signed)
Will address at tomorrow's appt

## 2019-06-25 ENCOUNTER — Encounter: Payer: Self-pay | Admitting: Family Medicine

## 2019-06-25 ENCOUNTER — Ambulatory Visit (INDEPENDENT_AMBULATORY_CARE_PROVIDER_SITE_OTHER): Payer: Medicare Other | Admitting: Family Medicine

## 2019-06-25 VITALS — BP 87/59 | HR 93 | Temp 97.7°F

## 2019-06-25 DIAGNOSIS — G4734 Idiopathic sleep related nonobstructive alveolar hypoventilation: Secondary | ICD-10-CM

## 2019-06-25 DIAGNOSIS — E1165 Type 2 diabetes mellitus with hyperglycemia: Secondary | ICD-10-CM

## 2019-06-25 DIAGNOSIS — B3731 Acute candidiasis of vulva and vagina: Secondary | ICD-10-CM

## 2019-06-25 DIAGNOSIS — Z794 Long term (current) use of insulin: Secondary | ICD-10-CM

## 2019-06-25 DIAGNOSIS — E662 Morbid (severe) obesity with alveolar hypoventilation: Secondary | ICD-10-CM

## 2019-06-25 DIAGNOSIS — B373 Candidiasis of vulva and vagina: Secondary | ICD-10-CM | POA: Diagnosis not present

## 2019-06-25 DIAGNOSIS — K047 Periapical abscess without sinus: Secondary | ICD-10-CM

## 2019-06-25 DIAGNOSIS — I952 Hypotension due to drugs: Secondary | ICD-10-CM

## 2019-06-25 LAB — BAYER DCA HB A1C WAIVED: HB A1C (BAYER DCA - WAIVED): 11.3 % — ABNORMAL HIGH (ref <7.0)

## 2019-06-25 MED ORDER — LANTUS SOLOSTAR 100 UNIT/ML ~~LOC~~ SOPN: 62.0000 [IU] | PEN_INJECTOR | Freq: Two times a day (BID) | SUBCUTANEOUS | 2 refills | Status: DC

## 2019-06-25 MED ORDER — SITAGLIPTIN PHOSPHATE 100 MG PO TABS: 100.0000 mg | ORAL_TABLET | Freq: Every day | ORAL | 0 refills | Status: DC

## 2019-06-25 MED ORDER — FLUCONAZOLE 150 MG PO TABS: 150.0000 mg | ORAL_TABLET | Freq: Once | ORAL | 0 refills | Status: AC

## 2019-06-25 NOTE — Patient Instructions (Signed)
STOP the spironolactone.  Your blood pressure is too low. I have increased your Lantus and split the dose up between the morning and nighttime dose.  Start injecting 62 units twice daily.  Continue the Humalog with meals as we discussed  For the Lantus, you can increase by 1 unit every 2 days IF fasting morning blood sugar is greater than 150.  Example Day 1 BG: 256 Day 2 BG: 250  INCREASE LANTUS: 62 am and 63 units in pm Day 3 BG: 245 Day 4 BG: 230  INCREASE LANTUS: 63 u am and 63 u pm Day 5 BG:222 Day 6 BG: 235  INCREASE LANTUS: 63 u am and 64 u pm

## 2019-06-25 NOTE — Progress Notes (Signed)
Subjective: CC: hyperglycemia PCP: [name], DO Aimee Phillips is a 48 y.o. female, who is accompanied by her son today's visit.  She is presenting to clinic today for:  1. Type 2 Diabetes w/ hyperglycemia:  Patient presents the office today for follow-up of blood sugar.  She no showed to her appt with endocrinology in September and cancelled the 2 appts before that one.  At last visit she was seeing improvement in her blood sugars but still having intermittent spikes in the afternoon.  Fasting blood sugars continue to run into the 200s.  She has had highs into the 400s but no lows.  The lowest that she seen has been 200.  Last eye exam: She had an eye exam with Dr. Truman [place] recently Last foot exam: needs Last A1c:  Lab Results  Component Value Date   HGBA1C 13.9 (H) 03/06/2019   Nephropathy screen indicated?: needs PNA vaccine Last flu, zoster and/or pneumovax:  Immunization History  Administered Date(s) Administered   Influenza Inj Mdck Quad Pf 08/31/2018   Influenza Split 08/11/2018   Tdap 02/14/2011   2.  Disability secondary to chronic back pain Patient has not yet picked up her handicap placard but still needs this.  She also is requesting a hospital bed, she had one previously.  She does require oxygen at nighttime and sometimes finds it very hard to breathe if she is not elevated with several pillows.  She is unsure as to which company she used previously but it may have been either [employer] or [name].  Additionally, she has not yet received a call back with regards to her sleep study.  She would like to go ahead and reschedule this as soon as possible.  She discontinued her diclofenac and is now only taking ibuprofen.  3.  Dental abscess Patient is currently on clindamycin for dental abscess.  She has follow-up at the end of the month with dental surgery for dental work.  She has developed a yeast infection since that time and the symptoms are ongoing  despite use of Diflucan x1.  She reports vaginal itching and discharge.   ROS: Per HPI  No Known Allergies Past Medical History:  Diagnosis Date   Asthma    Bipolar affect, depressed ([place])    Cellulitis    COPD (chronic obstructive pulmonary disease) ([facility])    Depression    Diabetes mellitus without complication ([facility])    PE (pulmonary embolism)    Pulmonary edema     Current Outpatient Medications:    albuterol (PROVENTIL HFA;VENTOLIN HFA) 108 (90 Base) MCG/ACT inhaler, Inhale 2 puffs into the lungs every 6 (six) hours as needed for wheezing or shortness of breath., Disp: 1 Inhaler, Rfl: 0   atorvastatin (LIPITOR) 20 MG tablet, TAKE ONE TABLET BY MOUTH DAILY AT 6 PM., Disp: 30 tablet, Rfl: 2   blood glucose meter kit and supplies, Check BS up to four times a day, Disp: 1 each, Rfl: 0   Blood Glucose Monitoring Suppl DEVI, Check bs up to four times a day, Disp: 1 each, Rfl: 0   budesonide-formoterol (SYMBICORT) 160-4.5 MCG/ACT inhaler, Inhale 2 puffs into the lungs 2 (two) times daily., Disp: 10.2 g, Rfl: 11   cyclobenzaprine (FLEXERIL) 10 MG tablet, Take 1 tablet (10 mg total) by mouth 3 (three) times daily as needed for muscle spasms., Disp: 90 tablet, Rfl: 5   FLUoxetine (PROZAC) 20 MG capsule, TAKE THREE (3) CAPSULES BY MOUTH DAILY., Disp: 90 capsule, Rfl:  0   furosemide (LASIX) 40 MG tablet, Take 1 tablet (40 mg total) by mouth 2 (two) times daily., Disp: 60 tablet, Rfl: 11   gabapentin (NEURONTIN) 100 MG capsule, Take 1-3 capsules (100-300 mg total) by mouth 2 (two) times daily. Increase by one tablet each week, Disp: 180 capsule, Rfl: 3   glucose blood (ACCU-CHEK AVIVA) test strip, Use to check blood sugar four times daily, Disp: 200 each, Rfl: 12   ibuprofen (ADVIL) 800 MG tablet, Take 1 tablet (800 mg total) by mouth every 8 (eight) hours as needed for moderate pain (dental or back pain)., Disp: 90 tablet, Rfl: 1   Insulin Glargine (LANTUS SOLOSTAR) 100  UNIT/ML Solostar Pen, Inject 120 Units into the skin daily for 1 dose., Disp: 10 pen, Rfl: PRN   Insulin Lispro (HUMALOG KWIKPEN) 200 UNIT/ML SOPN, Inject 10-30 Units into the skin 2 (two) times daily before a meal., Disp: 3 mL, Rfl: 5   Insulin Pen Needle 31G X 8 MM MISC, 1 Units by Does not apply route 4 (four) times daily., Disp: 100 each, Rfl: 2   Lancet Devices MISC, Test QID, DX E11.65, Aviva Plus, Disp: 100 each, Rfl: prn   lisinopril (ZESTRIL) 5 MG tablet, Take 1 tablet (5 mg total) by mouth daily., Disp: 90 tablet, Rfl: 3   OLANZapine (ZYPREXA) 10 MG tablet, Take 1 tablet (10 mg total) by mouth at bedtime., Disp: 90 tablet, Rfl: 0   omeprazole (PRILOSEC) 20 MG capsule, TAKE ONE CAPSULE BY MOUTH ONCE DAILY. (MORNING), Disp: 90 capsule, Rfl: 3   potassium chloride (K-DUR) 10 MEQ tablet, TAKE ONE TABLET BY MOUTH TWICE DAILY., Disp: 60 tablet, Rfl: 2   sitaGLIPtin (JANUVIA) 100 MG tablet, Take 1 tablet (100 mg total) by mouth daily., Disp: 90 tablet, Rfl: 0   solifenacin (VESICARE) 10 MG tablet, Take 1 tablet (10 mg total) by mouth daily., Disp: 30 tablet, Rfl: 11   SPIRIVA HANDIHALER 18 MCG inhalation capsule, INHALE ONE PUFF BY MOUTH DAILY., Disp: 30 capsule, Rfl: 12   spironolactone (ALDACTONE) 50 MG tablet, Take 1 tablet (50 mg total) by mouth every morning., Disp: 90 tablet, Rfl: 1   traMADol (ULTRAM) 50 MG tablet, Take 1 tablet (50 mg total) by mouth every 6 (six) hours as needed., Disp: 120 tablet, Rfl: 2 Social History   Socioeconomic History   Marital status: Married    Spouse name: Not on file   Number of children: 3   Years of education: Not on file   Highest education level: High school graduate  Occupational History   Occupation: Disabled  Scientist, product/process development strain: Not hard at all   Food insecurity    Worry: Never true    Inability: Never true   Transportation needs    Medical: No    Non-medical: No  Tobacco Use   Smoking status:  Never Smoker   Smokeless tobacco: Never Used  Substance and Sexual Activity   Alcohol use: No   Drug use: No   Sexual activity: Not Currently  Lifestyle   Physical activity    Days per week: 0 days    Minutes per session: 0 min   Stress: Not at all  Relationships   Social connections    Talks on phone: More than three times a week    Gets together: More than three times a week    Attends religious service: Not on file    Active member of club or organization: Not on  file    Attends meetings of clubs or organizations: Not on file    Relationship status: Not on file   Intimate partner violence    Fear of current or ex partner: No    Emotionally abused: No    Physically abused: No    Forced sexual activity: No  Other Topics Concern   Not on file  Social History Narrative   Not on file   Family History  Problem Relation Age of Onset   Cancer Mother        breast   Heart failure Father        died of AMI   Cancer Sister    Diabetes Brother    Heart disease Sister     Objective: Office vital signs reviewed. BP (!) 87/59    Pulse 93    Temp 97.7 F (36.5 C) (Temporal)    SpO2 93%   Physical Examination:  General: Awake, alert, morbidly obese, No acute distress HEENT: Normal, sclera white, MMM.  She has an area of soft tissue swelling along the posterior inferior aspect of the gumline on the left.  No active drainage appreciated.  No facial swelling noted. Cardio: regular rate and rhythm, S1S2 heard, no murmurs appreciated Pulm: clear to auscultation bilaterally, no wheezes, rhonchi or rales; normal work of breathing on room air Extremities: warm, well perfused, nonpitting edema noted of LEs. No cyanosis or clubbing; +2 pulses bilaterally MSK: arrives in wheelchair. Psych: Mood stable, speech normal, affect appropriate Neuro: see DM foot Diabetic Foot Exam - Simple   Simple Foot Form Diabetic Foot exam was performed with the following findings: Yes  06/25/2019  2:44 PM  Visual Inspection No deformities, no ulcerations, no other skin breakdown bilaterally: Yes Sensation Testing Intact to touch and monofilament testing bilaterally: Yes Pulse Check Posterior Tibialis and Dorsalis pulse intact bilaterally: Yes Comments     Depression screen [facility] 2/9 06/25/2019 03/21/2019 03/06/2019 05/15/2018 03/11/2018  Decreased Interest '2 2 2 1 1  '$ Down, Depressed, Hopeless '1 3 3 1 1  '$ PHQ - 2 Score '3 5 5 2 2  '$ Altered sleeping '1 3 3 2 2  '$ Tired, decreased energy '3 3 3 2 2  '$ Change in appetite 2 0 - 0 0  Feeling bad or failure about yourself  2 2 0 1 1  Trouble concentrating 1 0 '1 1 1  '$ Moving slowly or fidgety/restless 1 0 0 0 1  Suicidal thoughts '1 1 1 '$ 0 0  PHQ-9 Score '14 14 13 8 9  '$ Difficult doing work/chores - - Somewhat difficult - -  Some recent data might be hidden    Assessment/ Plan: 48 y.o. female   1. Type 2 diabetes mellitus with hyperglycemia, with long-term current use of insulin ([facility]) Sugar still uncontrolled but improving from last check.  Her A1c has come down to 11.3 from greater than 13 with the Januvia.  I have advised her to increase her Lantus and have actually divided into a morning and evening dose given the volume she is needing to inject.  I have advised her to increase to 62 units twice daily.  She will monitor her fasting blood sugar and for fasting blood sugars greater than 150 she will increase by 1 unit every 2 days.  Teach back method was used for this during today's visit.  Both she and her son voiced good understanding.  She will continue Humalog at current dose.  I reiterated need to see Dr. Dorris Fetch as I  think that he would have a lot to offer with regards to getting her blood sugars under control.  Pneumococcal vaccination administered.  Will obtain recent eye exam from [place].  Foot exam performed. - Bayer DCA Hb A1c Waived - Insulin Glargine (LANTUS SOLOSTAR) 100 UNIT/ML Solostar Pen; Inject 62-80 Units into the skin 2  (two) times daily.  Dispense: 45 mL; Refill: 2 - sitaGLIPtin (JANUVIA) 100 MG tablet; Take 1 tablet (100 mg total) by mouth daily.  Dispense: 90 tablet; Refill: 0  2. Yeast vaginitis Fluconazole sent - fluconazole (DIFLUCAN) 150 MG tablet; Take 1 tablet (150 mg total) by mouth once for 1 dose. May repeat in 3 days if needed for yeast infection  Dispense: 2 tablet; Refill: 0  3. Morbid obesity ([place]) Lifestyle modification recommended.  4. Nocturnal hypoxia We will follow up on her sleep study.  I will also reach out to our referral coordinator to see if perhaps they can reach out to the patient again to schedule her sleep study, as she has a new number.  Also we will plan to order a hospital bed as she sounds like she is having difficulty breathing comfortably given likely OHS, OSA and underlying nocturnal hypoxemia.  5. Hypotension due to drugs Advised her to discontinue use of spironolactone given hypotension.  Okay to continue other medications.  Her potassium was well within range.  She is on potassium supplementation as well as an ACE inhibitor and so I do not think that the spironolactone is adding much potassium sparing at this point.  From our discussion today she is not using this for PCOS and I do not see a history of heart failure indicating need for this medication.  She is to continue monitoring blood pressures closely at home.  If she is unable to do this I would like to see her in clinic in about 4 weeks for recheck, sooner if concerns arise.  7. Dental abscess Continue clindamycin as prescribed.  Follow-up with orthodontist/dentistry as directed   Orders Placed This Encounter  Procedures   For home use only DME Hospital bed    Order Specific Question:   Length of Need    Answer:   Lifetime    Order Specific Question:   Patient has (list medical condition):    Answer:   nocturnal hypoxia, obstructive sleep apnea, morbid obesity, impairment in mobility    Order Specific  Question:   The above medical condition requires:    Answer:   Patient requires the ability to reposition frequently    Order Specific Question:   Head must be elevated greater than:    Answer:   30 degrees    Order Specific Question:   Bed type    Answer:   Heavy-duty, semi-electric (for patients >350 lbs.)   Bayer DCA Hb A1c Waived   Meds ordered this encounter  Medications   fluconazole (DIFLUCAN) 150 MG tablet    Sig: Take 1 tablet (150 mg total) by mouth once for 1 dose. May repeat in 3 days if needed for yeast infection    Dispense:  2 tablet    Refill:  0   Insulin Glargine (LANTUS SOLOSTAR) 100 UNIT/ML Solostar Pen    Sig: Inject 62-80 Units into the skin 2 (two) times daily.    Dispense:  45 mL    Refill:  2   sitaGLIPtin (JANUVIA) 100 MG tablet    Sig: Take 1 tablet (100 mg total) by mouth daily.    Dispense:  90 tablet    Refill:  [place], DO [place] [phone]

## 2019-06-27 ENCOUNTER — Telehealth: Payer: Self-pay | Admitting: Family Medicine

## 2019-06-27 NOTE — Telephone Encounter (Signed)
Re- printed rc for bed - [facility]

## 2019-06-30 ENCOUNTER — Telehealth: Payer: Self-pay | Admitting: Family Medicine

## 2019-06-30 DIAGNOSIS — E662 Morbid (severe) obesity with alveolar hypoventilation: Secondary | ICD-10-CM

## 2019-06-30 NOTE — Telephone Encounter (Signed)
Patient has decided she doesn't want to go to [facility] and would rather go to [facility]. Per referral dept must place another referral. Referral placed and patient notified

## 2019-07-01 ENCOUNTER — Telehealth: Payer: Self-pay | Admitting: Family Medicine

## 2019-07-01 ENCOUNTER — Other Ambulatory Visit: Payer: Self-pay | Admitting: Family Medicine

## 2019-07-01 DIAGNOSIS — E662 Morbid (severe) obesity with alveolar hypoventilation: Secondary | ICD-10-CM

## 2019-07-01 NOTE — Progress Notes (Signed)
Her new number is in [place] - I spoke with her about the new Pulmonology Ref that was placed - [facility] is where Ref was sent to - Patient is aware :)

## 2019-07-01 NOTE — Telephone Encounter (Signed)
Order put in per [place]

## 2019-07-03 ENCOUNTER — Other Ambulatory Visit (HOSPITAL_COMMUNITY)
Admission: RE | Admit: 2019-07-03 | Discharge: 2019-07-03 | Disposition: A | Discharge status: Home / Self Care | Payer: Medicare Other | Source: Ambulatory Visit | Attending: Neurology | Admitting: Neurology

## 2019-07-03 ENCOUNTER — Other Ambulatory Visit: Payer: Self-pay

## 2019-07-03 DIAGNOSIS — Z01812 Encounter for preprocedural laboratory examination: Secondary | ICD-10-CM | POA: Insufficient documentation

## 2019-07-03 DIAGNOSIS — U071 COVID-19: Secondary | ICD-10-CM | POA: Diagnosis not present

## 2019-07-03 LAB — SARS CORONAVIRUS 2 (TAT 6-24 HRS): SARS Coronavirus 2: POSITIVE — AB

## 2019-07-07 ENCOUNTER — Telehealth: Payer: Self-pay

## 2019-07-07 NOTE — Telephone Encounter (Signed)
[facility] - left message.  

## 2019-07-11 ENCOUNTER — Ambulatory Visit: Payer: Medicare Other | Admitting: Family Medicine

## 2019-07-14 ENCOUNTER — Other Ambulatory Visit: Payer: Self-pay

## 2019-07-14 ENCOUNTER — Encounter: Payer: Self-pay | Admitting: "Endocrinology

## 2019-07-14 ENCOUNTER — Encounter: Payer: Medicare Other | Admitting: "Endocrinology

## 2019-07-14 NOTE — Progress Notes (Signed)
  This encounter was created in error - please disregard.  Not seen by other provider. She lied about her positive COVID-19 test.  Patient was asked to reschedule her visit.

## 2019-07-14 NOTE — Patient Instructions (Signed)
Patient lied about her recent COVID-19 test which was positive.  Not seen by the provider.  She is asked to reschedule her.

## 2019-07-15 ENCOUNTER — Other Ambulatory Visit: Payer: Self-pay | Admitting: Physician Assistant

## 2019-07-15 ENCOUNTER — Other Ambulatory Visit: Payer: Self-pay | Admitting: Obstetrics & Gynecology

## 2019-07-15 DIAGNOSIS — G8929 Other chronic pain: Secondary | ICD-10-CM

## 2019-07-24 ENCOUNTER — Ambulatory Visit (INDEPENDENT_AMBULATORY_CARE_PROVIDER_SITE_OTHER): Payer: Medicare Other | Admitting: Family Medicine

## 2019-07-24 VITALS — BP 135/77

## 2019-07-24 DIAGNOSIS — E662 Morbid (severe) obesity with alveolar hypoventilation: Secondary | ICD-10-CM

## 2019-07-24 DIAGNOSIS — M5442 Lumbago with sciatica, left side: Secondary | ICD-10-CM | POA: Diagnosis not present

## 2019-07-24 DIAGNOSIS — G8929 Other chronic pain: Secondary | ICD-10-CM | POA: Diagnosis not present

## 2019-07-24 DIAGNOSIS — E1165 Type 2 diabetes mellitus with hyperglycemia: Secondary | ICD-10-CM | POA: Diagnosis not present

## 2019-07-24 MED ORDER — TRAMADOL HCL 50 MG PO TABS: 50.0000 mg | ORAL_TABLET | Freq: Two times a day (BID) | ORAL | 2 refills | Status: DC | PRN

## 2019-07-24 NOTE — Progress Notes (Signed)
Telephone visit  Subjective: CC:f/u DM PCP: [name], DO XBD:[id number] Aimee Phillips is a 48 y.o. female calls for telephone consult today. Patient provides verbal consent for consult held via phone.  Location of patient: home Location of provider: [facility] Others present for call: grandchildren  1. DM Patient is injecting 65 units of Lantus twice daily.  Her fasting blood sugar was around 230 this am.  Her night BGs are around 200s as well.  Appointment has been rescheduled for September 23.  Unfortunately, she tested positive for coronavirus after being screened for a sleep study.  The sleep study for [facility] was subsequently canceled and has not yet been rescheduled.  She was to have this done at [facility] with Dr. Merlene Laughter.  She goes on to ask if we can get a referral into Dr. Merlene Laughter for her chronic pain.  She currently takes 1 tablet of tramadol up to twice daily but notes that she often will not take it regularly and waits till she is in severe pain before using it so does not become dependent.  Pain is typically in her low back radiating down to her lower extremity.  She has limited mobility due to pain and obesity.  Does not report any chest pain, shortness of breath.  Her blood pressure has been running normal in the 130s over 70s today.   ROS: Per HPI  No Known Allergies Past Medical History:  Diagnosis Date  . Asthma   . Bipolar affect, depressed ([place])   . Cellulitis   . COPD (chronic obstructive pulmonary disease) ([place])   . Depression   . Diabetes mellitus without complication ([place])   . PE (pulmonary embolism)   . Pulmonary edema     Current Outpatient Medications:  .  albuterol (PROVENTIL HFA;VENTOLIN HFA) 108 (90 Base) MCG/ACT inhaler, Inhale 2 puffs into the lungs every 6 (six) hours as needed for wheezing or shortness of breath., Disp: 1 Inhaler, Rfl: 0 .  atorvastatin (LIPITOR) 20 MG tablet, TAKE ONE TABLET BY MOUTH DAILY AT 6 PM., Disp: 30 tablet, Rfl: 2 .   blood glucose meter kit and supplies, Check BS up to four times a day, Disp: 1 each, Rfl: 0 .  Blood Glucose Monitoring Suppl DEVI, Check bs up to four times a day, Disp: 1 each, Rfl: 0 .  budesonide-formoterol (SYMBICORT) 160-4.5 MCG/ACT inhaler, Inhale 2 puffs into the lungs 2 (two) times daily., Disp: 10.2 g, Rfl: 11 .  cyclobenzaprine (FLEXERIL) 10 MG tablet, Take 1 tablet (10 mg total) by mouth 3 (three) times daily as needed for muscle spasms., Disp: 90 tablet, Rfl: 5 .  FLUoxetine (PROZAC) 20 MG capsule, TAKE THREE (3) CAPSULES BY MOUTH DAILY., Disp: 90 capsule, Rfl: 0 .  furosemide (LASIX) 40 MG tablet, Take 1 tablet (40 mg total) by mouth 2 (two) times daily., Disp: 60 tablet, Rfl: 11 .  gabapentin (NEURONTIN) 100 MG capsule, Take 1-3 capsules (100-300 mg total) by mouth 2 (two) times daily. Increase by one tablet each week, Disp: 180 capsule, Rfl: 3 .  glucose blood (ACCU-CHEK AVIVA) test strip, Use to check blood sugar four times daily, Disp: 200 each, Rfl: 12 .  ibuprofen (ADVIL) 800 MG tablet, Take 1 tablet (800 mg total) by mouth every 8 (eight) hours as needed for moderate pain (dental or back pain)., Disp: 90 tablet, Rfl: 1 .  Insulin Glargine (LANTUS SOLOSTAR) 100 UNIT/ML Solostar Pen, Inject 62-80 Units into the skin 2 (two) times daily., Disp: 45  mL, Rfl: 2 .  Insulin Lispro (HUMALOG KWIKPEN) 200 UNIT/ML SOPN, Inject 10-30 Units into the skin 2 (two) times daily before a meal., Disp: 3 mL, Rfl: 5 .  Insulin Pen Needle 31G X 8 MM MISC, 1 Units by Does not apply route 4 (four) times daily., Disp: 100 each, Rfl: 2 .  Lancet Devices MISC, Test QID, DX E11.65, Aviva Plus, Disp: 100 each, Rfl: prn .  lisinopril (ZESTRIL) 5 MG tablet, Take 1 tablet (5 mg total) by mouth daily., Disp: 90 tablet, Rfl: 3 .  OLANZapine (ZYPREXA) 10 MG tablet, Take 1 tablet (10 mg total) by mouth at bedtime., Disp: 90 tablet, Rfl: 0 .  omeprazole (PRILOSEC) 20 MG capsule, TAKE ONE CAPSULE BY MOUTH ONCE DAILY.  (MORNING), Disp: 90 capsule, Rfl: 3 .  potassium chloride (K-DUR) 10 MEQ tablet, TAKE ONE TABLET BY MOUTH TWICE DAILY., Disp: 60 tablet, Rfl: 2 .  sitaGLIPtin (JANUVIA) 100 MG tablet, Take 1 tablet (100 mg total) by mouth daily., Disp: 90 tablet, Rfl: 0 .  solifenacin (VESICARE) 10 MG tablet, TAKE ONE TABLET BY MOUTH DAILY., Disp: 30 tablet, Rfl: 11 .  SPIRIVA HANDIHALER 18 MCG inhalation capsule, INHALE ONE PUFF BY MOUTH DAILY., Disp: 30 capsule, Rfl: 12 .  traMADol (ULTRAM) 50 MG tablet, Take 1 tablet (50 mg total) by mouth every 6 (six) hours as needed., Disp: 120 tablet, Rfl: 2  Blood pressure 135/77.  Assessment/ Plan: 48 y.o. female   1. Obesity hypoventilation syndrome [facility]) It appears that she previously had an appointment scheduled at [facility] with Dr. Merlene Laughter for sleep study but unfortunately tested positive for COVID-19.  She is asymptomatic and status post 3 weeks from testing.  I recommended that she have repeat testing.  I placed referral back to Dr. Merlene Laughter to schedule her sleep study. - Ambulatory referral to Neurology  2. Chronic left-sided low back pain with left-sided sciatica She also wished to establish care with him for back pain.  I have gone ahead and renewed her tramadol.  I reviewed the national narcotic database and there were no red flags.  She is using the medication sparingly. - traMADol (ULTRAM) 50 MG tablet; Take 1 tablet (50 mg total) by mouth every 12 (twelve) hours as needed for severe pain.  Dispense: 60 tablet; Refill: 2 - Ambulatory referral to Neurology  3. Uncontrolled type 2 diabetes mellitus with hyperglycemia ([place]) Has follow-up visit with Dr. Dorris Fetch scheduled for the 23rd.  Continue to increase Lantus as we discussed.  Further management per endocrinology   Start time: 3:27pm End time: 3:37pm  Total time spent on patient care (including telephone call/ virtual visit): 15 minutes  [place], [place] [phone]

## 2019-07-25 ENCOUNTER — Other Ambulatory Visit: Payer: Self-pay | Admitting: Family Medicine

## 2019-07-25 DIAGNOSIS — F3132 Bipolar disorder, current episode depressed, moderate: Secondary | ICD-10-CM

## 2019-08-04 ENCOUNTER — Ambulatory Visit: Payer: Medicare Other | Admitting: "Endocrinology

## 2019-08-31 ENCOUNTER — Other Ambulatory Visit: Payer: Self-pay | Admitting: Family Medicine

## 2019-08-31 DIAGNOSIS — F3132 Bipolar disorder, current episode depressed, moderate: Secondary | ICD-10-CM

## 2019-09-10 ENCOUNTER — Other Ambulatory Visit: Payer: Self-pay | Admitting: Family Medicine

## 2019-09-10 ENCOUNTER — Other Ambulatory Visit: Payer: Self-pay | Admitting: Physician Assistant

## 2019-09-10 DIAGNOSIS — F3132 Bipolar disorder, current episode depressed, moderate: Secondary | ICD-10-CM

## 2019-09-10 DIAGNOSIS — K047 Periapical abscess without sinus: Secondary | ICD-10-CM

## 2019-09-10 DIAGNOSIS — E109 Type 1 diabetes mellitus without complications: Secondary | ICD-10-CM

## 2019-09-16 ENCOUNTER — Ambulatory Visit (INDEPENDENT_AMBULATORY_CARE_PROVIDER_SITE_OTHER): Payer: Medicare Other | Admitting: Family Medicine

## 2019-09-16 ENCOUNTER — Other Ambulatory Visit: Payer: Self-pay

## 2019-09-16 DIAGNOSIS — R1084 Generalized abdominal pain: Secondary | ICD-10-CM | POA: Diagnosis not present

## 2019-09-16 DIAGNOSIS — F3132 Bipolar disorder, current episode depressed, moderate: Secondary | ICD-10-CM | POA: Diagnosis not present

## 2019-09-16 MED ORDER — OLANZAPINE 15 MG PO TABS: 15.0000 mg | ORAL_TABLET | Freq: Every day | ORAL | 0 refills | Status: DC

## 2019-09-16 MED ORDER — FLUOXETINE HCL 20 MG PO CAPS: ORAL_CAPSULE | ORAL | 5 refills | Status: DC

## 2019-09-16 NOTE — Progress Notes (Signed)
Telephone visit  Subjective: CC: Abdominal pain PCP: [name], DO NTI:Aimee Phillips is a 49 y.o. female calls for telephone consult today. Patient provides verbal consent for consult held via phone.  Due to COVID-19 pandemic this visit was conducted virtually. This visit type was conducted due to national recommendations for restrictions regarding the COVID-19 Pandemic (e.g. social distancing, sheltering in place) in an effort to limit this patient's exposure and mitigate transmission in our community. All issues noted in this document were discussed and addressed.  A physical exam was not performed with this format.   Location of patient: Home Location of provider: Working remotely from home Others present for call: Family  1.  Abdominal pain Patient reports several month history of generalized abdominal pain.  She notes that sometimes her belly feels hard and at other times it feels normal.  She has had a history of chronic constipation but notes having had a normal bowel movement this morning.  Sometimes she gets severe pain after eating.  She denies any diarrheal stools or blood in stool.  Occasionally she has had dry heaving but no overt vomiting.  She is tolerating fluids.  No fevers.  2.  Bipolar disorder Patient reports compliance with fluoxetine 60 mg daily and olanzapine 10 mg nightly.  She does feel that symptoms are not well controlled and she would like to go up on medicine or be changed to something else.  She is not currently established with psychiatry.  No SI or HI.   ROS: Per HPI  No Known Allergies Past Medical History:  Diagnosis Date  . Asthma   . Bipolar affect, depressed ([place])   . Cellulitis   . COPD (chronic obstructive pulmonary disease) ([place])   . Depression   . Diabetes mellitus without complication ([place])   . PE (pulmonary embolism)   . Pulmonary edema     Current Outpatient Medications:  .  albuterol (PROVENTIL HFA;VENTOLIN HFA) 108 (90  Base) MCG/ACT inhaler, Inhale 2 puffs into the lungs every 6 (six) hours as needed for wheezing or shortness of breath., Disp: 1 Inhaler, Rfl: 0 .  atorvastatin (LIPITOR) 20 MG tablet, TAKE ONE TABLET BY MOUTH DAILY AT 6 PM., Disp: 30 tablet, Rfl: 2 .  blood glucose meter kit and supplies, Check BS up to four times a day, Disp: 1 each, Rfl: 0 .  Blood Glucose Monitoring Suppl DEVI, Check bs up to four times a day, Disp: 1 each, Rfl: 0 .  budesonide-formoterol (SYMBICORT) 160-4.5 MCG/ACT inhaler, Inhale 2 puffs into the lungs 2 (two) times daily., Disp: 10.2 g, Rfl: 11 .  cyclobenzaprine (FLEXERIL) 10 MG tablet, Take 1 tablet (10 mg total) by mouth 3 (three) times daily as needed for muscle spasms., Disp: 90 tablet, Rfl: 5 .  FLUoxetine (PROZAC) 20 MG capsule, TAKE THREE (3) CAPSULES BY MOUTH DAILY., Disp: 90 capsule, Rfl: 0 .  furosemide (LASIX) 40 MG tablet, Take 1 tablet (40 mg total) by mouth 2 (two) times daily., Disp: 60 tablet, Rfl: 11 .  gabapentin (NEURONTIN) 100 MG capsule, Take 1-3 capsules (100-300 mg total) by mouth 2 (two) times daily. Increase by one tablet each week, Disp: 180 capsule, Rfl: 3 .  glucose blood (ACCU-CHEK AVIVA) test strip, Use to check blood sugar four times daily, Disp: 200 each, Rfl: 12 .  ibuprofen (ADVIL) 800 MG tablet, Take 1 tablet (800 mg total) by mouth every 8 (eight) hours as needed for moderate pain (dental or back pain)., Disp: 90  tablet, Rfl: 1 .  Insulin Glargine (LANTUS SOLOSTAR) 100 UNIT/ML Solostar Pen, Inject 62-80 Units into the skin 2 (two) times daily., Disp: 45 mL, Rfl: 2 .  Insulin Lispro (HUMALOG KWIKPEN) 200 UNIT/ML SOPN, Inject 10-30 Units into the skin 2 (two) times daily before a meal., Disp: 3 mL, Rfl: 5 .  Insulin Pen Needle (SURE COMFORT PEN NEEDLES) 31G X 5 MM MISC, Use with insulin QID Dx E11.65, Disp: 100 each, Rfl: 2 .  Lancet Devices MISC, Test QID, DX E11.65, Aviva Plus, Disp: 100 each, Rfl: prn .  lisinopril (ZESTRIL) 5 MG tablet,  Take 1 tablet (5 mg total) by mouth daily., Disp: 90 tablet, Rfl: 3 .  OLANZapine (ZYPREXA) 10 MG tablet, Take 1 tablet (10 mg total) by mouth at bedtime., Disp: 90 tablet, Rfl: 0 .  omeprazole (PRILOSEC) 20 MG capsule, TAKE ONE CAPSULE BY MOUTH ONCE DAILY. (MORNING), Disp: 90 capsule, Rfl: 3 .  potassium chloride (K-DUR) 10 MEQ tablet, TAKE ONE TABLET BY MOUTH TWICE DAILY., Disp: 60 tablet, Rfl: 2 .  sitaGLIPtin (JANUVIA) 100 MG tablet, Take 1 tablet (100 mg total) by mouth daily., Disp: 90 tablet, Rfl: 0 .  solifenacin (VESICARE) 10 MG tablet, TAKE ONE TABLET BY MOUTH DAILY., Disp: 30 tablet, Rfl: 11 .  SPIRIVA HANDIHALER 18 MCG inhalation capsule, INHALE ONE PUFF BY MOUTH DAILY., Disp: 30 capsule, Rfl: 12 .  traMADol (ULTRAM) 50 MG tablet, Take 1 tablet (50 mg total) by mouth every 12 (twelve) hours as needed for severe pain., Disp: 60 tablet, Rfl: 2  Assessment/ Plan: 49 y.o. female   1. Generalized abdominal pain Uncertain etiology.  May be related to chronic constipation.  At this time she is tolerating fluids.  It does not sound like she has an acute abdomen but we did discuss red flag signs and symptoms warranting further evaluation emergency department.  Now asked that she go ahead and increase her PPI to twice daily and I placed a referral to gastroenterology in [place] for further evaluation. - Ambulatory referral to Gastroenterology  2. Bipolar affective disorder, currently depressed, moderate ([place]) She reports uncontrolled symptoms and therefore I will increase her Zyprexa to 15 mg daily.  I had like to see her back in about 6 weeks for checkup on this dose adjustment.  May need to consider referral to psychiatry for their input as well. - FLUoxetine (PROZAC) 20 MG capsule; TAKE THREE (3) CAPSULES BY MOUTH DAILY.  Dispense: 90 capsule; Refill: 5 - OLANZapine (ZYPREXA) 15 MG tablet; Take 1 tablet (15 mg total) by mouth at bedtime.  Dispense: 90 tablet; Refill: 0   Start time:  1:55pm End time: 2:03pm  Total time spent on patient care (including telephone call/ virtual visit): 13 minutes  [place], [place] [phone]

## 2019-09-23 ENCOUNTER — Encounter: Payer: Self-pay | Admitting: Family Medicine

## 2019-09-23 ENCOUNTER — Ambulatory Visit (INDEPENDENT_AMBULATORY_CARE_PROVIDER_SITE_OTHER): Payer: Medicare Other | Admitting: Family Medicine

## 2019-09-23 ENCOUNTER — Encounter (INDEPENDENT_AMBULATORY_CARE_PROVIDER_SITE_OTHER): Payer: Self-pay

## 2019-09-23 DIAGNOSIS — J019 Acute sinusitis, unspecified: Secondary | ICD-10-CM | POA: Diagnosis not present

## 2019-09-23 MED ORDER — PREDNISONE 10 MG (21) PO TBPK: ORAL_TABLET | ORAL | 0 refills | Status: DC

## 2019-09-23 MED ORDER — AMOXICILLIN-POT CLAVULANATE 875-125 MG PO TABS: 1.0000 | ORAL_TABLET | Freq: Two times a day (BID) | ORAL | 0 refills | Status: AC

## 2019-09-23 NOTE — Progress Notes (Signed)
Virtual Visit via Telephone Note   I connected with Aimee Phillips on 09/23/19 at 1:14 PM by telephone and verified that I am speaking with the correct person using two identifiers. Aimee Phillips is currently located at home and nobody is currently with her during this visit. The provider, Loman Brooklyn, FNP is located in their office at time of visit.  I discussed the limitations, risks, security and privacy concerns of performing an evaluation and management service by telephone and the availability of in person appointments. I also discussed with the patient that there may be a patient responsible charge related to this service. The patient expressed understanding and agreed to proceed.  Subjective: PCP: [name], DO  Chief Complaint  Patient presents with  . URI   Patient complains of head congestion, headache, runny nose, sore throat and facial pain/pressure. Onset of symptoms was 2 days ago, unchanged since that time. She is drinking plenty of fluids. Evaluation to date: none. Treatment to date: decongestants. She has a history of asthma and COPD. She does not smoke. Patient does wear oxygen continuously at 3L via [place].    ROS: Per HPI  Current Outpatient Medications:  .  albuterol (PROVENTIL HFA;VENTOLIN HFA) 108 (90 Base) MCG/ACT inhaler, Inhale 2 puffs into the lungs every 6 (six) hours as needed for wheezing or shortness of breath., Disp: 1 Inhaler, Rfl: 0 .  atorvastatin (LIPITOR) 20 MG tablet, TAKE ONE TABLET BY MOUTH DAILY AT 6 PM., Disp: 30 tablet, Rfl: 2 .  blood glucose meter kit and supplies, Check BS up to four times a day, Disp: 1 each, Rfl: 0 .  Blood Glucose Monitoring Suppl DEVI, Check bs up to four times a day, Disp: 1 each, Rfl: 0 .  budesonide-formoterol (SYMBICORT) 160-4.5 MCG/ACT inhaler, Inhale 2 puffs into the lungs 2 (two) times daily., Disp: 10.2 g, Rfl: 11 .  cyclobenzaprine (FLEXERIL) 10 MG tablet, Take 1 tablet (10 mg total) by mouth 3  (three) times daily as needed for muscle spasms., Disp: 90 tablet, Rfl: 5 .  FLUoxetine (PROZAC) 20 MG capsule, TAKE THREE (3) CAPSULES BY MOUTH DAILY., Disp: 90 capsule, Rfl: 5 .  furosemide (LASIX) 40 MG tablet, Take 1 tablet (40 mg total) by mouth 2 (two) times daily., Disp: 60 tablet, Rfl: 11 .  gabapentin (NEURONTIN) 100 MG capsule, Take 1-3 capsules (100-300 mg total) by mouth 2 (two) times daily. Increase by one tablet each week, Disp: 180 capsule, Rfl: 3 .  glucose blood (ACCU-CHEK AVIVA) test strip, Use to check blood sugar four times daily, Disp: 200 each, Rfl: 12 .  ibuprofen (ADVIL) 800 MG tablet, Take 1 tablet (800 mg total) by mouth every 8 (eight) hours as needed for moderate pain (dental or back pain)., Disp: 90 tablet, Rfl: 1 .  Insulin Glargine (LANTUS SOLOSTAR) 100 UNIT/ML Solostar Pen, Inject 62-80 Units into the skin 2 (two) times daily., Disp: 45 mL, Rfl: 2 .  Insulin Lispro (HUMALOG KWIKPEN) 200 UNIT/ML SOPN, Inject 10-30 Units into the skin 2 (two) times daily before a meal., Disp: 3 mL, Rfl: 5 .  Insulin Pen Needle (SURE COMFORT PEN NEEDLES) 31G X 5 MM MISC, Use with insulin QID Dx E11.65, Disp: 100 each, Rfl: 2 .  Lancet Devices MISC, Test QID, DX E11.65, Aviva Plus, Disp: 100 each, Rfl: prn .  lisinopril (ZESTRIL) 5 MG tablet, Take 1 tablet (5 mg total) by mouth daily., Disp: 90 tablet, Rfl: 3 .  OLANZapine (ZYPREXA)  15 MG tablet, Take 1 tablet (15 mg total) by mouth at bedtime., Disp: 90 tablet, Rfl: 0 .  omeprazole (PRILOSEC) 20 MG capsule, TAKE ONE CAPSULE BY MOUTH ONCE DAILY. (MORNING), Disp: 90 capsule, Rfl: 3 .  potassium chloride (K-DUR) 10 MEQ tablet, TAKE ONE TABLET BY MOUTH TWICE DAILY., Disp: 60 tablet, Rfl: 2 .  sitaGLIPtin (JANUVIA) 100 MG tablet, Take 1 tablet (100 mg total) by mouth daily., Disp: 90 tablet, Rfl: 0 .  solifenacin (VESICARE) 10 MG tablet, TAKE ONE TABLET BY MOUTH DAILY., Disp: 30 tablet, Rfl: 11 .  SPIRIVA HANDIHALER 18 MCG inhalation capsule,  INHALE ONE PUFF BY MOUTH DAILY., Disp: 30 capsule, Rfl: 12 .  traMADol (ULTRAM) 50 MG tablet, Take 1 tablet (50 mg total) by mouth every 12 (twelve) hours as needed for severe pain., Disp: 60 tablet, Rfl: 2  No Known Allergies Past Medical History:  Diagnosis Date  . Asthma   . Bipolar affect, depressed ([place])   . Cellulitis   . COPD (chronic obstructive pulmonary disease) ([place])   . Depression   . Diabetes mellitus without complication ([place])   . PE (pulmonary embolism)   . Pulmonary edema     Observations/Objective: A&O  No respiratory distress or wheezing audible over the phone Mood, judgement, and thought processes all WNL  Assessment and Plan: 1. Acute non-recurrent sinusitis, unspecified location - Education provided on sinusitis. Unable to send patient for COVID-19 testing as she was positive in November so it has not been 90 days yet. Discussed symptom management.  - amoxicillin-clavulanate (AUGMENTIN) 875-125 MG tablet; Take 1 tablet by mouth 2 (two) times daily for 7 days.  Dispense: 14 tablet; Refill: 0 - predniSONE (STERAPRED UNI-PAK 21 TAB) 10 MG (21) TBPK tablet; As directed x 6 days  Dispense: 21 tablet; Refill: 0   Follow Up Instructions:  I discussed the assessment and treatment plan with the patient. The patient was provided an opportunity to ask questions and all were answered. The patient agreed with the plan and demonstrated an understanding of the instructions.   The patient was advised to call back or seek an in-person evaluation if the symptoms worsen or if the condition fails to improve as anticipated.  The above assessment and management plan was discussed with the patient. The patient verbalized understanding of and has agreed to the management plan. Patient is aware to call the clinic if symptoms persist or worsen. Patient is aware when to return to the clinic for a follow-up visit. Patient educated on when it is appropriate to go to the emergency  department.   Time call ended: 1:21 PM  I provided 11 minutes of non-face-to-face time during this encounter.  Hendricks Limes, MSN, APRN, FNP-C [facility] 09/23/19

## 2019-09-23 NOTE — Patient Instructions (Signed)

## 2019-09-30 ENCOUNTER — Telehealth (INDEPENDENT_AMBULATORY_CARE_PROVIDER_SITE_OTHER): Payer: Self-pay | Admitting: Gastroenterology

## 2019-09-30 NOTE — Telephone Encounter (Signed)
Chart reviewed prior to visit - patient seen last week by PCP for respiratory symptoms. Per notes unable to be tested for covid because she has been positive in 90 days. Given her current symptoms and  covid can't be ruled out prefer to switch to telephone visit or video visit if she is agreeable.   Please contact and let her know. Thanks.

## 2019-10-01 ENCOUNTER — Encounter (INDEPENDENT_AMBULATORY_CARE_PROVIDER_SITE_OTHER): Payer: Self-pay | Admitting: Gastroenterology

## 2019-10-01 ENCOUNTER — Ambulatory Visit (INDEPENDENT_AMBULATORY_CARE_PROVIDER_SITE_OTHER): Payer: Medicare Other | Admitting: Gastroenterology

## 2019-10-01 ENCOUNTER — Other Ambulatory Visit: Payer: Self-pay

## 2019-10-01 VITALS — Ht 68.0 in | Wt 350.0 lb

## 2019-10-01 DIAGNOSIS — R1084 Generalized abdominal pain: Secondary | ICD-10-CM

## 2019-10-01 DIAGNOSIS — R110 Nausea: Secondary | ICD-10-CM

## 2019-10-01 MED ORDER — HYOSCYAMINE SULFATE 0.125 MG SL SUBL: 0.1250 mg | SUBLINGUAL_TABLET | Freq: Four times a day (QID) | SUBLINGUAL | 0 refills | Status: DC | PRN

## 2019-10-01 MED ORDER — SUCRALFATE 1 G PO TABS: 1.0000 g | ORAL_TABLET | Freq: Two times a day (BID) | ORAL | 3 refills | Status: DC

## 2019-10-01 NOTE — Progress Notes (Signed)
Patient profile: Aimee Phillips is a 49 y.o. female seen for evaluation of RUQ pain. PMhx of asthma/COPD chronically on O2 3L via . Patient seen last week virtually by PCP w/ respiratory symptoms, given unable to repeat COVID testing since she has been positive within past 90 days office visit transitioned to telephone only visit.    16 minutes on phone  History of Present Illness: CHERINE Phillips is seen today for diffuse abdominal pain, feels pain is worse on right side. Reports abd pain worse over past 2-3 weeks, described as a cramping/stabbing pain and that her stomach feels hard at times. She denies having pain like this in past. Reports some chronic nausea -worse over past 2-3 weeks as well, nausea doesn't worsen w/ food intake. Sometimes food worsens pain (she reports pizza, spagetthi makes pain worse) but also just drinking water can cause pain. She denies acid reflux w/ the pain. Feels pain worst under right rib cage.  She increased omeprazole 28m once a day to twice a day - she takes before food, increased dose of omeprazole did not help symptoms.   She reports taking stool softener 2 per day, she has variable bowel ahbits - can have 2-3 BM in same day, other times may go 3 days between stools. Having a BM doesn't make pain better. Denies any blood in stool.   She is taking ibuprofen 8053monce to twice a day, takes w/ food. Takes tramadol once every 6 hrs. She completed Augmentin course today and didn't feel like affected pain. Feeling better from respiratory standpoint after abx and steroids.   Reports current pain level is 6/10. Laying down makes pain better, she is watching grand daughter so can't lay down often.   Wt Readings from Last 3 Encounters:  10/01/19 (!) 350 lb (158.8 kg)  03/27/19 (!) 350 lb (158.8 kg)  03/21/19 (!) 347 lb (157.4 kg)    Weight at home today - 350#  She reports she previously was #384 (may a year ago)   Last Colonoscopy: none prior  Last  Endoscopy: none prior    Past Medical History:  Past Medical History:  Diagnosis Date  . Asthma   . Bipolar affect, depressed ([facility]  . Cellulitis   . COPD (chronic obstructive pulmonary disease) ([facility] [place]  . Depression   . Diabetes mellitus without complication ([facility]  . PE (pulmonary embolism)   . Pulmonary edema     Problem List: Patient Active Problem List   Diagnosis Date Noted  . Neuropathy 01/02/2019  . Lower abdominal pain 05/15/2018  . Cystocele with prolapse 05/15/2018  . Type 2 diabetes mellitus with hyperglycemia ([facility]07/10/2017  . 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 Chronic obstructive pulmonary disease ([facility]CT[facility]06/25/2019  . Cyst of left ovary 01/18/2018  . Pelvic pain 01/18/2018  . Chronic right-sided low back pain 04/20/2017  . At high risk for falls 04/20/2017  . Muscle weakness 04/20/2017  . Injury of back 02/02/2017  . Chronic left-sided low back pain with left-sided sciatica 02/02/2017  . Deformity of toenail 02/02/2017  . Hyperlipidemia associated with type 2 diabetes mellitus (HCUnion05/25/2018  . Well adult exam 02/02/2017  . Morbid obesity ([facility] [place]11/20/2012  . Obesity hypoventilation syndrome ([facility]11/20/2012  . Bipolar affect, depressed ([facility] [facility]    Past Surgical History: Past Surgical History:  Procedure Laterality Date  . CESAREAN SECTION    . CHOLECYSTECTOMY      Allergies: No Known  Allergies    Home Medications:  Current Outpatient Medications:  .  albuterol (PROVENTIL HFA;VENTOLIN HFA) 108 (90 Base) MCG/ACT inhaler, Inhale 2 puffs into the lungs every 6 (six) hours as needed for wheezing or shortness of breath., Disp: 1 Inhaler, Rfl: 0 .  atorvastatin (LIPITOR) 20 MG tablet, TAKE ONE TABLET BY MOUTH DAILY AT 6 PM., Disp: 30 tablet, Rfl: 2 .  blood glucose meter kit and supplies, Check BS up to four times a day, Disp: 1 each, Rfl: 0 .  Blood Glucose Monitoring Suppl DEVI, Check bs up to four times a day, Disp: 1  each, Rfl: 0 .  budesonide-formoterol (SYMBICORT) 160-4.5 MCG/ACT inhaler, Inhale 2 puffs into the lungs 2 (two) times daily., Disp: 10.2 g, Rfl: 11 .  cyclobenzaprine (FLEXERIL) 10 MG tablet, Take 1 tablet (10 mg total) by mouth 3 (three) times daily as needed for muscle spasms., Disp: 90 tablet, Rfl: 5 .  FLUoxetine (PROZAC) 20 MG capsule, TAKE THREE (3) CAPSULES BY MOUTH DAILY., Disp: 90 capsule, Rfl: 5 .  furosemide (LASIX) 40 MG tablet, Take 1 tablet (40 mg total) by mouth 2 (two) times daily., Disp: 60 tablet, Rfl: 11 .  gabapentin (NEURONTIN) 100 MG capsule, Take 1-3 capsules (100-300 mg total) by mouth 2 (two) times daily. Increase by one tablet each week, Disp: 180 capsule, Rfl: 3 .  glucose blood (ACCU-CHEK AVIVA) test strip, Use to check blood sugar four times daily, Disp: 200 each, Rfl: 12 .  ibuprofen (ADVIL) 800 MG tablet, Take 1 tablet (800 mg total) by mouth every 8 (eight) hours as needed for moderate pain (dental or back pain)., Disp: 90 tablet, Rfl: 1 .  Insulin Glargine (LANTUS SOLOSTAR) 100 UNIT/ML Solostar Pen, Inject 62-80 Units into the skin 2 (two) times daily., Disp: 45 mL, Rfl: 2 .  Insulin Lispro (HUMALOG KWIKPEN) 200 UNIT/ML SOPN, Inject 10-30 Units into the skin 2 (two) times daily before a meal., Disp: 3 mL, Rfl: 5 .  Insulin Pen Needle (SURE COMFORT PEN NEEDLES) 31G X 5 MM MISC, Use with insulin QID Dx E11.65, Disp: 100 each, Rfl: 2 .  Lancet Devices MISC, Test QID, DX E11.65, Aviva Plus, Disp: 100 each, Rfl: prn .  lisinopril (ZESTRIL) 5 MG tablet, Take 1 tablet (5 mg total) by mouth daily., Disp: 90 tablet, Rfl: 3 .  OLANZapine (ZYPREXA) 15 MG tablet, Take 1 tablet (15 mg total) by mouth at bedtime., Disp: 90 tablet, Rfl: 0 .  omeprazole (PRILOSEC) 20 MG capsule, TAKE ONE CAPSULE BY MOUTH ONCE DAILY. (MORNING), Disp: 90 capsule, Rfl: 3 .  potassium chloride (K-DUR) 10 MEQ tablet, TAKE ONE TABLET BY MOUTH TWICE DAILY., Disp: 60 tablet, Rfl: 2 .  sitaGLIPtin (JANUVIA)  100 MG tablet, Take 1 tablet (100 mg total) by mouth daily., Disp: 90 tablet, Rfl: 0 .  solifenacin (VESICARE) 10 MG tablet, TAKE ONE TABLET BY MOUTH DAILY., Disp: 30 tablet, Rfl: 11 .  SPIRIVA HANDIHALER 18 MCG inhalation capsule, INHALE ONE PUFF BY MOUTH DAILY., Disp: 30 capsule, Rfl: 12 .  traMADol (ULTRAM) 50 MG tablet, Take 1 tablet (50 mg total) by mouth every 12 (twelve) hours as needed for severe pain., Disp: 60 tablet, Rfl: 2 .  hyoscyamine (LEVSIN SL) 0.125 MG SL tablet, Place 1 tablet (0.125 mg total) under the tongue every 6 (six) hours as needed (abd pain)., Disp: 30 tablet, Rfl: 0 .  sucralfate (CARAFATE) 1 g tablet, Take 1 tablet (1 g total) by mouth 2 (two) times daily  before a meal., Disp: 60 tablet, Rfl: 3   Family History: family history includes Cancer in her mother and sister; Diabetes in her brother; Heart disease in her sister; Heart failure in her father.    Social History:   reports that she has never smoked. She has never used smokeless tobacco. She reports that she does not drink alcohol or use drugs.   Review of Systems: Constitutional: Denies weight loss/weight gain  Eyes: No changes in vision. ENT: No oral lesions, sore throat.  GI: see HPI.  Heme/Lymph: No easy bruising.  CV: No chest pain.  GU: No hematuria.  Integumentary: No rashes.  Neuro: No headaches.  Psych: No depression/anxiety.  Endocrine: No heat/cold intolerance.  Allergic/Immunologic: No urticaria.  Resp: No cough, SOB.  Musculoskeletal: No joint swelling.    Physical Examination: Ht 5' 8" (1.727 m)   Wt (!) 350 lb (158.8 kg) Comment: per patient - this was at her last OV 1 month ago.  BMI 53.22 kg/m  Telephone visit - NAD    Data Reviewed:  CT a/p 09/2017-done for RUQ pain-Hepatic steatosis.  Otherwise no acute abnormality within the solid abdominal organs.  Multiple retroperitoneal and mesenteric  lymph nodes, sub pathologic by size criteria.  4.6 cm left ovarian  cyst.  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 This may represent a disc extrusion into the fracture line. Potentially, this could also represent a pathologic fracture through a pre-existing bone lesion.  06/2019-positive COVID   02/2019--CMP w/ AST 47, glucose 392, Cl 94. hgb A1c 13.9  Assessment/Plan: Ms. Sakamoto is a 49 y.o. female    Miliani was seen today for follow-up.  Diagnoses and all orders for this visit:  Diffuse abdominal pain -     CBC with Differential -     COMPLETE METABOLIC PANEL WITH GFR -     Lipase  Nausea without vomiting -     CBC with Differential -     COMPLETE METABOLIC PANEL WITH GFR -     Lipase  Other orders -     sucralfate (CARAFATE) 1 g tablet; Take 1 tablet (1 g total) by mouth 2 (two) times daily before a meal. -     hyoscyamine (LEVSIN SL) 0.125 MG SL tablet; Place 1 tablet (0.125 mg total) under the tongue every 6 (six) hours as needed (abd pain).   1.  Diffuse abdominal pain/worst right upper quadrant-Per chart review has been going on dating back to 2019, She does feel this is slightly worse.  She was recently on a course of antibiotics and steroids as well as taking ibuprofen 872m 1-2 times a day. She tried increasing omeprazole without improvement from once a day to twice a day.  Will have her try some Carafate and give her Levsin to use as needed.  Reviewed if her symptoms worsen she can go to ER for evaluation.  Depending on response would consider abdominal imaging versus endoscopy.  She has chronic COPD and is on 3 L of oxygen long-term making her higher risk for endoscopic procedures  Further recommendations pending lab results and response to med changes.  Once her Covid-like symptoms have resolved ideally would like to follow-up in the office 3 to 4 weeks and do abdominal exam    I personally performed the service, non-incident to. (WP)  JLaurine Blazer  [facility]

## 2019-10-08 ENCOUNTER — Other Ambulatory Visit (INDEPENDENT_AMBULATORY_CARE_PROVIDER_SITE_OTHER): Payer: Self-pay | Admitting: Gastroenterology

## 2019-10-08 DIAGNOSIS — R1084 Generalized abdominal pain: Secondary | ICD-10-CM

## 2019-10-08 DIAGNOSIS — D72829 Elevated white blood cell count, unspecified: Secondary | ICD-10-CM

## 2019-10-08 LAB — CBC WITH DIFFERENTIAL/PLATELET
Absolute Monocytes: 932 cells/uL (ref 200–950)
Basophils Absolute: 54 cells/uL (ref 0–200)
Basophils Relative: 0.4 %
Eosinophils Absolute: 122 cells/uL (ref 15–500)
Eosinophils Relative: 0.9 %
HCT: 41.5 % (ref 35.0–45.0)
Hemoglobin: 13.5 g/dL (ref 11.7–15.5)
Lymphs Abs: 2916 cells/uL (ref 850–3900)
MCH: 29.5 pg (ref 27.0–33.0)
MCHC: 32.5 g/dL (ref 32.0–36.0)
MCV: 90.6 fL (ref 80.0–100.0)
MPV: 13.2 fL — ABNORMAL HIGH (ref 7.5–12.5)
Monocytes Relative: 6.9 %
Neutro Abs: 9477 cells/uL — ABNORMAL HIGH (ref 1500–7800)
Neutrophils Relative %: 70.2 %
Platelets: 238 10*3/uL (ref 140–400)
RBC: 4.58 10*6/uL (ref 3.80–5.10)
RDW: 12.1 % (ref 11.0–15.0)
Total Lymphocyte: 21.6 %
WBC: 13.5 10*3/uL — ABNORMAL HIGH (ref 3.8–10.8)

## 2019-10-08 LAB — COMPLETE METABOLIC PANEL WITH GFR
AG Ratio: 1.4 (calc) (ref 1.0–2.5)
ALT: 30 U/L — ABNORMAL HIGH (ref 6–29)
AST: 33 U/L (ref 10–35)
Albumin: 3.8 g/dL (ref 3.6–5.1)
Alkaline phosphatase (APISO): 70 U/L (ref 31–125)
BUN: 9 mg/dL (ref 7–25)
CO2: 29 mmol/L (ref 20–32)
Calcium: 9.2 mg/dL (ref 8.6–10.2)
Chloride: 97 mmol/L — ABNORMAL LOW (ref 98–110)
Creat: 0.64 mg/dL (ref 0.50–1.10)
GFR, Est African American: 122 mL/min/{1.73_m2} (ref >=60)
GFR, Est Non African American: 106 mL/min/{1.73_m2} (ref >=60)
Globulin: 2.7 g/dL (calc) (ref 1.9–3.7)
Glucose, Bld: 284 mg/dL — ABNORMAL HIGH (ref 65–99)
Potassium: 4.1 mmol/L (ref 3.5–5.3)
Sodium: 137 mmol/L (ref 135–146)
Total Bilirubin: 0.3 mg/dL (ref 0.2–1.2)
Total Protein: 6.5 g/dL (ref 6.1–8.1)

## 2019-10-08 LAB — LIPASE: Lipase: 44 U/L (ref 7–60)

## 2019-10-08 NOTE — Progress Notes (Unsigned)
I discussed symptoms with patient.  Her respiratory symptoms seem improved and she has no longer having any respiratory symptoms above her baseline chronic COPD.  Continues to have abdominal pain, Carafate did not seem to help much.  She has been Levsin helps some but it makes her sleepy.  Given her white blood cell count is elevated she will proceed with CT.  Order placed in chart.  Encounter routed to [place] to schedule

## 2019-10-09 NOTE — Progress Notes (Signed)
CT sch'd 10/22/19 2 (145), npo 4 hrs, pick up contrast, patient aware

## 2019-10-14 ENCOUNTER — Other Ambulatory Visit: Payer: Self-pay | Admitting: Family Medicine

## 2019-10-14 DIAGNOSIS — G8929 Other chronic pain: Secondary | ICD-10-CM

## 2019-10-20 ENCOUNTER — Ambulatory Visit (INDEPENDENT_AMBULATORY_CARE_PROVIDER_SITE_OTHER): Payer: Medicare Other | Admitting: Family Medicine

## 2019-10-20 ENCOUNTER — Encounter: Payer: Self-pay | Admitting: Family Medicine

## 2019-10-20 DIAGNOSIS — B373 Candidiasis of vulva and vagina: Secondary | ICD-10-CM | POA: Diagnosis not present

## 2019-10-20 DIAGNOSIS — B3731 Acute candidiasis of vulva and vagina: Secondary | ICD-10-CM

## 2019-10-20 MED ORDER — FLUCONAZOLE 150 MG PO TABS: ORAL_TABLET | ORAL | 0 refills | Status: DC

## 2019-10-20 NOTE — Progress Notes (Signed)
Patient tried bad had covid and they would not see her. Advised patient to call today to set up follow up and she agreed. Patient states once she has an appointment with them she will schedule follow up with you.

## 2019-10-20 NOTE — Patient Instructions (Signed)
Vaginal Yeast Infection, Adult  Vaginal yeast infection is a condition that causes vaginal discharge as well as soreness, swelling, and redness (inflammation) of the vagina. This is a common condition. Some women get this infection frequently. What are the causes? This condition is caused by a change in the normal balance of the yeast (candida) and bacteria that live in the vagina. This change causes an overgrowth of yeast, which causes the inflammation. What increases the risk? The condition is more likely to develop in women who:  Take antibiotic medicines.  Have diabetes.  Take birth control pills.  Are pregnant.  Douche often.  Have a weak body defense system (immune system).  Have been taking steroid medicines for a long time.  Frequently wear tight clothing. What are the signs or symptoms? Symptoms of this condition include:  White, thick, creamy vaginal discharge.  Swelling, itching, redness, and irritation of the vagina. The lips of the vagina (vulva) may be affected as well.  Pain or a burning feeling while urinating.  Pain during sex. How is this diagnosed? This condition is diagnosed based on:  Your medical history.  A physical exam.  A pelvic exam. Your health care provider will examine a sample of your vaginal discharge under a microscope. Your health care provider may send this sample for testing to confirm the diagnosis. How is this treated? This condition is treated with medicine. Medicines may be over-the-counter or prescription. You may be told to use one or more of the following:  Medicine that is taken by mouth (orally).  Medicine that is applied as a cream (topically).  Medicine that is inserted directly into the vagina (suppository). Follow these instructions at home:  Lifestyle  Do not have sex until your health care provider approves. Tell your sex partner that you have a yeast infection. That person should go to his or her health care  provider and ask if they should also be treated.  Do not wear tight clothes, such as pantyhose or tight pants.  Wear breathable cotton underwear. General instructions  Take or apply over-the-counter and prescription medicines only as told by your health care provider.  Eat more yogurt. This may help to keep your yeast infection from returning.  Do not use tampons until your health care provider approves.  Try taking a sitz bath to help with discomfort. This is a warm water bath that is taken while you are sitting down. The water should only come up to your hips and should cover your buttocks. Do this 3-4 times per day or as told by your health care provider.  Do not douche.  If you have diabetes, keep your blood sugar levels under control.  Keep all follow-up visits as told by your health care provider. This is important. Contact a health care provider if:  You have a fever.  Your symptoms go away and then return.  Your symptoms do not get better with treatment.  Your symptoms get worse.  You have new symptoms.  You develop blisters in or around your vagina.  You have blood coming from your vagina and it is not your menstrual period.  You develop pain in your abdomen. Summary  Vaginal yeast infection is a condition that causes discharge as well as soreness, swelling, and redness (inflammation) of the vagina.  This condition is treated with medicine. Medicines may be over-the-counter or prescription.  Take or apply over-the-counter and prescription medicines only as told by your health care provider.  Do not douche.   Do not have sex or use tampons until your health care provider approves.  Contact a health care provider if your symptoms do not get better with treatment or your symptoms go away and then return. This information is not intended to replace advice given to you by your health care provider. Make sure you discuss any questions you have with your health care  provider. Document Revised: 03/28/2019 Document Reviewed: 01/14/2018 Elsevier Patient Education  2020 Elsevier Inc.  

## 2019-10-20 NOTE — Progress Notes (Signed)
Telephone visit  Subjective: CC: yeast infection PCP: [name], DO TMA:UQJFHL Aimee Phillips is a 49 y.o. female calls for telephone consult today. Patient provides verbal consent for consult held via phone.  Due to COVID-19 pandemic this visit was conducted virtually. This visit type was conducted due to national recommendations for restrictions regarding the COVID-19 Pandemic (e.g. social distancing, sheltering in place) in an effort to limit this patient's exposure and mitigate transmission in our community. All issues noted in this document were discussed and addressed.  A physical exam was not performed with this format.   Location of patient: home Location of provider: Working remotely from home Others present for call: none  1. Vaginal discharge She reports vaginal burning, itching and bread odor to vaginal discharge that has been ongoing for 3-4 days.  She has had several yeast infections over the last several months that have responded well to Diflucan.  She uncontrolled diabetes.  She is not had a formal pelvic exam.  She is sexually active.  Denies any concern for STDs as she is in a monogamous relationship.  Contraception: BTL.  LMP is to have been erratic chronically.  No OTC therapies tried  ROS: Per HPI  No Known Allergies Past Medical History:  Diagnosis Date  . Asthma   . Bipolar affect, depressed ([place])   . Cellulitis   . COPD (chronic obstructive pulmonary disease) ([place])   . Depression   . Diabetes mellitus without complication ([place])   . PE (pulmonary embolism)   . Pulmonary edema     Current Outpatient Medications:  .  albuterol (PROVENTIL HFA;VENTOLIN HFA) 108 (90 Base) MCG/ACT inhaler, Inhale 2 puffs into the lungs every 6 (six) hours as needed for wheezing or shortness of breath., Disp: 1 Inhaler, Rfl: 0 .  atorvastatin (LIPITOR) 20 MG tablet, TAKE ONE TABLET BY MOUTH DAILY AT 6 PM., Disp: 30 tablet, Rfl: 2 .  blood glucose meter kit and supplies, Check BS  up to four times a day, Disp: 1 each, Rfl: 0 .  Blood Glucose Monitoring Suppl DEVI, Check bs up to four times a day, Disp: 1 each, Rfl: 0 .  budesonide-formoterol (SYMBICORT) 160-4.5 MCG/ACT inhaler, Inhale 2 puffs into the lungs 2 (two) times daily., Disp: 10.2 g, Rfl: 11 .  cyclobenzaprine (FLEXERIL) 10 MG tablet, Take 1 tablet (10 mg total) by mouth 3 (three) times daily as needed for muscle spasms., Disp: 90 tablet, Rfl: 5 .  FLUoxetine (PROZAC) 20 MG capsule, TAKE THREE (3) CAPSULES BY MOUTH DAILY., Disp: 90 capsule, Rfl: 5 .  furosemide (LASIX) 40 MG tablet, Take 1 tablet (40 mg total) by mouth 2 (two) times daily., Disp: 60 tablet, Rfl: 11 .  gabapentin (NEURONTIN) 100 MG capsule, Take 1-3 capsules (100-300 mg total) by mouth 2 (two) times daily. Increase by one tablet each week, Disp: 180 capsule, Rfl: 3 .  glucose blood (ACCU-CHEK AVIVA) test strip, Use to check blood sugar four times daily, Disp: 200 each, Rfl: 12 .  hyoscyamine (LEVSIN SL) 0.125 MG SL tablet, Place 1 tablet (0.125 mg total) under the tongue every 6 (six) hours as needed (abd pain)., Disp: 30 tablet, Rfl: 0 .  ibuprofen (ADVIL) 800 MG tablet, Take 1 tablet (800 mg total) by mouth every 8 (eight) hours as needed for moderate pain (dental or back pain)., Disp: 90 tablet, Rfl: 1 .  Insulin Glargine (LANTUS SOLOSTAR) 100 UNIT/ML Solostar Pen, Inject 62-80 Units into the skin 2 (two) times daily., Disp: 45  mL, Rfl: 2 .  Insulin Lispro (HUMALOG KWIKPEN) 200 UNIT/ML SOPN, Inject 10-30 Units into the skin 2 (two) times daily before a meal., Disp: 3 mL, Rfl: 5 .  Insulin Pen Needle (SURE COMFORT PEN NEEDLES) 31G X 5 MM MISC, Use with insulin QID Dx E11.65, Disp: 100 each, Rfl: 2 .  Lancet Devices MISC, Test QID, DX E11.65, Aviva Plus, Disp: 100 each, Rfl: prn .  lisinopril (ZESTRIL) 5 MG tablet, Take 1 tablet (5 mg total) by mouth daily., Disp: 90 tablet, Rfl: 3 .  OLANZapine (ZYPREXA) 15 MG tablet, Take 1 tablet (15 mg total) by  mouth at bedtime., Disp: 90 tablet, Rfl: 0 .  omeprazole (PRILOSEC) 20 MG capsule, TAKE ONE CAPSULE BY MOUTH ONCE DAILY. (MORNING), Disp: 90 capsule, Rfl: 3 .  potassium chloride (K-DUR) 10 MEQ tablet, TAKE ONE TABLET BY MOUTH TWICE DAILY., Disp: 60 tablet, Rfl: 2 .  sitaGLIPtin (JANUVIA) 100 MG tablet, Take 1 tablet (100 mg total) by mouth daily., Disp: 90 tablet, Rfl: 0 .  solifenacin (VESICARE) 10 MG tablet, TAKE ONE TABLET BY MOUTH DAILY., Disp: 30 tablet, Rfl: 11 .  SPIRIVA HANDIHALER 18 MCG inhalation capsule, INHALE ONE PUFF BY MOUTH DAILY., Disp: 30 capsule, Rfl: 12 .  sucralfate (CARAFATE) 1 g tablet, Take 1 tablet (1 g total) by mouth 2 (two) times daily before a meal., Disp: 60 tablet, Rfl: 3 .  traMADol (ULTRAM) 50 MG tablet, Take 1 tablet (50 mg total) by mouth every 12 (twelve) hours as needed for severe pain., Disp: 60 tablet, Rfl: 2  Assessment/ Plan: 49 y.o. female   1. Vaginal yeast infection Suspected recurrent vaginal yeast infection.  She certainly has enough risk factors given body habitus, diabetes.  However, she has not had a formal pelvic exam which does cause any pause.  I did discuss with her that ideally we would see her in office to do a pelvic exam to confirm diagnosis even though her symptoms are very consistent with a vaginal yeast infection.  Televisits limit the ability to make a concrete diagnosis.  Given her response to Diflucan in the past, we will proceed with treatment for recurrent vaginal yeast infection but low threshold to have her evaluated by either myself or GYN should symptoms recur despite use of antifungal.  She voiced good understanding and will follow up as needed - fluconazole (DIFLUCAN) 150 MG tablet; Take 1 tablet every 3 days for 2 doses.  THEN, take 1 tablet every 7 days for 6 months for recurrent yeast infection.  Dispense: 26 tablet; Refill: 0   Start time: 10:30am End time: 10:38a  Total time spent on patient care (including telephone  call/ virtual visit): 16 minutes  [place], [place] [facility] [phone]

## 2019-10-21 ENCOUNTER — Other Ambulatory Visit: Payer: Self-pay

## 2019-10-21 NOTE — Patient Outreach (Signed)
[facility]) Care Management  10/21/2019  [name] Mar 08, 1971 [id number]   Medication Adherence call to Mrs. Brandy Hale [place] Identifiers Verify patient is showing past due on Atorvastatin 20 mg,patient explain she takes 1 tablet daily and has pick up from the pharmacy. Mrs. [place] is showing past due under [facility].   Lillia Abed CPhT Pharmacy Technician [facility] Care Management Direct Dial [phone]  Fax [phone] [email or web address]

## 2019-10-22 ENCOUNTER — Ambulatory Visit (HOSPITAL_COMMUNITY): Admission: RE | Admit: 2019-10-22 | Payer: Medicare Other | Source: Ambulatory Visit

## 2019-10-31 ENCOUNTER — Other Ambulatory Visit: Payer: Self-pay | Admitting: Family Medicine

## 2019-10-31 DIAGNOSIS — E109 Type 1 diabetes mellitus without complications: Secondary | ICD-10-CM

## 2019-10-31 DIAGNOSIS — E1165 Type 2 diabetes mellitus with hyperglycemia: Secondary | ICD-10-CM

## 2019-11-04 ENCOUNTER — Other Ambulatory Visit: Payer: Self-pay

## 2019-11-04 ENCOUNTER — Ambulatory Visit (INDEPENDENT_AMBULATORY_CARE_PROVIDER_SITE_OTHER): Payer: Medicare Other | Admitting: "Endocrinology

## 2019-11-04 ENCOUNTER — Encounter: Payer: Self-pay | Admitting: "Endocrinology

## 2019-11-04 VITALS — BP 118/78 | HR 93 | Ht 68.0 in | Wt 350.0 lb

## 2019-11-04 DIAGNOSIS — I10 Essential (primary) hypertension: Secondary | ICD-10-CM | POA: Diagnosis not present

## 2019-11-04 DIAGNOSIS — Z794 Long term (current) use of insulin: Secondary | ICD-10-CM | POA: Diagnosis not present

## 2019-11-04 DIAGNOSIS — E1165 Type 2 diabetes mellitus with hyperglycemia: Secondary | ICD-10-CM

## 2019-11-04 DIAGNOSIS — E782 Mixed hyperlipidemia: Secondary | ICD-10-CM

## 2019-11-04 LAB — POCT GLYCOSYLATED HEMOGLOBIN (HGB A1C): Hemoglobin A1C: 9.9 % — AB (ref 4.0–5.6)

## 2019-11-04 MED ORDER — LANTUS SOLOSTAR 100 UNIT/ML ~~LOC~~ SOPN: 80.0000 [IU] | PEN_INJECTOR | Freq: Every day | SUBCUTANEOUS | 2 refills | Status: DC

## 2019-11-04 MED ORDER — METFORMIN HCL ER 500 MG PO TB24: 500.0000 mg | ORAL_TABLET | Freq: Every day | ORAL | 3 refills | Status: DC

## 2019-11-04 MED ORDER — HUMALOG KWIKPEN 200 UNIT/ML ~~LOC~~ SOPN: 10.0000 [IU] | PEN_INJECTOR | Freq: Three times a day (TID) | SUBCUTANEOUS | 2 refills | Status: DC

## 2019-11-04 NOTE — Progress Notes (Signed)
Endocrinology consult note       11/04/2019, 2:41 PM   Subjective:    Patient ID: Aimee Phillips, female    DOB: April 14, 1971.  Aimee Phillips is being seen in consultation for management of currently uncontrolled symptomatic diabetes requested by  [name], DO.   Past Medical History:  Diagnosis Date  . Asthma   . Bipolar affect, depressed ([place])   . Cellulitis   . COPD (chronic obstructive pulmonary disease) ([place])   . Depression   . Diabetes mellitus without complication ([place])   . PE (pulmonary embolism)   . Pulmonary edema     Past Surgical History:  Procedure Laterality Date  . CESAREAN SECTION    . CHOLECYSTECTOMY      Social History   Socioeconomic History  . Marital status: Married    Spouse name: Not on file  . Number of children: 3  . Years of education: Not on file  . Highest education level: High school graduate  Occupational History  . Occupation: Disabled  Tobacco Use  . Smoking status: Never Smoker  . Smokeless tobacco: Never Used  Substance and Sexual Activity  . Alcohol use: No  . Drug use: No  . Sexual activity: Not Currently  Other Topics Concern  . Not on file  Social History Narrative  . Not on file   Social Determinants of Health   Financial Resource Strain: Low Risk   . Difficulty of Paying Living Expenses: Not hard at all  Food Insecurity: No Food Insecurity  . Worried About Charity fundraiser in the Last Year: Never true  . Ran Out of Food in the Last Year: Never true  Transportation Needs: No Transportation Needs  . Lack of Transportation (Medical): No  . Lack of Transportation (Non-Medical): No  Physical Activity: Inactive  . Days of Exercise per Week: 0 days  . Minutes of Exercise per Session: 0 min  Stress: No Stress Concern Present  . Feeling of Stress : Not at all  Social Connections: Unknown  . Frequency of Communication with  Friends and Family: More than three times a week  . Frequency of Social Gatherings with Friends and Family: More than three times a week  . Attends Religious Services: Not on file  . Active Member of Clubs or Organizations: Not on file  . Attends Archivist Meetings: Not on file  . Marital Status: Not on file    Family History  Problem Relation Age of Onset  . Cancer Mother        breast  . Heart failure Father        died of AMI  . Cancer Sister   . Diabetes Brother   . Heart disease Sister     Outpatient Encounter Medications as of 11/04/2019  Medication Sig  . albuterol (PROVENTIL HFA;VENTOLIN HFA) 108 (90 Base) MCG/ACT inhaler Inhale 2 puffs into the lungs every 6 (six) hours as needed for wheezing or shortness of breath.  Marland Kitchen atorvastatin (LIPITOR) 20 MG tablet TAKE ONE TABLET BY MOUTH DAILY AT 6 PM.  . blood glucose meter kit and supplies Check BS up  to four times a day  . Blood Glucose Monitoring Suppl DEVI Check bs up to four times a day  . budesonide-formoterol (SYMBICORT) 160-4.5 MCG/ACT inhaler Inhale 2 puffs into the lungs 2 (two) times daily.  . cyclobenzaprine (FLEXERIL) 10 MG tablet Take 1 tablet (10 mg total) by mouth 3 (three) times daily as needed for muscle spasms.  . fluconazole (DIFLUCAN) 150 MG tablet Take 1 tablet every 3 days for 2 doses.  THEN, take 1 tablet every 7 days for 6 months for recurrent yeast infection.  Marland Kitchen FLUoxetine (PROZAC) 20 MG capsule TAKE THREE (3) CAPSULES BY MOUTH DAILY.  . furosemide (LASIX) 40 MG tablet Take 1 tablet (40 mg total) by mouth 2 (two) times daily.  Marland Kitchen gabapentin (NEURONTIN) 100 MG capsule Take 1-3 capsules (100-300 mg total) by mouth 2 (two) times daily. Increase by one tablet each week  . glucose blood (ACCU-CHEK AVIVA) test strip Use to check blood sugar four times daily  . hyoscyamine (LEVSIN SL) 0.125 MG SL tablet Place 1 tablet (0.125 mg total) under the tongue every 6 (six) hours as needed (abd pain).  Marland Kitchen ibuprofen  (ADVIL) 800 MG tablet Take 1 tablet (800 mg total) by mouth every 8 (eight) hours as needed for moderate pain (dental or back pain).  . Insulin Glargine (LANTUS SOLOSTAR) 100 UNIT/ML Solostar Pen Inject 80 Units into the skin at bedtime.  . Insulin Lispro (HUMALOG KWIKPEN) 200 UNIT/ML SOPN Inject 10-16 Units into the skin 3 (three) times daily before meals.  [employer] MISC Test QID, DX E11.65, Aviva Plus  . lisinopril (ZESTRIL) 5 MG tablet Take 1 tablet (5 mg total) by mouth daily.  . metFORMIN (GLUCOPHAGE XR) 500 MG 24 hr tablet Take 1 tablet (500 mg total) by mouth daily with breakfast.  . OLANZapine (ZYPREXA) 15 MG tablet Take 1 tablet (15 mg total) by mouth at bedtime.  Marland Kitchen omeprazole (PRILOSEC) 20 MG capsule TAKE ONE CAPSULE BY MOUTH ONCE DAILY. (MORNING)  . potassium chloride (K-DUR) 10 MEQ tablet TAKE ONE TABLET BY MOUTH TWICE DAILY.  . sitaGLIPtin (JANUVIA) 100 MG tablet Take 1 tablet (100 mg total) by mouth daily.  . solifenacin (VESICARE) 10 MG tablet TAKE ONE TABLET BY MOUTH DAILY.  Marland Kitchen SPIRIVA HANDIHALER 18 MCG inhalation capsule INHALE ONE PUFF BY MOUTH DAILY.  Marland Kitchen sucralfate (CARAFATE) 1 g tablet Take 1 tablet (1 g total) by mouth 2 (two) times daily before a meal.  . traMADol (ULTRAM) 50 MG tablet Take 1 tablet (50 mg total) by mouth every 12 (twelve) hours as needed for severe pain.  . TRUEPLUS PEN NEEDLES 31G X 5 MM MISC USE WITH INSULIN FOUR TIMES DAILY.  . [DISCONTINUED] Insulin Lispro (HUMALOG KWIKPEN) 200 UNIT/ML SOPN Inject 10-30 Units into the skin 2 (two) times daily before a meal.  . [DISCONTINUED] LANTUS SOLOSTAR 100 UNIT/ML Solostar Pen INJECT 62-80 UNITS SUBCUTANEOUSLY TWICE DAILY.   No facility-administered encounter medications on file as of 11/04/2019.    ALLERGIES: No Known Allergies  VACCINATION STATUS: Immunization History  Administered Date(s) Administered  . Influenza Inj Mdck Quad Pf 08/31/2018  . Influenza Split 08/11/2018  . Influenza-Unspecified  08/31/2018  . Tdap 02/14/2011    Diabetes She presents for her initial diabetic visit. She has type 2 diabetes mellitus. Onset time: She was diagnosed at approximate age of 44 years. Her disease course has been worsening. There are no hypoglycemic associated symptoms. Pertinent negatives for hypoglycemia include no confusion, headaches, pallor or seizures. Associated symptoms  include fatigue, polydipsia and polyuria. Pertinent negatives for diabetes include no chest pain and no polyphagia. There are no hypoglycemic complications. Symptoms are worsening. Diabetic complications include peripheral neuropathy. Risk factors for coronary artery disease include diabetes mellitus, dyslipidemia, obesity, hypertension and sedentary lifestyle. Current diabetic treatment includes insulin injections (She is currently on Lantus 60 units twice daily, Humalog 10-30 units twice daily, Januvia 100 mg p.o. daily.). Her weight is increasing steadily. She is following a generally unhealthy diet. When asked about meal planning, she reported none. She has not had a previous visit with a dietitian. She never participates in exercise. Her home blood glucose trend is increasing steadily. (She did not bring any logs nor meter with her today.  Her point-of-care A1c today was high at 9.9%.) An ACE inhibitor/angiotensin II receptor blocker is being taken. Eye exam is current.  Hyperlipidemia This is a chronic problem. The current episode started more than 1 year ago. The problem is uncontrolled. Exacerbating diseases include diabetes and obesity. Pertinent negatives include no chest pain, myalgias or shortness of breath. Risk factors for coronary artery disease include dyslipidemia, diabetes mellitus, obesity, hypertension and a sedentary lifestyle.  Hypertension This is a chronic problem. The current episode started more than 1 year ago. The problem is controlled. Pertinent negatives include no chest pain, headaches, palpitations or  shortness of breath. Risk factors for coronary artery disease include diabetes mellitus, dyslipidemia, obesity and sedentary lifestyle. Past treatments include ACE inhibitors.    Review of Systems  Constitutional: Positive for fatigue. Negative for chills, fever and unexpected weight change.  HENT: Negative for trouble swallowing and voice change.   Eyes: Negative for visual disturbance.  Respiratory: Negative for cough, shortness of breath and wheezing.   Cardiovascular: Negative for chest pain, palpitations and leg swelling.  Gastrointestinal: Negative for diarrhea, nausea and vomiting.  Endocrine: Positive for polydipsia and polyuria. Negative for cold intolerance, heat intolerance and polyphagia.  Musculoskeletal: Positive for gait problem. Negative for arthralgias and myalgias.       She is wheelchair-bound due to deconditioning and disequilibrium.  Skin: Negative for color change, pallor, rash and wound.  Neurological: Negative for seizures and headaches.  Psychiatric/Behavioral: Negative for confusion and suicidal ideas.    Objective:    Vitals with BMI 11/04/2019 10/01/2019 07/24/2019  Height '5\' 8"'$  '5\' 8"'$  -  Weight 350 lbs 350 lbs -  BMI 49.44 96.75 -  Systolic 916 - 384  Diastolic 78 - 77  Pulse 93 - -    BP 118/78   Pulse 93   Ht '5\' 8"'$  (1.727 m)   Wt (!) 350 lb (158.8 kg)   BMI 53.22 kg/m   Wt Readings from Last 3 Encounters:  11/04/19 (!) 350 lb (158.8 kg)  10/01/19 (!) 350 lb (158.8 kg)  03/27/19 (!) 350 lb (158.8 kg)     Physical Exam Constitutional:      Appearance: She is well-developed.  HENT:     Head: Normocephalic and atraumatic.  Neck:     Thyroid: No thyromegaly.     Trachea: No tracheal deviation.  Cardiovascular:     Rate and Rhythm: Normal rate.  Pulmonary:     Effort: Pulmonary effort is normal.  Abdominal:     Tenderness: There is no abdominal tenderness. There is no guarding.     Comments: Obese, soft  Musculoskeletal:        General:  Swelling present.     Cervical back: Normal range of motion and neck supple.  Comments: Wheelchair-bound due to deconditioning and disequilibrium.  She has mild dependent pretibial and pedal edema bilaterally.  Skin:    General: Skin is warm and dry.     Coloration: Skin is not pale.     Findings: No erythema or rash.     Comments: She has extensive acanthosis nigricans.  Neurological:     Mental Status: She is alert and oriented to person, place, and time.     Cranial Nerves: No cranial nerve deficit.     Coordination: Coordination normal.     Deep Tendon Reflexes: Reflexes are normal and symmetric.  Psychiatric:        Judgment: Judgment normal.      CMP     Component Value Date/Time   NA 137 10/07/2019 1106   NA 137 03/06/2019 1314   K 4.1 10/07/2019 1106   CL 97 (L) 10/07/2019 1106   CO2 29 10/07/2019 1106   GLUCOSE 284 (H) 10/07/2019 1106   BUN 9 10/07/2019 1106   BUN 6 03/06/2019 1314   CREATININE 0.64 10/07/2019 1106   CALCIUM 9.2 10/07/2019 1106   PROT 6.5 10/07/2019 1106   PROT 7.1 03/06/2019 1314   ALBUMIN 4.1 03/06/2019 1314   AST 33 10/07/2019 1106   ALT 30 (H) 10/07/2019 1106   ALKPHOS 87 03/06/2019 1314   BILITOT 0.3 10/07/2019 1106   BILITOT <0.2 03/06/2019 1314   GFRNONAA 106 10/07/2019 1106   GFRAA 122 10/07/2019 1106     Diabetic Labs (most recent): Lab Results  Component Value Date   HGBA1C 9.9 (A) 11/04/2019   HGBA1C 11.3 (H) 06/25/2019   HGBA1C 13.9 (H) 03/06/2019     Lipid Panel ( most recent) Lipid Panel     Component Value Date/Time   CHOL 174 03/06/2019 1314   TRIG 142 03/06/2019 1314   HDL 36 (L) 03/06/2019 1314   CHOLHDL 4.8 (H) 03/06/2019 1314   LDLCALC 110 (H) 03/06/2019 1314   LABVLDL 28 03/06/2019 1314      Lab Results  Component Value Date   TSH 1.100 01/31/2017   TSH 2.397 08/02/2011           Assessment & Plan:   1. Type 2 diabetes mellitus with hyperglycemia, with long-term current use of insulin  ([facility])  - Aimee Phillips has currently uncontrolled symptomatic type 2 DM since  49 years of age. She did not bring any logs nor meter with her today.  Her point-of-care A1c today was high at 9.9%. - Recent labs reviewed. - I had a long discussion with her about the progressive nature of diabetes and the pathology behind its complications. -her diabetes is complicated by obesity/sedentary life and she remains at a high risk for more acute and chronic complications which include CAD, CVA, CKD, retinopathy, and neuropathy. These are all discussed in detail with her.  - I have counseled her on diet  and weight management  by adopting a carbohydrate restricted/protein rich diet. Patient is encouraged to switch to  unprocessed or minimally processed     complex starch and increased protein intake (animal or plant source), fruits, and vegetables. -  she is advised to stick to a routine mealtimes to eat 3 meals  a day and avoid unnecessary snacks ( to snack only to correct hypoglycemia).   - she admits that there is a room for improvement in her food and drink choices. - Suggestion is made for her to avoid simple carbohydrates  from her diet including Cakes,  Sweet Desserts, Ice Cream, Soda (diet and regular), Sweet Tea, Candies, Chips, Cookies, Store Bought Juices, Alcohol in Excess of  1-2 drinks a day, Artificial Sweeteners,  Coffee Creamer, and "Sugar-free" Products. This will help patient to have more stable blood glucose profile and potentially avoid unintended weight gain.  - she will be scheduled with Jearld Fenton, RDN, CDE for diabetes education.  - I have approached her with the following individualized plan to manage  her diabetes and patient agrees:   -Patient is on a particularly large and complicated dose of insulin, will benefit from simplified treatment regimen. -In an attempt to simplify her treatment, she is advised to adjust her Lantus to 80 units nightly, lower her Humalog to 10   units 3 times a day with meals  for pre-meal BG readings of 90-'150mg'$ /dl, plus patient specific correction dose for unexpected hyperglycemia above '150mg'$ /dl, associated with strict monitoring of glucose 4 times a day-before meals and at bedtime. - she is warned not to take insulin without proper monitoring per orders. - Adjustment parameters are given to her for hypo and hyperglycemia in writing. - she is encouraged to call clinic for blood glucose levels less than 70 or above 300 mg /dl. - she is advised to continue Januvia 100 mg daily with breakfast, therapeutically suitable for patient . -She denies any prior side effect from Metformin.  She will be retried with low-dose of Metformin, 500 mg ER daily at breakfast.   - Specific targets for  A1c;  LDL, HDL,  and Triglycerides were discussed with the patient.  2) Blood Pressure /Hypertension:  her blood pressure is  controlled to target.   she is advised to continue her current medications including 5  mg p.o. daily with breakfast . 3) Lipids/Hyperlipidemia:   Review of her recent lipid panel showed un controlled  LDL at 110 .  she  is advised to continue    Lipitor 20 mg daily at bedtime.  Side effects and precautions discussed with her.  4)  Weight/Diet:  Body mass index is 53.22 kg/m.  -   clearly complicating her diabetes care.   she is  a candidate for modest weight loss. I discussed with her the fact that loss of 5 - 10% of her  current body weight will have the most impact on her diabetes management.  Exercise, and detailed carbohydrates information provided  -  detailed on discharge instructions.  5) Chronic Care/Health Maintenance:  -she  is on ACEI/ARB and Statin medications and  is encouraged to initiate and continue to follow up with Ophthalmology, Dentist,  Podiatrist at least yearly or according to recommendations, and advised to   stay away from smoking. I have recommended yearly flu vaccine and pneumonia vaccine at least every 5  years;  Due to her body habitus, and wheelchair-bound status she will not be able to exercise optimally.   - she is  advised to maintain close follow up with [name], DO for primary care needs, as well as her other providers for optimal and coordinated care.   - Time spent in this patient care: 65 min, of which > 50% was spent in  counseling  her about her currently uncontrolled, complicated type 2 diabetes, hyperlipidemia, hypertension, morbid obesity and the rest reviewing her blood glucose logs , discussing her hypoglycemia and hyperglycemia episodes, reviewing her current and  previous labs / studies  ( including abstraction from other facilities) and medications  doses and developing a  long  term treatment plan based on the latest standards of care/ guidelines; and documenting her care.    Please refer to Patient Instructions for Blood Glucose Monitoring and Insulin/Medications Dosing Guide"  in media tab for additional information. Please  also refer to " Patient Self Inventory" in the Media  tab for reviewed elements of pertinent patient history.  [name] participated in the discussions, expressed understanding, and voiced agreement with the above plans.  All questions were answered to her satisfaction. she is encouraged to contact clinic should she have any questions or concerns prior to her return visit.   Follow up plan: - Return in about 2 weeks (around 11/18/2019), or office, for Follow up with Meter and Logs Only - no Labs.  Glade Lloyd, MD [facility] [address] [place], [place] [address] Phone: [phone]  Fax: [phone]    11/04/2019, 2:41 PM  This note was partially dictated with voice recognition software. Similar sounding words can be transcribed inadequately or may not  be corrected upon review.

## 2019-11-04 NOTE — Patient Instructions (Signed)

## 2019-11-06 ENCOUNTER — Other Ambulatory Visit: Payer: Self-pay | Admitting: Physician Assistant

## 2019-11-18 ENCOUNTER — Ambulatory Visit: Payer: Medicare Other | Admitting: "Endocrinology

## 2019-11-19 ENCOUNTER — Ambulatory Visit: Payer: Medicare Other | Admitting: "Endocrinology

## 2019-11-24 ENCOUNTER — Other Ambulatory Visit: Payer: Self-pay | Admitting: Family Medicine

## 2019-11-24 DIAGNOSIS — Z794 Long term (current) use of insulin: Secondary | ICD-10-CM

## 2019-11-24 DIAGNOSIS — K047 Periapical abscess without sinus: Secondary | ICD-10-CM

## 2019-11-24 DIAGNOSIS — G8929 Other chronic pain: Secondary | ICD-10-CM

## 2019-11-24 DIAGNOSIS — E1165 Type 2 diabetes mellitus with hyperglycemia: Secondary | ICD-10-CM

## 2019-11-28 ENCOUNTER — Other Ambulatory Visit: Payer: Self-pay | Admitting: Physician Assistant

## 2019-11-28 DIAGNOSIS — E1169 Type 2 diabetes mellitus with other specified complication: Secondary | ICD-10-CM

## 2019-11-28 DIAGNOSIS — E785 Hyperlipidemia, unspecified: Secondary | ICD-10-CM

## 2019-12-03 ENCOUNTER — Ambulatory Visit (INDEPENDENT_AMBULATORY_CARE_PROVIDER_SITE_OTHER): Payer: Medicare Other | Admitting: Family Medicine

## 2019-12-03 ENCOUNTER — Other Ambulatory Visit: Payer: Self-pay

## 2019-12-03 ENCOUNTER — Encounter: Payer: Self-pay | Admitting: Family Medicine

## 2019-12-03 VITALS — BP 123/79 | HR 95 | Temp 96.7°F

## 2019-12-03 DIAGNOSIS — G629 Polyneuropathy, unspecified: Secondary | ICD-10-CM | POA: Diagnosis not present

## 2019-12-03 DIAGNOSIS — M2550 Pain in unspecified joint: Secondary | ICD-10-CM

## 2019-12-03 DIAGNOSIS — M5442 Lumbago with sciatica, left side: Secondary | ICD-10-CM

## 2019-12-03 DIAGNOSIS — Z79899 Other long term (current) drug therapy: Secondary | ICD-10-CM

## 2019-12-03 DIAGNOSIS — G8929 Other chronic pain: Secondary | ICD-10-CM

## 2019-12-03 DIAGNOSIS — Z23 Encounter for immunization: Secondary | ICD-10-CM | POA: Diagnosis not present

## 2019-12-03 MED ORDER — GABAPENTIN 300 MG PO CAPS: ORAL_CAPSULE | ORAL | 3 refills | Status: DC

## 2019-12-03 MED ORDER — TRAMADOL HCL 50 MG PO TABS: 50.0000 mg | ORAL_TABLET | Freq: Three times a day (TID) | ORAL | 2 refills | Status: DC | PRN

## 2019-12-03 NOTE — Progress Notes (Signed)
Subjective: CC: Neuropathy, chronic back pain PCP: [name], DO NFA:[id number] Aimee Phillips is a 49 y.o. female presenting to clinic today for:  1.  Neuropathy Patient reports neuropathy has not been well controlled with gabapentin 100 mg.  She typically only takes this twice daily but feels that maybe this needs to go up.  She denies excessive sleepiness from medication but does note fatigue because she has been taking care of a grandchild recently.  She reports burning in her feet such that she finds it hard to walk around much.  She is being cared for by Dr. Dorris Fetch for blood sugar, with recent addition of the Metformin.  So far she is tolerating this medicine okay.  She does note chronic abdominal discomfort that she was supposed to follow-up with gastroenterology for.  She notes this symptom is relieved by hyoscyamine, which is also prescribed by her gastroenterologist.  She does admit to continuing to eat some carb rich foods as well as daily consumption of soda.  2.  Chronic back pain Patient with ongoing chronic back pain.  She has been using her tramadol up to 3 times daily with some relief but not significant relief.  She has an upcoming appointment with Dr. Merlene Laughter to talk about alternative therapies.  She notes transportation is an issue and prevents her from going to a pain specialist in [place].  She was also told by a back specialist that there was nothing they could do about her back pain.  Weight loss was reinforced but again she has not been able to get up and around to exercise nor she carb restricting as above.  ROS: Per HPI  No Known Allergies Past Medical History:  Diagnosis Date  . Asthma   . Bipolar affect, depressed ([place])   . Cellulitis   . COPD (chronic obstructive pulmonary disease) ([place])   . Depression   . Diabetes mellitus without complication ([place])   . PE (pulmonary embolism)   . Pulmonary edema     Current Outpatient Medications:  .  albuterol  (PROVENTIL HFA;VENTOLIN HFA) 108 (90 Base) MCG/ACT inhaler, Inhale 2 puffs into the lungs every 6 (six) hours as needed for wheezing or shortness of breath., Disp: 1 Inhaler, Rfl: 0 .  atorvastatin (LIPITOR) 20 MG tablet, TAKE ONE TABLET BY MOUTH DAILY AT 6 PM., Disp: 30 tablet, Rfl: 2 .  blood glucose meter kit and supplies, Check BS up to four times a day, Disp: 1 each, Rfl: 0 .  Blood Glucose Monitoring Suppl DEVI, Check bs up to four times a day, Disp: 1 each, Rfl: 0 .  budesonide-formoterol (SYMBICORT) 160-4.5 MCG/ACT inhaler, Inhale 2 puffs into the lungs 2 (two) times daily., Disp: 10.2 g, Rfl: 11 .  cyclobenzaprine (FLEXERIL) 10 MG tablet, Take 1 tablet (10 mg total) by mouth 3 (three) times daily as needed for muscle spasms., Disp: 90 tablet, Rfl: 5 .  fluconazole (DIFLUCAN) 150 MG tablet, Take 1 tablet every 3 days for 2 doses.  THEN, take 1 tablet every 7 days for 6 months for recurrent yeast infection., Disp: 26 tablet, Rfl: 0 .  FLUoxetine (PROZAC) 20 MG capsule, TAKE THREE (3) CAPSULES BY MOUTH DAILY., Disp: 90 capsule, Rfl: 5 .  furosemide (LASIX) 40 MG tablet, Take 1 tablet (40 mg total) by mouth 2 (two) times daily., Disp: 60 tablet, Rfl: 11 .  gabapentin (NEURONTIN) 100 MG capsule, Take 1-3 capsules (100-300 mg total) by mouth 2 (two) times daily. Increase by one tablet  each week, Disp: 180 capsule, Rfl: 3 .  glucose blood (ACCU-CHEK AVIVA) test strip, Use to check blood sugar four times daily, Disp: 200 each, Rfl: 12 .  hyoscyamine (LEVSIN SL) 0.125 MG SL tablet, Place 1 tablet (0.125 mg total) under the tongue every 6 (six) hours as needed (abd pain)., Disp: 30 tablet, Rfl: 0 .  ibuprofen (ADVIL) 800 MG tablet, TAKE ONE TABLET BY MOUTH EVERY 8 HOURS AS NEEDED FOR MODERATE PAIN., Disp: 90 tablet, Rfl: 0 .  Insulin Glargine (LANTUS SOLOSTAR) 100 UNIT/ML Solostar Pen, Inject 80 Units into the skin at bedtime., Disp: 10 pen, Rfl: 2 .  Insulin Lispro (HUMALOG KWIKPEN) 200 UNIT/ML SOPN,  Inject 10-16 Units into the skin 3 (three) times daily before meals., Disp: 10 pen, Rfl: 2 .  JANUVIA 100 MG tablet, TAKE ONE TABLET BY MOUTH DAILY., Disp: 90 tablet, Rfl: 0 .  Lancet Devices MISC, Test QID, DX E11.65, Aviva Plus, Disp: 100 each, Rfl: prn .  lisinopril (ZESTRIL) 5 MG tablet, Take 1 tablet (5 mg total) by mouth daily., Disp: 90 tablet, Rfl: 3 .  metFORMIN (GLUCOPHAGE XR) 500 MG 24 hr tablet, Take 1 tablet (500 mg total) by mouth daily with breakfast., Disp: 30 tablet, Rfl: 3 .  OLANZapine (ZYPREXA) 15 MG tablet, Take 1 tablet (15 mg total) by mouth at bedtime., Disp: 90 tablet, Rfl: 0 .  omeprazole (PRILOSEC) 20 MG capsule, TAKE ONE CAPSULE BY MOUTH ONCE DAILY. (MORNING), Disp: 90 capsule, Rfl: 3 .  potassium chloride (KLOR-CON) 10 MEQ tablet, TAKE ONE TABLET BY MOUTH TWICE DAILY., Disp: 60 tablet, Rfl: 2 .  solifenacin (VESICARE) 10 MG tablet, TAKE ONE TABLET BY MOUTH DAILY., Disp: 30 tablet, Rfl: 11 .  SPIRIVA HANDIHALER 18 MCG inhalation capsule, INHALE ONE PUFF BY MOUTH DAILY., Disp: 30 capsule, Rfl: 12 .  sucralfate (CARAFATE) 1 g tablet, Take 1 tablet (1 g total) by mouth 2 (two) times daily before a meal., Disp: 60 tablet, Rfl: 3 .  traMADol (ULTRAM) 50 MG tablet, Take 1 tablet (50 mg total) by mouth every 12 (twelve) hours as needed for severe pain., Disp: 60 tablet, Rfl: 2 .  TRUEPLUS PEN NEEDLES 31G X 5 MM MISC, USE WITH INSULIN FOUR TIMES DAILY., Disp: 100 each, Rfl: 5 Social History   Socioeconomic History  . Marital status: Married    Spouse name: Not on file  . Number of children: 3  . Years of education: Not on file  . Highest education level: High school graduate  Occupational History  . Occupation: Disabled  Tobacco Use  . Smoking status: Never Smoker  . Smokeless tobacco: Never Used  Substance and Sexual Activity  . Alcohol use: No  . Drug use: No  . Sexual activity: Not Currently  Other Topics Concern  . Not on file  Social History Narrative  . Not  on file   Social Determinants of Health   Financial Resource Strain: Low Risk   . Difficulty of Paying Living Expenses: Not hard at all  Food Insecurity: No Food Insecurity  . Worried About Charity fundraiser in the Last Year: Never true  . Ran Out of Food in the Last Year: Never true  Transportation Needs: No Transportation Needs  . Lack of Transportation (Medical): No  . Lack of Transportation (Non-Medical): No  Physical Activity: Inactive  . Days of Exercise per Week: 0 days  . Minutes of Exercise per Session: 0 min  Stress: No Stress Concern Present  .  Feeling of Stress : Not at all  Social Connections: Unknown  . Frequency of Communication with Friends and Family: More than three times a week  . Frequency of Social Gatherings with Friends and Family: More than three times a week  . Attends Religious Services: Not on file  . Active Member of Clubs or Organizations: Not on file  . Attends Archivist Meetings: Not on file  . Marital Status: Not on file  Intimate Partner Violence: Not At Risk  . Fear of Current or Ex-Partner: No  . Emotionally Abused: No  . Physically Abused: No  . Sexually Abused: No   Family History  Problem Relation Age of Onset  . Cancer Mother        breast  . Heart failure Father        died of AMI  . Cancer Sister   . Diabetes Brother   . Heart disease Sister     Objective: Office vital signs reviewed. BP 123/79   Pulse 95   Temp (!) 96.7 F (35.9 C) (Temporal)   SpO2 94%   Physical Examination:  General: Awake, alert, morbidly obese, No acute distress HEENT: Normal, sclera white, MMM Cardio: regular rate and rhythm, S1S2 heard, no murmurs appreciated Pulm: clear to auscultation bilaterally, no wheezes, rhonchi or rales; somewhat dyspneic with speech Extremities: warm, well perfused, trace edema, No cyanosis or clubbing; +2 pulses bilaterally MSK: Arrives in wheelchair but is able to self propel and self transfer Skin:  Chronic venous stasis changes noted to bilateral lower extremities  Assessment/ Plan: 49 y.o. female   1. Arthralgia, unspecified joint Increase gabapentin to 300 mg nightly x1 week then twice daily for 1 week then 3 times daily.  I would like to see her back in the next couple months and we can increase this further if needed - gabapentin (NEURONTIN) 300 MG capsule; Take 1 capsule (300 mg total) by mouth at bedtime for 7 days, THEN 1 capsule (300 mg total) 2 (two) times daily for 7 days, THEN 1 capsule (300 mg total) 3 (three) times daily. Increase by one tablet each week.  Dispense: 90 capsule; Refill: 3  2. Neuropathy - gabapentin (NEURONTIN) 300 MG capsule; Take 1 capsule (300 mg total) by mouth at bedtime for 7 days, THEN 1 capsule (300 mg total) 2 (two) times daily for 7 days, THEN 1 capsule (300 mg total) 3 (three) times daily. Increase by one tablet each week.  Dispense: 90 capsule; Refill: 3  3. Chronic left-sided low back pain with left-sided sciatica The national narcotic database was reviewed and there were no red flags.  I understand that she will be seeing Dr. Merlene Laughter for pain management going forward but because she was in need of pain medication today I have gone ahead and renewed her tramadol with 3 times daily dosing, collected a UDS and completed controlled substance contract and alignment with the current office policy. - traMADol (ULTRAM) 50 MG tablet; Take 1 tablet (50 mg total) by mouth 3 (three) times daily as needed for severe pain.  Dispense: 60 tablet; Refill: 2 - ToxASSURE [employer] 13 (MW), Urine  4. Controlled substance agreement signed - ToxASSURE Select 13 (MW), Urine   No orders of the defined types were placed in this encounter.  No orders of the defined types were placed in this encounter.    [name], DO [place] [phone]

## 2019-12-03 NOTE — Patient Instructions (Signed)
We talked about getting rid of sugary drinks. The uncontrolled blood sugar is likely why you are so tired.  Controlled Substance Guidelines:  1. You cannot get an early refill, even it is lost.  2. You cannot get controlled medications from any other doctor, unless it is the emergency department and related to a new problem or injury.  3. You cannot use alcohol, marijuana, cocaine or any other recreational drugs while using this medication. This is very dangerous.  4. You are willing to have your urine drug tested at each visit.  5. You will not drive while using this medication, because that can put yourself and others in serious danger of an accident. 6. If any medication is stolen, then there must be a police report to verify it, or it cannot be refilled.  7. I will not prescribe these medications for longer than 3 months.  8. You must bring your pill bottle to each visit.  9. You must use the same pharmacy for all refills for the medication, unless you clear it with me beforehand.  10. You cannot share or sell this medication.

## 2019-12-07 LAB — TOXASSURE SELECT 13 (MW), URINE

## 2019-12-11 ENCOUNTER — Other Ambulatory Visit: Payer: Self-pay | Admitting: *Deleted

## 2020-01-19 ENCOUNTER — Other Ambulatory Visit (INDEPENDENT_AMBULATORY_CARE_PROVIDER_SITE_OTHER): Payer: Self-pay | Admitting: Internal Medicine

## 2020-01-20 ENCOUNTER — Telehealth (INDEPENDENT_AMBULATORY_CARE_PROVIDER_SITE_OTHER): Payer: Self-pay | Admitting: *Deleted

## 2020-01-20 NOTE — Telephone Encounter (Signed)
Patient has been given updated RX with refills. Per Dr.Rehman the patient will need to have a OV in 6 months with Liborio Nixon.

## 2020-01-27 ENCOUNTER — Other Ambulatory Visit: Payer: Self-pay

## 2020-01-27 MED ORDER — OMEPRAZOLE 20 MG PO CPDR: DELAYED_RELEASE_CAPSULE | ORAL | 1 refills | Status: DC

## 2020-02-03 ENCOUNTER — Telehealth: Payer: Self-pay | Admitting: Family Medicine

## 2020-02-03 DIAGNOSIS — IMO0002 Reserved for concepts with insufficient information to code with codable children: Secondary | ICD-10-CM

## 2020-02-03 DIAGNOSIS — E785 Hyperlipidemia, unspecified: Secondary | ICD-10-CM

## 2020-02-03 MED ORDER — ATORVASTATIN CALCIUM 20 MG PO TABS: ORAL_TABLET | ORAL | 0 refills | Status: DC

## 2020-02-03 MED ORDER — LISINOPRIL 5 MG PO TABS: 5.0000 mg | ORAL_TABLET | Freq: Every day | ORAL | 0 refills | Status: DC

## 2020-02-03 NOTE — Telephone Encounter (Signed)
°  Prescription Request  02/03/2020  What is the name of the medication or equipment? Chip Boer from [facility] calling on behalf of pt to request med refill. atorvastatin (LIPITOR) 20 MG tablet lisinopril (ZESTRIL) 5 MG tablet Have you contacted your pharmacy to request a refill? (if applicable) no  Which pharmacy would you like this sent to? [facility]   Patient notified that their request is being sent to the clinical staff for review and that they should receive a response within 2 business days.

## 2020-02-03 NOTE — Addendum Note (Signed)
Addended by: Adella Hare B on: 02/03/2020 01:42 PM   Modules accepted: Orders

## 2020-02-06 ENCOUNTER — Ambulatory Visit (INDEPENDENT_AMBULATORY_CARE_PROVIDER_SITE_OTHER): Payer: Medicare Other | Admitting: Nurse Practitioner

## 2020-02-06 ENCOUNTER — Encounter: Payer: Self-pay | Admitting: Family Medicine

## 2020-02-06 DIAGNOSIS — J0100 Acute maxillary sinusitis, unspecified: Secondary | ICD-10-CM | POA: Diagnosis not present

## 2020-02-06 MED ORDER — AMOXICILLIN-POT CLAVULANATE 875-125 MG PO TABS
1.0000 | ORAL_TABLET | Freq: Two times a day (BID) | ORAL | 0 refills | Status: DC
Start: 2020-02-06 — End: 2020-03-04

## 2020-02-06 NOTE — Progress Notes (Signed)
Virtual Visit via telephone Note Due to COVID-19 pandemic this visit was conducted virtually. This visit type was conducted due to national recommendations for restrictions regarding the COVID-19 Pandemic (e.g. social distancing, sheltering in place) in an effort to limit this patient's exposure and mitigate transmission in our community. All issues noted in this document were discussed and addressed.  A physical exam was not performed with this format.  I connected with Aimee Phillips on 02/06/20 at 1:50 by telephone and verified that I am speaking with the correct person using two identifiers. Aimee Phillips is currently located at home and no one is currently with her during visit. The provider, Mary-Margaret Hassell Done, FNP is located in their office at time of visit.  I discussed the limitations, risks, security and privacy concerns of performing an evaluation and management service by telephone and the availability of in person appointments. I also discussed with the patient that there may be a patient responsible charge related to this service. The patient expressed understanding and agreed to proceed.   History and Present Illness:   Chief Complaint: Sinusitis   HPI Patient call in today c/o head congestion and facial pressure. Started over a week ago and is getting worse. She says she think he has a low grade fever   Review of Systems  Constitutional: Positive for chills and fever.  HENT: Positive for congestion and sinus pain. Negative for sore throat.   Respiratory: Negative for cough.   Cardiovascular: Negative.   Neurological: Positive for dizziness and headaches.  Psychiatric/Behavioral: Negative.   All other systems reviewed and are negative.    Observations/Objective: Alert and oriented- answers all questions appropriately No distress Voice hoarse Dry cough   Assessment and Plan: Aimee Phillips in today with chief complaint of Sinusitis   1. Acute  non-recurrent maxillary sinusitis 1. Take meds as prescribed 2. Use a cool mist humidifier especially during the winter months and when heat has been humid. 3. Use saline nose sprays frequently 4. Saline irrigations of the nose can be very helpful if done frequently.  * 4X daily for 1 week*  * Use of a nettie pot can be helpful with this. Follow directions with this* 5. Drink plenty of fluids 6. Keep thermostat turn down low 7.For any cough or congestion  Use plain Mucinex- regular strength or max strength is fine   * Children- consult with Pharmacist for dosing 8. For fever or aces or pains- take tylenol or ibuprofen appropriate for age and weight.  * for fevers greater than 101 orally you may alternate ibuprofen and tylenol every  3 hours.   Meds ordered this encounter  Medications  . amoxicillin-clavulanate (AUGMENTIN) 875-125 MG tablet    Sig: Take 1 tablet by mouth 2 (two) times daily.    Dispense:  14 tablet    Refill:  0    Order Specific Question:   Supervising Provider    Answer:   Caryl Pina A [[id number]]        Follow Up Instructions: prn    I discussed the assessment and treatment plan with the patient. The patient was provided an opportunity to ask questions and all were answered. The patient agreed with the plan and demonstrated an understanding of the instructions.   The patient was advised to call back or seek an in-person evaluation if the symptoms worsen or if the condition fails to improve as anticipated.  The above assessment and management plan was discussed with the patient.  The patient verbalized understanding of and has agreed to the management plan. Patient is aware to call the clinic if symptoms persist or worsen. Patient is aware when to return to the clinic for a follow-up visit. Patient educated on when it is appropriate to go to the emergency department.   Time call ended:  2:03  I provided 13 minutes of non-face-to-face time during this  encounter.    Mary-Margaret Daphine Deutscher, FNP

## 2020-02-11 ENCOUNTER — Other Ambulatory Visit: Payer: Self-pay | Admitting: Family Medicine

## 2020-02-11 DIAGNOSIS — G8929 Other chronic pain: Secondary | ICD-10-CM

## 2020-02-16 ENCOUNTER — Other Ambulatory Visit: Payer: Self-pay | Admitting: Family Medicine

## 2020-02-16 DIAGNOSIS — K047 Periapical abscess without sinus: Secondary | ICD-10-CM

## 2020-03-04 ENCOUNTER — Ambulatory Visit (INDEPENDENT_AMBULATORY_CARE_PROVIDER_SITE_OTHER): Payer: Medicare Other | Admitting: Family Medicine

## 2020-03-04 ENCOUNTER — Encounter: Payer: Self-pay | Admitting: Family Medicine

## 2020-03-04 ENCOUNTER — Other Ambulatory Visit: Payer: Medicare Other

## 2020-03-04 ENCOUNTER — Other Ambulatory Visit: Payer: Self-pay

## 2020-03-04 DIAGNOSIS — M5442 Lumbago with sciatica, left side: Secondary | ICD-10-CM

## 2020-03-04 DIAGNOSIS — G8929 Other chronic pain: Secondary | ICD-10-CM | POA: Diagnosis not present

## 2020-03-04 DIAGNOSIS — G629 Polyneuropathy, unspecified: Secondary | ICD-10-CM | POA: Diagnosis not present

## 2020-03-04 DIAGNOSIS — M2550 Pain in unspecified joint: Secondary | ICD-10-CM | POA: Diagnosis not present

## 2020-03-04 MED ORDER — GABAPENTIN 400 MG PO CAPS: 400.0000 mg | ORAL_CAPSULE | Freq: Three times a day (TID) | ORAL | 3 refills | Status: DC

## 2020-03-04 MED ORDER — TRAMADOL HCL 50 MG PO TABS: 50.0000 mg | ORAL_TABLET | Freq: Three times a day (TID) | ORAL | 0 refills | Status: DC | PRN

## 2020-03-04 NOTE — Patient Instructions (Signed)
Call with pharmacy number  CALL DR DOONQUAH'S office.

## 2020-03-04 NOTE — Progress Notes (Signed)
Subjective: CC: Neuropathy, chronic back pain PCP: [name], DO WJX:BJYNWG D Polcyn is a 49 y.o. female presenting to clinic today for:  1.  Neuropathy She notes that neuropathy is improving quite a bit with the 3 mg of gabapentin 3 times daily. She does think that she could get better control though with increased dose I would like to increase this today. Denies any excessive daytime sleepiness or intolerance to the medication.  2.  Chronic back pain Patient with ongoing chronic back pain.  She continues to use the tramadol intermittently up to 3 times daily for severe back pain. She has Motrin and Tylenol as well. She notes that she was actually sent over Flexeril 7.5 mg and ketoprofen but is unsure as to who has prescribed this. She has her own Flexeril at home which is 10 mg. She did not get to see Dr. Merlene Laughter because he "had a family emergency" and her appointment was canceled. She has "attempted several times to reach him but there is never an answer". She does not feel that the tramadol is as helpful as it used to be.  ROS: Per HPI  No Known Allergies Past Medical History:  Diagnosis Date  . Asthma   . Bipolar affect, depressed ([place])   . Cellulitis   . COPD (chronic obstructive pulmonary disease) ([place])   . Depression   . Diabetes mellitus without complication ([place])   . PE (pulmonary embolism)   . Pulmonary edema     Current Outpatient Medications:  .  albuterol (PROVENTIL HFA;VENTOLIN HFA) 108 (90 Base) MCG/ACT inhaler, Inhale 2 puffs into the lungs every 6 (six) hours as needed for wheezing or shortness of breath., Disp: 1 Inhaler, Rfl: 0 .  atorvastatin (LIPITOR) 20 MG tablet, TAKE ONE TABLET BY MOUTH DAILY AT 6 PM., Disp: 30 tablet, Rfl: 0 .  blood glucose meter kit and supplies, Check BS up to four times a day, Disp: 1 each, Rfl: 0 .  Blood Glucose Monitoring Suppl DEVI, Check bs up to four times a day, Disp: 1 each, Rfl: 0 .  budesonide-formoterol  (SYMBICORT) 160-4.5 MCG/ACT inhaler, Inhale 2 puffs into the lungs 2 (two) times daily., Disp: 10.2 g, Rfl: 11 .  cyclobenzaprine (FLEXERIL) 10 MG tablet, Take 1 tablet (10 mg total) by mouth 3 (three) times daily as needed for muscle spasms., Disp: 90 tablet, Rfl: 5 .  FLUoxetine (PROZAC) 20 MG capsule, TAKE THREE (3) CAPSULES BY MOUTH DAILY., Disp: 90 capsule, Rfl: 5 .  furosemide (LASIX) 40 MG tablet, Take 1 tablet (40 mg total) by mouth 2 (two) times daily., Disp: 60 tablet, Rfl: 11 .  glucose blood (ACCU-CHEK AVIVA) test strip, Use to check blood sugar four times daily, Disp: 200 each, Rfl: 12 .  hyoscyamine (LEVSIN SL) 0.125 MG SL tablet, PLACE ONE TABLET UNDER THE TONGUE EVERY 6 HOURS AS NEEDED FOR ABDOMINAL PAIN, Disp: 120 tablet, Rfl: 2 .  ibuprofen (ADVIL) 800 MG tablet, TAKE ONE TABLET BY MOUTH EVERY 8 HOURS AS NEEDED FOR MODERATE PAIN., Disp: 90 tablet, Rfl: 0 .  Insulin Glargine (LANTUS SOLOSTAR) 100 UNIT/ML Solostar Pen, Inject 80 Units into the skin at bedtime., Disp: 10 pen, Rfl: 2 .  Insulin Lispro (HUMALOG KWIKPEN) 200 UNIT/ML SOPN, Inject 10-16 Units into the skin 3 (three) times daily before meals., Disp: 10 pen, Rfl: 2 .  JANUVIA 100 MG tablet, TAKE ONE TABLET BY MOUTH DAILY., Disp: 90 tablet, Rfl: 0 .  Lancet Devices MISC, Test QID,  DX E11.65, Aviva Plus, Disp: 100 each, Rfl: prn .  lisinopril (ZESTRIL) 5 MG tablet, Take 1 tablet (5 mg total) by mouth daily., Disp: 90 tablet, Rfl: 0 .  metFORMIN (GLUCOPHAGE XR) 500 MG 24 hr tablet, Take 1 tablet (500 mg total) by mouth daily with breakfast., Disp: 30 tablet, Rfl: 3 .  OLANZapine (ZYPREXA) 15 MG tablet, Take 1 tablet (15 mg total) by mouth at bedtime., Disp: 90 tablet, Rfl: 0 .  omeprazole (PRILOSEC) 20 MG capsule, Take one tablet by mouth daily, Disp: 90 capsule, Rfl: 1 .  potassium chloride (KLOR-CON) 10 MEQ tablet, TAKE ONE TABLET BY MOUTH TWICE DAILY., Disp: 60 tablet, Rfl: 2 .  solifenacin (VESICARE) 10 MG tablet, TAKE ONE  TABLET BY MOUTH DAILY., Disp: 30 tablet, Rfl: 11 .  SPIRIVA HANDIHALER 18 MCG inhalation capsule, INHALE ONE PUFF BY MOUTH DAILY., Disp: 30 capsule, Rfl: 12 .  sucralfate (CARAFATE) 1 g tablet, Take 1 tablet (1 g total) by mouth 2 (two) times daily before a meal., Disp: 60 tablet, Rfl: 3 .  traMADol (ULTRAM) 50 MG tablet, Take 1 tablet (50 mg total) by mouth 3 (three) times daily as needed for severe pain., Disp: 60 tablet, Rfl: 2 .  TRUEPLUS PEN NEEDLES 31G X 5 MM MISC, USE WITH INSULIN FOUR TIMES DAILY., Disp: 100 each, Rfl: 5 .  fluconazole (DIFLUCAN) 150 MG tablet, Take 1 tablet every 3 days for 2 doses.  THEN, take 1 tablet every 7 days for 6 months for recurrent yeast infection., Disp: 26 tablet, Rfl: 0 .  gabapentin (NEURONTIN) 300 MG capsule, Take 1 capsule (300 mg total) by mouth at bedtime for 7 days, THEN 1 capsule (300 mg total) 2 (two) times daily for 7 days, THEN 1 capsule (300 mg total) 3 (three) times daily. Increase by one tablet each week., Disp: 90 capsule, Rfl: 3 Social History   Socioeconomic History  . Marital status: Married    Spouse name: Not on file  . Number of children: 3  . Years of education: Not on file  . Highest education level: High school graduate  Occupational History  . Occupation: Disabled  Tobacco Use  . Smoking status: Never Smoker  . Smokeless tobacco: Never Used  Vaping Use  . Vaping Use: Never used  Substance and Sexual Activity  . Alcohol use: No  . Drug use: No  . Sexual activity: Not Currently  Other Topics Concern  . Not on file  Social History Narrative  . Not on file   Social Determinants of Health   Financial Resource Strain: Low Risk   . Difficulty of Paying Living Expenses: Not hard at all  Food Insecurity: No Food Insecurity  . Worried About Programme researcher, broadcasting/film/video in the Last Year: Never true  . Ran Out of Food in the Last Year: Never true  Transportation Needs: No Transportation Needs  . Lack of Transportation (Medical): No  .  Lack of Transportation (Non-Medical): No  Physical Activity: Inactive  . Days of Exercise per Week: 0 days  . Minutes of Exercise per Session: 0 min  Stress: No Stress Concern Present  . Feeling of Stress : Not at all  Social Connections: Unknown  . Frequency of Communication with Friends and Family: More than three times a week  . Frequency of Social Gatherings with Friends and Family: More than three times a week  . Attends Religious Services: Not on file  . Active Member of Clubs or Organizations: Not on  file  . Attends Archivist Meetings: Not on file  . Marital Status: Not on file  Intimate Partner Violence: Not At Risk  . Fear of Current or Ex-Partner: No  . Emotionally Abused: No  . Physically Abused: No  . Sexually Abused: No   Family History  Problem Relation Age of Onset  . Cancer Mother        breast  . Heart failure Father        died of AMI  . Cancer Sister   . Diabetes Brother   . Heart disease Sister     Objective: Office vital signs reviewed. BP 138/84   Pulse (!) 109   Temp (!) 97 F (36.1 C)   Ht '5\' 8"'$  (1.727 m)   Wt (!) 350 lb 9.6 oz (159 kg)   SpO2 94%   BMI 53.31 kg/m   Physical Examination:  General: Awake, alert, morbidly obese, No acute distress HEENT: Normal, sclera white, MMM Cardio: regular rate and rhythm  Pulm: Normal work of breathing on room air MSK: Arrives in wheelchair but is able to self propel    Assessment/ Plan: 49 y.o. female   1. Arthralgia, unspecified joint Not well controlled.  Likely secondary to morbid obesity. - gabapentin (NEURONTIN) 400 MG capsule; Take 1 capsule (400 mg total) by mouth 3 (three) times daily.  Dispense: 90 capsule; Refill: 3  2. Neuropathy Improving but not well controlled. I am going to increase her gabapentin dose to 400 mg 3 times daily. She needs to follow-up with Dr. Dorris Fetch. And I reiterated this during our exam today. - gabapentin (NEURONTIN) 400 MG capsule; Take 1 capsule (400 mg  total) by mouth 3 (three) times daily.  Dispense: 90 capsule; Refill: 3  3. Chronic left-sided low back pain with left-sided sciatica Again, needs to follow-up with her specialist as she discussed with me last visit. I will see if Lenna Sciara can help coordinate this appointment. In the interim, I have increased her gabapentin as above, tramadol was renewed to place on hold. The national narcotic database was reviewed and there were no red flags - traMADol (ULTRAM) 50 MG tablet; Take 1 tablet (50 mg total) by mouth 3 (three) times daily as needed for severe pain (MUST est care with Dr Merlene Laughter. Last fill).  Dispense: 60 tablet; Refill: 0   No orders of the defined types were placed in this encounter.  No orders of the defined types were placed in this encounter.    [name], DO [place] [phone]

## 2020-03-05 ENCOUNTER — Telehealth: Payer: Self-pay | Admitting: Family Medicine

## 2020-03-05 DIAGNOSIS — E785 Hyperlipidemia, unspecified: Secondary | ICD-10-CM

## 2020-03-05 DIAGNOSIS — J449 Chronic obstructive pulmonary disease, unspecified: Secondary | ICD-10-CM | POA: Diagnosis not present

## 2020-03-05 MED ORDER — ATORVASTATIN CALCIUM 20 MG PO TABS: ORAL_TABLET | ORAL | 0 refills | Status: DC

## 2020-03-05 NOTE — Telephone Encounter (Signed)
  Prescription Request  03/05/2020  What is the name of the medication or equipment? Atorvastatin  Have you contacted your pharmacy to request a refill? (if applicable) Yes  Which pharmacy would you like this sent to? [facility]  [employer] Rep called requesting refill on Atorvastatin Rx for 90 day supply. Pt had last appt with Dr Nadine Counts on 03/04/20.   Patient notified that their request is being sent to the clinical staff for review and that they should receive a response within 2 business days.

## 2020-03-05 NOTE — Progress Notes (Signed)
Called office - they were already closed will CB Monday

## 2020-03-05 NOTE — Telephone Encounter (Signed)
90 day refill sent to [facility] as requested

## 2020-03-07 DIAGNOSIS — G4733 Obstructive sleep apnea (adult) (pediatric): Secondary | ICD-10-CM | POA: Diagnosis not present

## 2020-03-08 NOTE — Progress Notes (Signed)
Called Dr. Ronal Fear office and made appointment for the patient - April 01, 2020 at 11 AM.  Called and informed the patient of the appointment and faxed over most recent OVS to the office.

## 2020-03-26 ENCOUNTER — Other Ambulatory Visit: Payer: Self-pay | Admitting: Family Medicine

## 2020-03-26 DIAGNOSIS — G8929 Other chronic pain: Secondary | ICD-10-CM

## 2020-03-29 ENCOUNTER — Other Ambulatory Visit: Payer: Self-pay | Admitting: "Endocrinology

## 2020-04-01 DIAGNOSIS — E1140 Type 2 diabetes mellitus with diabetic neuropathy, unspecified: Secondary | ICD-10-CM | POA: Diagnosis not present

## 2020-04-01 DIAGNOSIS — M79673 Pain in unspecified foot: Secondary | ICD-10-CM | POA: Diagnosis not present

## 2020-04-01 DIAGNOSIS — G894 Chronic pain syndrome: Secondary | ICD-10-CM | POA: Diagnosis not present

## 2020-04-01 DIAGNOSIS — M545 Low back pain: Secondary | ICD-10-CM | POA: Diagnosis not present

## 2020-04-01 DIAGNOSIS — M25562 Pain in left knee: Secondary | ICD-10-CM | POA: Diagnosis not present

## 2020-04-04 DIAGNOSIS — J449 Chronic obstructive pulmonary disease, unspecified: Secondary | ICD-10-CM | POA: Diagnosis not present

## 2020-04-06 DIAGNOSIS — G4733 Obstructive sleep apnea (adult) (pediatric): Secondary | ICD-10-CM | POA: Diagnosis not present

## 2020-04-15 ENCOUNTER — Other Ambulatory Visit: Payer: Self-pay | Admitting: Family Medicine

## 2020-04-16 DIAGNOSIS — Z0100 Encounter for examination of eyes and vision without abnormal findings: Secondary | ICD-10-CM | POA: Diagnosis not present

## 2020-04-16 DIAGNOSIS — E119 Type 2 diabetes mellitus without complications: Secondary | ICD-10-CM | POA: Diagnosis not present

## 2020-04-20 ENCOUNTER — Other Ambulatory Visit: Payer: Self-pay | Admitting: Family Medicine

## 2020-04-20 DIAGNOSIS — E1165 Type 2 diabetes mellitus with hyperglycemia: Secondary | ICD-10-CM

## 2020-04-29 ENCOUNTER — Other Ambulatory Visit: Payer: Self-pay | Admitting: Family Medicine

## 2020-04-29 DIAGNOSIS — K047 Periapical abscess without sinus: Secondary | ICD-10-CM

## 2020-04-29 DIAGNOSIS — F3132 Bipolar disorder, current episode depressed, moderate: Secondary | ICD-10-CM

## 2020-05-05 DIAGNOSIS — J449 Chronic obstructive pulmonary disease, unspecified: Secondary | ICD-10-CM | POA: Diagnosis not present

## 2020-05-07 DIAGNOSIS — G4733 Obstructive sleep apnea (adult) (pediatric): Secondary | ICD-10-CM | POA: Diagnosis not present

## 2020-05-31 DIAGNOSIS — Z79891 Long term (current) use of opiate analgesic: Secondary | ICD-10-CM | POA: Diagnosis not present

## 2020-05-31 DIAGNOSIS — E1140 Type 2 diabetes mellitus with diabetic neuropathy, unspecified: Secondary | ICD-10-CM | POA: Diagnosis not present

## 2020-05-31 DIAGNOSIS — M79673 Pain in unspecified foot: Secondary | ICD-10-CM | POA: Diagnosis not present

## 2020-05-31 DIAGNOSIS — M25562 Pain in left knee: Secondary | ICD-10-CM | POA: Diagnosis not present

## 2020-05-31 DIAGNOSIS — M545 Low back pain: Secondary | ICD-10-CM | POA: Diagnosis not present

## 2020-06-05 DIAGNOSIS — J449 Chronic obstructive pulmonary disease, unspecified: Secondary | ICD-10-CM | POA: Diagnosis not present

## 2020-06-09 ENCOUNTER — Other Ambulatory Visit: Payer: Self-pay | Admitting: Family Medicine

## 2020-06-09 DIAGNOSIS — Z794 Long term (current) use of insulin: Secondary | ICD-10-CM

## 2020-06-10 NOTE — Telephone Encounter (Signed)
[place]. NTBS 30 days given 04/20/20 

## 2020-06-10 NOTE — Telephone Encounter (Signed)
Appointment scheduled.

## 2020-06-15 ENCOUNTER — Encounter: Payer: Self-pay | Admitting: Family Medicine

## 2020-06-15 ENCOUNTER — Ambulatory Visit (INDEPENDENT_AMBULATORY_CARE_PROVIDER_SITE_OTHER): Payer: Medicare Other | Admitting: Family Medicine

## 2020-06-15 DIAGNOSIS — N3090 Cystitis, unspecified without hematuria: Secondary | ICD-10-CM

## 2020-06-15 MED ORDER — NITROFURANTOIN MONOHYD MACRO 100 MG PO CAPS: 100.0000 mg | ORAL_CAPSULE | Freq: Two times a day (BID) | ORAL | 0 refills | Status: DC

## 2020-06-15 NOTE — Progress Notes (Signed)
Subjective:    Patient ID: Aimee Phillips, female    DOB: 11-29-1970, 49 y.o.   MRN: [id number]   HPI: Aimee Phillips is a 49 y.o. female presenting for burning with urination. Also frequency. Denies fever. Slight nausea. Back pain is chronic, unchanged.    Depression screen [facility] 2/9 03/04/2020 06/25/2019 03/21/2019 03/06/2019 05/15/2018  Decreased Interest 0 2 2 2 1   Down, Depressed, Hopeless 3 1 3 3 1   PHQ - 2 Score 3 3 5 5 2   Altered sleeping 2 1 3 3 2   Tired, decreased energy 2 3 3 3 2   Change in appetite 2 2 0 - 0  Feeling bad or failure about yourself  1 2 2  0 1  Trouble concentrating 1 1 0 1 1  Moving slowly or fidgety/restless 0 1 0 0 0  Suicidal thoughts 0 1 1 1  0  PHQ-9 Score 11 14 14 13 8   Difficult doing work/chores Somewhat difficult - - Somewhat difficult -  Some recent data might be hidden     Relevant past medical, surgical, family and social history reviewed and updated as indicated.  Interim medical history since our last visit reviewed. Allergies and medications reviewed and updated.  ROS:  Review of Systems  Constitutional: Negative for chills, diaphoresis and fever.  HENT: Negative for congestion.   Eyes: Negative for visual disturbance.  Respiratory: Negative for cough and shortness of breath.   Cardiovascular: Negative for chest pain and palpitations.  Gastrointestinal: Negative for constipation, diarrhea and nausea.  Genitourinary: Positive for dysuria, frequency and urgency. Negative for decreased urine volume, flank pain, hematuria, menstrual problem and pelvic pain.  Musculoskeletal: Negative for arthralgias and joint swelling.  Skin: Negative for rash.  Neurological: Negative for dizziness and numbness.     Social History   Tobacco Use  Smoking Status Never Smoker  Smokeless Tobacco Never Used       Objective:     Wt Readings from Last 3 Encounters:  03/04/20 (!) 350 lb 9.6 oz (159 kg)  11/04/19 (!) 350 lb (158.8 kg)  10/01/19 (!)  350 lb (158.8 kg)     Exam deferred. Pt. Harboring due to COVID 19. Phone visit performed.   Assessment & Plan:   1. Cystitis     Meds ordered this encounter  Medications  . nitrofurantoin, macrocrystal-monohydrate, (MACROBID) 100 MG capsule    Sig: Take 1 capsule (100 mg total) by mouth 2 (two) times daily.    Dispense:  14 capsule    Refill:  0    No orders of the defined types were placed in this encounter.     Diagnoses and all orders for this visit:  Cystitis  Other orders -     nitrofurantoin, macrocrystal-monohydrate, (MACROBID) 100 MG capsule; Take 1 capsule (100 mg total) by mouth 2 (two) times daily.    Virtual Visit via telephone Note  I discussed the limitations, risks, security and privacy concerns of performing an evaluation and management service by telephone and the availability of in person appointments. The patient was identified with two identifiers. Pt.expressed understanding and agreed to proceed. Pt. Is at home. Dr. is in his office.  Follow Up Instructions:   I discussed the assessment and treatment plan with the patient. The patient was provided an opportunity to ask questions and all were answered. The patient agreed with the plan and demonstrated an understanding of the instructions.   The patient was advised to call back or seek  an in-person evaluation if the symptoms worsen or if the condition fails to improve as anticipated.   Total minutes including chart review and phone contact time: 7   Follow up plan: Return if symptoms worsen or fail to improve.  Mechele Claude, MD [place]

## 2020-06-24 ENCOUNTER — Telehealth (INDEPENDENT_AMBULATORY_CARE_PROVIDER_SITE_OTHER): Payer: Self-pay | Admitting: Gastroenterology

## 2020-06-24 ENCOUNTER — Ambulatory Visit (INDEPENDENT_AMBULATORY_CARE_PROVIDER_SITE_OTHER): Payer: Medicare Other | Admitting: Gastroenterology

## 2020-06-24 ENCOUNTER — Encounter (INDEPENDENT_AMBULATORY_CARE_PROVIDER_SITE_OTHER): Payer: Self-pay | Admitting: Gastroenterology

## 2020-06-24 ENCOUNTER — Other Ambulatory Visit: Payer: Self-pay

## 2020-06-24 VITALS — BP 125/83 | HR 105 | Temp 96.9°F | Ht 68.0 in | Wt 338.4 lb

## 2020-06-24 DIAGNOSIS — G8929 Other chronic pain: Secondary | ICD-10-CM

## 2020-06-24 DIAGNOSIS — R1013 Epigastric pain: Secondary | ICD-10-CM

## 2020-06-24 DIAGNOSIS — K5900 Constipation, unspecified: Secondary | ICD-10-CM | POA: Diagnosis not present

## 2020-06-24 DIAGNOSIS — R634 Abnormal weight loss: Secondary | ICD-10-CM

## 2020-06-24 MED ORDER — SUCRALFATE 1 G PO TABS: 1.0000 g | ORAL_TABLET | Freq: Three times a day (TID) | ORAL | 1 refills | Status: DC

## 2020-06-24 NOTE — Progress Notes (Signed)
Patient profile: Aimee Phillips is a 49 y.o. female seen for evaluation of abd pain. Last seen 09/2019 for abd pain, was given Rx for Carafate and Levsin at that [place].   History of Present Illness: Aimee Phillips is seen today for follow-up.  She reports worsening symptoms since her last visit in January.  At that visit she was found to have an elevated white count a CT was ordered but this was never completed.  She reports continuing symptoms, describes epigastric pain that worsens with any oral intake including water.  Describes burning stabbing pain in epigastric area.  She has some nausea with the discomfort but no vomiting.  She has no GERD symptoms on her omeprazole 40 mg twice a day.  Described as a nagging pain but also endorses early satiety and states "dreads eating sometimes" due to pain.  She has lost weight due to the pain. Both carafate and levsin seem to help pain some.   Chronic history of constipation since fractured back a few years ago, takes a stool softener twice a day and typically goes every day or every other day.  Bowel movement does not improve epigastric pain.  Denies any blood in stool. No diarrhea.  She wears oxygen 3 L-she is not wearing in clinic but reports wears at home routinely.  Wt Readings from Last 3 Encounters:  06/24/20 (!) 338 lb 6.4 oz (153.5 kg)  03/04/20 (!) 350 lb 9.6 oz (159 kg)  11/04/19 (!) 350 lb (158.8 kg)     Last Colonoscopy:  None prior  Last Endoscopy:  None prior    Past Medical History:  Past Medical History:  Diagnosis Date  . Asthma   . Bipolar affect, depressed ([place])   . Cellulitis   . COPD (chronic obstructive pulmonary disease) ([place])   . Depression   . Diabetes mellitus without complication ([place])   . PE (pulmonary embolism)   . Pulmonary edema     Problem List: Patient Active Problem List   Diagnosis Date Noted  . Essential hypertension, benign 11/04/2019  . Mixed hyperlipidemia 11/04/2019  . Neuropathy 01/02/2019    . Lower abdominal pain 05/15/2018  . Cystocele with prolapse 05/15/2018  . Uncontrolled type 2 diabetes mellitus with hyperglycemia ([place]) 03/12/2018  . 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 Chronic obstructive pulmonary disease ([place]) 03/05/2018  . Cyst of left ovary 01/18/2018  . Pelvic pain 01/18/2018  . Chronic right-sided low back pain 04/20/2017  . At high risk for falls 04/20/2017  . Muscle weakness 04/20/2017  . Injury of back 02/02/2017  . Chronic left-sided low back pain with left-sided sciatica 02/02/2017  . Deformity of toenail 02/02/2017  . Hyperlipidemia associated with type 2 diabetes mellitus ([place]) 02/02/2017  . Well adult exam 02/02/2017  . Morbid obesity ([place]) 08/01/2011  . Obesity hypoventilation syndrome ([place]) 08/01/2011  . Bipolar affect, depressed ([place])     Past Surgical History: Past Surgical History:  Procedure Laterality Date  . CESAREAN SECTION    . CHOLECYSTECTOMY      Allergies: No Known Allergies    Home Medications:  Current Outpatient Medications:  .  albuterol (PROVENTIL HFA;VENTOLIN HFA) 108 (90 Base) MCG/ACT inhaler, Inhale 2 puffs into the lungs every 6 (six) hours as needed for wheezing or shortness of breath., Disp: 1 Inhaler, Rfl: 0 .  atorvastatin (LIPITOR) 20 MG tablet, TAKE ONE TABLET BY MOUTH DAILY AT 6 PM., Disp: 90 tablet, Rfl: 0 .  blood glucose  meter kit and supplies, Check BS up to four times a day, Disp: 1 each, Rfl: 0 .  Blood Glucose Monitoring Suppl DEVI, Check bs up to four times a day, Disp: 1 each, Rfl: 0 .  budesonide-formoterol (SYMBICORT) 160-4.5 MCG/ACT inhaler, Inhale 2 puffs into the lungs 2 (two) times daily., Disp: 10.2 g, Rfl: 11 .  cyclobenzaprine (FLEXERIL) 10 MG tablet, Take 1 tablet (10 mg total) by mouth 3 (three) times daily as needed for muscle spasms., Disp: 90 tablet, Rfl: 5 .  FLUoxetine (PROZAC) 20 MG capsule, TAKE THREE (3) CAPSULES BY MOUTH DAILY., Disp: 90 capsule, Rfl: 5 .   furosemide (LASIX) 40 MG tablet, Take 1 tablet (40 mg total) by mouth 2 (two) times daily., Disp: 60 tablet, Rfl: 11 .  gabapentin (NEURONTIN) 400 MG capsule, Take 1 capsule (400 mg total) by mouth 3 (three) times daily., Disp: 90 capsule, Rfl: 3 .  glucose blood (ACCU-CHEK AVIVA) test strip, Use to check blood sugar four times daily, Disp: 200 each, Rfl: 12 .  hyoscyamine (LEVSIN SL) 0.125 MG SL tablet, PLACE ONE TABLET UNDER THE TONGUE EVERY 6 HOURS AS NEEDED FOR ABDOMINAL PAIN, Disp: 120 tablet, Rfl: 2 .  ibuprofen (ADVIL) 800 MG tablet, TAKE ONE TABLET BY MOUTH EVERY 8 HOURS AS NEEDED FOR MODERATE PAIN, Disp: 90 tablet, Rfl: 0 .  Insulin Glargine (LANTUS SOLOSTAR) 100 UNIT/ML Solostar Pen, Inject 80 Units into the skin at bedtime., Disp: 10 pen, Rfl: 2 .  Insulin Lispro (HUMALOG KWIKPEN) 200 UNIT/ML SOPN, Inject 10-16 Units into the skin 3 (three) times daily before meals., Disp: 10 pen, Rfl: 2 .  Lancet Devices MISC, Test QID, DX E11.65, Aviva Plus, Disp: 100 each, Rfl: prn .  lisinopril (ZESTRIL) 5 MG tablet, Take 1 tablet (5 mg total) by mouth daily., Disp: 90 tablet, Rfl: 0 .  nitrofurantoin, macrocrystal-monohydrate, (MACROBID) 100 MG capsule, Take 1 capsule (100 mg total) by mouth 2 (two) times daily., Disp: 14 capsule, Rfl: 0 .  OLANZapine (ZYPREXA) 15 MG tablet, Take 1 tablet (15 mg total) by mouth at bedtime., Disp: 90 tablet, Rfl: 0 .  omeprazole (PRILOSEC) 20 MG capsule, Take one tablet by mouth daily, Disp: 90 capsule, Rfl: 1 .  potassium chloride (KLOR-CON) 10 MEQ tablet, TAKE ONE TABLET BY MOUTH TWICE DAILY., Disp: 60 tablet, Rfl: 2 .  solifenacin (VESICARE) 10 MG tablet, TAKE ONE TABLET BY MOUTH DAILY., Disp: 30 tablet, Rfl: 11 .  SPIRIVA HANDIHALER 18 MCG inhalation capsule, INHALE ONE PUFF BY MOUTH DAILY., Disp: 30 capsule, Rfl: 12 .  sucralfate (CARAFATE) 1 g tablet, Take 1 tablet (1 g total) by mouth 2 (two) times daily before a meal., Disp: 60 tablet, Rfl: 3 .  TRUEPLUS PEN  NEEDLES 31G X 5 MM MISC, USE WITH INSULIN FOUR TIMES DAILY., Disp: 100 each, Rfl: 5 .  sitaGLIPtin (JANUVIA) 100 MG tablet, Take 1 tablet (100 mg total) by mouth daily. (Needs to be seen before next refill) (Patient not taking: Reported on 06/24/2020), Disp: 30 tablet, Rfl: 0   Family History: family history includes Cancer in her mother and sister; Diabetes in her brother; Heart disease in her sister; Heart failure in her father.   Mother - breast cancer  Older sister - brain cancer Denies family hx colon cancer   Social History:   reports that she has never smoked. She has never used smokeless tobacco. She reports that she does not drink alcohol and does not use drugs.   Review of  Systems: Constitutional: + weight loss Eyes: No changes in vision. ENT: No oral lesions, sore throat.  GI: see HPI.  Heme/Lymph: No easy bruising.  CV: No chest pain.  GU: No hematuria.  Integumentary: No rashes.  Neuro: No headaches.  Psych: No depression/anxiety.  Endocrine: No heat/cold intolerance.  Allergic/Immunologic: No urticaria.  Resp: No cough, SOB.  Musculoskeletal: No joint swelling.    Physical Examination: BP 125/83 (BP Location: Left Arm, Patient Position: Sitting, Cuff Size: Normal)   Pulse (!) 105   Temp (!) 96.9 F (36.1 C) (Temporal)   Ht $R'5\' 8"'xG$  (1.727 m)   Wt (!) 338 lb 6.4 oz (153.5 kg)   BMI 51.45 kg/m  Gen: NAD, alert and oriented x 4, obese, in wheelchair.  HEENT: PEERLA, EOMI, Neck: supple, no JVD Chest: CTA bilaterally, no wheezes, crackles, or other adventitious sounds CV: RRR, no m/g/c/r Abd: soft, + TTP Epigastric area, ND, +BS in all four quadrants; no HSM, guarding, ridigity, or rebound tenderness Ext: no edema, well perfused with 2+ pulses, Skin: no rash or lesions noted on observed skin Lymph: no noted LAD  Data:  10/07/19-Lipase normal, CBC w/ WBC 13.5, CMP w/ glucose 284, Cl 97, ALT 30   Assessment/Plan: Ms. Marsan is a 49 y.o. female  Jannel was seen  today for abdominal pain.  Diagnoses and all orders for this visit:  Abdominal pain, chronic, epigastric -     CBC with Differential -     COMPLETE METABOLIC PANEL WITH GFR -     Lipase -     TSH + free T4  Loss of weight -     CBC with Differential -     COMPLETE METABOLIC PANEL WITH GFR -     Lipase -     TSH + free T4     1. Epigastric pain pain -  chronic, using advil $RemoveBef'800mg'NbAAIHlEhV$  3 times a day and do have concern for peptic ulcer disease.  Differential would also include gastroparesis as she has diabetes with last a1c 9.9.  She has never had an endoscopy. We will check her basic labs today and schedule endoscopy for evaluation.  She had some benefit w/ Carafate and can increase from twice daily to 4 times daily.  If endoscopy negative would recommend GES and trial of Reglan.  She denies prior issues with sedation but she is high risk given her baseline oxygen requirement of 3 L. Discussed in detail.   No lower GI sx except mild chronic constipation likely related to polypharmacy. Can address in future when epigastric pain improves  Patient denies CP, SOB, and use of blood thinners. I discussed the risks and benefits of procedure including bleeding, perforation, infection, missed lesions, medication reactions and possible hospitalization or surgery if complications. All questions answered.    I personally performed the service, non-incident to. (WP)  Laurine Blazer, [facility]

## 2020-06-24 NOTE — Telephone Encounter (Signed)
Please notify patient that we are not starting a new medication-we are simply increasing her Carafate from twice a day to 4 times a day but it is the same dose.  She can go ahead and use up tablets she has and I have sent a new prescription to [facility] for her to start when she completes current prescription.

## 2020-06-24 NOTE — Telephone Encounter (Signed)
Patient left voice mail message wanting to know when her prescription would be sent to pharmacy - ph# [phone]

## 2020-06-24 NOTE — Patient Instructions (Signed)
We are checking labs and scheduling endoscopy for evaluation.  You can increase Carafate to 4 times a day if this helps symptoms-before each meal and at bedtime.  I will send a refill to your pharmacy.

## 2020-06-25 DIAGNOSIS — G8929 Other chronic pain: Secondary | ICD-10-CM | POA: Insufficient documentation

## 2020-06-25 DIAGNOSIS — K5900 Constipation, unspecified: Secondary | ICD-10-CM | POA: Insufficient documentation

## 2020-06-25 DIAGNOSIS — R1013 Epigastric pain: Secondary | ICD-10-CM | POA: Insufficient documentation

## 2020-06-25 LAB — COMPLETE METABOLIC PANEL WITH GFR
AG Ratio: 1.3 (calc) (ref 1.0–2.5)
ALT: 45 U/L — ABNORMAL HIGH (ref 6–29)
AST: 55 U/L — ABNORMAL HIGH (ref 10–35)
Albumin: 4.1 g/dL (ref 3.6–5.1)
Alkaline phosphatase (APISO): 78 U/L (ref 31–125)
BUN: 11 mg/dL (ref 7–25)
CO2: 26 mmol/L (ref 20–32)
Calcium: 9.6 mg/dL (ref 8.6–10.2)
Chloride: 94 mmol/L — ABNORMAL LOW (ref 98–110)
Creat: 0.63 mg/dL (ref 0.50–1.10)
GFR, Est African American: 123 mL/min/{1.73_m2} (ref >=60)
GFR, Est Non African American: 106 mL/min/{1.73_m2} (ref >=60)
Globulin: 3.1 g/dL (calc) (ref 1.9–3.7)
Glucose, Bld: 312 mg/dL — ABNORMAL HIGH (ref 65–139)
Potassium: 4.3 mmol/L (ref 3.5–5.3)
Sodium: 132 mmol/L — ABNORMAL LOW (ref 135–146)
Total Bilirubin: 0.3 mg/dL (ref 0.2–1.2)
Total Protein: 7.2 g/dL (ref 6.1–8.1)

## 2020-06-25 LAB — CBC WITH DIFFERENTIAL/PLATELET
Absolute Monocytes: 600 cells/uL (ref 200–950)
Basophils Absolute: 61 cells/uL (ref 0–200)
Basophils Relative: 0.7 %
Eosinophils Absolute: 87 cells/uL (ref 15–500)
Eosinophils Relative: 1 %
HCT: 50.1 % — ABNORMAL HIGH (ref 35.0–45.0)
Hemoglobin: 15.8 g/dL — ABNORMAL HIGH (ref 11.7–15.5)
Lymphs Abs: 3350 cells/uL (ref 850–3900)
MCH: 28.2 pg (ref 27.0–33.0)
MCHC: 31.5 g/dL — ABNORMAL LOW (ref 32.0–36.0)
MCV: 89.3 fL (ref 80.0–100.0)
MPV: 13.5 fL — ABNORMAL HIGH (ref 7.5–12.5)
Monocytes Relative: 6.9 %
Neutro Abs: 4602 cells/uL (ref 1500–7800)
Neutrophils Relative %: 52.9 %
Platelets: 239 10*3/uL (ref 140–400)
RBC: 5.61 10*6/uL — ABNORMAL HIGH (ref 3.80–5.10)
RDW: 11.9 % (ref 11.0–15.0)
Total Lymphocyte: 38.5 %
WBC: 8.7 10*3/uL (ref 3.8–10.8)

## 2020-06-25 LAB — LIPASE: Lipase: 48 U/L (ref 7–60)

## 2020-06-25 LAB — TSH+FREE T4: TSH W/REFLEX TO FT4: 1.29 mIU/L

## 2020-06-25 NOTE — Telephone Encounter (Signed)
Aimee Phillips is aware of the increase of the carafate to 4 x's daily

## 2020-06-28 ENCOUNTER — Ambulatory Visit: Payer: Medicare Other | Admitting: Family Medicine

## 2020-06-28 DIAGNOSIS — Z79891 Long term (current) use of opiate analgesic: Secondary | ICD-10-CM | POA: Diagnosis not present

## 2020-06-28 DIAGNOSIS — M25562 Pain in left knee: Secondary | ICD-10-CM | POA: Diagnosis not present

## 2020-06-28 DIAGNOSIS — E1140 Type 2 diabetes mellitus with diabetic neuropathy, unspecified: Secondary | ICD-10-CM | POA: Diagnosis not present

## 2020-06-28 DIAGNOSIS — M545 Low back pain, unspecified: Secondary | ICD-10-CM | POA: Diagnosis not present

## 2020-06-28 DIAGNOSIS — M79673 Pain in unspecified foot: Secondary | ICD-10-CM | POA: Diagnosis not present

## 2020-06-30 ENCOUNTER — Encounter: Payer: Self-pay | Admitting: Family Medicine

## 2020-07-02 ENCOUNTER — Other Ambulatory Visit (INDEPENDENT_AMBULATORY_CARE_PROVIDER_SITE_OTHER): Payer: Self-pay

## 2020-07-05 DIAGNOSIS — J449 Chronic obstructive pulmonary disease, unspecified: Secondary | ICD-10-CM | POA: Diagnosis not present

## 2020-07-26 ENCOUNTER — Ambulatory Visit (INDEPENDENT_AMBULATORY_CARE_PROVIDER_SITE_OTHER): Payer: Medicare Other | Admitting: Gastroenterology

## 2020-07-28 ENCOUNTER — Ambulatory Visit (INDEPENDENT_AMBULATORY_CARE_PROVIDER_SITE_OTHER): Payer: Medicare Other | Admitting: Family Medicine

## 2020-07-28 ENCOUNTER — Other Ambulatory Visit: Payer: Self-pay

## 2020-07-28 ENCOUNTER — Encounter: Payer: Self-pay | Admitting: Family Medicine

## 2020-07-28 VITALS — BP 134/87 | HR 103 | Temp 97.3°F | Resp 94

## 2020-07-28 DIAGNOSIS — E785 Hyperlipidemia, unspecified: Secondary | ICD-10-CM

## 2020-07-28 DIAGNOSIS — Z23 Encounter for immunization: Secondary | ICD-10-CM

## 2020-07-28 DIAGNOSIS — E1159 Type 2 diabetes mellitus with other circulatory complications: Secondary | ICD-10-CM

## 2020-07-28 DIAGNOSIS — M79673 Pain in unspecified foot: Secondary | ICD-10-CM

## 2020-07-28 DIAGNOSIS — G8929 Other chronic pain: Secondary | ICD-10-CM

## 2020-07-28 DIAGNOSIS — E1165 Type 2 diabetes mellitus with hyperglycemia: Secondary | ICD-10-CM

## 2020-07-28 DIAGNOSIS — Z789 Other specified health status: Secondary | ICD-10-CM

## 2020-07-28 DIAGNOSIS — E1169 Type 2 diabetes mellitus with other specified complication: Secondary | ICD-10-CM | POA: Diagnosis not present

## 2020-07-28 DIAGNOSIS — M5442 Lumbago with sciatica, left side: Secondary | ICD-10-CM

## 2020-07-28 DIAGNOSIS — E1065 Type 1 diabetes mellitus with hyperglycemia: Secondary | ICD-10-CM | POA: Diagnosis not present

## 2020-07-28 DIAGNOSIS — Z794 Long term (current) use of insulin: Secondary | ICD-10-CM

## 2020-07-28 DIAGNOSIS — F3132 Bipolar disorder, current episode depressed, moderate: Secondary | ICD-10-CM

## 2020-07-28 DIAGNOSIS — Z7409 Other reduced mobility: Secondary | ICD-10-CM

## 2020-07-28 DIAGNOSIS — I152 Hypertension secondary to endocrine disorders: Secondary | ICD-10-CM

## 2020-07-28 LAB — BAYER DCA HB A1C WAIVED: HB A1C (BAYER DCA - WAIVED): 11.7 % — ABNORMAL HIGH (ref <7.0)

## 2020-07-28 MED ORDER — BUPROPION HCL ER (SR) 150 MG PO TB12: 150.0000 mg | ORAL_TABLET | Freq: Every day | ORAL | 0 refills | Status: DC

## 2020-07-28 MED ORDER — IBUPROFEN 800 MG PO TABS: ORAL_TABLET | ORAL | 2 refills | Status: DC

## 2020-07-28 MED ORDER — LISINOPRIL 5 MG PO TABS: 5.0000 mg | ORAL_TABLET | Freq: Every day | ORAL | 3 refills | Status: DC

## 2020-07-28 MED ORDER — SITAGLIPTIN PHOSPHATE 100 MG PO TABS: 100.0000 mg | ORAL_TABLET | Freq: Every day | ORAL | 3 refills | Status: DC

## 2020-07-28 MED ORDER — CYCLOBENZAPRINE HCL 10 MG PO TABS: 10.0000 mg | ORAL_TABLET | Freq: Three times a day (TID) | ORAL | 5 refills | Status: DC | PRN

## 2020-07-28 NOTE — Progress Notes (Addendum)
Subjective: CC: DM PCP: [name], DO OMB:TDHRCB Aimee Phillips is a 48 y.o. female presenting to clinic today for:  1. Type 2 Diabetes with hypertension, hyperlipidemia:  High at home: 500s; Low at home: no hypoglycemia, Taking medication(s): 80 units of Lantus, 40 units of Humalog, Januvia 100 mg (has been out for the last 2 months), lisinopril 5 mg and atorvastatin 20 mg daily; she has essentially stopped following up with Dr. Dorris Fetch.  Last eye exam: Needs Last foot exam: Needs Last A1c:  Lab Results  Component Value Date   HGBA1C 9.9 (A) 11/04/2019   Nephropathy screen indicated?:  On ACE inhibitor Last flu, zoster and/or pneumovax:  Immunization History  Administered Date(s) Administered  . Influenza Inj Mdck Quad Pf 08/31/2018  . Influenza Split 08/11/2018  . Influenza-Unspecified 08/31/2018  . Pneumococcal Polysaccharide-23 12/03/2019  . Tdap 02/14/2011    ROS: No chest pain, shortness of breath.  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 Merlene Laughter recently.  2.  Depressive disorder Patient reports uncontrolled depression since switching from Prozac to Cymbalta.  She would like to add something else to help.  She reports poor motivation, anhedonia and feelings of sadness  ROS: Per HPI  No Known Allergies Past Medical History:  Diagnosis Date  . Asthma   . Bipolar affect, depressed ([place])   . Cellulitis   . COPD (chronic obstructive pulmonary disease) ([place])   . Depression   . Diabetes mellitus without complication ([place])   . PE (pulmonary embolism)   . Pulmonary edema     Current Outpatient Medications:  .  albuterol (PROVENTIL HFA;VENTOLIN HFA) 108 (90 Base) MCG/ACT inhaler, Inhale 2 puffs into the lungs every 6 (six) hours as needed for wheezing or shortness of breath., Disp: 1 Inhaler, Rfl: 0 .  atorvastatin (LIPITOR) 20 MG tablet, TAKE ONE TABLET BY MOUTH DAILY AT 6 PM., Disp: 90 tablet, Rfl: 0 .  blood  glucose meter kit and supplies, Check BS up to four times a day, Disp: 1 each, Rfl: 0 .  Blood Glucose Monitoring Suppl DEVI, Check bs up to four times a day, Disp: 1 each, Rfl: 0 .  budesonide-formoterol (SYMBICORT) 160-4.5 MCG/ACT inhaler, Inhale 2 puffs into the lungs 2 (two) times daily., Disp: 10.2 g, Rfl: 11 .  cyclobenzaprine (FLEXERIL) 10 MG tablet, Take 1 tablet (10 mg total) by mouth 3 (three) times daily as needed for muscle spasms., Disp: 90 tablet, Rfl: 5 .  DULoxetine (CYMBALTA) 60 MG capsule, duloxetine 60 mg capsule,delayed release  Take 1 capsule every day by oral route., Disp: , Rfl:  .  furosemide (LASIX) 40 MG tablet, Take 1 tablet (40 mg total) by mouth 2 (two) times daily., Disp: 60 tablet, Rfl: 11 .  gabapentin (NEURONTIN) 600 MG tablet, gabapentin 600 mg tablet  Take 1 tablet 3 times a day by oral route., Disp: , Rfl:  .  glucose blood (ACCU-CHEK AVIVA) test strip, Use to check blood sugar four times daily, Disp: 200 each, Rfl: 12 .  HYDROcodone-acetaminophen (NORCO) 7.5-325 MG tablet, hydrocodone 7.5 mg-acetaminophen 325 mg tablet  Take 1 tablet 3 times a day by oral route as needed., Disp: , Rfl:  .  hyoscyamine (LEVSIN SL) 0.125 MG SL tablet, PLACE ONE TABLET UNDER THE TONGUE EVERY 6 HOURS AS NEEDED FOR ABDOMINAL PAIN, Disp: 120 tablet, Rfl: 2 .  ibuprofen (ADVIL) 800 MG tablet, TAKE ONE TABLET BY MOUTH EVERY 8 HOURS AS NEEDED FOR  MODERATE PAIN, Disp: 90 tablet, Rfl: 2 .  Insulin Glargine (LANTUS SOLOSTAR) 100 UNIT/ML Solostar Pen, Inject 80 Units into the skin at bedtime., Disp: 10 pen, Rfl: 2 .  Insulin Lispro (HUMALOG KWIKPEN) 200 UNIT/ML SOPN, Inject 10-16 Units into the skin 3 (three) times daily before meals., Disp: 10 pen, Rfl: 2 .  Lancet Devices MISC, Test QID, DX E11.65, Aviva Plus, Disp: 100 each, Rfl: prn .  lisinopril (ZESTRIL) 5 MG tablet, Take 1 tablet (5 mg total) by mouth daily., Disp: 90 tablet, Rfl: 3 .  OLANZapine (ZYPREXA) 15 MG tablet, Take 1 tablet (15  mg total) by mouth at bedtime., Disp: 90 tablet, Rfl: 0 .  omeprazole (PRILOSEC) 20 MG capsule, Take one tablet by mouth daily, Disp: 90 capsule, Rfl: 1 .  potassium chloride (KLOR-CON) 10 MEQ tablet, TAKE ONE TABLET BY MOUTH TWICE DAILY., Disp: 60 tablet, Rfl: 2 .  sitaGLIPtin (JANUVIA) 100 MG tablet, Take 1 tablet (100 mg total) by mouth daily., Disp: 90 tablet, Rfl: 3 .  solifenacin (VESICARE) 10 MG tablet, TAKE ONE TABLET BY MOUTH DAILY., Disp: 30 tablet, Rfl: 11 .  SPIRIVA HANDIHALER 18 MCG inhalation capsule, INHALE ONE PUFF BY MOUTH DAILY., Disp: 30 capsule, Rfl: 12 .  sucralfate (CARAFATE) 1 g tablet, Take 1 tablet (1 g total) by mouth 4 (four) times daily -  with meals and at bedtime., Disp: 120 tablet, Rfl: 1 .  TRUEPLUS PEN NEEDLES 31G X 5 MM MISC, USE WITH INSULIN FOUR TIMES DAILY., Disp: 100 each, Rfl: 5 .  buPROPion (WELLBUTRIN SR) 150 MG 12 hr tablet, Take 1 tablet (150 mg total) by mouth daily., Disp: 90 tablet, Rfl: 0 Social History   Socioeconomic History  . Marital status: Married    Spouse name: Not on file  . Number of children: 3  . Years of education: Not on file  . Highest education level: High school graduate  Occupational History  . Occupation: Disabled  Tobacco Use  . Smoking status: Never Smoker  . Smokeless tobacco: Never Used  Vaping Use  . Vaping Use: Never used  Substance and Sexual Activity  . Alcohol use: No  . Drug use: No  . Sexual activity: Not Currently  Other Topics Concern  . Not on file  Social History Narrative  . Not on file   Social Determinants of Health   Financial Resource Strain:   . Difficulty of Paying Living Expenses: Not on file  Food Insecurity:   . Worried About Charity fundraiser in the Last Year: Not on file  . Ran Out of Food in the Last Year: Not on file  Transportation Needs:   . Lack of Transportation (Medical): Not on file  . Lack of Transportation (Non-Medical): Not on file  Physical Activity:   . Days of  Exercise per Week: Not on file  . Minutes of Exercise per Session: Not on file  Stress:   . Feeling of Stress : Not on file  Social Connections:   . Frequency of Communication with Friends and Family: Not on file  . Frequency of Social Gatherings with Friends and Family: Not on file  . Attends Religious Services: Not on file  . Active Member of Clubs or Organizations: Not on file  . Attends Archivist Meetings: Not on file  . Marital Status: Not on file  Intimate Partner Violence:   . Fear of Current or Ex-Partner: Not on file  . Emotionally Abused: Not on file  .  Physically Abused: Not on file  . Sexually Abused: Not on file   Family History  Problem Relation Age of Onset  . Cancer Mother        breast  . Heart failure Father        died of AMI  . Cancer Sister   . Diabetes Brother   . Heart disease Sister     Objective: Office vital signs reviewed. BP 134/87   Pulse (!) 103   Temp (!) 97.3 F (36.3 C) (Temporal)   Resp (!) 94   Physical Examination:  General: Awake, alert, morbidly obese, No acute distress HEENT: Normal, sclera white Cardio: regular rate and rhythm, S1S2 heard, no murmurs appreciated Pulm: clear to auscultation bilaterally, no wheezes, rhonchi or rales; normal work of breathing on room air MSK: arrives in wheelchair. Chronic pain out of proportion to exam Neuro: see dm foot  Diabetic Foot Exam - Simple   Simple Foot Form Diabetic Foot exam was performed with the following findings: Yes 07/28/2020  3:49 PM  Visual Inspection See comments: Yes Sensation Testing See comments: Yes Pulse Check Posterior Tibialis and Dorsalis pulse intact bilaterally: Yes Comments Decreased monofilament sensation throughout.  No ulcerations.    Depression screen [facility] 2/9 07/28/2020 03/04/2020 06/25/2019  Decreased Interest 3 0 2  Down, Depressed, Hopeless _0 PHQ - 2 Score _1 Altered sleeping _2 Tired, decreased energy _3 Change in  appetite _4 Feeling bad or failure about yourself  _5 Trouble concentrating 0 1 1  Moving slowly or fidgety/restless 0 0 1  Suicidal thoughts 0 0 1  PHQ-9 Score _6 Difficult doing work/chores - Somewhat difficult -  Some recent data might be hidden    Assessment/ Plan: 49 y.o. female   Type 2 diabetes mellitus with hyperglycemia, with long-term current use of insulin ([place]) - Plan: Bayer DCA Hb A1c Waived, Amb Referral to Bariatric Surgery, sitaGLIPtin (JANUVIA) 100 MG tablet  Hyperlipidemia associated with type 1 diabetes mellitus ([place]) - Plan: Amb Referral to Bariatric Surgery  Hypertension associated with diabetes ([place]) - Plan: lisinopril (ZESTRIL) 5 MG tablet  Morbid obesity ([place]) - Plan: Amb Referral to Bariatric Surgery, Misc. Devices (TRANSPORT CHAIR) MISC  Chronic foot pain, unspecified laterality - Plan: Ambulatory referral to Podiatry, ibuprofen (ADVIL) 800 MG tablet  Chronic left-sided low back pain with left-sided sciatica - Plan: cyclobenzaprine (FLEXERIL) 10 MG tablet, ibuprofen (ADVIL) 800 MG tablet, Misc. Devices (TRANSPORT CHAIR) MISC  Bipolar affective disorder, currently depressed, moderate ([place])  Need for immunization against influenza - Plan: Flu Vaccine QUAD 36+ mos IM  Impaired mobility and activities of daily living - Plan: Misc. Devices (TRANSPORT CHAIR) MISC  Not controlled.  A1c over 11.  For some reason she is not willing to go back to Dr. Dorris Fetch so we will see what we can do for her here.  I would like her to see Aimee Phillips in the next week for [facility] placement.  She should be a candidate given multiple blood sugar checks and injections.  I think that she would likely benefit from something like Trulicity or Ozempic.  However, her GI issues may be limiting  Referral to bariatric surgery for consideration of surgical intervention for morbid obesity  Referral to podiatry for ongoing foot pain and to evaluated right great toe nail lesion, which has  been present for >68mnow.  Wellbutrin 150 mg added  daily to her current regimen given uncontrolled depression  Orders Placed This Encounter  Procedures  . Flu Vaccine QUAD 36+ mos IM  . Bayer DCA Hb A1c Waived  . Amb Referral to Bariatric Surgery    Referral Priority:   Routine    Referral Type:   Consultation    Number of Visits Requested:   1  . Ambulatory referral to Podiatry    Referral Priority:   Routine    Referral Type:   Consultation    Referral Reason:   Specialty Services Required    Requested Specialty:   Podiatry    Number of Visits Requested:   1   Meds ordered this encounter  Medications  . cyclobenzaprine (FLEXERIL) 10 MG tablet    Sig: Take 1 tablet (10 mg total) by mouth 3 (three) times daily as needed for muscle spasms.    Dispense:  90 tablet    Refill:  5  . ibuprofen (ADVIL) 800 MG tablet    Sig: TAKE ONE TABLET BY MOUTH EVERY 8 HOURS AS NEEDED FOR MODERATE PAIN    Dispense:  90 tablet    Refill:  2  . lisinopril (ZESTRIL) 5 MG tablet    Sig: Take 1 tablet (5 mg total) by mouth daily.    Dispense:  90 tablet    Refill:  3  . sitaGLIPtin (JANUVIA) 100 MG tablet    Sig: Take 1 tablet (100 mg total) by mouth daily.    Dispense:  90 tablet    Refill:  3  . buPROPion (WELLBUTRIN SR) 150 MG 12 hr tablet    Sig: Take 1 tablet (150 mg total) by mouth daily.    Dispense:  90 tablet    Refill:  0  . Misc. Devices (TRANSPORT CHAIR) MISC    Sig: Needs lift chair for >300lbs    Dispense:  1 each    Refill:  [place], [place] [phone]

## 2020-07-30 ENCOUNTER — Encounter (INDEPENDENT_AMBULATORY_CARE_PROVIDER_SITE_OTHER): Payer: Self-pay

## 2020-07-30 ENCOUNTER — Other Ambulatory Visit (INDEPENDENT_AMBULATORY_CARE_PROVIDER_SITE_OTHER): Payer: Self-pay

## 2020-08-02 ENCOUNTER — Telehealth: Payer: Self-pay

## 2020-08-02 ENCOUNTER — Encounter: Payer: Self-pay | Admitting: Family Medicine

## 2020-08-02 NOTE — Telephone Encounter (Signed)
Patient was last seen 07/28/20- please advise

## 2020-08-03 ENCOUNTER — Ambulatory Visit: Payer: Medicare Other | Admitting: Pharmacist

## 2020-08-03 MED ORDER — TRANSPORT CHAIR MISC: 0 refills | Status: AC

## 2020-08-03 NOTE — Telephone Encounter (Signed)
Please ask where she wants rx sent and let Adella Hare know.

## 2020-08-03 NOTE — Addendum Note (Signed)
Addended by: Raliegh Ip on: 08/03/2020 07:46 AM   Modules accepted: Orders

## 2020-08-03 NOTE — Telephone Encounter (Signed)
Contacted patient.  Patient states that she would like Rx for lift chair sent to [facility] [phone].  Patient also states that she does not have a ride to appointment today with Clinical pharmacist and requested it be canceled, she will call back to reschedule

## 2020-08-05 DIAGNOSIS — J449 Chronic obstructive pulmonary disease, unspecified: Secondary | ICD-10-CM | POA: Diagnosis not present

## 2020-08-13 ENCOUNTER — Other Ambulatory Visit: Payer: Self-pay | Admitting: Obstetrics & Gynecology

## 2020-08-13 NOTE — Patient Instructions (Signed)
Aimee Phillips  08/13/2020     @PREFPERIOPPHARMACY @   Your procedure is scheduled on  08/20/2020.  Report to 14/06/2020 at  0915  A.M.  Call this number if you have problems the morning of surgery:  [phone]   Remember:  Follow the diet instructions given to you by the office.                       Take these medicines the morning of surgery with A SIP OF WATER  Wellbutrin, flexeril(if needed), cymbalta, gabapentin, hydrocodone(if needed), prilosec. Use your inhalers before you come. Take 40 units of your Lantus the night before your procedure. DO NOT take any medications for diabetes the morning of your procedure.    Do not wear jewelry, make-up or nail polish.  Do not wear lotions, powders, or perfumes. Please wear deodorant and brush your teeth.  Do not shave 48 hours prior to surgery.  Men may shave face and neck.  Do not bring valuables to the hospital.  [facility] is not responsible for any belongings or valuables.  Contacts, dentures or bridgework may not be worn into surgery.  Leave your suitcase in the car.  After surgery it may be brought to your room.  For patients admitted to the hospital, discharge time will be determined by your treatment team.  Patients discharged the day of surgery will not be allowed to drive home.   Name and phone number of your driver:   family Special instructions:  DO NOT smoke the morning of your procedure,  Please read over the following fact sheets that you were given. Anesthesia Post-op Instructions and Care and Recovery After Surgery       Upper Endoscopy, Adult, Care After This sheet gives you information about how to care for yourself after your procedure. Your health care provider may also give you more specific instructions. If you have problems or questions, contact your health care provider. What can I expect after the procedure? After the procedure, it is common to have:  A sore throat.  Mild stomach pain  or discomfort.  Bloating.  Nausea. Follow these instructions at home:   Follow instructions from your health care provider about what to eat or drink after your procedure.  Return to your normal activities as told by your health care provider. Ask your health care provider what activities are safe for you.  Take over-the-counter and prescription medicines only as told by your health care provider.  Do not drive for 24 hours if you were given a sedative during your procedure.  Keep all follow-up visits as told by your health care provider. This is important. Contact a health care provider if you have:  A sore throat that lasts longer than one day.  Trouble swallowing. Get help right away if:  You vomit blood or your vomit looks like coffee grounds.  You have: ? A fever. ? Bloody, black, or tarry stools. ? A severe sore throat or you cannot swallow. ? Difficulty breathing. ? Severe pain in your chest or abdomen. Summary  After the procedure, it is common to have a sore throat, mild stomach discomfort, bloating, and nausea.  Do not drive for 24 hours if you were given a sedative during the procedure.  Follow instructions from your health care provider about what to eat or drink after your procedure.  Return to your normal activities as told by your health care  provider. This information is not intended to replace advice given to you by your health care provider. Make sure you discuss any questions you have with your health care provider. Document Revised: 02/19/2018 Document Reviewed: 01/28/2018 Elsevier Patient Education  2020 Elsevier Inc. Monitored Anesthesia Care, Care After These instructions provide you with information about caring for yourself after your procedure. Your health care provider may also give you more specific instructions. Your treatment has been planned according to current medical practices, but problems sometimes occur. Call your health care provider  if you have any problems or questions after your procedure. What can I expect after the procedure? After your procedure, you may:  Feel sleepy for several hours.  Feel clumsy and have poor balance for several hours.  Feel forgetful about what happened after the procedure.  Have poor judgment for several hours.  Feel nauseous or vomit.  Have a sore throat if you had a breathing tube during the procedure. Follow these instructions at home: For at least 24 hours after the procedure:      Have a responsible adult stay with you. It is important to have someone help care for you until you are awake and alert.  Rest as needed.  Do not: ? Participate in activities in which you could fall or become injured. ? Drive. ? Use heavy machinery. ? Drink alcohol. ? Take sleeping pills or medicines that cause drowsiness. ? Make important decisions or sign legal documents. ? Take care of children on your own. Eating and drinking  Follow the diet that is recommended by your health care provider.  If you vomit, drink water, juice, or soup when you can drink without vomiting.  Make sure you have little or no nausea before eating solid foods. General instructions  Take over-the-counter and prescription medicines only as told by your health care provider.  If you have sleep apnea, surgery and certain medicines can increase your risk for breathing problems. Follow instructions from your health care provider about wearing your sleep device: ? Anytime you are sleeping, including during daytime naps. ? While taking prescription pain medicines, sleeping medicines, or medicines that make you drowsy.  If you smoke, do not smoke without supervision.  Keep all follow-up visits as told by your health care provider. This is important. Contact a health care provider if:  You keep feeling nauseous or you keep vomiting.  You feel light-headed.  You develop a rash.  You have a fever. Get help  right away if:  You have trouble breathing. Summary  For several hours after your procedure, you may feel sleepy and have poor judgment.  Have a responsible adult stay with you for at least 24 hours or until you are awake and alert. This information is not intended to replace advice given to you by your health care provider. Make sure you discuss any questions you have with your health care provider. Document Revised: 11/26/2017 Document Reviewed: 12/19/2015 Elsevier Patient Education  2020 [employer].

## 2020-08-16 ENCOUNTER — Telehealth (INDEPENDENT_AMBULATORY_CARE_PROVIDER_SITE_OTHER): Payer: Self-pay

## 2020-08-18 ENCOUNTER — Other Ambulatory Visit (HOSPITAL_COMMUNITY): Payer: Medicare Other | Attending: Gastroenterology

## 2020-08-18 ENCOUNTER — Encounter (HOSPITAL_COMMUNITY): Payer: Self-pay

## 2020-08-18 ENCOUNTER — Encounter (HOSPITAL_COMMUNITY)
Admission: RE | Admit: 2020-08-18 | Discharge: 2020-08-18 | Disposition: A | Discharge status: Home / Self Care | Payer: Medicare Other | Source: Ambulatory Visit | Attending: Gastroenterology | Admitting: Gastroenterology

## 2020-08-18 NOTE — Telephone Encounter (Signed)
Aimee Phillips, CMA  

## 2020-08-20 ENCOUNTER — Encounter (HOSPITAL_COMMUNITY): Admission: RE | Payer: Self-pay | Source: Home / Self Care

## 2020-08-20 ENCOUNTER — Ambulatory Visit (HOSPITAL_COMMUNITY): Admission: RE | Admit: 2020-08-20 | Payer: Medicare Other | Source: Home / Self Care | Admitting: Gastroenterology

## 2020-08-20 SURGERY — ESOPHAGOGASTRODUODENOSCOPY (EGD) WITH PROPOFOL
Anesthesia: Monitor Anesthesia Care

## 2020-08-23 DIAGNOSIS — Z79891 Long term (current) use of opiate analgesic: Secondary | ICD-10-CM | POA: Diagnosis not present

## 2020-08-23 DIAGNOSIS — G4733 Obstructive sleep apnea (adult) (pediatric): Secondary | ICD-10-CM | POA: Diagnosis not present

## 2020-08-23 DIAGNOSIS — M545 Low back pain, unspecified: Secondary | ICD-10-CM | POA: Diagnosis not present

## 2020-08-23 DIAGNOSIS — M79673 Pain in unspecified foot: Secondary | ICD-10-CM | POA: Diagnosis not present

## 2020-08-23 DIAGNOSIS — M25562 Pain in left knee: Secondary | ICD-10-CM | POA: Diagnosis not present

## 2020-08-24 ENCOUNTER — Ambulatory Visit: Payer: Medicare Other | Admitting: Family Medicine

## 2020-08-30 ENCOUNTER — Ambulatory Visit: Payer: Medicare Other | Admitting: Pharmacist

## 2020-09-04 DIAGNOSIS — J449 Chronic obstructive pulmonary disease, unspecified: Secondary | ICD-10-CM | POA: Diagnosis not present

## 2020-09-07 ENCOUNTER — Other Ambulatory Visit (INDEPENDENT_AMBULATORY_CARE_PROVIDER_SITE_OTHER): Payer: Self-pay

## 2020-09-07 ENCOUNTER — Telehealth (INDEPENDENT_AMBULATORY_CARE_PROVIDER_SITE_OTHER): Payer: Self-pay | Admitting: Internal Medicine

## 2020-09-07 NOTE — Telephone Encounter (Signed)
Patient left message stating she wants to reschedule her EGD - please advise - ph# [phone]

## 2020-09-07 NOTE — Telephone Encounter (Signed)
Noted, thank you

## 2020-09-13 ENCOUNTER — Ambulatory Visit (INDEPENDENT_AMBULATORY_CARE_PROVIDER_SITE_OTHER): Payer: Medicare Other | Admitting: Podiatry

## 2020-09-13 ENCOUNTER — Encounter: Payer: Self-pay | Admitting: Podiatry

## 2020-09-13 ENCOUNTER — Other Ambulatory Visit: Payer: Self-pay

## 2020-09-13 DIAGNOSIS — M779 Enthesopathy, unspecified: Secondary | ICD-10-CM | POA: Diagnosis not present

## 2020-09-13 DIAGNOSIS — E1140 Type 2 diabetes mellitus with diabetic neuropathy, unspecified: Secondary | ICD-10-CM | POA: Diagnosis not present

## 2020-09-13 DIAGNOSIS — E1149 Type 2 diabetes mellitus with other diabetic neurological complication: Secondary | ICD-10-CM

## 2020-09-14 ENCOUNTER — Ambulatory Visit (INDEPENDENT_AMBULATORY_CARE_PROVIDER_SITE_OTHER): Payer: Medicare Other | Admitting: Pharmacist

## 2020-09-14 DIAGNOSIS — Z794 Long term (current) use of insulin: Secondary | ICD-10-CM

## 2020-09-14 DIAGNOSIS — E1165 Type 2 diabetes mellitus with hyperglycemia: Secondary | ICD-10-CM | POA: Diagnosis not present

## 2020-09-14 MED ORDER — TRULICITY 0.75 MG/0.5ML ~~LOC~~ SOAJ: 0.7500 mg | SUBCUTANEOUS | 1 refills | Status: DC

## 2020-09-14 NOTE — Progress Notes (Signed)
    09/14/2020 Name: Aimee Phillips MRN: [id number] DOB: August 10, 1971  S:  30 yoF Presents for diabetes evaluation, education, and management Patient was referred and last seen by Primary Care Provider on 07/28/20.  Patient used to follow with Dr. Fransico Him endocrine, however she has not follow up.  She would like for PCP to manage T2DM condition.  Insurance coverage/medication affordability: [employer] dual medicaid/care  Patient reports adherence with medications. . Current diabetes medications include:lantus, humalog, januvia . Current hypertension medications include: lisinopril Goal 130/80 . Current hyperlipidemia medications include: atorvastatin   Patient denies hypoglycemic events.   Discussed meal planning options and Plate method for healthy eating . Avoid sugary drinks and desserts . Incorporate balanced protein, non starchy veggies, 1 serving of carbohydrate with each meal . Increase water intake . Increase physical activity as able    O:  Lab Results  Component Value Date   HGBA1C 11.7 (H) 07/28/2020     Lipid Panel     Component Value Date/Time   CHOL 174 03/06/2019 1314   TRIG 142 03/06/2019 1314   HDL 36 (L) 03/06/2019 1314   CHOLHDL 4.8 (H) 03/06/2019 1314   LDLCALC 110 (H) 03/06/2019 1314    Home fasting blood sugars: >200  2 hour post-meal/random blood sugars: n/a.   The 10-year ASCVD risk score Denman George DC Montez Hageman., et al., 2013) is: 6.7%   Values used to calculate the score:     Age: 50 years     Sex: Female     Is Non-Hispanic African American: No     Diabetic: Yes     Tobacco smoker: No     Systolic Blood Pressure: 162 mmHg     Is BP treated: Yes     HDL Cholesterol: 36 mg/dL     Total Cholesterol: 174 mg/dL    A/P:  Diabetes G6KZ currently UNCONTROLLED.  Patient is adherent with medication. Control is suboptimal due to DIET/LIFESTYLE.  Patient used to follow with Dr. Fransico Him for endocrine, but has not returned to see him.  -STOP JANUVIA  -START TRULICITY  0.75MG  SQ WEEKLY; plan to titrate to 1.5mg  sq weekly next month (as tolerated); denies history of thyroid cancer; GLP1 choice chosen based on insurance formulary/PA required and completed  -CONTINUE ALL INSULIN AS PRESCRIBED; consider Evaristo Bury U-200 or more concentrated insulin for better absorption  -Extensively discussed pathophysiology of diabetes, recommended lifestyle interventions, dietary effects on blood sugar control  -Counseled on s/sx of and management of hypoglycemia  -Next A1C anticipated 10/29/20.   Written patient instructions provided.  Total time in face to face counseling 25 minutes.   Follow up PCP Clinic Visit ON 10/29/20  Kieth Brightly, PharmD, BCPS Clinical Pharmacist, [facility]  II Phone [phone]

## 2020-09-14 NOTE — Progress Notes (Signed)
Subjective:   Patient ID: Aimee Phillips, female   DOB: 49 y.o.   MRN: [id number]   HPI Patient presents stating that she has had discoloration of her big toes and she is concerned about why this happened but does remember trauma prior to this occurring.  Patient is not in good health currently and has pain in her foot   Review of Systems  All other systems reviewed and are negative.       Objective:  Physical Exam Vitals and nursing note reviewed.  Constitutional:      Appearance: She is well-developed and well-nourished.  Cardiovascular:     Pulses: Intact distal pulses.  Pulmonary:     Effort: Pulmonary effort is normal.  Musculoskeletal:        General: Normal range of motion.  Skin:    General: Skin is warm.  Neurological:     Mental Status: She is alert.     Neurovascular status was found to be intact muscle strength was found to be adequate range of motion is within normal limits.  Patient does have moderate pain in the both feet of the localized nature and is noted to have discoloration hallux nail lateral side bilateral same area where there is also been history of trauma within this area     Assessment:  Probability for trauma with inflammatory capsulitis of a generalized nature bilateral     Plan:  H&P reviewed conditions and recommended that the nail should hopefully grow out over time but since they are not painful I do not recommend removal.  I educated her on foot pain shoe gear modifications topical medicines and stretching exercises as needed

## 2020-09-15 ENCOUNTER — Encounter (INDEPENDENT_AMBULATORY_CARE_PROVIDER_SITE_OTHER): Payer: Self-pay

## 2020-09-15 ENCOUNTER — Other Ambulatory Visit (INDEPENDENT_AMBULATORY_CARE_PROVIDER_SITE_OTHER): Payer: Self-pay

## 2020-09-18 DIAGNOSIS — Z794 Long term (current) use of insulin: Secondary | ICD-10-CM | POA: Diagnosis not present

## 2020-09-18 DIAGNOSIS — E1165 Type 2 diabetes mellitus with hyperglycemia: Secondary | ICD-10-CM | POA: Diagnosis not present

## 2020-09-28 NOTE — Patient Instructions (Signed)
Aimee Phillips  09/28/2020     @PREFPERIOPPHARMACY @   Your procedure is scheduled on  10/01/2020.  Report to [facility] at  1100   A.M.  Call this number if you have problems the morning of surgery:  [phone]   Remember:  Follow the diet instructions given to you by the office.                       Take these medicines the morning of surgery with A SIP OF WATER  Wellbutrin, flexeril(if needed), cymbalta, gabapentin, hydrocodone(if needed), prilosec, vesicare. Take 40 units of glargine the night before your porcedure. DO NOT take any medications for diabetes the morning of your procedure. Use your inhaler befroe you come.    Do not wear jewelry, make-up or nail polish.  Do not wear lotions, powders, or perfumes, or deodorant. Please brush your teeth.  Do not shave 48 hours prior to surgery.  Men may shave face and neck.  Do not bring valuables to the hospital.  [facility] is not responsible for any belongings or valuables.  Contacts, dentures or bridgework may not be worn into surgery.  Leave your suitcase in the car.  After surgery it may be brought to your room.  For patients admitted to the hospital, discharge time will be determined by your treatment team.  Patients discharged the day of surgery will not be allowed to drive home.   Name and phone number of your driver:   Family   Special instructions:  DO NOT smoke the morning of your procedure.  Please read over the following fact sheets that you were given. Anesthesia Post-op Instructions and Care and Recovery After Surgery       Upper Endoscopy, Adult, Care After This sheet gives you information about how to care for yourself after your procedure. Your health care provider may also give you more specific instructions. If you have problems or questions, contact your health care provider. What can I expect after the procedure? After the procedure, it is common to have:  A sore throat.  Mild stomach  pain or discomfort.  Bloating.  Nausea. Follow these instructions at home:  Follow instructions from your health care provider about what to eat or drink after your procedure.  Return to your normal activities as told by your health care provider. Ask your health care provider what activities are safe for you.  Take over-the-counter and prescription medicines only as told by your health care provider.  If you were given a sedative during the procedure, it can affect you for several hours. Do not drive or operate machinery until your health care provider says that it is safe.  Keep all follow-up visits as told by your health care provider. This is important.   Contact a health care provider if you have:  A sore throat that lasts longer than one day.  Trouble swallowing. Get help right away if:  You vomit blood or your vomit looks like coffee grounds.  You have: ? A fever. ? Bloody, black, or tarry stools. ? A severe sore throat or you cannot swallow. ? Difficulty breathing. ? Severe pain in your chest or abdomen. Summary  After the procedure, it is common to have a sore throat, mild stomach discomfort, bloating, and nausea.  If you were given a sedative during the procedure, it can affect you for several hours. Do not drive or operate machinery until your  health care provider says that it is safe.  Follow instructions from your health care provider about what to eat or drink after your procedure.  Return to your normal activities as told by your health care provider. This information is not intended to replace advice given to you by your health care provider. Make sure you discuss any questions you have with your health care provider. Document Revised: 08/26/2019 Document Reviewed: 01/28/2018 Elsevier Patient Education  2021 [employer] Monitored Anesthesia Care, Care After This sheet gives you information about how to care for yourself after your procedure. Your health  care provider may also give you more specific instructions. If you have problems or questions, contact your health care provider. What can I expect after the procedure? After the procedure, it is common to have:  Tiredness.  Forgetfulness about what happened after the procedure.  Impaired judgment for important decisions.  Nausea or vomiting.  Some difficulty with balance. Follow these instructions at home: For the time period you were told by your health care provider:  Rest as needed.  Do not participate in activities where you could fall or become injured.  Do not drive or use machinery.  Do not drink alcohol.  Do not take sleeping pills or medicines that cause drowsiness.  Do not make important decisions or sign legal documents.  Do not take care of children on your own.      Eating and drinking  Follow the diet that is recommended by your health care provider.  Drink enough fluid to keep your urine pale yellow.  If you vomit: ? Drink water, juice, or soup when you can drink without vomiting. ? Make sure you have little or no nausea before eating solid foods. General instructions  Have a responsible adult stay with you for the time you are told. It is important to have someone help care for you until you are awake and alert.  Take over-the-counter and prescription medicines only as told by your health care provider.  If you have sleep apnea, surgery and certain medicines can increase your risk for breathing problems. Follow instructions from your health care provider about wearing your sleep device: ? Anytime you are sleeping, including during daytime naps. ? While taking prescription pain medicines, sleeping medicines, or medicines that make you drowsy.  Avoid smoking.  Keep all follow-up visits as told by your health care provider. This is important. Contact a health care provider if:  You keep feeling nauseous or you keep vomiting.  You feel  light-headed.  You are still sleepy or having trouble with balance after 24 hours.  You develop a rash.  You have a fever.  You have redness or swelling around the IV site. Get help right away if:  You have trouble breathing.  You have new-onset confusion at home. Summary  For several hours after your procedure, you may feel tired. You may also be forgetful and have poor judgment.  Have a responsible adult stay with you for the time you are told. It is important to have someone help care for you until you are awake and alert.  Rest as told. Do not drive or operate machinery. Do not drink alcohol or take sleeping pills.  Get help right away if you have trouble breathing, or if you suddenly become confused. This information is not intended to replace advice given to you by your health care provider. Make sure you discuss any questions you have with your health care provider. Document Revised:  05/13/2020 Document Reviewed: 07/31/2019 Elsevier Patient Education  [employer].

## 2020-09-29 ENCOUNTER — Encounter (HOSPITAL_COMMUNITY)
Admission: RE | Admit: 2020-09-29 | Discharge: 2020-09-29 | Disposition: A | Discharge status: Home / Self Care | Payer: Medicare Other | Source: Ambulatory Visit | Attending: Gastroenterology | Admitting: Gastroenterology

## 2020-09-29 ENCOUNTER — Encounter (HOSPITAL_COMMUNITY): Payer: Self-pay

## 2020-09-29 ENCOUNTER — Other Ambulatory Visit (HOSPITAL_COMMUNITY): Admission: RE | Admit: 2020-09-29 | Payer: Medicare Other | Source: Ambulatory Visit

## 2020-09-30 ENCOUNTER — Encounter (HOSPITAL_COMMUNITY): Payer: Self-pay | Admitting: Anesthesiology

## 2020-10-01 ENCOUNTER — Ambulatory Visit (HOSPITAL_COMMUNITY): Admission: RE | Admit: 2020-10-01 | Payer: Medicare Other | Source: Home / Self Care | Admitting: Gastroenterology

## 2020-10-01 ENCOUNTER — Encounter (HOSPITAL_COMMUNITY): Admission: RE | Payer: Self-pay | Source: Home / Self Care

## 2020-10-01 SURGERY — ESOPHAGOGASTRODUODENOSCOPY (EGD) WITH PROPOFOL
Anesthesia: Monitor Anesthesia Care

## 2020-10-05 ENCOUNTER — Other Ambulatory Visit: Payer: Self-pay | Admitting: Family Medicine

## 2020-10-05 DIAGNOSIS — J449 Chronic obstructive pulmonary disease, unspecified: Secondary | ICD-10-CM | POA: Diagnosis not present

## 2020-10-14 ENCOUNTER — Telehealth: Payer: Self-pay

## 2020-10-14 NOTE — Telephone Encounter (Signed)
Pt wants a rx for oversize wheelchair send to [facility]. Please call back when done

## 2020-10-14 NOTE — Telephone Encounter (Signed)
Patient will need a face to face appt for oversize wheelchair. Called and left message please schedule a face to face 30 min.

## 2020-10-19 ENCOUNTER — Other Ambulatory Visit: Payer: Self-pay | Admitting: Family Medicine

## 2020-10-20 DIAGNOSIS — G4733 Obstructive sleep apnea (adult) (pediatric): Secondary | ICD-10-CM | POA: Diagnosis not present

## 2020-10-20 DIAGNOSIS — M545 Low back pain, unspecified: Secondary | ICD-10-CM | POA: Diagnosis not present

## 2020-10-20 DIAGNOSIS — M79673 Pain in unspecified foot: Secondary | ICD-10-CM | POA: Diagnosis not present

## 2020-10-20 DIAGNOSIS — Z79891 Long term (current) use of opiate analgesic: Secondary | ICD-10-CM | POA: Diagnosis not present

## 2020-10-20 DIAGNOSIS — M25562 Pain in left knee: Secondary | ICD-10-CM | POA: Diagnosis not present

## 2020-10-20 DIAGNOSIS — E1140 Type 2 diabetes mellitus with diabetic neuropathy, unspecified: Secondary | ICD-10-CM | POA: Diagnosis not present

## 2020-10-22 DIAGNOSIS — G4733 Obstructive sleep apnea (adult) (pediatric): Secondary | ICD-10-CM | POA: Insufficient documentation

## 2020-10-26 ENCOUNTER — Other Ambulatory Visit (INDEPENDENT_AMBULATORY_CARE_PROVIDER_SITE_OTHER): Payer: Self-pay

## 2020-10-26 DIAGNOSIS — G8929 Other chronic pain: Secondary | ICD-10-CM

## 2020-10-26 NOTE — Patient Instructions (Signed)
Aimee Phillips  10/26/2020     @PREFPERIOPPHARMACY @   Your procedure is scheduled on  10/29/2020   Report to [facility] at  1100  A.M.   Call this number if you have problems the morning of surgery:  [phone]   Remember:  Follow the diet and pre instructions given to you by the office.                       Take these medicines the morning of surgery with A SIP OF WATER     Wellbutrin, flexeril(if needed), gabapentin, hydrocodone(if needed), prilosec, vesicare.        Use your inhalers before you come and bring your rescue inhaler with you.  Take 40 units of your Lantus the night before your procedure.  DO NOT take any medications for diabetes the morning of your procedure.  If your glucose is below 70 the morning of your procedure, drink 1/2 cup of clear juice and recheck your glucose in 15 minutes. If it is still not above 70, call [phone] for instructions.  If your glucose is over 300 the morning of your procedure, call [phone] for instructions.     Please brush your teeth.  Do not wear jewelry, make-up or nail polish.  Do not wear lotions, powders, or perfumes, or deodorant.  Do not shave 48 hours prior to surgery.  Men may shave face and neck.  Do not bring valuables to the hospital.  [facility] is not responsible for any belongings or valuables.  Contacts, dentures or bridgework may not be worn into surgery.  Leave your suitcase in the car.  After surgery it may be brought to your room.  For patients admitted to the hospital, discharge time will be determined by your treatment team.  Patients discharged the day of surgery will not be allowed to drive home and must have someone with them for 24 hours.     Special instructions:   DO NOT smoke tobacco or vape the morning of your procedure.    Please read over the following fact sheets that you were given. Anesthesia Post-op Instructions and Care and Recovery After Surgery       Upper  Endoscopy, Adult, Care After This sheet gives you information about how to care for yourself after your procedure. Your health care provider may also give you more specific instructions. If you have problems or questions, contact your health care provider. What can I expect after the procedure? After the procedure, it is common to have:  A sore throat.  Mild stomach pain or discomfort.  Bloating.  Nausea. Follow these instructions at home:  Follow instructions from your health care provider about what to eat or drink after your procedure.  Return to your normal activities as told by your health care provider. Ask your health care provider what activities are safe for you.  Take over-the-counter and prescription medicines only as told by your health care provider.  If you were given a sedative during the procedure, it can affect you for several hours. Do not drive or operate machinery until your health care provider says that it is safe.  Keep all follow-up visits as told by your health care provider. This is important.   Contact a health care provider if you have:  A sore throat that lasts longer than one day.  Trouble swallowing. Get help right away if:  You vomit blood or your  vomit looks like coffee grounds.  You have: ? A fever. ? Bloody, black, or tarry stools. ? A severe sore throat or you cannot swallow. ? Difficulty breathing. ? Severe pain in your chest or abdomen. Summary  After the procedure, it is common to have a sore throat, mild stomach discomfort, bloating, and nausea.  If you were given a sedative during the procedure, it can affect you for several hours. Do not drive or operate machinery until your health care provider says that it is safe.  Follow instructions from your health care provider about what to eat or drink after your procedure.  Return to your normal activities as told by your health care provider. This information is not intended to  replace advice given to you by your health care provider. Make sure you discuss any questions you have with your health care provider. Document Revised: 08/26/2019 Document Reviewed: 01/28/2018 Elsevier Patient Education  2021 Elsevier Inc. Monitored Anesthesia Care, Care After This sheet gives you information about how to care for yourself after your procedure. Your health care provider may also give you more specific instructions. If you have problems or questions, contact your health care provider. What can I expect after the procedure? After the procedure, it is common to have:  Tiredness.  Forgetfulness about what happened after the procedure.  Impaired judgment for important decisions.  Nausea or vomiting.  Some difficulty with balance. Follow these instructions at home: For the time period you were told by your health care provider:  Rest as needed.  Do not participate in activities where you could fall or become injured.  Do not drive or use machinery.  Do not drink alcohol.  Do not take sleeping pills or medicines that cause drowsiness.  Do not make important decisions or sign legal documents.  Do not take care of children on your own.      Eating and drinking  Follow the diet that is recommended by your health care provider.  Drink enough fluid to keep your urine pale yellow.  If you vomit: ? Drink water, juice, or soup when you can drink without vomiting. ? Make sure you have little or no nausea before eating solid foods. General instructions  Have a responsible adult stay with you for the time you are told. It is important to have someone help care for you until you are awake and alert.  Take over-the-counter and prescription medicines only as told by your health care provider.  If you have sleep apnea, surgery and certain medicines can increase your risk for breathing problems. Follow instructions from your health care provider about wearing your sleep  device: ? Anytime you are sleeping, including during daytime naps. ? While taking prescription pain medicines, sleeping medicines, or medicines that make you drowsy.  Avoid smoking.  Keep all follow-up visits as told by your health care provider. This is important. Contact a health care provider if:  You keep feeling nauseous or you keep vomiting.  You feel light-headed.  You are still sleepy or having trouble with balance after 24 hours.  You develop a rash.  You have a fever.  You have redness or swelling around the IV site. Get help right away if:  You have trouble breathing.  You have new-onset confusion at home. Summary  For several hours after your procedure, you may feel tired. You may also be forgetful and have poor judgment.  Have a responsible adult stay with you for the time you are told.  It is important to have someone help care for you until you are awake and alert.  Rest as told. Do not drive or operate machinery. Do not drink alcohol or take sleeping pills.  Get help right away if you have trouble breathing, or if you suddenly become confused. This information is not intended to replace advice given to you by your health care provider. Make sure you discuss any questions you have with your health care provider. Document Revised: 05/13/2020 Document Reviewed: 07/31/2019 Elsevier Patient Education  2021 [employer].

## 2020-10-27 ENCOUNTER — Other Ambulatory Visit (INDEPENDENT_AMBULATORY_CARE_PROVIDER_SITE_OTHER): Payer: Self-pay | Admitting: Internal Medicine

## 2020-10-27 ENCOUNTER — Encounter (HOSPITAL_COMMUNITY)
Admission: RE | Admit: 2020-10-27 | Discharge: 2020-10-27 | Disposition: A | Discharge status: Home / Self Care | Payer: Medicare Other | Source: Ambulatory Visit | Attending: Gastroenterology | Admitting: Gastroenterology

## 2020-10-27 ENCOUNTER — Other Ambulatory Visit: Payer: Self-pay

## 2020-10-27 ENCOUNTER — Other Ambulatory Visit (HOSPITAL_COMMUNITY)
Admission: RE | Admit: 2020-10-27 | Discharge: 2020-10-27 | Disposition: A | Discharge status: Home / Self Care | Payer: Medicare Other | Source: Ambulatory Visit | Attending: Gastroenterology | Admitting: Gastroenterology

## 2020-10-27 ENCOUNTER — Encounter (HOSPITAL_COMMUNITY): Payer: Self-pay

## 2020-10-27 DIAGNOSIS — Z20822 Contact with and (suspected) exposure to covid-19: Secondary | ICD-10-CM | POA: Insufficient documentation

## 2020-10-27 DIAGNOSIS — Z01812 Encounter for preprocedural laboratory examination: Secondary | ICD-10-CM | POA: Diagnosis not present

## 2020-10-27 DIAGNOSIS — F32A Depression, unspecified: Secondary | ICD-10-CM | POA: Diagnosis not present

## 2020-10-27 DIAGNOSIS — R000 Tachycardia, unspecified: Secondary | ICD-10-CM | POA: Diagnosis not present

## 2020-10-27 LAB — BASIC METABOLIC PANEL
Anion gap: 7 (ref 5–15)
BUN: 10 mg/dL (ref 6–20)
CO2: 24 mmol/L (ref 22–32)
Calcium: 8.9 mg/dL (ref 8.9–10.3)
Chloride: 99 mmol/L (ref 98–111)
Creatinine, Ser: 0.6 mg/dL (ref 0.44–1.00)
GFR, Estimated: >60 mL/min (ref >60)
Glucose, Bld: 198 mg/dL — ABNORMAL HIGH (ref 70–99)
Potassium: 3.9 mmol/L (ref 3.5–5.1)
Sodium: 130 mmol/L — ABNORMAL LOW (ref 135–145)

## 2020-10-27 LAB — HCG, SERUM, QUALITATIVE: Preg, Serum: NEGATIVE

## 2020-10-27 LAB — SARS CORONAVIRUS 2 (TAT 6-24 HRS): SARS Coronavirus 2: NEGATIVE

## 2020-10-27 LAB — HEMOGLOBIN A1C
Hgb A1c MFr Bld: 9.5 % — ABNORMAL HIGH (ref 4.8–5.6)
Mean Plasma Glucose: 225.95 mg/dL

## 2020-10-27 MED ORDER — SUCRALFATE 1 G PO TABS: 1.0000 g | ORAL_TABLET | Freq: Three times a day (TID) | ORAL | 1 refills | Status: DC

## 2020-10-27 NOTE — Progress Notes (Signed)
Prescription for sucralfate filled. Patient scheduled for procedure with Dr. Levon Hedger on 10/29/2020

## 2020-10-29 ENCOUNTER — Ambulatory Visit (HOSPITAL_COMMUNITY): Payer: Medicare Other | Admitting: Anesthesiology

## 2020-10-29 ENCOUNTER — Other Ambulatory Visit: Payer: Self-pay

## 2020-10-29 ENCOUNTER — Ambulatory Visit (HOSPITAL_COMMUNITY)
Admission: RE | Admit: 2020-10-29 | Discharge: 2020-10-29 | Disposition: A | Discharge status: Home / Self Care | Payer: Medicare Other | Attending: Gastroenterology | Admitting: Gastroenterology

## 2020-10-29 ENCOUNTER — Ambulatory Visit: Payer: Medicare Other | Admitting: Family Medicine

## 2020-10-29 ENCOUNTER — Encounter (HOSPITAL_COMMUNITY)
Admission: RE | Disposition: A | Discharge status: Home / Self Care | Payer: Self-pay | Source: Home / Self Care | Attending: Gastroenterology

## 2020-10-29 ENCOUNTER — Encounter (HOSPITAL_COMMUNITY): Payer: Self-pay | Admitting: Gastroenterology

## 2020-10-29 DIAGNOSIS — Z7951 Long term (current) use of inhaled steroids: Secondary | ICD-10-CM | POA: Diagnosis not present

## 2020-10-29 DIAGNOSIS — R1013 Epigastric pain: Secondary | ICD-10-CM | POA: Diagnosis not present

## 2020-10-29 DIAGNOSIS — X58XXXA Exposure to other specified factors, initial encounter: Secondary | ICD-10-CM | POA: Diagnosis not present

## 2020-10-29 DIAGNOSIS — Z79899 Other long term (current) drug therapy: Secondary | ICD-10-CM | POA: Diagnosis not present

## 2020-10-29 DIAGNOSIS — Z794 Long term (current) use of insulin: Secondary | ICD-10-CM | POA: Insufficient documentation

## 2020-10-29 DIAGNOSIS — E119 Type 2 diabetes mellitus without complications: Secondary | ICD-10-CM | POA: Diagnosis not present

## 2020-10-29 DIAGNOSIS — T182XXA Foreign body in stomach, initial encounter: Secondary | ICD-10-CM | POA: Diagnosis not present

## 2020-10-29 DIAGNOSIS — K5900 Constipation, unspecified: Secondary | ICD-10-CM | POA: Diagnosis not present

## 2020-10-29 DIAGNOSIS — J449 Chronic obstructive pulmonary disease, unspecified: Secondary | ICD-10-CM | POA: Insufficient documentation

## 2020-10-29 DIAGNOSIS — Z86711 Personal history of pulmonary embolism: Secondary | ICD-10-CM | POA: Insufficient documentation

## 2020-10-29 DIAGNOSIS — Z9049 Acquired absence of other specified parts of digestive tract: Secondary | ICD-10-CM | POA: Diagnosis not present

## 2020-10-29 DIAGNOSIS — K3189 Other diseases of stomach and duodenum: Secondary | ICD-10-CM | POA: Diagnosis not present

## 2020-10-29 DIAGNOSIS — G8929 Other chronic pain: Secondary | ICD-10-CM

## 2020-10-29 HISTORY — PX: ESOPHAGOGASTRODUODENOSCOPY (EGD) WITH PROPOFOL: SHX5813

## 2020-10-29 LAB — GLUCOSE, CAPILLARY: Glucose-Capillary: 194 mg/dL — ABNORMAL HIGH (ref 70–99)

## 2020-10-29 SURGERY — ESOPHAGOGASTRODUODENOSCOPY (EGD) WITH PROPOFOL
Anesthesia: General

## 2020-10-29 MED ORDER — LIDOCAINE VISCOUS HCL 2 % MT SOLN
15.0000 mL | Freq: Once | OROMUCOSAL | Status: AC
  Administered 2020-10-29: 15 mL via OROMUCOSAL

## 2020-10-29 MED ORDER — CHLORHEXIDINE GLUCONATE CLOTH 2 % EX PADS: 6.0000 | MEDICATED_PAD | Freq: Once | CUTANEOUS | Status: DC

## 2020-10-29 MED ORDER — GLYCOPYRROLATE 0.2 MG/ML IJ SOLN: 0.2000 mg | Freq: Once | INTRAMUSCULAR | Status: DC

## 2020-10-29 MED ORDER — LACTATED RINGERS IV SOLN
INTRAVENOUS | Status: DC
  Administered 2020-10-29: 1000 mL via INTRAVENOUS

## 2020-10-29 MED ORDER — LIDOCAINE HCL (PF) 2 % IJ SOLN
INTRAMUSCULAR | Status: AC
  Filled 2020-10-29: qty 5

## 2020-10-29 MED ORDER — MIDAZOLAM HCL 2 MG/2ML IJ SOLN
INTRAMUSCULAR | Status: AC
  Filled 2020-10-29: qty 2

## 2020-10-29 MED ORDER — LIDOCAINE VISCOUS HCL 2 % MT SOLN
OROMUCOSAL | Status: AC
  Filled 2020-10-29: qty 15

## 2020-10-29 MED ORDER — PROPOFOL 10 MG/ML IV BOLUS
INTRAVENOUS | Status: DC | PRN
  Administered 2020-10-29: 90 mg via INTRAVENOUS

## 2020-10-29 MED ORDER — LIDOCAINE HCL (CARDIAC) PF 100 MG/5ML IV SOSY
PREFILLED_SYRINGE | INTRAVENOUS | Status: DC | PRN
  Administered 2020-10-29: 100 mg via INTRAVENOUS

## 2020-10-29 MED ORDER — PROPOFOL 500 MG/50ML IV EMUL
INTRAVENOUS | Status: DC | PRN
  Administered 2020-10-29: 150 ug/kg/min via INTRAVENOUS

## 2020-10-29 MED ORDER — MIDAZOLAM HCL 2 MG/2ML IJ SOLN
INTRAMUSCULAR | Status: DC | PRN
  Administered 2020-10-29: 2 mg via INTRAVENOUS

## 2020-10-29 NOTE — Anesthesia Preprocedure Evaluation (Signed)
Anesthesia Evaluation  Patient identified by MRN, date of birth, ID band Patient awake    Reviewed: Allergy & Precautions, NPO status , Patient's Chart, lab work & pertinent test results  Airway Mallampati: III  TM Distance: >3 FB Neck ROM: Full    Dental  (+) Dental Advisory Given, Chipped, Poor Dentition   Pulmonary asthma , COPD,  oxygen dependent,    Pulmonary exam normal breath sounds clear to auscultation       Cardiovascular Exercise Tolerance: Poor hypertension, Pt. on medications Normal cardiovascular exam Rhythm:Regular Rate:Normal     Neuro/Psych PSYCHIATRIC DISORDERS Depression Bipolar Disorder  Neuromuscular disease    GI/Hepatic negative GI ROS, Neg liver ROS,   Endo/Other  diabetes, Well Controlled, Type 2, Insulin DependentMorbid obesity  Renal/GU negative Renal ROS     Musculoskeletal negative musculoskeletal ROS (+)   Abdominal   Peds  Hematology negative hematology ROS (+)   Anesthesia Other Findings   Reproductive/Obstetrics negative OB ROS                             Anesthesia Physical Anesthesia Plan  ASA: IV  Anesthesia Plan: General   Post-op Pain Management:    Induction: Intravenous  PONV Risk Score and Plan: TIVA  Airway Management Planned: Nasal Cannula, Natural Airway and Simple Face Mask  Additional Equipment:   Intra-op Plan:   Post-operative Plan:   Informed Consent: I have reviewed the patients History and Physical, chart, labs and discussed the procedure including the risks, benefits and alternatives for the proposed anesthesia with the patient or authorized representative who has indicated his/her understanding and acceptance.     Dental advisory given  Plan Discussed with: CRNA and Surgeon  Anesthesia Plan Comments:         Anesthesia Quick Evaluation

## 2020-10-29 NOTE — Anesthesia Postprocedure Evaluation (Signed)
Anesthesia Post Note  Patient: Aimee Phillips  Procedure(s) Performed: ESOPHAGOGASTRODUODENOSCOPY (EGD) WITH PROPOFOL (N/A )  Patient location during evaluation: PACU Anesthesia Type: General Level of consciousness: awake and alert Pain management: satisfactory to patient Vital Signs Assessment: post-procedure vital signs reviewed and stable Respiratory status: spontaneous breathing, respiratory function stable and patient connected to nasal cannula oxygen (Home O2 3L all times) Cardiovascular status: blood pressure returned to baseline Postop Assessment: no apparent nausea or vomiting Anesthetic complications: no   No complications documented.   Last Vitals:  Vitals:   10/29/20 1107 10/29/20 1115  BP: 101/66 (!) 108/55  Pulse: 99 (!) 111  Resp: 19 (!) 38  Temp: 37.1 C   SpO2: 97% 99%    Last Pain:  Vitals:   10/29/20 1111  TempSrc:   PainSc: 0-No pain                 Lorin Glass

## 2020-10-29 NOTE — Op Note (Signed)
[facility] Patient Name: Aimee Phillips Procedure Date: 10/29/2020 10:35 AM MRN: [id number] Date of Birth: 1971-08-16 Attending MD: Katrinka Blazing ,  CSN: [id number] Age: 50 Admit Type: Outpatient Procedure:                Upper GI endoscopy Indications:              Epigastric abdominal pain Providers:                Katrinka Blazing, Crystal Page, Dyann Ruddle Referring MD:              Medicines:                Monitored Anesthesia Care Complications:            No immediate complications. Estimated Blood Loss:     Estimated blood loss: none. Procedure:                Pre-Anesthesia Assessment:                           - Prior to the procedure, a History and Physical                            was performed, and patient medications, allergies                            and sensitivities were reviewed. The patient's                            tolerance of previous anesthesia was reviewed.                           - The risks and benefits of the procedure and the                            sedation options and risks were discussed with the                            patient. All questions were answered and informed                            consent was obtained.                           - ASA Grade Assessment: III - A patient with severe                            systemic disease.                           After obtaining informed consent, the endoscope was                            passed under direct vision. Throughout the                            procedure, the patient's blood pressure, pulse,  and                            oxygen saturations were monitored continuously. The                            Endoscope was introduced through the mouth, and                            advanced to the second part of duodenum. The upper                            GI endoscopy was accomplished without difficulty.                            The patient tolerated the procedure  well. Scope In: 11:00:01 AM Scope Out: 11:04:29 AM Total Procedure Duration: 0 hours 4 minutes 28 seconds  Findings:      The examined esophagus was normal.      A large amount of food (residue) and fluid was found in the gastric body       and fundus. This was thoroughly suctioned but complete visualization of       the mcusoal lining of this are could not be achieved.      Food (residue) was found in the first portion of the duodenum. Normal       visualization of the mucosal lining of the examined duodenum. Impression:               - Normal esophagus.                           - A large amount of food (residue) in the stomach.                           - Retained food in the duodenum.                           - No specimens collected. Moderate Sedation:      Per Anesthesia Care Recommendation:           - Discharge patient to home (ambulatory).                           - Resume previous diet.                           - Strict glucose control.                           - Proceed with scheduled CT abdomen.                           - Follow up in gastroenterology clinic in 6 weeks. Procedure Code(s):        --- Professional ---                           [phone], Esophagogastroduodenoscopy, flexible,  transoral; diagnostic, including collection of                            specimen(s) by brushing or washing, when performed                            (separate procedure) Diagnosis Code(s):        --- Professional ---                           R10.13, Epigastric pain CPT copyright 2019 American Medical Association. All rights reserved. The codes documented in this report are preliminary and upon coder review may  be revised to meet current compliance requirements. Katrinka Blazing, MD Katrinka Blazing,  10/29/2020 11:11:37 AM This report has been signed electronically. Number of Addenda: 0

## 2020-10-29 NOTE — H&P (Signed)
Aimee Phillips is an 50 y.o. female.   Chief Complaint: Abdominal pain HPI: 50 year old female with past medical history of bipolar disorder, asthma,  COPD, depression, diabetes and prior PE, who came to the hospital for evaluation of abdominal pain.  The patient has presented significant episodes of persistent abdominal pain epigastric area despite taking omeprazole 40 mg twice a day with presence of some constipation.  Had a CT of the abdomen pelvis with IV contrast on 09/19/2017 which showed multiple retroperitoneal and mesenteric lymph nodes.  He was ordered to have repeat CT of the abdomen for surveillance but she did not schedule this test.  Patient has presented some relief of the pain with intake of hyoscyamine. No prior EGDs  Past Medical History:  Diagnosis Date  . Asthma   . Bipolar affect, depressed ([place])   . Cellulitis   . COPD (chronic obstructive pulmonary disease) ([place])   . Depression   . Diabetes mellitus without complication ([place])   . PE (pulmonary embolism)   . Pulmonary edema     Past Surgical History:  Procedure Laterality Date  . CESAREAN SECTION    . CHOLECYSTECTOMY      Family History  Problem Relation Age of Onset  . Cancer Mother        breast  . Heart failure Father        died of AMI  . Cancer Sister   . Diabetes Brother   . Heart disease Sister    Social History:  reports that she has never smoked. She has never used smokeless tobacco. She reports that she does not drink alcohol and does not use drugs.  Allergies: No Known Allergies  Medications Prior to Admission  Medication Sig Dispense Refill  . albuterol (PROVENTIL HFA;VENTOLIN HFA) 108 (90 Base) MCG/ACT inhaler Inhale 2 puffs into the lungs every 6 (six) hours as needed for wheezing or shortness of breath. 1 Inhaler 0  . atorvastatin (LIPITOR) 20 MG tablet TAKE ONE TABLET BY MOUTH DAILY AT 6 PM. (Patient taking differently: Take 20 mg by mouth daily. TAKE ONE TABLET BY MOUTH DAILY AT 6 PM.)  90 tablet 0  . budesonide-formoterol (SYMBICORT) 160-4.5 MCG/ACT inhaler Inhale 2 puffs into the lungs 2 (two) times daily. 10.2 g 11  . buPROPion (WELLBUTRIN SR) 150 MG 12 hr tablet Take 1 tablet (150 mg total) by mouth daily. 90 tablet 0  . cyclobenzaprine (FLEXERIL) 10 MG tablet Take 1 tablet (10 mg total) by mouth 3 (three) times daily as needed for muscle spasms. 90 tablet 5  . docusate sodium (COLACE) 100 MG capsule Take 100 mg by mouth daily.    . DULoxetine (CYMBALTA) 60 MG capsule Take 60 mg by mouth daily.     . furosemide (LASIX) 40 MG tablet Take 1 tablet (40 mg total) by mouth 2 (two) times daily. 60 tablet 11  . gabapentin (NEURONTIN) 800 MG tablet Take 800 mg by mouth 3 (three) times daily.    Marland Kitchen HYDROcodone-acetaminophen (NORCO) 7.5-325 MG tablet Take 1 tablet by mouth in the morning, at noon, and at bedtime.     . hyoscyamine (LEVSIN SL) 0.125 MG SL tablet PLACE ONE TABLET UNDER THE TONGUE EVERY 6 HOURS AS NEEDED FOR ABDOMINAL PAIN (Patient taking differently: Take 0.125 mg by mouth every 6 (six) hours as needed for cramping.) 120 tablet 2  . ibuprofen (ADVIL) 800 MG tablet TAKE ONE TABLET BY MOUTH EVERY 8 HOURS AS NEEDED FOR MODERATE PAIN (Patient taking differently: Take 800  mg by mouth every 8 (eight) hours as needed for moderate pain. TAKE ONE TABLET BY MOUTH EVERY 8 HOURS AS NEEDED FOR MODERATE PAIN) 90 tablet 2  . Insulin Glargine (LANTUS SOLOSTAR) 100 UNIT/ML Solostar Pen Inject 80 Units into the skin at bedtime. 10 pen 2  . Insulin Lispro (HUMALOG KWIKPEN) 200 UNIT/ML SOPN Inject 10-16 Units into the skin 3 (three) times daily before meals. 10 pen 2  . lisinopril (ZESTRIL) 5 MG tablet Take 1 tablet (5 mg total) by mouth daily. 90 tablet 3  . OLANZapine (ZYPREXA) 10 MG tablet Take 10 mg by mouth at bedtime.    Marland Kitchen omeprazole (PRILOSEC) 20 MG capsule TAKE ONE CAPSULE BY MOUTH ONCE DAILY 90 capsule 0  . potassium chloride (KLOR-CON) 10 MEQ tablet TAKE ONE TABLET BY MOUTH TWICE  DAILY. (Patient taking differently: Take 10 mEq by mouth 2 (two) times daily.) 60 tablet 2  . solifenacin (VESICARE) 10 MG tablet Take 1 tablet (10 mg total) by mouth daily. 30 tablet 11  . SPIRIVA HANDIHALER 18 MCG inhalation capsule INHALE ONE PUFF BY MOUTH DAILY. (Patient taking differently: Place 18 mcg into inhaler and inhale daily as needed (asthma).) 30 capsule 12  . sucralfate (CARAFATE) 1 g tablet Take 1 tablet (1 g total) by mouth 4 (four) times daily -  with meals and at bedtime. 120 tablet 1  . TRULICITY 3.71 IR/6.7EL SOPN INJECT 0.75 MG INTO THE SKIN ONCE A WEEK,. 2 mL 0  . blood glucose meter kit and supplies Check BS up to four times a day 1 each 0  . Blood Glucose Monitoring Suppl DEVI Check bs up to four times a day 1 each 0  . glucose blood (ACCU-CHEK AVIVA) test strip Use to check blood sugar four times daily 200 each 12  . Lancet Devices MISC Test QID, DX E11.65, Aviva Plus 100 each prn  . Misc. Devices (TRANSPORT CHAIR) MISC Needs lift chair for >300lbs 1 each 0  . TRUEPLUS PEN NEEDLES 31G X 5 MM MISC USE WITH INSULIN FOUR TIMES DAILY. 100 each 5    Results for orders placed or performed during the hospital encounter of 10/29/20 (from the past 48 hour(s))  Glucose, capillary     Status: Abnormal   Collection Time: 10/29/20 10:36 AM  Result Value Ref Range   Glucose-Capillary 194 (H) 70 - 99 mg/dL    Comment: Glucose reference range applies only to samples taken after fasting for at least 8 hours.   No results found.  Review of Systems  Constitutional: Negative.   HENT: Negative.   Eyes: Negative.   Respiratory: Negative.   Cardiovascular: Negative.   Gastrointestinal: Positive for abdominal pain.  Endocrine: Negative.   Genitourinary: Negative.   Musculoskeletal: Negative.   Skin: Negative.   Allergic/Immunologic: Negative.   Neurological: Negative.   Hematological: Negative.   Psychiatric/Behavioral: Negative.     Blood pressure 128/60, pulse (!) 105,  temperature 99 F (37.2 C), temperature source Oral, resp. rate 14, height $RemoveBe'5\' 8"'HXYWokuyd$  (1.727 m), weight (!) 150.6 kg, SpO2 95 %. Physical Exam  GENERAL: The patient is AO x3, in no acute distress. Obese. HEENT: Head is normocephalic and atraumatic. EOMI are intact. Mouth is well hydrated and without lesions. NECK: Supple. No masses LUNGS: Clear to auscultation. No presence of rhonchi/wheezing/rales. Adequate chest expansion HEART: RRR, normal s1 and s2. ABDOMEN: Soft, nontender, no guarding, no peritoneal signs, and nondistended. BS +. No masses. EXTREMITIES: Without any cyanosis, clubbing, rash, lesions or edema.  NEUROLOGIC: AOx3, no focal motor deficit. SKIN: no jaundice, no rashes  Assessment/Plan 50 year old female with past medical history of bipolar disorder, asthma,  COPD, depression, diabetes and prior PE, who came to the hospital for evaluation of abdominal pain. Will proceed with EGD.   Harvel Quale, MD 10/29/2020, 10:55 AM

## 2020-10-29 NOTE — Progress Notes (Signed)
Patient ride unable to come until 1400 will try to get another family member to pick up patient, patient moved to post op room #6 to recliner given snack and drink and tv remote while we wait on ride

## 2020-10-29 NOTE — Discharge Instructions (Signed)
You are being discharged to home.  Resume your previous diet.  Strict glucose control. Proceed with scheduled CT abdomen. May consider gastric emptying study based on results. Follow up in gastroenterology clinic in 6 weeks.  PATIENT INSTRUCTIONS POST-ANESTHESIA  IMMEDIATELY FOLLOWING SURGERY:  Do not drive or operate machinery for the first twenty four hours after surgery.  Do not make any important decisions for twenty four hours after surgery or while taking narcotic pain medications or sedatives.  If you develop intractable nausea and vomiting or a severe headache please notify your doctor immediately.  FOLLOW-UP:  Please make an appointment with your surgeon as instructed. You do not need to follow up with anesthesia unless specifically instructed to do so.  WOUND CARE INSTRUCTIONS (if applicable):  Keep a dry clean dressing on the anesthesia/puncture wound site if there is drainage.  Once the wound has quit draining you may leave it open to air.  Generally you should leave the bandage intact for twenty four hours unless there is drainage.  If the epidural site drains for more than 36-48 hours please call the anesthesia department.  QUESTIONS?:  Please feel free to call your physician or the hospital operator if you have any questions, and they will be happy to assist you.      Upper Endoscopy, Adult, Care After This sheet gives you information about how to care for yourself after your procedure. Your health care provider may also give you more specific instructions. If you have problems or questions, contact your health care provider. What can I expect after the procedure? After the procedure, it is common to have:  A sore throat.  Mild stomach pain or discomfort.  Bloating.  Nausea. Follow these instructions at home:  Follow instructions from your health care provider about what to eat or drink after your procedure.  Return to your normal activities as told by your health  care provider. Ask your health care provider what activities are safe for you.  Take over-the-counter and prescription medicines only as told by your health care provider.  If you were given a sedative during the procedure, it can affect you for several hours. Do not drive or operate machinery until your health care provider says that it is safe.  Keep all follow-up visits as told by your health care provider. This is important.   Contact a health care provider if you have:  A sore throat that lasts longer than one day.  Trouble swallowing. Get help right away if:  You vomit blood or your vomit looks like coffee grounds.  You have: ? A fever. ? Bloody, black, or tarry stools. ? A severe sore throat or you cannot swallow. ? Difficulty breathing. ? Severe pain in your chest or abdomen. Summary  After the procedure, it is common to have a sore throat, mild stomach discomfort, bloating, and nausea.  If you were given a sedative during the procedure, it can affect you for several hours. Do not drive or operate machinery until your health care provider says that it is safe.  Follow instructions from your health care provider about what to eat or drink after your procedure.  Return to your normal activities as told by your health care provider. This information is not intended to replace advice given to you by your health care provider. Make sure you discuss any questions you have with your health care provider. Document Revised: 08/26/2019 Document Reviewed: 01/28/2018 Elsevier Patient Education  2021 [employer].   Message  left to schedule appointment

## 2020-10-29 NOTE — Transfer of Care (Signed)
Immediate Anesthesia Transfer of Care Note  Patient: Aimee Phillips  Procedure(s) Performed: ESOPHAGOGASTRODUODENOSCOPY (EGD) WITH PROPOFOL (N/A )  Patient Location: PACU  Anesthesia Type:General  Level of Consciousness: drowsy  Airway & Oxygen Therapy: Patient Spontanous Breathing and Patient connected to face mask oxygen  Post-op Assessment: Report given to RN and Post -op Vital signs reviewed and stable  Post vital signs: Reviewed and stable  Last Vitals:  Vitals Value Taken Time  BP    Temp    Pulse    Resp 24 10/29/20 1108  SpO2    Vitals shown include unvalidated device data.  Last Pain:  Vitals:   10/29/20 1058  TempSrc:   PainSc: 0-No pain      Patients Stated Pain Goal: 5 (10/29/20 1030)  Complications: No complications documented.

## 2020-11-01 ENCOUNTER — Other Ambulatory Visit: Payer: Self-pay | Admitting: Family Medicine

## 2020-11-01 MED ORDER — TRULICITY 0.75 MG/0.5ML ~~LOC~~ SOAJ: 0.7500 mg | SUBCUTANEOUS | 0 refills | Status: DC

## 2020-11-04 ENCOUNTER — Encounter (HOSPITAL_COMMUNITY): Payer: Self-pay | Admitting: Gastroenterology

## 2020-11-05 DIAGNOSIS — J449 Chronic obstructive pulmonary disease, unspecified: Secondary | ICD-10-CM | POA: Diagnosis not present

## 2020-11-18 ENCOUNTER — Other Ambulatory Visit: Payer: Self-pay | Admitting: Family Medicine

## 2020-11-19 ENCOUNTER — Other Ambulatory Visit: Payer: Self-pay | Admitting: Family Medicine

## 2020-11-19 DIAGNOSIS — G8929 Other chronic pain: Secondary | ICD-10-CM

## 2020-11-19 DIAGNOSIS — M79673 Pain in unspecified foot: Secondary | ICD-10-CM

## 2020-11-22 ENCOUNTER — Ambulatory Visit (HOSPITAL_COMMUNITY): Admit: 2020-11-22 | Payer: Medicare Other

## 2020-11-29 ENCOUNTER — Ambulatory Visit (INDEPENDENT_AMBULATORY_CARE_PROVIDER_SITE_OTHER): Payer: Medicare Other | Admitting: Family Medicine

## 2020-11-29 ENCOUNTER — Encounter: Payer: Self-pay | Admitting: Family Medicine

## 2020-11-29 ENCOUNTER — Other Ambulatory Visit: Payer: Self-pay

## 2020-11-29 VITALS — BP 117/78 | HR 102 | Temp 98.1°F | Resp 20 | Ht 68.0 in | Wt 343.0 lb

## 2020-11-29 DIAGNOSIS — M5442 Lumbago with sciatica, left side: Secondary | ICD-10-CM | POA: Diagnosis not present

## 2020-11-29 DIAGNOSIS — Z993 Dependence on wheelchair: Secondary | ICD-10-CM

## 2020-11-29 DIAGNOSIS — Z7409 Other reduced mobility: Secondary | ICD-10-CM | POA: Diagnosis not present

## 2020-11-29 DIAGNOSIS — Z789 Other specified health status: Secondary | ICD-10-CM

## 2020-11-29 DIAGNOSIS — Z794 Long term (current) use of insulin: Secondary | ICD-10-CM | POA: Diagnosis not present

## 2020-11-29 DIAGNOSIS — G8929 Other chronic pain: Secondary | ICD-10-CM

## 2020-11-29 DIAGNOSIS — E1165 Type 2 diabetes mellitus with hyperglycemia: Secondary | ICD-10-CM

## 2020-11-29 MED ORDER — LANTUS SOLOSTAR 100 UNIT/ML ~~LOC~~ SOPN: 40.0000 [IU] | PEN_INJECTOR | Freq: Two times a day (BID) | SUBCUTANEOUS | 3 refills | Status: DC

## 2020-11-29 MED ORDER — TRULICITY 1.5 MG/0.5ML ~~LOC~~ SOAJ: 1.5000 mg | SUBCUTANEOUS | 3 refills | Status: DC

## 2020-11-29 MED ORDER — NOVOFINE PEN NEEDLE 32G X 6 MM MISC: 99 refills | Status: DC

## 2020-11-29 MED ORDER — HUMALOG KWIKPEN 200 UNIT/ML ~~LOC~~ SOPN: 10.0000 [IU] | PEN_INJECTOR | Freq: Three times a day (TID) | SUBCUTANEOUS | 99 refills | Status: DC

## 2020-11-29 MED ORDER — FUROSEMIDE 40 MG PO TABS: 40.0000 mg | ORAL_TABLET | Freq: Two times a day (BID) | ORAL | 11 refills | Status: DC

## 2020-11-29 NOTE — Progress Notes (Signed)
Subjective: CC: F2F for wheelchair PCP: [name], DO [id number] D Chabot is a 50 y.o. female presenting to clinic today for:  1.  Impaired mobility/morbid obesity/chronic back pain Patient is managed by Dr. Freddie Apley office for her chronic pain syndrome.  She is treated with oxycodone and gabapentin.  She has follow-up visit soon with them.  She unfortunately has been wheelchair dependent for many years.  She has lung disease and requires oxygen at home.  She is unable to ambulate independently secondary to these issues.  She is in need of a new wheelchair.  2.  Type 2 diabetes Patient with uncontrolled type 2 diabetes.  Last A1c was 9.5 in February.  She is compliant with her insulins and Trulicity.  Trulicity was a new start.  She denies any increased abdominal pain, nausea or GI issues with the Trulicity.  In fact she feels that symptoms have gotten slightly better though are still present.  She had an EGD performed recently and notes that there was "a lot of food still in her stomach".  She has lost some weight with the Trulicity and is happy about this.  She wants to advance the medication because A1c is still uncontrolled.  Ms. Basden admits that she drinks soda at least twice per day.  She also eats bread and pasta.  She does not eat any sweets.  She is not physically active due to mobility issues as above  Lab Results  Component Value Date   HGBA1C 9.5 (H) 10/27/2020      ROS: Per HPI  No Known Allergies Past Medical History:  Diagnosis Date  . Asthma   . Bipolar affect, depressed ([place])   . Cellulitis   . COPD (chronic obstructive pulmonary disease) ([place])   . Depression   . Diabetes mellitus without complication ([place])   . PE (pulmonary embolism)   . Pulmonary edema     Current Outpatient Medications:  .  albuterol (PROVENTIL HFA;VENTOLIN HFA) 108 (90 Base) MCG/ACT inhaler, Inhale 2 puffs into the lungs every 6 (six) hours as needed for wheezing or shortness  of breath., Disp: 1 Inhaler, Rfl: 0 .  atorvastatin (LIPITOR) 20 MG tablet, TAKE ONE TABLET BY MOUTH DAILY AT 6 PM. (Patient taking differently: Take 20 mg by mouth daily. TAKE ONE TABLET BY MOUTH DAILY AT 6 PM.), Disp: 90 tablet, Rfl: 0 .  blood glucose meter kit and supplies, Check BS up to four times a day, Disp: 1 each, Rfl: 0 .  Blood Glucose Monitoring Suppl DEVI, Check bs up to four times a day, Disp: 1 each, Rfl: 0 .  budesonide-formoterol (SYMBICORT) 160-4.5 MCG/ACT inhaler, Inhale 2 puffs into the lungs 2 (two) times daily., Disp: 10.2 g, Rfl: 11 .  buPROPion (WELLBUTRIN SR) 150 MG 12 hr tablet, TAKE ONE TABLET BY MOUTH DAILY, Disp: 90 tablet, Rfl: 0 .  cyclobenzaprine (FLEXERIL) 10 MG tablet, Take 1 tablet (10 mg total) by mouth 3 (three) times daily as needed for muscle spasms., Disp: 90 tablet, Rfl: 5 .  docusate sodium (COLACE) 100 MG capsule, Take 100 mg by mouth daily., Disp: , Rfl:  .  Dulaglutide (TRULICITY) 9.93 ZJ/6.9CV SOPN, Inject 0.75 mg into the skin once a week., Disp: 2 mL, Rfl: 0 .  DULoxetine (CYMBALTA) 60 MG capsule, Take 60 mg by mouth daily. , Disp: , Rfl:  .  furosemide (LASIX) 40 MG tablet, Take 1 tablet (40 mg total) by mouth 2 (two) times daily., Disp: 60 tablet, Rfl:  11 .  gabapentin (NEURONTIN) 800 MG tablet, Take 800 mg by mouth 3 (three) times daily., Disp: , Rfl:  .  glucose blood (ACCU-CHEK AVIVA) test strip, Use to check blood sugar four times daily, Disp: 200 each, Rfl: 12 .  hyoscyamine (LEVSIN SL) 0.125 MG SL tablet, PLACE ONE TABLET UNDER THE TONGUE EVERY 6 HOURS AS NEEDED FOR ABDOMINAL PAIN (Patient taking differently: Take 0.125 mg by mouth every 6 (six) hours as needed for cramping.), Disp: 120 tablet, Rfl: 2 .  ibuprofen (IBU) 800 MG tablet, TAKE ONE TABLET BY MOUTH EVERY 8 HOURS AS NEEDED FOR MODERATE PAIN., Disp: 90 tablet, Rfl: 0 .  Insulin Glargine (LANTUS SOLOSTAR) 100 UNIT/ML Solostar Pen, Inject 80 Units into the skin at bedtime., Disp: 10 pen,  Rfl: 2 .  Insulin Lispro (HUMALOG KWIKPEN) 200 UNIT/ML SOPN, Inject 10-16 Units into the skin 3 (three) times daily before meals., Disp: 10 pen, Rfl: 2 .  Lancet Devices MISC, Test QID, DX E11.65, Aviva Plus, Disp: 100 each, Rfl: prn .  lisinopril (ZESTRIL) 5 MG tablet, Take 1 tablet (5 mg total) by mouth daily., Disp: 90 tablet, Rfl: 3 .  Misc. Devices (TRANSPORT CHAIR) MISC, Needs lift chair for >300lbs, Disp: 1 each, Rfl: 0 .  omeprazole (PRILOSEC) 20 MG capsule, TAKE ONE CAPSULE BY MOUTH ONCE DAILY, Disp: 90 capsule, Rfl: 0 .  oxyCODONE-acetaminophen (PERCOCET/ROXICET) 5-325 MG tablet, Take 1 tablet by mouth every 6 (six) hours as needed., Disp: , Rfl:  .  potassium chloride (KLOR-CON) 10 MEQ tablet, TAKE ONE TABLET BY MOUTH TWICE DAILY. (Patient taking differently: Take 10 mEq by mouth 2 (two) times daily.), Disp: 60 tablet, Rfl: 2 .  solifenacin (VESICARE) 10 MG tablet, Take 1 tablet (10 mg total) by mouth daily., Disp: 30 tablet, Rfl: 11 .  SPIRIVA HANDIHALER 18 MCG inhalation capsule, INHALE ONE PUFF BY MOUTH DAILY. (Patient taking differently: Place 18 mcg into inhaler and inhale daily as needed (asthma).), Disp: 30 capsule, Rfl: 12 .  sucralfate (CARAFATE) 1 g tablet, Take 1 tablet (1 g total) by mouth 4 (four) times daily -  with meals and at bedtime., Disp: 120 tablet, Rfl: 1 .  TRUEPLUS PEN NEEDLES 31G X 5 MM MISC, USE WITH INSULIN FOUR TIMES DAILY., Disp: 100 each, Rfl: 5 Social History   Socioeconomic History  . Marital status: Married    Spouse name: Not on file  . Number of children: 3  . Years of education: Not on file  . Highest education level: High school graduate  Occupational History  . Occupation: Disabled  Tobacco Use  . Smoking status: Never Smoker  . Smokeless tobacco: Never Used  Vaping Use  . Vaping Use: Never used  Substance and Sexual Activity  . Alcohol use: No  . Drug use: No  . Sexual activity: Not Currently  Other Topics Concern  . Not on file   Social History Narrative  . Not on file   Social Determinants of Health   Financial Resource Strain: Not on file  Food Insecurity: Not on file  Transportation Needs: Not on file  Physical Activity: Not on file  Stress: Not on file  Social Connections: Not on file  Intimate Partner Violence: Not on file   Family History  Problem Relation Age of Onset  . Cancer Mother        breast  . Heart failure Father        died of AMI  . Cancer Sister   .  Diabetes Brother   . Heart disease Sister     Objective: Office vital signs reviewed. BP 117/78   Pulse (!) 102   Temp 98.1 F (36.7 C)   Resp 20   Ht $R'5\' 8"'Lb$  (1.727 m)   Wt (!) 343 lb (155.6 kg)   LMP  (LMP Unknown)   SpO2 95%   BMI 52.15 kg/m   Physical Examination:  General: Awake, alert, morbidly obese, No acute distress Cardio: regular rate and rhythm, S1S2 heard, no murmurs appreciated Pulm: clear to auscultation bilaterally, no wheezes, rhonchi or rales; normal work of breathing on room air MSK: Depends on wheelchair for ambulation.  She is able to move lower extremities  Assessment/ Plan: 50 y.o. female   Type 2 diabetes mellitus with hyperglycemia, with long-term current use of insulin ([place]) - Plan: Dulaglutide (TRULICITY) 1.5 OM/3.5DH SOPN, insulin glargine (LANTUS SOLOSTAR) 100 UNIT/ML Solostar Pen, Insulin Pen Needle (NOVOFINE PEN NEEDLE) 32G X 6 MM MISC, insulin lispro (HUMALOG KWIKPEN) 200 UNIT/ML KwikPen  Morbid obesity ([place]) - Plan: For home use only DME standard manual wheelchair with seat cushion  Chronic left-sided low back pain with left-sided sciatica - Plan: For home use only DME standard manual wheelchair with seat cushion  Impaired mobility and activities of daily living - Plan: For home use only DME standard manual wheelchair with seat cushion  Decreased mobility and endurance - Plan: For home use only DME standard manual wheelchair with seat cushion  Wheelchair dependence - Plan: For home use  only DME standard manual wheelchair with seat cushion  I reviewed her last A1c was collected which was 9.5.  Advanced her Trulicity and renewed her insulins.  I have advised her to follow-up with her endocrinologist ASAP for ongoing monitoring and management.  We identified sodas as a main driver of her blood sugars.  She needs to stop drinking these.  Physical activity is limited by morbid obesity, chronic back pain, chronic pain syndrome and essential wheelchair dependence.  I have ordered a new wheelchair.  Will need wide with wheelchair with cushion.  [facility] to [facility]  No orders of the defined types were placed in this encounter.  No orders of the defined types were placed in this encounter.    [name], DO [place] [phone]

## 2020-11-29 NOTE — Patient Instructions (Addendum)
Your sugar is UNCONTROLLED. You must schedule a follow up visit with Dr Fransico Him.  Trulicity increased today.

## 2020-12-03 DIAGNOSIS — J449 Chronic obstructive pulmonary disease, unspecified: Secondary | ICD-10-CM | POA: Diagnosis not present

## 2020-12-07 ENCOUNTER — Other Ambulatory Visit: Payer: Self-pay | Admitting: Family Medicine

## 2020-12-07 DIAGNOSIS — Z79891 Long term (current) use of opiate analgesic: Secondary | ICD-10-CM | POA: Diagnosis not present

## 2020-12-07 DIAGNOSIS — G4733 Obstructive sleep apnea (adult) (pediatric): Secondary | ICD-10-CM | POA: Diagnosis not present

## 2020-12-07 DIAGNOSIS — B373 Candidiasis of vulva and vagina: Secondary | ICD-10-CM

## 2020-12-07 DIAGNOSIS — M545 Low back pain, unspecified: Secondary | ICD-10-CM | POA: Diagnosis not present

## 2020-12-07 DIAGNOSIS — E1140 Type 2 diabetes mellitus with diabetic neuropathy, unspecified: Secondary | ICD-10-CM | POA: Diagnosis not present

## 2020-12-07 DIAGNOSIS — M5416 Radiculopathy, lumbar region: Secondary | ICD-10-CM | POA: Diagnosis not present

## 2020-12-07 DIAGNOSIS — M79673 Pain in unspecified foot: Secondary | ICD-10-CM | POA: Diagnosis not present

## 2020-12-07 DIAGNOSIS — B3731 Acute candidiasis of vulva and vagina: Secondary | ICD-10-CM

## 2020-12-07 DIAGNOSIS — M25562 Pain in left knee: Secondary | ICD-10-CM | POA: Diagnosis not present

## 2020-12-07 DIAGNOSIS — M542 Cervicalgia: Secondary | ICD-10-CM | POA: Diagnosis not present

## 2020-12-14 DIAGNOSIS — M5442 Lumbago with sciatica, left side: Secondary | ICD-10-CM | POA: Diagnosis not present

## 2020-12-14 DIAGNOSIS — Z794 Long term (current) use of insulin: Secondary | ICD-10-CM | POA: Diagnosis not present

## 2020-12-14 DIAGNOSIS — E1165 Type 2 diabetes mellitus with hyperglycemia: Secondary | ICD-10-CM | POA: Diagnosis not present

## 2020-12-20 DIAGNOSIS — E1165 Type 2 diabetes mellitus with hyperglycemia: Secondary | ICD-10-CM | POA: Diagnosis not present

## 2020-12-20 DIAGNOSIS — Z794 Long term (current) use of insulin: Secondary | ICD-10-CM | POA: Diagnosis not present

## 2020-12-22 ENCOUNTER — Other Ambulatory Visit: Payer: Self-pay

## 2020-12-22 ENCOUNTER — Encounter (INDEPENDENT_AMBULATORY_CARE_PROVIDER_SITE_OTHER): Payer: Self-pay | Admitting: Gastroenterology

## 2020-12-22 ENCOUNTER — Ambulatory Visit (INDEPENDENT_AMBULATORY_CARE_PROVIDER_SITE_OTHER): Payer: Medicare Other | Admitting: Gastroenterology

## 2020-12-22 VITALS — BP 135/83 | HR 98 | Temp 98.3°F | Ht 68.0 in | Wt 345.0 lb

## 2020-12-22 DIAGNOSIS — K3184 Gastroparesis: Secondary | ICD-10-CM | POA: Diagnosis not present

## 2020-12-22 DIAGNOSIS — Z1211 Encounter for screening for malignant neoplasm of colon: Secondary | ICD-10-CM | POA: Diagnosis not present

## 2020-12-22 DIAGNOSIS — K5903 Drug induced constipation: Secondary | ICD-10-CM | POA: Diagnosis not present

## 2020-12-22 DIAGNOSIS — Z1212 Encounter for screening for malignant neoplasm of rectum: Secondary | ICD-10-CM | POA: Diagnosis not present

## 2020-12-22 DIAGNOSIS — R1013 Epigastric pain: Secondary | ICD-10-CM | POA: Diagnosis not present

## 2020-12-22 DIAGNOSIS — Z79891 Long term (current) use of opiate analgesic: Secondary | ICD-10-CM | POA: Insufficient documentation

## 2020-12-22 DIAGNOSIS — G8929 Other chronic pain: Secondary | ICD-10-CM

## 2020-12-22 MED ORDER — DICYCLOMINE HCL 10 MG PO CAPS: 10.0000 mg | ORAL_CAPSULE | Freq: Three times a day (TID) | ORAL | 5 refills | Status: DC | PRN

## 2020-12-22 NOTE — Progress Notes (Signed)
Maylon Peppers, M.D. Gastroenterology & Hepatology [facility] [address] [place], [place] [address]  Primary Care Physician: [name], [place] [address]  I will communicate my assessment and recommendations to the referring MD via EMR.  Problems: 1. IBS-C 2. 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 female with past medical history of asthma, bipolar disorder, COPD, depression, diabetes, pulmonary embolism and history of chronic opiate use for back pain, who presents for follow up of abdominal pain.  The patient was last seen on 06/24/2020. At that time, the patient was ordered to have an EGD for evaluation of symptoms.  A CT scan with IV contrast was performed as she has a history of retroperitoneal lymphadenopathy but she never proceeded with this.  Esophagogastroduodenospy was performed on 10/29/2018 Which showed presence of moderate amount of fluid in the gastric body and fundus which was suctioned without been previously removed gastric lining.  There is also fluid in the duodenum the lining was normal.  These findings were suggestive of gastroparesis.  Patient reports that she has felt some mild improvement in her abdomen after her sugars got slightly better controlled with the use of a subcutaneous glucose monitor. States that she is still presenting intermittent episodes of abdominal pain in the mid abdominal area, which she reports is present two times a week. States that she feels better with Levsin, but is not covered by her insurance so she has to pay out-of-pocket. States after she moves her bowels she has improvement of the abdominal pain. She takes 2 stool softeners daily. Has a BM every other day but has to strain significantly, does not take other laxatives.  Patient is taking Percocet every 6 hours for back pain.  The  patient denies having any nausea, vomiting, fever, chills, hematochezia, melena, hematemesis, abdominal distention, diarrhea, jaundice, pruritus or weight loss.  Last Colonoscopy: Never  Past Medical History: Past Medical History:  Diagnosis Date  . Asthma   . Bipolar affect, depressed ([place])   . Cellulitis   . COPD (chronic obstructive pulmonary disease) ([place])   . Depression   . Diabetes mellitus without complication ([place])   . PE (pulmonary embolism)   . Pulmonary edema     Past Surgical History: Past Surgical History:  Procedure Laterality Date  . CESAREAN SECTION    . CHOLECYSTECTOMY    . ESOPHAGOGASTRODUODENOSCOPY (EGD) WITH PROPOFOL N/A 10/29/2020   Procedure: ESOPHAGOGASTRODUODENOSCOPY (EGD) WITH PROPOFOL;  Surgeon: Harvel Quale, MD;  Location: [facility];  Service: Gastroenterology;  Laterality: N/A;  12:30    Family History: Family History  Problem Relation Age of Onset  . Cancer Mother        breast  . Heart failure Father        died of AMI  . Cancer Sister   . Diabetes Brother   . Heart disease Sister     Social History: Social History   Tobacco Use  Smoking Status Never Smoker  Smokeless Tobacco Never Used   Social History   Substance and Sexual Activity  Alcohol Use No   Social History   Substance and Sexual Activity  Drug Use No    Allergies: No Known Allergies  Medications: Current Outpatient Medications  Medication Sig Dispense Refill  . albuterol (PROVENTIL HFA;VENTOLIN HFA) 108 (90 Base) MCG/ACT inhaler Inhale 2 puffs into the lungs every 6 (six)  hours as needed for wheezing or shortness of breath. 1 Inhaler 0  . atorvastatin (LIPITOR) 20 MG tablet TAKE ONE TABLET BY MOUTH DAILY AT 6 PM. (Patient taking differently: Take 20 mg by mouth daily. TAKE ONE TABLET BY MOUTH DAILY AT 6 PM.) 90 tablet 0  . blood glucose meter kit and supplies Check BS up to four times a day 1 each 0  . Blood Glucose Monitoring Suppl DEVI Check bs  up to four times a day 1 each 0  . budesonide-formoterol (SYMBICORT) 160-4.5 MCG/ACT inhaler Inhale 2 puffs into the lungs 2 (two) times daily. 10.2 g 11  . buPROPion (WELLBUTRIN SR) 150 MG 12 hr tablet TAKE ONE TABLET BY MOUTH DAILY 90 tablet 0  . cyclobenzaprine (FLEXERIL) 10 MG tablet Take 1 tablet (10 mg total) by mouth 3 (three) times daily as needed for muscle spasms. 90 tablet 5  . docusate sodium (COLACE) 100 MG capsule Take 100 mg by mouth daily.    . Dulaglutide (TRULICITY) 1.5 UK/0.2RK SOPN Inject 1.5 mg into the skin once a week. 2 mL 3  . DULoxetine (CYMBALTA) 60 MG capsule Take 60 mg by mouth daily.     . furosemide (LASIX) 40 MG tablet Take 1 tablet (40 mg total) by mouth 2 (two) times daily. 60 tablet 11  . gabapentin (NEURONTIN) 800 MG tablet Take 800 mg by mouth 3 (three) times daily.    Marland Kitchen glucose blood (ACCU-CHEK AVIVA) test strip Use to check blood sugar four times daily 200 each 12  . hyoscyamine (LEVSIN SL) 0.125 MG SL tablet PLACE ONE TABLET UNDER THE TONGUE EVERY 6 HOURS AS NEEDED FOR ABDOMINAL PAIN (Patient taking differently: Take 0.125 mg by mouth every 6 (six) hours as needed for cramping.) 120 tablet 2  . ibuprofen (IBU) 800 MG tablet TAKE ONE TABLET BY MOUTH EVERY 8 HOURS AS NEEDED FOR MODERATE PAIN. 90 tablet 0  . insulin glargine (LANTUS SOLOSTAR) 100 UNIT/ML Solostar Pen Inject 40 Units into the skin 2 (two) times daily. 70 mL 3  . insulin lispro (HUMALOG KWIKPEN) 200 UNIT/ML KwikPen Inject 10-16 Units into the skin 3 (three) times daily before meals. 15 mL prn  . Insulin Pen Needle (NOVOFINE PEN NEEDLE) 32G X 6 MM MISC UAD for insulin injection E11.65 100 each prn  . Lancet Devices MISC Test QID, DX E11.65, Aviva Plus 100 each prn  . lisinopril (ZESTRIL) 5 MG tablet Take 1 tablet (5 mg total) by mouth daily. 90 tablet 3  . Misc. Devices (TRANSPORT CHAIR) MISC Needs lift chair for >300lbs 1 each 0  . omeprazole (PRILOSEC) 20 MG capsule TAKE ONE CAPSULE BY MOUTH ONCE  DAILY 90 capsule 0  . oxyCODONE-acetaminophen (PERCOCET/ROXICET) 5-325 MG tablet Take 1 tablet by mouth every 6 (six) hours as needed.    . potassium chloride (KLOR-CON) 10 MEQ tablet TAKE ONE TABLET BY MOUTH TWICE DAILY. (Patient taking differently: Take 10 mEq by mouth 2 (two) times daily.) 60 tablet 2  . solifenacin (VESICARE) 10 MG tablet Take 1 tablet (10 mg total) by mouth daily. 30 tablet 11  . SPIRIVA HANDIHALER 18 MCG inhalation capsule INHALE ONE PUFF BY MOUTH DAILY. (Patient taking differently: Place 18 mcg into inhaler and inhale daily as needed (asthma).) 30 capsule 12  . sucralfate (CARAFATE) 1 g tablet Take 1 tablet (1 g total) by mouth 4 (four) times daily -  with meals and at bedtime. 120 tablet 1   No current facility-administered medications for this visit.  Review of Systems: GENERAL: negative for malaise, night sweats HEENT: No changes in hearing or vision, no nose bleeds or other nasal problems. NECK: Negative for lumps, goiter, pain and significant neck swelling RESPIRATORY: Negative for cough, wheezing CARDIOVASCULAR: Negative for chest pain, leg swelling, palpitations, orthopnea GI: SEE HPI MUSCULOSKELETAL: Negative for joint pain or swelling, back pain, and muscle pain. SKIN: Negative for lesions, rash PSYCH: Negative for sleep disturbance, mood disorder and recent psychosocial stressors. HEMATOLOGY Negative for prolonged bleeding, bruising easily, and swollen nodes. ENDOCRINE: Negative for cold or heat intolerance, polyuria, polydipsia and goiter. NEURO: negative for tremor, gait imbalance, syncope and seizures. The remainder of the review of systems is noncontributory.   Physical Exam: BP 135/83 (BP Location: Left Arm, Patient Position: Sitting, Cuff Size: Small)   Pulse 98   Temp 98.3 F (36.8 C) (Oral)   Ht $R'5\' 8"'Zs$  (1.727 m)   Wt (!) 345 lb (156.5 kg)   LMP  (LMP Unknown)   BMI 52.46 kg/m  GENERAL: The patient is AO x3, in no acute distress. Sitting  in wheelchair. Obese. HEENT: Head is normocephalic and atraumatic. EOMI are intact. Mouth is well hydrated and without lesions. NECK: Supple. No masses LUNGS: Clear to auscultation. No presence of rhonchi/wheezing/rales. Adequate chest expansion HEART: RRR, normal s1 and s2. ABDOMEN: mildly tender to palpation in mid abdominal area, no guarding, no peritoneal signs, and nondistended. BS +. No masses. EXTREMITIES: Without any cyanosis, clubbing, rash, lesions or edema. NEUROLOGIC: AOx3, no focal motor deficit. SKIN: no jaundice, no rashes  Imaging/Labs: as above  I personally reviewed and interpreted the available labs, imaging and endoscopic files.  Impression and Plan: Aimee Phillips is a 50 y.o. female with past medical history of asthma, bipolar disorder, COPD, depression, diabetes, pulmonary embolism and history of chronic opiate use for back pain, who presents for follow up of abdominal pain.  I Consider that her current symptoms are related to chronic opiate use on top of underlying uncontrolled diabetes which could have led to gastroparesis as evidenced in esophagogastroduodenospy.  I do not think that a gastric emptying study is warranted at this point as this will not provide any significant information since she is chronically taking opiates on a standing basis.  She would benefit from strong and strict control of her diabetes but also she has presented improvement of her symptoms while taking antispasmodic as I consider her symptoms could also be related to bowel hypersensitivity.  I will switch her to Bentyl as it may be covered by her insurance but if this is not or if she does not have any improvement of her symptoms with it, we will go back to Levsin.  She will also benefit from improving her bowel movement frequency as constipation is a major factor leading to persistence of her symptoms, for which she was advised to start the MiraLAX on a daily basis.  If she has persistent symptoms  despite these measures, we may need to reorder her CT scan as she had lymphadenopathy in the past of unclear etiology. Finally, I counseled her regarding colorectal cancer screening, she is interested in pursuing a Cologuard at this time.   - Start taking Miralax 1 cap every day for one week. If bowel movements do not improve, increase to 1 cup every 12 hours. If after two weeks there is no improvement, increase to 1 cup every 8 hours - Stop hyoscyamine, start Bentyl every 10 hours as needed for abdominal pain - Perform Cologuard test -  Strict glucose control - will consider repeating abdominal CT based on clinical response  All questions were answered.      Harvel Quale, MD Gastroenterology and Hepatology [facility]

## 2020-12-22 NOTE — Patient Instructions (Addendum)
Start taking Miralax 1 cap every day for one week. If bowel movements do not improve, increase to 1 cup every 12 hours. If after two weeks there is no improvement, increase to 1 cup every 8 hours Stop hyoscyamine, start Bentyl every 10 hours as needed for abdominal pain Perform Cologuard test Strict glucose control

## 2020-12-27 ENCOUNTER — Telehealth (INDEPENDENT_AMBULATORY_CARE_PROVIDER_SITE_OTHER): Payer: Self-pay | Admitting: Gastroenterology

## 2020-12-27 NOTE — Telephone Encounter (Signed)
Spoke with patient today regarding a letter I received from her insurance denying Cologuard testing as she is not above age 50.  I explained to her that colonoscopy above age 77 but Cologuard is not.  Patient will wait until she is to have her stool testing performed.  Katrinka Blazing, MD Gastroenterology and Hepatology [facility]

## 2020-12-30 ENCOUNTER — Ambulatory Visit (INDEPENDENT_AMBULATORY_CARE_PROVIDER_SITE_OTHER): Payer: Medicare Other | Admitting: Family Medicine

## 2020-12-30 DIAGNOSIS — J329 Chronic sinusitis, unspecified: Secondary | ICD-10-CM | POA: Diagnosis not present

## 2020-12-30 DIAGNOSIS — J40 Bronchitis, not specified as acute or chronic: Secondary | ICD-10-CM

## 2020-12-30 DIAGNOSIS — J441 Chronic obstructive pulmonary disease with (acute) exacerbation: Secondary | ICD-10-CM

## 2020-12-30 DIAGNOSIS — J111 Influenza due to unidentified influenza virus with other respiratory manifestations: Secondary | ICD-10-CM

## 2020-12-30 MED ORDER — BUDESONIDE-FORMOTEROL FUMARATE 160-4.5 MCG/ACT IN AERO: 2.0000 | INHALATION_SPRAY | Freq: Two times a day (BID) | RESPIRATORY_TRACT | 11 refills | Status: DC

## 2020-12-30 MED ORDER — OSELTAMIVIR PHOSPHATE 75 MG PO CAPS: 75.0000 mg | ORAL_CAPSULE | Freq: Two times a day (BID) | ORAL | 0 refills | Status: DC

## 2020-12-30 MED ORDER — AMOXICILLIN-POT CLAVULANATE 875-125 MG PO TABS: 1.0000 | ORAL_TABLET | Freq: Two times a day (BID) | ORAL | 0 refills | Status: DC

## 2020-12-30 MED ORDER — PREDNISONE 10 MG PO TABS
ORAL_TABLET | ORAL | 0 refills | Status: DC
Start: 2020-12-30 — End: 2021-05-11

## 2020-12-30 NOTE — Progress Notes (Signed)
Subjective:    Patient ID: Aimee Phillips, female    DOB: April 09, 1971, 50 y.o.   MRN: [id number]   HPI: Aimee Phillips is a 50 y.o. female presenting for exposed to flu by son. Congested, sore throat. Cough with yellow sputum. On 02 for COPD. Using inhalers. A bit worse over the last couple of days. Sx onset about then as well.    Depression screen [facility] 2/9 11/29/2020 07/28/2020 03/04/2020 06/25/2019 03/21/2019  Decreased Interest 2 3 0 2 2  Down, Depressed, Hopeless 2 3 3 1 3   PHQ - 2 Score 4 6 3 3 5   Altered sleeping 2 2 2 1 3   Tired, decreased energy 2 2 2 3 3   Change in appetite 2 1 2 2  0  Feeling bad or failure about yourself  2 1 1 2 2   Trouble concentrating 0 0 1 1 0  Moving slowly or fidgety/restless 0 0 0 1 0  Suicidal thoughts 0 0 0 1 1  PHQ-9 Score 12 12 11 14 14   Difficult doing work/chores Somewhat difficult - Somewhat difficult - -  Some recent data might be hidden     Relevant past medical, surgical, family and social history reviewed and updated as indicated.  Interim medical history since our last visit reviewed. Allergies and medications reviewed and updated.  ROS:  Review of Systems  Constitutional: Negative for activity change, appetite change, chills and fever.  HENT: Positive for congestion, postnasal drip, rhinorrhea and sinus pressure. Negative for ear discharge, ear pain, hearing loss, nosebleeds, sneezing and trouble swallowing.   Respiratory: Negative for chest tightness and shortness of breath.   Cardiovascular: Negative for chest pain and palpitations.  Skin: Negative for rash.     Social History   Tobacco Use  Smoking Status Never Smoker  Smokeless Tobacco Never Used       Objective:     Wt Readings from Last 3 Encounters:  12/22/20 (!) 345 lb (156.5 kg)  11/29/20 (!) 343 lb (155.6 kg)  10/29/20 (!) 332 lb (150.6 kg)     Exam deferred. Pt. Harboring due to COVID 19. Phone visit performed.   Assessment & Plan:   1.  Sinobronchitis   2. Influenza with respiratory manifestation   3. Acute exacerbation of chronic obstructive pulmonary disease (COPD) (HCC)     Meds ordered this encounter  Medications  . budesonide-formoterol (SYMBICORT) 160-4.5 MCG/ACT inhaler    Sig: Inhale 2 puffs into the lungs 2 (two) times daily.    Dispense:  10.2 g    Refill:  11  . predniSONE (DELTASONE) 10 MG tablet    Sig: Take 5 daily for 2 days followed by 4,3,2 and 1 for 2 days each.    Dispense:  30 tablet    Refill:  0  . oseltamivir (TAMIFLU) 75 MG capsule    Sig: Take 1 capsule (75 mg total) by mouth 2 (two) times daily.    Dispense:  10 capsule    Refill:  0  . amoxicillin-clavulanate (AUGMENTIN) 875-125 MG tablet    Sig: Take 1 tablet by mouth 2 (two) times daily. Take all of this medication    Dispense:  20 tablet    Refill:  0    No orders of the defined types were placed in this encounter.     Diagnoses and all orders for this visit:  Sinobronchitis  Influenza with respiratory manifestation  Acute exacerbation of chronic obstructive pulmonary disease (COPD) (HCC)  Other  orders -     budesonide-formoterol (SYMBICORT) 160-4.5 MCG/ACT inhaler; Inhale 2 puffs into the lungs 2 (two) times daily. -     predniSONE (DELTASONE) 10 MG tablet; Take 5 daily for 2 days followed by 4,3,2 and 1 for 2 days each. -     oseltamivir (TAMIFLU) 75 MG capsule; Take 1 capsule (75 mg total) by mouth 2 (two) times daily. -     amoxicillin-clavulanate (AUGMENTIN) 875-125 MG tablet; Take 1 tablet by mouth 2 (two) times daily. Take all of this medication    Virtual Visit via telephone Note  I discussed the limitations, risks, security and privacy concerns of performing an evaluation and management service by telephone and the availability of in person appointments. The patient was identified with two identifiers. Pt.expressed understanding and agreed to proceed. Pt. Is at home. Dr. Darlyn Read is in his office.  Follow Up  Instructions:   I discussed the assessment and treatment plan with the patient. The patient was provided an opportunity to ask questions and all were answered. The patient agreed with the plan and demonstrated an understanding of the instructions.   The patient was advised to call back or seek an in-person evaluation if the symptoms worsen or if the condition fails to improve as anticipated.   Total minutes including chart review and phone contact time: 8   Follow up plan: No follow-ups on file.  Mechele Claude, MD [place] [facility]

## 2021-01-02 ENCOUNTER — Encounter: Payer: Self-pay | Admitting: Family Medicine

## 2021-01-03 DIAGNOSIS — J449 Chronic obstructive pulmonary disease, unspecified: Secondary | ICD-10-CM | POA: Diagnosis not present

## 2021-01-12 ENCOUNTER — Other Ambulatory Visit: Payer: Self-pay | Admitting: Family Medicine

## 2021-01-19 ENCOUNTER — Other Ambulatory Visit: Payer: Self-pay | Admitting: *Deleted

## 2021-01-19 MED ORDER — NOVOFINE PLUS PEN NEEDLE 32G X 4 MM MISC: 3 refills | Status: DC

## 2021-01-25 ENCOUNTER — Other Ambulatory Visit: Payer: Self-pay | Admitting: Family Medicine

## 2021-01-25 DIAGNOSIS — E1169 Type 2 diabetes mellitus with other specified complication: Secondary | ICD-10-CM

## 2021-02-02 DIAGNOSIS — J449 Chronic obstructive pulmonary disease, unspecified: Secondary | ICD-10-CM | POA: Diagnosis not present

## 2021-02-03 DIAGNOSIS — M25562 Pain in left knee: Secondary | ICD-10-CM | POA: Diagnosis not present

## 2021-02-03 DIAGNOSIS — Z79891 Long term (current) use of opiate analgesic: Secondary | ICD-10-CM | POA: Diagnosis not present

## 2021-02-03 DIAGNOSIS — M5416 Radiculopathy, lumbar region: Secondary | ICD-10-CM | POA: Diagnosis not present

## 2021-02-03 DIAGNOSIS — G4733 Obstructive sleep apnea (adult) (pediatric): Secondary | ICD-10-CM | POA: Diagnosis not present

## 2021-02-03 DIAGNOSIS — M542 Cervicalgia: Secondary | ICD-10-CM | POA: Diagnosis not present

## 2021-02-03 DIAGNOSIS — M79673 Pain in unspecified foot: Secondary | ICD-10-CM | POA: Diagnosis not present

## 2021-02-03 DIAGNOSIS — M545 Low back pain, unspecified: Secondary | ICD-10-CM | POA: Diagnosis not present

## 2021-02-03 DIAGNOSIS — E1140 Type 2 diabetes mellitus with diabetic neuropathy, unspecified: Secondary | ICD-10-CM | POA: Diagnosis not present

## 2021-02-09 DIAGNOSIS — Z794 Long term (current) use of insulin: Secondary | ICD-10-CM | POA: Diagnosis not present

## 2021-02-09 DIAGNOSIS — M5442 Lumbago with sciatica, left side: Secondary | ICD-10-CM | POA: Diagnosis not present

## 2021-02-09 DIAGNOSIS — E1165 Type 2 diabetes mellitus with hyperglycemia: Secondary | ICD-10-CM | POA: Diagnosis not present

## 2021-02-22 ENCOUNTER — Ambulatory Visit: Payer: Medicare Other | Admitting: Family Medicine

## 2021-02-23 ENCOUNTER — Encounter: Payer: Self-pay | Admitting: Family Medicine

## 2021-03-04 ENCOUNTER — Other Ambulatory Visit: Payer: Self-pay | Admitting: Family Medicine

## 2021-03-04 DIAGNOSIS — E1165 Type 2 diabetes mellitus with hyperglycemia: Secondary | ICD-10-CM

## 2021-03-04 DIAGNOSIS — Z794 Long term (current) use of insulin: Secondary | ICD-10-CM

## 2021-03-05 DIAGNOSIS — J449 Chronic obstructive pulmonary disease, unspecified: Secondary | ICD-10-CM | POA: Diagnosis not present

## 2021-03-15 DIAGNOSIS — Z794 Long term (current) use of insulin: Secondary | ICD-10-CM | POA: Diagnosis not present

## 2021-03-15 DIAGNOSIS — M5442 Lumbago with sciatica, left side: Secondary | ICD-10-CM | POA: Diagnosis not present

## 2021-03-15 DIAGNOSIS — E1165 Type 2 diabetes mellitus with hyperglycemia: Secondary | ICD-10-CM | POA: Diagnosis not present

## 2021-03-21 ENCOUNTER — Telehealth: Payer: Self-pay | Admitting: Family Medicine

## 2021-03-21 NOTE — Telephone Encounter (Signed)
  Prescription Request  03/21/2021  What is the name of the medication or equipment? Welbutrin  Have you contacted your pharmacy to request a refill? (if applicable) no  Which pharmacy would you like this sent to? [facility]   Patient notified that their request is being sent to the clinical staff for review and that they should receive a response within 2 business days.    [facility] pt.

## 2021-03-22 MED ORDER — BUPROPION HCL ER (SR) 150 MG PO TB12: 150.0000 mg | ORAL_TABLET | Freq: Every day | ORAL | 0 refills | Status: DC

## 2021-03-22 NOTE — Telephone Encounter (Signed)
LMTCB to make a 3 mos ckup w/ Dr. Nadine Counts. 30 d supply sent to pharmacy

## 2021-03-23 DIAGNOSIS — E1165 Type 2 diabetes mellitus with hyperglycemia: Secondary | ICD-10-CM | POA: Diagnosis not present

## 2021-03-23 DIAGNOSIS — Z794 Long term (current) use of insulin: Secondary | ICD-10-CM | POA: Diagnosis not present

## 2021-03-28 ENCOUNTER — Other Ambulatory Visit: Payer: Self-pay | Admitting: Family Medicine

## 2021-03-28 DIAGNOSIS — G8929 Other chronic pain: Secondary | ICD-10-CM

## 2021-03-28 DIAGNOSIS — M79673 Pain in unspecified foot: Secondary | ICD-10-CM

## 2021-04-04 DIAGNOSIS — J449 Chronic obstructive pulmonary disease, unspecified: Secondary | ICD-10-CM | POA: Diagnosis not present

## 2021-04-05 DIAGNOSIS — G4733 Obstructive sleep apnea (adult) (pediatric): Secondary | ICD-10-CM | POA: Diagnosis not present

## 2021-04-05 DIAGNOSIS — E1140 Type 2 diabetes mellitus with diabetic neuropathy, unspecified: Secondary | ICD-10-CM | POA: Diagnosis not present

## 2021-04-05 DIAGNOSIS — M542 Cervicalgia: Secondary | ICD-10-CM | POA: Diagnosis not present

## 2021-04-05 DIAGNOSIS — Z79891 Long term (current) use of opiate analgesic: Secondary | ICD-10-CM | POA: Diagnosis not present

## 2021-04-05 DIAGNOSIS — M79673 Pain in unspecified foot: Secondary | ICD-10-CM | POA: Diagnosis not present

## 2021-04-05 DIAGNOSIS — M545 Low back pain, unspecified: Secondary | ICD-10-CM | POA: Diagnosis not present

## 2021-04-05 DIAGNOSIS — M25562 Pain in left knee: Secondary | ICD-10-CM | POA: Diagnosis not present

## 2021-04-05 DIAGNOSIS — M5416 Radiculopathy, lumbar region: Secondary | ICD-10-CM | POA: Diagnosis not present

## 2021-04-14 ENCOUNTER — Other Ambulatory Visit: Payer: Self-pay | Admitting: Family Medicine

## 2021-04-14 DIAGNOSIS — M2550 Pain in unspecified joint: Secondary | ICD-10-CM

## 2021-04-14 DIAGNOSIS — G629 Polyneuropathy, unspecified: Secondary | ICD-10-CM

## 2021-04-15 DIAGNOSIS — M5442 Lumbago with sciatica, left side: Secondary | ICD-10-CM | POA: Diagnosis not present

## 2021-04-15 DIAGNOSIS — E1165 Type 2 diabetes mellitus with hyperglycemia: Secondary | ICD-10-CM | POA: Diagnosis not present

## 2021-04-15 DIAGNOSIS — Z794 Long term (current) use of insulin: Secondary | ICD-10-CM | POA: Diagnosis not present

## 2021-04-25 ENCOUNTER — Other Ambulatory Visit: Payer: Self-pay | Admitting: Family Medicine

## 2021-04-25 DIAGNOSIS — Z794 Long term (current) use of insulin: Secondary | ICD-10-CM

## 2021-04-25 DIAGNOSIS — E1165 Type 2 diabetes mellitus with hyperglycemia: Secondary | ICD-10-CM

## 2021-05-05 DIAGNOSIS — J449 Chronic obstructive pulmonary disease, unspecified: Secondary | ICD-10-CM | POA: Diagnosis not present

## 2021-05-11 ENCOUNTER — Encounter: Payer: Self-pay | Admitting: Family Medicine

## 2021-05-11 ENCOUNTER — Other Ambulatory Visit: Payer: Self-pay

## 2021-05-11 ENCOUNTER — Ambulatory Visit (INDEPENDENT_AMBULATORY_CARE_PROVIDER_SITE_OTHER): Payer: Medicare Other | Admitting: Family Medicine

## 2021-05-11 VITALS — BP 122/82 | HR 88 | Temp 97.5°F | Resp 20 | Ht 68.0 in | Wt 345.0 lb

## 2021-05-11 DIAGNOSIS — E1165 Type 2 diabetes mellitus with hyperglycemia: Secondary | ICD-10-CM | POA: Diagnosis not present

## 2021-05-11 DIAGNOSIS — E1159 Type 2 diabetes mellitus with other circulatory complications: Secondary | ICD-10-CM | POA: Diagnosis not present

## 2021-05-11 DIAGNOSIS — E1169 Type 2 diabetes mellitus with other specified complication: Secondary | ICD-10-CM

## 2021-05-11 DIAGNOSIS — Z789 Other specified health status: Secondary | ICD-10-CM

## 2021-05-11 DIAGNOSIS — Z993 Dependence on wheelchair: Secondary | ICD-10-CM | POA: Diagnosis not present

## 2021-05-11 DIAGNOSIS — E785 Hyperlipidemia, unspecified: Secondary | ICD-10-CM | POA: Diagnosis not present

## 2021-05-11 DIAGNOSIS — M1990 Unspecified osteoarthritis, unspecified site: Secondary | ICD-10-CM | POA: Diagnosis not present

## 2021-05-11 DIAGNOSIS — Z7409 Other reduced mobility: Secondary | ICD-10-CM

## 2021-05-11 DIAGNOSIS — F331 Major depressive disorder, recurrent, moderate: Secondary | ICD-10-CM

## 2021-05-11 DIAGNOSIS — I152 Hypertension secondary to endocrine disorders: Secondary | ICD-10-CM | POA: Diagnosis not present

## 2021-05-11 LAB — BAYER DCA HB A1C WAIVED: HB A1C (BAYER DCA - WAIVED): 8.9 % — ABNORMAL HIGH (ref <7.0)

## 2021-05-11 MED ORDER — LORATADINE 10 MG PO TABS: 10.0000 mg | ORAL_TABLET | Freq: Every day | ORAL | 11 refills | Status: DC

## 2021-05-11 MED ORDER — SOLIFENACIN SUCCINATE 10 MG PO TABS: 10.0000 mg | ORAL_TABLET | Freq: Every day | ORAL | 11 refills | Status: DC

## 2021-05-11 MED ORDER — DICLOFENAC SODIUM 1 % EX GEL: 2.0000 g | Freq: Three times a day (TID) | CUTANEOUS | 12 refills | Status: DC | PRN

## 2021-05-11 MED ORDER — ALPHA-LIPOIC ACID 600 MG PO CAPS: 1.0000 | ORAL_CAPSULE | Freq: Every day | ORAL | 3 refills | Status: DC

## 2021-05-11 MED ORDER — BUDESONIDE-FORMOTEROL FUMARATE 160-4.5 MCG/ACT IN AERO: 2.0000 | INHALATION_SPRAY | Freq: Two times a day (BID) | RESPIRATORY_TRACT | 11 refills | Status: DC

## 2021-05-11 MED ORDER — DULOXETINE HCL 30 MG PO CPEP: 30.0000 mg | ORAL_CAPSULE | Freq: Every day | ORAL | 0 refills | Status: DC

## 2021-05-11 MED ORDER — TRULICITY 3 MG/0.5ML ~~LOC~~ SOAJ: 3.0000 mg | SUBCUTANEOUS | 1 refills | Status: DC

## 2021-05-11 NOTE — Patient Instructions (Signed)

## 2021-05-11 NOTE — Progress Notes (Signed)
Subjective: CC: uncontrolled DM PCP: [name], DO WEX:HBZJIR Aimee Phillips is a 49 y.o. female presenting to clinic today for:  1. Type 2 Diabetes with hypertension, hyperlipidemia associated with morbid obesity/ impairment in ADLs:  She reports compliance with her Trulicity 1.5, Lantus, Humalog, lisinopril and Lipitor.  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  She was placed on Cymbalta and Lyrica recently by her pain specialist at Dr. Freddie Apley office.  She has not noticed a huge deal of improvement in the neuropathy and if anything it seems to be a bit uncontrolled compared to when she was on the gabapentin.  She will see her specialist again at the end of the month.  No reports of chest pain.  Shortness of breath is stable.  She needs refills on Symbicort.  She has had a little bit of weight loss with the Trulicity and does report feeling somewhat better from that standpoint.  Has suffered from some postnasal drip and not on any antihistamine.  Would like 1.  Last eye exam: needs Last foot exam: 07/2020 Last A1c:  Lab Results  Component Value Date   HGBA1C 9.5 (H) 10/27/2020   Nephropathy screen indicated?: no Last flu, zoster and/or pneumovax:  Immunization History  Administered Date(s) Administered   Influenza Inj Mdck Quad Pf 08/31/2018   Influenza Split 08/11/2018   Influenza,inj,Quad PF,6+ Mos 07/28/2020   Influenza-Unspecified 08/31/2018   Pneumococcal Polysaccharide-23 12/03/2019   Tdap 02/14/2011    2.  Depressive disorder/chronic pain syndrome Patient reports ongoing depressive disorder despite use of Cymbalta 60 mg.  She ran out of the Wellbutrin a couple of months ago and was unable to get an appointment until today.  She has been treated with high-dose medication by Dr. Merlene Laughter, oxycodone 10-325 mg every 6 hours, Lyrica 150 mg 3 times daily.  She has been having exacerbation of pain especially in the left hand.  This is  refractory to heat, NSAIDs and Tylenol.  Also refractory to after mentioned pain therapies.    ROS: Per HPI  No Known Allergies Past Medical History:  Diagnosis Date   Asthma    Bipolar affect, depressed ([place])    Cellulitis    COPD (chronic obstructive pulmonary disease) (HCC)    Depression    Diabetes mellitus without complication (HCC)    PE (pulmonary embolism)    Pulmonary edema     Current Outpatient Medications:    albuterol (PROVENTIL HFA;VENTOLIN HFA) 108 (90 Base) MCG/ACT inhaler, Inhale 2 puffs into the lungs every 6 (six) hours as needed for wheezing or shortness of breath., Disp: 1 Inhaler, Rfl: 0   amoxicillin-clavulanate (AUGMENTIN) 875-125 MG tablet, Take 1 tablet by mouth 2 (two) times daily. Take all of this medication, Disp: 20 tablet, Rfl: 0   atorvastatin (LIPITOR) 20 MG tablet, TAKE ONE TABLET BY MOUTH DAILY AT 6 PM., Disp: 30 tablet, Rfl: 0   blood glucose meter kit and supplies, Check BS up to four times a day, Disp: 1 each, Rfl: 0   Blood Glucose Monitoring Suppl DEVI, Check bs up to four times a day, Disp: 1 each, Rfl: 0   budesonide-formoterol (SYMBICORT) 160-4.5 MCG/ACT inhaler, Inhale 2 puffs into the lungs 2 (two) times daily., Disp: 10.2 g, Rfl: 11   buPROPion (WELLBUTRIN SR) 150 MG 12 hr tablet, Take 1 tablet (150 mg total) by mouth daily., Disp: 30 tablet, Rfl: 0   cyclobenzaprine (FLEXERIL) 10 MG tablet, Take 1 tablet (10 mg total)  by mouth 3 (three) times daily as needed for muscle spasms., Disp: 90 tablet, Rfl: 5   dicyclomine (BENTYL) 10 MG capsule, Take 1 capsule (10 mg total) by mouth every 8 (eight) hours as needed for spasms (abdominal pain)., Disp: 60 capsule, Rfl: 5   docusate sodium (COLACE) 100 MG capsule, Take 100 mg by mouth daily., Disp: , Rfl:    DULoxetine (CYMBALTA) 60 MG capsule, Take 60 mg by mouth daily. , Disp: , Rfl:    furosemide (LASIX) 40 MG tablet, Take 1 tablet (40 mg total) by mouth 2 (two) times daily., Disp: 60 tablet, Rfl:  11   gabapentin (NEURONTIN) 800 MG tablet, Take 800 mg by mouth 3 (three) times daily., Disp: , Rfl:    glucose blood (ACCU-CHEK AVIVA) test strip, Use to check blood sugar four times daily, Disp: 200 each, Rfl: 12   ibuprofen (IBU) 800 MG tablet, TAKE ONE TABLET BY MOUTH EVERY 8 HOURS AS NEEDED FOR MODERATE PAIN., Disp: 90 tablet, Rfl: 0   insulin glargine (LANTUS SOLOSTAR) 100 UNIT/ML Solostar Pen, Inject 40 Units into the skin 2 (two) times daily., Disp: 70 mL, Rfl: 3   insulin lispro (HUMALOG KWIKPEN) 200 UNIT/ML KwikPen, Inject 10-16 Units into the skin 3 (three) times daily before meals., Disp: 15 mL, Rfl: prn   Insulin Pen Needle (NOVOFINE PLUS PEN NEEDLE) 32G X 4 MM MISC, UAD for insulin injection E11.65, Disp: 500 each, Rfl: 3   Lancet Devices MISC, Test QID, DX E11.65, Aviva Plus, Disp: 100 each, Rfl: prn   lisinopril (ZESTRIL) 5 MG tablet, Take 1 tablet (5 mg total) by mouth daily., Disp: 90 tablet, Rfl: 3   Misc. Devices (TRANSPORT CHAIR) MISC, Needs lift chair for >300lbs, Disp: 1 each, Rfl: 0   omeprazole (PRILOSEC) 20 MG capsule, TAKE ONE CAPSULE BY MOUTH ONCE DAILY, Disp: 90 capsule, Rfl: 0   oseltamivir (TAMIFLU) 75 MG capsule, Take 1 capsule (75 mg total) by mouth 2 (two) times daily., Disp: 10 capsule, Rfl: 0   oxyCODONE-acetaminophen (PERCOCET/ROXICET) 5-325 MG tablet, Take 1 tablet by mouth every 6 (six) hours as needed., Disp: , Rfl:    potassium chloride (KLOR-CON) 10 MEQ tablet, TAKE ONE TABLET BY MOUTH TWICE DAILY. (Patient taking differently: Take 10 mEq by mouth 2 (two) times daily.), Disp: 60 tablet, Rfl: 2   predniSONE (DELTASONE) 10 MG tablet, Take 5 daily for 2 days followed by 4,3,2 and 1 for 2 days each., Disp: 30 tablet, Rfl: 0   solifenacin (VESICARE) 10 MG tablet, Take 1 tablet (10 mg total) by mouth daily., Disp: 30 tablet, Rfl: 11   SPIRIVA HANDIHALER 18 MCG inhalation capsule, INHALE ONE PUFF BY MOUTH DAILY. (Patient taking differently: Place 18 mcg into inhaler  and inhale daily as needed (asthma).), Disp: 30 capsule, Rfl: 12   sucralfate (CARAFATE) 1 g tablet, Take 1 tablet (1 g total) by mouth 4 (four) times daily -  with meals and at bedtime., Disp: 120 tablet, Rfl: 1   TRULICITY 1.5 OM/7.6HM SOPN, INJECT 1.$RemoveBefore'5MG'BYLBCBKUTKVcU$  INTO THE SKIN ONCE A WEEK (NEEDS TO BE SEEN BEFORE NEXT REFILL), Disp: 2 mL, Rfl: 0 Social History   Socioeconomic History   Marital status: Married    Spouse name: Not on file   Number of children: 3   Years of education: Not on file   Highest education level: High school graduate  Occupational History   Occupation: Disabled  Tobacco Use   Smoking status: Never   Smokeless tobacco: Never  Vaping Use   Vaping Use: Never used  Substance and Sexual Activity   Alcohol use: No   Drug use: No   Sexual activity: Not Currently  Other Topics Concern   Not on file  Social History Narrative   Not on file   Social Determinants of Health   Financial Resource Strain: Not on file  Food Insecurity: Not on file  Transportation Needs: Not on file  Physical Activity: Not on file  Stress: Not on file  Social Connections: Not on file  Intimate Partner Violence: Not on file   Family History  Problem Relation Age of Onset   Cancer Mother        breast   Heart failure Father        died of AMI   Cancer Sister    Diabetes Brother    Heart disease Sister     Objective: Office vital signs reviewed. BP 122/82   Pulse 88   Temp (!) 97.5 F (36.4 C)   Resp 20   Ht 5\' 8"  (1.727 m)   Wt (!) 345 lb (156.5 kg)   LMP  (LMP Unknown)   SpO2 95%   BMI 52.46 kg/m   Physical Examination:  General: Awake, alert, morbidly obese, chronically ill-appearing female, No acute distress Cardio: regular rate and rhythm, S1S2 heard, no murmurs appreciated Pulm: clear to auscultation bilaterally, no wheezes, rhonchi or rales; normal work of breathing on room air MSK: Arrives in wheelchair.  No gross joint deformities, erythema, warmth or swelling  noted in the hands. Psych: Mood is depressed.  Patient is pleasant. Depression screen [facility] 2/9 05/11/2021 11/29/2020 07/28/2020  Decreased Interest 2 2 3   Down, Depressed, Hopeless 3 2 3   PHQ - 2 Score 5 4 6   Altered sleeping 3 2 2   Tired, decreased energy 2 2 2   Change in appetite 0 2 1  Feeling bad or failure about yourself  0 2 1  Trouble concentrating 0 0 0  Moving slowly or fidgety/restless 0 0 0  Suicidal thoughts 0 0 0  PHQ-9 Score 10 12 12   Difficult doing work/chores Somewhat difficult Somewhat difficult -  Some recent data might be hidden   GAD 7 : Generalized Anxiety Score 05/11/2021 11/29/2020 06/25/2019  Nervous, Anxious, on Edge 1 2 3   Control/stop worrying 1 3 3   Worry too much - different things 1 3 3   Trouble relaxing 1 3 2   Restless 1 2 0  Easily annoyed or irritable 1 3 2   Afraid - awful might happen 0 0 1  Total GAD 7 Score 6 16 14   Anxiety Difficulty Somewhat difficult Somewhat difficult Somewhat difficult    Assessment/ Plan: 50 y.o. female   Uncontrolled type 2 diabetes mellitus with hyperglycemia (HCC) - Plan: CMP14+EGFR, Bayer DCA Hb A1c Waived, Dulaglutide (TRULICITY) 3 GQ/9.1QX SOPN, Alpha-Lipoic Acid 600 MG CAPS  Morbid obesity (HCC)  Hyperlipidemia due to type 2 diabetes mellitus ([place]) - Plan: CMP14+EGFR, LDL Cholesterol, Direct  Hypertension associated with diabetes ([place]) - Plan: CMP14+EGFR  Impaired mobility and activities of daily living  Decreased mobility and endurance  Wheelchair dependence  Arthritis - Plan: diclofenac Sodium (VOLTAREN) 1 % GEL  Moderate episode of recurrent major depressive disorder (HCC) - Plan: DULoxetine (CYMBALTA) 30 MG capsule  Sugar remains uncontrolled but she is had a fairly decent drop in A1c now down to 8.9%.  I am going to advance her Trulicity to 3 mg weekly.  Would like her to be reevaluated in  3 months for diabetes.  Hopefully will continue to see an impact on her weight which I am sure contributes to her  chronic back pain.  Continue statin.  Check LDL given nonfasting status  Blood pressure is controlled.  No changes needed  Continue to work with pain management as she sounds like she is nonoperative.  Voltaren gel provided for arthritis and hand.  I have added alpha lipoic acid in efforts to help with neuropathy.  She is already on a very large dose of Lyrica so I am not sure that escalating further would be appropriate.  I did advance her Cymbalta due to depressive symptoms to 90 mg total.  I will CC her information to her specialist as FYI.  I would like to see her back in about 6 weeks via telephone visit to see how she is doing on this advanced today.  We discussed [facility] testing as an option as well.    No orders of the defined types were placed in this encounter.  Meds ordered this encounter  Medications   Dulaglutide (TRULICITY) 3 WU/9.8JX SOPN    Sig: Inject 3 mg as directed once a week.    Dispense:  6 mL    Refill:  1   Alpha-Lipoic Acid 600 MG CAPS    Sig: Take 1 capsule (600 mg total) by mouth daily. For neuropathy    Dispense:  90 capsule    Refill:  3   diclofenac Sodium (VOLTAREN) 1 % GEL    Sig: Apply 2 g topically 3 (three) times daily as needed (arthritis).    Dispense:  200 g    Refill:  12   DULoxetine (CYMBALTA) 30 MG capsule    Sig: Take 1 capsule (30 mg total) by mouth daily. TAKE WITH $RemoveB'60mg'XCAVbmpm$  to total $Remove'90mg'Dnssnoz$  daily    Dispense:  90 capsule    Refill:  0   budesonide-formoterol (SYMBICORT) 160-4.5 MCG/ACT inhaler    Sig: Inhale 2 puffs into the lungs 2 (two) times daily.    Dispense:  10.2 g    Refill:  11   solifenacin (VESICARE) 10 MG tablet    Sig: Take 1 tablet (10 mg total) by mouth daily.    Dispense:  30 tablet    Refill:  11   loratadine (CLARITIN) 10 MG tablet    Sig: Take 1 tablet (10 mg total) by mouth daily.    Dispense:  30 tablet    Refill:  [place], [place] [phone]

## 2021-05-12 LAB — CMP14+EGFR
ALT: 48 IU/L — ABNORMAL HIGH (ref 0–32)
AST: 62 IU/L — ABNORMAL HIGH (ref 0–40)
Albumin/Globulin Ratio: 1.3 (ref 1.2–2.2)
Albumin: 3.9 g/dL (ref 3.8–4.8)
Alkaline Phosphatase: 92 IU/L (ref 44–121)
BUN/Creatinine Ratio: 10 (ref 9–23)
BUN: 8 mg/dL (ref 6–24)
Bilirubin Total: <0.2 mg/dL (ref 0.0–1.2)
CO2: 29 mmol/L (ref 20–29)
Calcium: 9.4 mg/dL (ref 8.7–10.2)
Chloride: 97 mmol/L (ref 96–106)
Creatinine, Ser: 0.77 mg/dL (ref 0.57–1.00)
Globulin, Total: 3 g/dL (ref 1.5–4.5)
Glucose: 193 mg/dL — ABNORMAL HIGH (ref 65–99)
Potassium: 4.2 mmol/L (ref 3.5–5.2)
Sodium: 138 mmol/L (ref 134–144)
Total Protein: 6.9 g/dL (ref 6.0–8.5)
eGFR: 95 mL/min/{1.73_m2} (ref >59)

## 2021-05-12 LAB — LDL CHOLESTEROL, DIRECT: LDL Direct: 116 mg/dL — ABNORMAL HIGH (ref 0–99)

## 2021-05-13 ENCOUNTER — Other Ambulatory Visit: Payer: Self-pay | Admitting: Family Medicine

## 2021-05-13 ENCOUNTER — Telehealth: Payer: Self-pay

## 2021-05-13 MED ORDER — ROSUVASTATIN CALCIUM 20 MG PO TABS: 20.0000 mg | ORAL_TABLET | Freq: Every day | ORAL | 3 refills | Status: DC

## 2021-05-13 NOTE — Telephone Encounter (Signed)
KeySilvestre Moment - PA Case ID: [id number] - Rx #: [id number] Need help? Call us at [phone]  Status   Sent to [facility]  Drug   Budesonide-Formoterol Fumarate 160-4.5MCG/ACT aerosol  Form [employer] Medicare Part D Electronic Prior Authorization Form (2017 NCPDP) Original Claim Info [phone] Provide Exception Process Printed NoticeNon-Form, Dr. Augustine Radar ePA at [email or web address].comSYMBICORT;ADVAIR HFA;BREO;FLUTICASONE/SALMETEROL (AIRDUO);DULERA NOIB'[id number]: For [employer] Coupon Price of: $244.47 submit to BIN: [id number] PCN: CP Group: [id number] --Service provided at no cost and no switch fee to the pharmacy--

## 2021-05-16 DIAGNOSIS — E1165 Type 2 diabetes mellitus with hyperglycemia: Secondary | ICD-10-CM | POA: Diagnosis not present

## 2021-05-16 DIAGNOSIS — M5442 Lumbago with sciatica, left side: Secondary | ICD-10-CM | POA: Diagnosis not present

## 2021-05-16 DIAGNOSIS — Z794 Long term (current) use of insulin: Secondary | ICD-10-CM | POA: Diagnosis not present

## 2021-05-17 NOTE — Telephone Encounter (Signed)
This medication or product is on your plan's list of covered drugs. Prior authorization is not required at this time

## 2021-05-23 ENCOUNTER — Telehealth (INDEPENDENT_AMBULATORY_CARE_PROVIDER_SITE_OTHER): Payer: Self-pay | Admitting: *Deleted

## 2021-05-23 NOTE — Telephone Encounter (Signed)
Fax from [facility] stating they have tried to reach pt multiple times to encourage pt to return sample of cologuard test. I saw note in chart from dr Jenetta Downer from April 2022. States insurance will not cover til pt is 38 which will be this December and note states pt will do then. I called [facility] [phone] and let them know that pt will do after she turns 50 when insurance will cover. They told me kit was good til April of 2023. I called pt and let her know. She verbalized understanding.

## 2021-05-30 DIAGNOSIS — G8929 Other chronic pain: Secondary | ICD-10-CM | POA: Diagnosis not present

## 2021-05-30 DIAGNOSIS — R202 Paresthesia of skin: Secondary | ICD-10-CM | POA: Diagnosis not present

## 2021-05-30 DIAGNOSIS — M79641 Pain in right hand: Secondary | ICD-10-CM | POA: Diagnosis not present

## 2021-05-30 DIAGNOSIS — M1712 Unilateral primary osteoarthritis, left knee: Secondary | ICD-10-CM | POA: Diagnosis not present

## 2021-05-30 DIAGNOSIS — M542 Cervicalgia: Secondary | ICD-10-CM | POA: Diagnosis not present

## 2021-05-30 DIAGNOSIS — M79642 Pain in left hand: Secondary | ICD-10-CM | POA: Diagnosis not present

## 2021-05-30 DIAGNOSIS — M25562 Pain in left knee: Secondary | ICD-10-CM | POA: Diagnosis not present

## 2021-05-31 DIAGNOSIS — M25562 Pain in left knee: Secondary | ICD-10-CM | POA: Diagnosis not present

## 2021-05-31 DIAGNOSIS — M79673 Pain in unspecified foot: Secondary | ICD-10-CM | POA: Diagnosis not present

## 2021-05-31 DIAGNOSIS — Z79891 Long term (current) use of opiate analgesic: Secondary | ICD-10-CM | POA: Diagnosis not present

## 2021-05-31 DIAGNOSIS — M545 Low back pain, unspecified: Secondary | ICD-10-CM | POA: Diagnosis not present

## 2021-05-31 DIAGNOSIS — G4733 Obstructive sleep apnea (adult) (pediatric): Secondary | ICD-10-CM | POA: Diagnosis not present

## 2021-05-31 DIAGNOSIS — E1140 Type 2 diabetes mellitus with diabetic neuropathy, unspecified: Secondary | ICD-10-CM | POA: Diagnosis not present

## 2021-05-31 DIAGNOSIS — M5416 Radiculopathy, lumbar region: Secondary | ICD-10-CM | POA: Diagnosis not present

## 2021-05-31 DIAGNOSIS — M542 Cervicalgia: Secondary | ICD-10-CM | POA: Diagnosis not present

## 2021-06-05 DIAGNOSIS — J449 Chronic obstructive pulmonary disease, unspecified: Secondary | ICD-10-CM | POA: Diagnosis not present

## 2021-06-06 ENCOUNTER — Other Ambulatory Visit: Payer: Self-pay | Admitting: Family Medicine

## 2021-06-06 DIAGNOSIS — I152 Hypertension secondary to endocrine disorders: Secondary | ICD-10-CM

## 2021-06-15 DIAGNOSIS — Z794 Long term (current) use of insulin: Secondary | ICD-10-CM | POA: Diagnosis not present

## 2021-06-15 DIAGNOSIS — E1165 Type 2 diabetes mellitus with hyperglycemia: Secondary | ICD-10-CM | POA: Diagnosis not present

## 2021-06-15 DIAGNOSIS — M5442 Lumbago with sciatica, left side: Secondary | ICD-10-CM | POA: Diagnosis not present

## 2021-06-22 ENCOUNTER — Ambulatory Visit: Payer: Medicare Other | Admitting: Family Medicine

## 2021-06-23 ENCOUNTER — Ambulatory Visit (INDEPENDENT_AMBULATORY_CARE_PROVIDER_SITE_OTHER): Payer: Medicare Other | Admitting: Gastroenterology

## 2021-07-05 DIAGNOSIS — J449 Chronic obstructive pulmonary disease, unspecified: Secondary | ICD-10-CM | POA: Diagnosis not present

## 2021-07-07 ENCOUNTER — Ambulatory Visit (INDEPENDENT_AMBULATORY_CARE_PROVIDER_SITE_OTHER): Payer: Medicare Other

## 2021-07-07 DIAGNOSIS — Z0000 Encounter for general adult medical examination without abnormal findings: Secondary | ICD-10-CM | POA: Diagnosis not present

## 2021-07-07 NOTE — Progress Notes (Signed)
MEDICARE ANNUAL WELLNESS VISIT  07/07/2021  Telephone Visit Disclaimer This Medicare AWV was conducted by telephone due to national recommendations for restrictions regarding the COVID-19 Pandemic (e.g. social distancing).  I verified, using two identifiers, that I am speaking with Aimee Phillips or their authorized healthcare agent. I discussed the limitations, risks, security, and privacy concerns of performing an evaluation and management service by telephone and the potential availability of an in-person appointment in the future. The patient expressed understanding and agreed to proceed.  Location of Patient: Home Location of Provider (nurse):  [facility]  Subjective:    Aimee Phillips is a 50 y.o. female patient of [name], DO who had a Medicare Annual Wellness Visit today via telephone. Aimee Phillips is Disabled and lives with their family. She has three children. She reports that she is socially active and does interact with friends/family regularly. She is minimally physically active and enjoys reading, crossword puzzles, and coloring books.  Patient Care Team: [name], DO as PCP - General (Family Medicine)  Advanced Directives 07/07/2021 10/29/2020 10/27/2020 04/02/2019 08/02/2011  Does Patient Have a Medical Advance Directive? No No No No Patient does not have advance directive  Does patient want to make changes to medical advance directive? - - No - Patient declined - -  Would patient like information on creating a medical advance directive? No - Patient declined - No - Patient declined No - Patient declined -  Pre-existing out of facility DNR order (yellow form or pink MOST form) - - - - No    Hospital Utilization Over the Past 12 Months: # of hospitalizations or ER visits: 0 # of surgeries: 0  Review of Systems    Patient reports that her overall health is unchanged compared to last year.  History obtained from chart review and the patient  Patient  Reported Readings (BP, Pulse, CBG, Weight, etc) none  Pain Assessment Pain : 0-10 Pain Score: 9  Pain Type: Chronic pain Pain Location: Back Pain Orientation: Lower Pain Descriptors / Indicators: Aching, Nagging Pain Onset: More than a month ago Pain Frequency: Constant Pain Relieving Factors: pain medication  Pain Relieving Factors: pain medication  Current Medications & Allergies (verified) Allergies as of 07/07/2021   No Known Allergies      Medication List        Accurate as of July 07, 2021  2:42 PM. If you have any questions, ask your nurse or doctor.          albuterol 108 (90 Base) MCG/ACT inhaler Commonly known as: VENTOLIN HFA Inhale 2 puffs into the lungs every 6 (six) hours as needed for wheezing or shortness of breath.   Alpha-Lipoic Acid 600 MG Caps Take 1 capsule (600 mg total) by mouth daily. For neuropathy   atorvastatin 20 MG tablet Commonly known as: LIPITOR TAKE ONE TABLET BY MOUTH DAILY AT 6 PM.   blood glucose meter kit and supplies Check BS up to four times a day   Blood Glucose Monitoring Suppl Devi Check bs up to four times a day   budesonide-formoterol 160-4.5 MCG/ACT inhaler Commonly known as: Symbicort Inhale 2 puffs into the lungs 2 (two) times daily.   cyclobenzaprine 10 MG tablet Commonly known as: FLEXERIL Take 1 tablet (10 mg total) by mouth 3 (three) times daily as needed for muscle spasms.   diclofenac Sodium 1 % Gel Commonly known as: Voltaren Apply 2 g topically 3 (three) times daily as needed (arthritis).  dicyclomine 10 MG capsule Commonly known as: Bentyl Take 1 capsule (10 mg total) by mouth every 8 (eight) hours as needed for spasms (abdominal pain).   docusate sodium 100 MG capsule Commonly known as: COLACE Take 100 mg by mouth daily.   DULoxetine 60 MG capsule Commonly known as: CYMBALTA Take 60 mg by mouth daily.   DULoxetine 30 MG capsule Commonly known as: Cymbalta Take 1 capsule (30 mg  total) by mouth daily. TAKE WITH 26m to total 935mdaily   furosemide 40 MG tablet Commonly known as: LASIX Take 1 tablet (40 mg total) by mouth 2 (two) times daily.   glucose blood test strip Commonly known as: Accu-Chek Aviva Use to check blood sugar four times daily   HumaLOG KwikPen 200 UNIT/ML KwikPen Generic drug: insulin lispro Inject 10-16 Units into the skin 3 (three) times daily before meals.   ibuprofen 800 MG tablet Commonly known as: IBU TAKE ONE TABLET BY MOUTH EVERY 8 HOURS AS NEEDED FOR MODERATE PAIN.   Lancet Devices Misc Test QID, DX E11.65, Aviva Plus   Lantus SoloStar 100 UNIT/ML Solostar Pen Generic drug: insulin glargine Inject 40 Units into the skin 2 (two) times daily.   lisinopril 5 MG tablet Commonly known as: ZESTRIL TAKE ONE TABLET BY MOUTH DAILY   loratadine 10 MG tablet Commonly known as: CLARITIN Take 1 tablet (10 mg total) by mouth daily.   NovoFine Plus Pen Needle 32G X 4 MM Misc Generic drug: Insulin Pen Needle UAD for insulin injection E11.65   omeprazole 20 MG capsule Commonly known as: PRILOSEC TAKE ONE CAPSULE BY MOUTH ONCE DAILY   oxyCODONE-acetaminophen 5-325 MG tablet Commonly known as: PERCOCET/ROXICET Take 1 tablet by mouth every 6 (six) hours as needed.   pregabalin 150 MG capsule Commonly known as: LYRICA Take 150 mg by mouth 3 (three) times daily.   rosuvastatin 20 MG tablet Commonly known as: Crestor Take 1 tablet (20 mg total) by mouth daily.   solifenacin 10 MG tablet Commonly known as: VESICARE Take 1 tablet (10 mg total) by mouth daily.   Spiriva HandiHaler 18 MCG inhalation capsule Generic drug: tiotropium INHALE ONE PUFF BY MOUTH DAILY. What changed:  how much to take how to take this when to take this reasons to take this additional instructions   sucralfate 1 g tablet Commonly known as: Carafate Take 1 tablet (1 g total) by mouth 4 (four) times daily -  with meals and at bedtime.   Transport  Chair Misc Needs lift chair for >3>388EKC Trulicity 3 MGMK/3.4JZopn Generic drug: Dulaglutide Inject 3 mg as directed once a week.        History (reviewed): Past Medical History:  Diagnosis Date   Asthma    Bipolar affect, depressed ([facility]   Cellulitis    [place]D [place] disease) [place]   Depression    Di[place] without complication [place]   PE (pulmonary embolism)    Pulmonary edema    Past Surgical History:  Procedure Laterality Date   CESAREAN SECTION     CHOLECYSTECTOMY     ESOPHAGOGASTRODUODENOSCOPY (EGD) WITH PROPOFOL N/A 10/29/2020   Procedure: ESOPHAGOGASTRODUODENOSCOPY (EGD) WITH PROPOFOL;  Surgeon: CaHarvel QualeMD;  [facility];  Service: Gastroenterology;  Laterality: N/A;  12:30   Family History  Problem Relation Age of Onset   Cancer Mother        breast   Heart failure Father  died of AMI   Cancer Sister    Diabetes Brother    Heart disease Sister    Social History   Socioeconomic History   Marital status: Married    Spouse name: Not on file   Number of children: 3   Years of education: Not on file   Highest education level: High school graduate  Occupational History   Occupation: Disabled  Tobacco Use   Smoking status: Never   Smokeless tobacco: Never  Vaping Use   Vaping Use: Never used  Substance and Sexual Activity   Alcohol use: No   Drug use: No   Sexual activity: Not Currently  Other Topics Concern   Not on file  Social History Narrative   Not on file   Social Determinants of Health   Financial Resource Strain: Not on file  Food Insecurity: Not on file  Transportation Needs: Not on file  Physical Activity: Not on file  Stress: Not on file  Social Connections: Not on file    Activities of Daily Living In your present state of health, do you have any difficulty performing the following activities: 07/07/2021 10/27/2020  Hearing? N N  Vision? N N  Difficulty  concentrating or making decisions? N N  Walking or climbing stairs? Y Y  Dressing or bathing? N N  Doing errands, shopping? N N  Preparing Food and eating ? N -  Using the Toilet? N -  In the past six months, have you accidently leaked urine? N -  Do you have problems with loss of bowel control? N -  Managing your Medications? N -  Managing your Finances? N -  Housekeeping or managing your Housekeeping? N -  Some recent data might be hidden   Patient reports back and knee pain when walking and climbing stairs.  She normally uses a cane or walker when she is outside the home.  Patient Education/ Literacy How often do you need to have someone help you when you read instructions, pamphlets, or other written materials from your doctor or pharmacy?: 1 - Never What is the last grade level you completed in school?: 12th grade  Exercise Current Exercise Habits: The patient does not participate in regular exercise at present, Exercise limited by: orthopedic condition(s)  Diet Patient reports consuming 2 meals a day and 1 snack(s) a day Patient reports that her primary diet is: Regular Patient reports that she does have regular access to food.   Depression Screen PHQ 2/9 Scores 05/11/2021 11/29/2020 07/28/2020 03/04/2020 06/25/2019 03/21/2019 03/06/2019  PHQ - 2 Score _0 PHQ- 9 Score _1 Fall Risk Fall Risk  07/07/2021 05/11/2021 11/29/2020 03/04/2020 12/03/2019  Falls in the past year? 0 0 0 0 1  Number falls in past yr: - - - - 0  Injury with Fall? - - - - 1  Comment - - - - -  Risk for fall due to : - - - - Impaired mobility  Follow up Falls evaluation completed - - - Falls prevention discussed     Objective:  Aimee Phillips seemed alert and oriented and she participated appropriately during our telephone visit.  Blood Pressure Weight BMI  BP Readings from Last 3 Encounters:  05/11/21 122/82  12/22/20 135/83  11/29/20 117/78   Wt Readings from Last  3 Encounters:  05/11/21 (!) 345 lb (156.5 kg)  12/22/20 (!) 345 lb (156.5 kg)  11/29/20 (!) 343 lb (155.6 kg)   BMI Readings from Last 1 Encounters:  05/11/21 52.46 kg/m    *Unable to obtain current vital signs, weight, and BMI due to telephone visit type  Hearing/Vision  Aimee Phillips did not seem to have difficulty with hearing/understanding during the telephone conversation Reports that she has not had a formal eye exam by an eye care professional within the past year Reports that she has not had a formal hearing evaluation within the past year *Unable to fully assess hearing and vision during telephone visit type  Cognitive Function: 6CIT Screen 07/07/2021 04/02/2019  What Year? 0 points 0 points  What month? 0 points 0 points  What time? 0 points 0 points  Count back from 20 0 points 0 points  Months in reverse 0 points 0 points  Repeat phrase 0 points 0 points  Total Score 0 0   (Normal:0-7, Significant for Dysfunction: >8)  Normal Cognitive Function Screening: Yes   Immunization & Health Maintenance Record Immunization History  Administered Date(s) Administered   Influenza Inj Mdck Quad Pf 08/31/2018   Influenza Split 08/16/2013, 08/11/2018, 06/04/2019   Influenza,inj,Quad PF,6+ Mos 07/28/2020   Influenza-Unspecified 08/31/2018   Pneumococcal Polysaccharide-23 12/03/2019   Tdap 02/14/2011    Health Maintenance  Topic Date Due   COVID-19 Vaccine (1) Never done   OPHTHALMOLOGY EXAM  Never done   COLONOSCOPY (Pts 45-70yr Insurance coverage will need to be confirmed)  Never done   PAP SMEAR-Modifier  10/22/2020   INFLUENZA VACCINE  04/11/2021   Hepatitis C Screening  11/29/2021 (Originally 09/05/1989)   Pneumococcal Vaccine 151648Years old (2 - PCV) 05/11/2022 (Originally 12/02/2020)   TETANUS/TDAP  05/11/2022 (Originally 02/13/2021)   FOOT EXAM  07/28/2021   HEMOGLOBIN A1C  11/08/2021   HIV Screening  Completed   HPV VACCINES  Aged Out       Assessment  This is a  routine wellness examination for Aimee Phillips  Health Maintenance: Due or Overdue Health Maintenance Due  Topic Date Due   COVID-19 Vaccine (1) Never done   OPHTHALMOLOGY EXAM  Never done   COLONOSCOPY (Pts 45-460yrInsurance coverage will need to be confirmed)  Never done   PAP SMEAR-Modifier  10/22/2020   INFLUENZA VACCINE  04/11/2021    Aimee Gripoes not need a referral for Community Assistance: Care Management:   no Social Work:    no Prescription Assistance:  no Nutrition/Diabetes Education:  no   Plan:  Personalized Goals  Goals Addressed             This Visit's Progress    Patient Stated       07/07/2021 AWV Goal: Fall Prevention  Over the next year, patient will decrease their risk for falls by: Using assistive devices, such as a cane or walker, as needed Identifying fall risks within their home and correcting them by: Removing throw rugs Adding handrails to stairs or ramps Removing clutter and keeping a clear pathway throughout the home Increasing light, especially at night Adding shower handles/bars Raising toilet seat Identifying potential personal risk factors for falls: Medication side effects Incontinence/urgency Vestibular dysfunction Hearing loss Musculoskeletal disorders Neurological disorders Orthostatic hypotension         Personalized Health Maintenance & Screening Recommendations  Influenza vaccine Colorectal cancer screening Glaucoma screening  Lung Cancer Screening Recommended: no (Low Dose CT Chest recommended if Age 50-80ears, 30 pack-year currently smoking OR have quit w/in past 15 years) Hepatitis C Screening recommended: no  HIV Screening recommended: no  Advanced Directives: Written information was not prepared per patient's request.  Referrals & Orders No orders of the defined types were placed in this encounter.   Follow-up Plan Follow-up with [name], DO as planned Schedule colonoscopy     I have personally reviewed and noted the following in the patient's chart:   Medical and social history Use of alcohol, tobacco or illicit drugs  Current medications and supplements Functional ability and status Nutritional status Physical activity Advanced directives List of other physicians Hospitalizations, surgeries, and ER visits in previous 12 months Vitals Screenings to include cognitive, depression, and falls Referrals and appointments  In addition, I have reviewed and discussed with Aimee Phillips certain preventive protocols, quality metrics, and best practice recommendations. A written personalized care plan for preventive services as well as general preventive health recommendations is available and can be mailed to the patient at her request.      Burnadette Pop  07/07/2021

## 2021-07-08 ENCOUNTER — Other Ambulatory Visit (INDEPENDENT_AMBULATORY_CARE_PROVIDER_SITE_OTHER): Payer: Self-pay | Admitting: Internal Medicine

## 2021-07-08 NOTE — Telephone Encounter (Signed)
Last seen by Dr.Castaneda on 12/22/20. Has appt in nov with chelsea

## 2021-07-08 NOTE — Telephone Encounter (Signed)
Will refill this for 1 month but needs to be readdressed during follow up appointment if she needs it. Please let her know about this

## 2021-07-11 NOTE — Telephone Encounter (Signed)
Called pt and she verbalized understanding

## 2021-07-13 ENCOUNTER — Telehealth: Payer: Self-pay | Admitting: Family Medicine

## 2021-07-13 NOTE — Telephone Encounter (Signed)
FAXED MED LIST TO [facility]

## 2021-07-14 ENCOUNTER — Other Ambulatory Visit: Payer: Self-pay | Admitting: Family Medicine

## 2021-07-14 DIAGNOSIS — F331 Major depressive disorder, recurrent, moderate: Secondary | ICD-10-CM

## 2021-07-16 DIAGNOSIS — E1165 Type 2 diabetes mellitus with hyperglycemia: Secondary | ICD-10-CM | POA: Diagnosis not present

## 2021-07-16 DIAGNOSIS — Z794 Long term (current) use of insulin: Secondary | ICD-10-CM | POA: Diagnosis not present

## 2021-07-16 DIAGNOSIS — M5442 Lumbago with sciatica, left side: Secondary | ICD-10-CM | POA: Diagnosis not present

## 2021-07-18 DIAGNOSIS — M1712 Unilateral primary osteoarthritis, left knee: Secondary | ICD-10-CM | POA: Diagnosis not present

## 2021-07-18 DIAGNOSIS — R200 Anesthesia of skin: Secondary | ICD-10-CM | POA: Diagnosis not present

## 2021-07-18 DIAGNOSIS — R202 Paresthesia of skin: Secondary | ICD-10-CM | POA: Diagnosis not present

## 2021-07-18 MED ORDER — DULOXETINE HCL 60 MG PO CPEP: 60.0000 mg | ORAL_CAPSULE | Freq: Every day | ORAL | 1 refills | Status: DC

## 2021-08-01 ENCOUNTER — Ambulatory Visit (INDEPENDENT_AMBULATORY_CARE_PROVIDER_SITE_OTHER): Payer: Medicare Other | Admitting: Gastroenterology

## 2021-08-02 DIAGNOSIS — G43711 Chronic migraine without aura, intractable, with status migrainosus: Secondary | ICD-10-CM | POA: Diagnosis not present

## 2021-08-02 DIAGNOSIS — G4489 Other headache syndrome: Secondary | ICD-10-CM | POA: Diagnosis not present

## 2021-08-02 DIAGNOSIS — M542 Cervicalgia: Secondary | ICD-10-CM | POA: Diagnosis not present

## 2021-08-02 DIAGNOSIS — Z79891 Long term (current) use of opiate analgesic: Secondary | ICD-10-CM | POA: Diagnosis not present

## 2021-08-02 DIAGNOSIS — M5416 Radiculopathy, lumbar region: Secondary | ICD-10-CM | POA: Diagnosis not present

## 2021-08-02 DIAGNOSIS — E1140 Type 2 diabetes mellitus with diabetic neuropathy, unspecified: Secondary | ICD-10-CM | POA: Diagnosis not present

## 2021-08-02 DIAGNOSIS — G4733 Obstructive sleep apnea (adult) (pediatric): Secondary | ICD-10-CM | POA: Diagnosis not present

## 2021-08-02 DIAGNOSIS — M25562 Pain in left knee: Secondary | ICD-10-CM | POA: Diagnosis not present

## 2021-08-02 DIAGNOSIS — M545 Low back pain, unspecified: Secondary | ICD-10-CM | POA: Diagnosis not present

## 2021-08-02 DIAGNOSIS — M79673 Pain in unspecified foot: Secondary | ICD-10-CM | POA: Diagnosis not present

## 2021-08-05 DIAGNOSIS — J449 Chronic obstructive pulmonary disease, unspecified: Secondary | ICD-10-CM | POA: Diagnosis not present

## 2021-08-15 DIAGNOSIS — M5442 Lumbago with sciatica, left side: Secondary | ICD-10-CM | POA: Diagnosis not present

## 2021-08-15 DIAGNOSIS — Z794 Long term (current) use of insulin: Secondary | ICD-10-CM | POA: Diagnosis not present

## 2021-08-15 DIAGNOSIS — E1165 Type 2 diabetes mellitus with hyperglycemia: Secondary | ICD-10-CM | POA: Diagnosis not present

## 2021-08-16 ENCOUNTER — Ambulatory Visit: Payer: Medicare Other | Admitting: Family Medicine

## 2021-08-17 ENCOUNTER — Encounter: Payer: Self-pay | Admitting: Family Medicine

## 2021-08-30 ENCOUNTER — Telehealth: Payer: Self-pay

## 2021-08-30 NOTE — Telephone Encounter (Signed)
WRONG PATIENT

## 2021-09-04 DIAGNOSIS — J449 Chronic obstructive pulmonary disease, unspecified: Secondary | ICD-10-CM | POA: Diagnosis not present

## 2021-09-13 ENCOUNTER — Telehealth: Payer: Self-pay | Admitting: Family Medicine

## 2021-09-13 DIAGNOSIS — E1165 Type 2 diabetes mellitus with hyperglycemia: Secondary | ICD-10-CM

## 2021-09-13 MED ORDER — SEMAGLUTIDE (1 MG/DOSE) 4 MG/3ML ~~LOC~~ SOPN: 1.0000 mg | PEN_INJECTOR | SUBCUTANEOUS | 3 refills | Status: DC

## 2021-09-13 NOTE — Telephone Encounter (Signed)
Would recommend her pharmacy try and call around for it before we deescalate her therapy.  Will also cc to Raynelle Fanning to see if she has any ideas.

## 2021-09-13 NOTE — Telephone Encounter (Signed)
Pt aware of recommendations

## 2021-09-13 NOTE — Telephone Encounter (Signed)
Pt made aware in prior telephone note

## 2021-09-13 NOTE — Telephone Encounter (Signed)
Call returned to pharmacy and patient   Trulicity 3 and 4.5mg  on backorder  Patient okay with switching to ozempic for short term.  Will push dose to 2mg  as tolerated next month.  Start at [employer] 1mg  weekly Patient counseled

## 2021-09-13 NOTE — Addendum Note (Signed)
Addended by: Lottie Dawson D on: 09/13/2021 02:20 PM   Modules accepted: Orders

## 2021-09-14 ENCOUNTER — Telehealth: Payer: Self-pay | Admitting: Family Medicine

## 2021-09-14 NOTE — Telephone Encounter (Signed)
(  Key: [id number]) Rx #: [id number] Ozempic (1 MG/DOSE) 4MG /3ML pen-injectors  PA in process

## 2021-09-14 NOTE — Telephone Encounter (Signed)
This medication or product is on your plan's list of covered drugs. Prior authorization is not required at this time. If your pharmacy has questions regarding the processing of your prescription, please have them call the [facility] pharmacy help desk at [phone]. If the treating physician would like to discuss this coverage decision with the physician or health care professional reviewer, please call [facility] Prior Authorization department at [phone].  Pharmacy aware 

## 2021-09-15 DIAGNOSIS — Z794 Long term (current) use of insulin: Secondary | ICD-10-CM | POA: Diagnosis not present

## 2021-09-15 DIAGNOSIS — E1165 Type 2 diabetes mellitus with hyperglycemia: Secondary | ICD-10-CM | POA: Diagnosis not present

## 2021-09-15 DIAGNOSIS — M5442 Lumbago with sciatica, left side: Secondary | ICD-10-CM | POA: Diagnosis not present

## 2021-09-23 DIAGNOSIS — R1084 Generalized abdominal pain: Secondary | ICD-10-CM | POA: Diagnosis not present

## 2021-09-27 ENCOUNTER — Other Ambulatory Visit: Payer: Self-pay | Admitting: Family Medicine

## 2021-09-27 ENCOUNTER — Other Ambulatory Visit (INDEPENDENT_AMBULATORY_CARE_PROVIDER_SITE_OTHER): Payer: Self-pay | Admitting: Gastroenterology

## 2021-09-27 DIAGNOSIS — G8929 Other chronic pain: Secondary | ICD-10-CM

## 2021-09-27 NOTE — Telephone Encounter (Signed)
Last seen 12/31/2020 by Dr.Castaneda.

## 2021-09-29 ENCOUNTER — Telehealth: Payer: Self-pay | Admitting: Family Medicine

## 2021-09-29 NOTE — Telephone Encounter (Signed)
Pt told the company to fax forms here for power chair. I explained to her that this was a 30 minute appt called face to face and was very detailed. I told her when received the form we would call her to set up appt

## 2021-09-29 NOTE — Telephone Encounter (Signed)
Pt is in need of new power chair. She already has one but it is a few years old. Would like to discuss with Dr Aimee Phillips at next appt. Is this okay or would pt need another appt?

## 2021-10-03 DIAGNOSIS — G4733 Obstructive sleep apnea (adult) (pediatric): Secondary | ICD-10-CM | POA: Diagnosis not present

## 2021-10-03 DIAGNOSIS — E1140 Type 2 diabetes mellitus with diabetic neuropathy, unspecified: Secondary | ICD-10-CM | POA: Diagnosis not present

## 2021-10-03 DIAGNOSIS — G43711 Chronic migraine without aura, intractable, with status migrainosus: Secondary | ICD-10-CM | POA: Diagnosis not present

## 2021-10-03 DIAGNOSIS — Z79891 Long term (current) use of opiate analgesic: Secondary | ICD-10-CM | POA: Diagnosis not present

## 2021-10-03 DIAGNOSIS — M5416 Radiculopathy, lumbar region: Secondary | ICD-10-CM | POA: Diagnosis not present

## 2021-10-03 DIAGNOSIS — G5603 Carpal tunnel syndrome, bilateral upper limbs: Secondary | ICD-10-CM | POA: Diagnosis not present

## 2021-10-03 DIAGNOSIS — M545 Low back pain, unspecified: Secondary | ICD-10-CM | POA: Diagnosis not present

## 2021-10-04 DIAGNOSIS — Z780 Asymptomatic menopausal state: Secondary | ICD-10-CM | POA: Diagnosis not present

## 2021-10-04 DIAGNOSIS — R1084 Generalized abdominal pain: Secondary | ICD-10-CM | POA: Diagnosis not present

## 2021-10-05 DIAGNOSIS — J449 Chronic obstructive pulmonary disease, unspecified: Secondary | ICD-10-CM | POA: Diagnosis not present

## 2021-10-06 NOTE — Telephone Encounter (Signed)
Closing encounter, I personally have not seen any forms. Pt has appt w/ Dr. Nadine Counts on 10/07/21

## 2021-10-07 ENCOUNTER — Encounter: Payer: Self-pay | Admitting: Family Medicine

## 2021-10-07 ENCOUNTER — Ambulatory Visit (INDEPENDENT_AMBULATORY_CARE_PROVIDER_SITE_OTHER): Payer: Medicare Other | Admitting: Family Medicine

## 2021-10-07 VITALS — BP 131/79 | HR 100 | Temp 97.6°F | Ht 68.0 in | Wt 332.0 lb

## 2021-10-07 DIAGNOSIS — Z993 Dependence on wheelchair: Secondary | ICD-10-CM

## 2021-10-07 DIAGNOSIS — I152 Hypertension secondary to endocrine disorders: Secondary | ICD-10-CM | POA: Diagnosis not present

## 2021-10-07 DIAGNOSIS — Z23 Encounter for immunization: Secondary | ICD-10-CM

## 2021-10-07 DIAGNOSIS — E1169 Type 2 diabetes mellitus with other specified complication: Secondary | ICD-10-CM | POA: Diagnosis not present

## 2021-10-07 DIAGNOSIS — E1165 Type 2 diabetes mellitus with hyperglycemia: Secondary | ICD-10-CM

## 2021-10-07 DIAGNOSIS — E1159 Type 2 diabetes mellitus with other circulatory complications: Secondary | ICD-10-CM

## 2021-10-07 DIAGNOSIS — H9201 Otalgia, right ear: Secondary | ICD-10-CM | POA: Diagnosis not present

## 2021-10-07 DIAGNOSIS — Z789 Other specified health status: Secondary | ICD-10-CM | POA: Diagnosis not present

## 2021-10-07 DIAGNOSIS — Z7409 Other reduced mobility: Secondary | ICD-10-CM | POA: Diagnosis not present

## 2021-10-07 DIAGNOSIS — E785 Hyperlipidemia, unspecified: Secondary | ICD-10-CM | POA: Diagnosis not present

## 2021-10-07 LAB — BAYER DCA HB A1C WAIVED: HB A1C (BAYER DCA - WAIVED): 7.9 % — ABNORMAL HIGH (ref 4.8–5.6)

## 2021-10-07 MED ORDER — LEVOCETIRIZINE DIHYDROCHLORIDE 5 MG PO TABS: 5.0000 mg | ORAL_TABLET | Freq: Every evening | ORAL | 3 refills | Status: DC

## 2021-10-07 NOTE — Patient Instructions (Signed)
Neuro rehab will call you for wheelchair assessment

## 2021-10-07 NOTE — Progress Notes (Signed)
Subjective: CC: Wheelchair evaluation PCP: [name], DO XTA:[id number] Aimee Phillips is a 51 y.o. female presenting to clinic today for:  1.  Impaired mobility secondary to chronic back pain, morbid obesity and hypoxia Patient is here for evaluation for new motorized wheelchair.  Apparently we were supposed to receive some type of forms for her from her wheelchair company.  She notes that the battery has given out on her current wheelchair and she was told that she was just due for another one anyways.  However, no one will come out to evaluate her current wheelchair.  2.  Type 2 diabetes associate with hypertension and hyperlipidemia Patient is compliant with all medications.  She was switched from [facility] over to [facility] and seems to be doing really well with that.  She denies any nausea, vomiting from the medication switch.  No chest pain or shortness of breath reported  3.  Popping of the ear Patient reports her right ear has been popping and crackling over the last few weeks.  Denies any drainage, dizziness.  She has a history of bilateral tympanostomy tubes that were removed several years ago.  She is not currently taking any antihistamine because her insurance does not pay for it and she cannot afford it.   ROS: Per HPI  No Known Allergies Past Medical History:  Diagnosis Date   Asthma    Bipolar affect, depressed ([place])    Cellulitis    COPD (chronic obstructive pulmonary disease) (HCC)    Depression    Diabetes mellitus without complication (HCC)    PE (pulmonary embolism)    Pulmonary edema     Current Outpatient Medications:    albuterol (PROVENTIL HFA;VENTOLIN HFA) 108 (90 Base) MCG/ACT inhaler, Inhale 2 puffs into the lungs every 6 (six) hours as needed for wheezing or shortness of breath., Disp: 1 Inhaler, Rfl: 0   Alpha-Lipoic Acid 600 MG CAPS, Take 1 capsule (600 mg total) by mouth daily. For neuropathy, Disp: 90 capsule, Rfl: 3   blood glucose meter kit and  supplies, Check BS up to four times a day, Disp: 1 each, Rfl: 0   Blood Glucose Monitoring Suppl DEVI, Check bs up to four times a day, Disp: 1 each, Rfl: 0   budesonide-formoterol (SYMBICORT) 160-4.5 MCG/ACT inhaler, Inhale 2 puffs into the lungs 2 (two) times daily., Disp: 10.2 g, Rfl: 11   cyclobenzaprine (FLEXERIL) 10 MG tablet, Take 1 tablet (10 mg total) by mouth 3 (three) times daily as needed for muscle spasms., Disp: 90 tablet, Rfl: 5   diclofenac Sodium (VOLTAREN) 1 % GEL, Apply 2 g topically 3 (three) times daily as needed (arthritis)., Disp: 200 g, Rfl: 12   dicyclomine (BENTYL) 10 MG capsule, TAKE ONE CAPSULE BY MOUTH EVERY 8 HOURS AS NEEDED SPASMS (FOR ABDOMINAL PAIN), Disp: 60 capsule, Rfl: 5   docusate sodium (COLACE) 100 MG capsule, Take 100 mg by mouth daily., Disp: , Rfl:    DULoxetine (CYMBALTA) 30 MG capsule, TAKE ONE CAPSULE BY MOUTH ONCE DAILY WITH 60MG CAPSULES., Disp: 90 capsule, Rfl: 0   DULoxetine (CYMBALTA) 60 MG capsule, Take 1 capsule (60 mg total) by mouth daily., Disp: 90 capsule, Rfl: 1   furosemide (LASIX) 40 MG tablet, Take 1 tablet (40 mg total) by mouth 2 (two) times daily., Disp: 60 tablet, Rfl: 11   gabapentin (NEURONTIN) 800 MG tablet, Take 800 mg by mouth 3 (three) times daily., Disp: , Rfl:    glucose blood (ACCU-CHEK AVIVA) test strip,  Use to check blood sugar four times daily, Disp: 200 each, Rfl: 12   ibuprofen (IBU) 800 MG tablet, TAKE ONE TABLET BY MOUTH EVERY 8 HOURS AS NEEDED FOR MODERATE PAIN., Disp: 90 tablet, Rfl: 0   insulin glargine (LANTUS SOLOSTAR) 100 UNIT/ML Solostar Pen, Inject 40 Units into the skin 2 (two) times daily., Disp: 70 mL, Rfl: 3   insulin lispro (HUMALOG KWIKPEN) 200 UNIT/ML KwikPen, Inject 10-16 Units into the skin 3 (three) times daily before meals., Disp: 15 mL, Rfl: prn   Insulin Pen Needle (NOVOFINE PLUS PEN NEEDLE) 32G X 4 MM MISC, UAD for insulin injection E11.65, Disp: 500 each, Rfl: 3   Lancet Devices MISC, Test QID, DX  E11.65, Aviva Plus, Disp: 100 each, Rfl: prn   lisinopril (ZESTRIL) 5 MG tablet, TAKE ONE TABLET BY MOUTH DAILY, Disp: 90 tablet, Rfl: 0   loratadine (CLARITIN) 10 MG tablet, Take 1 tablet (10 mg total) by mouth daily., Disp: 30 tablet, Rfl: 11   Misc. Devices (TRANSPORT CHAIR) MISC, Needs lift chair for >300lbs, Disp: 1 each, Rfl: 0   omeprazole (PRILOSEC) 20 MG capsule, TAKE ONE CAPSULE BY MOUTH ONCE DAILY, Disp: 90 capsule, Rfl: 0   oxyCODONE-acetaminophen (PERCOCET/ROXICET) 5-325 MG tablet, Take 1 tablet by mouth every 6 (six) hours as needed., Disp: , Rfl:    rosuvastatin (CRESTOR) 20 MG tablet, Take 1 tablet (20 mg total) by mouth daily., Disp: 90 tablet, Rfl: 3   Semaglutide, 1 MG/DOSE, 4 MG/3ML SOPN, Inject 1 mg as directed once a week., Disp: 3 mL, Rfl: 3   solifenacin (VESICARE) 10 MG tablet, Take 1 tablet (10 mg total) by mouth daily., Disp: 30 tablet, Rfl: 11   SPIRIVA HANDIHALER 18 MCG inhalation capsule, INHALE ONE PUFF BY MOUTH DAILY. (Patient taking differently: Place 18 mcg into inhaler and inhale daily as needed (asthma).), Disp: 30 capsule, Rfl: 12   sucralfate (CARAFATE) 1 g tablet, TAKE ONE TABLET BY MOUTH FOUR TIMES DAILY WITH MEALS AND AT BEDTIME, Disp: 120 tablet, Rfl: 0   topiramate (TOPAMAX) 100 MG tablet, Take 100 mg by mouth 2 (two) times daily., Disp: , Rfl:  Social History   Socioeconomic History   Marital status: Married    Spouse name: Not on file   Number of children: 3   Years of education: Not on file   Highest education level: High school graduate  Occupational History   Occupation: Disabled  Tobacco Use   Smoking status: Never   Smokeless tobacco: Never  Vaping Use   Vaping Use: Never used  Substance and Sexual Activity   Alcohol use: No   Drug use: No   Sexual activity: Not Currently  Other Topics Concern   Not on file  Social History Narrative   Not on file   Social Determinants of Health   Financial Resource Strain: Not on file  Food  Insecurity: Not on file  Transportation Needs: Not on file  Physical Activity: Not on file  Stress: Not on file  Social Connections: Not on file  Intimate Partner Violence: Not on file   Family History  Problem Relation Age of Onset   Cancer Mother        breast   Heart failure Father        died of AMI   Cancer Sister    Diabetes Brother    Heart disease Sister     Objective: Office vital signs reviewed. BP 131/79    Pulse 100    Temp  97.6 F (36.4 C)    Ht _0  (1.727 m)    Wt (!) 332 lb (150.6 kg)    LMP  (LMP Unknown)    SpO2 95%    BMI 50.48 kg/m   Physical Examination:  General: Awake, alert, super morbidly obese, No acute distress HEENT: Right TM with dulled light reflex, scarring.  Left TM similar but somewhat clearer than the right. Cardio: regular rate and rhythm, S1S2 heard, no murmurs appreciated Pulm: clear to auscultation bilaterally, no wheezes, rhonchi or rales; normal work of breathing on room air MSK: Arrives in wheelchair today.  Patient moves all extremities independently  Assessment/ Plan: 51 y.o. female   Uncontrolled type 2 diabetes mellitus with hyperglycemia ([place]) - Plan: Bayer DCA Hb A1c Waived  Hyperlipidemia due to type 2 diabetes mellitus ([place])  Hypertension associated with diabetes ([place])  Impaired mobility and activities of daily living - Plan: Ambulatory referral to Physical Therapy  Decreased mobility and endurance - Plan: Ambulatory referral to Physical Therapy  Wheelchair dependence - Plan: Ambulatory referral to Physical Therapy  Morbid obesity ([place]) - Plan: Ambulatory referral to Physical Therapy  Need for immunization against influenza - Plan: Flu Vaccine QUAD 66moIM (Fluarix, Fluzone & Alfiuria Quad PF)  Right ear pain - Plan: levocetirizine (XYZAL) 5 MG tablet  Diabetes remains uncontrolled but is getting better.  I am optimistic about how she is going to respond to ongoing treatment with Ozempic.  Plan to see each other  again in the next 3 to 4 months for recheck  Not yet due for fasting lipid.  Continue statin  Blood pressures controlled.  Continue current regimen  Patient with impaired mobility and activities of daily living secondary to chronic back pain, chronic knee pain.  She is going to be getting some type of surgery soon but she has to lose 100 pounds before she can attain this.  She will certainly need ongoing assistance with wheelchair and for this reason I have placed a referral to neuro rehab so that she may be evaluated, sized, etc. for this.  For her right ear pain, I suspect this is secondary to allergy and possibly some eustachian tube dysfunction.  I prescribed her Xyzal for nightly use for allergies.  No evidence of secondary bacterial infection on exam today  Influenza vaccination administered  Orders Placed This Encounter  Procedures   Flu Vaccine QUAD 653moM (Fluarix, Fluzone & Alfiuria Quad PF)   Bayer DCA Hb A1c Waived   No orders of the defined types were placed in this encounter.    AsJanora NorlanderDO [phone]

## 2021-10-13 DIAGNOSIS — R1084 Generalized abdominal pain: Secondary | ICD-10-CM | POA: Diagnosis not present

## 2021-10-16 DIAGNOSIS — M5442 Lumbago with sciatica, left side: Secondary | ICD-10-CM | POA: Diagnosis not present

## 2021-10-16 DIAGNOSIS — Z794 Long term (current) use of insulin: Secondary | ICD-10-CM | POA: Diagnosis not present

## 2021-10-16 DIAGNOSIS — E1165 Type 2 diabetes mellitus with hyperglycemia: Secondary | ICD-10-CM | POA: Diagnosis not present

## 2021-10-18 ENCOUNTER — Telehealth: Payer: Self-pay | Admitting: Family Medicine

## 2021-10-18 NOTE — Telephone Encounter (Signed)
REFERRAL REQUEST Telephone Note  Have you been seen at our office for this problem? Pt said she talked with dr Darnell Level about having her teeth pulled (Advise that they may need an appointment with their PCP before a referral can be done)  Reason for Referral: oral surgeon Referral discussed with patient: pt said yes  Best contact number of patient for referral team: [phone]    Has patient been seen by a specialist for this issue before: yes  Patient provider preference for referral: Nicki Reaper Jenson/oral surgeon  Patient location preference for referral: [address] [place]   Patient notified that referrals can take up to a week or longer to process. If they haven't heard anything within a week they should call back and speak with the referral department.

## 2021-10-19 NOTE — Telephone Encounter (Signed)
This would need to come from her dentist.

## 2021-10-19 NOTE — Telephone Encounter (Signed)
Pt son aware. 

## 2021-10-24 ENCOUNTER — Telehealth (INDEPENDENT_AMBULATORY_CARE_PROVIDER_SITE_OTHER): Payer: Medicare Other | Admitting: Gastroenterology

## 2021-10-24 ENCOUNTER — Other Ambulatory Visit: Payer: Self-pay

## 2021-11-01 ENCOUNTER — Ambulatory Visit: Payer: Medicare Other | Attending: Family Medicine

## 2021-11-05 DIAGNOSIS — J449 Chronic obstructive pulmonary disease, unspecified: Secondary | ICD-10-CM | POA: Diagnosis not present

## 2021-11-28 ENCOUNTER — Telehealth: Payer: Self-pay | Admitting: Family Medicine

## 2021-11-28 DIAGNOSIS — F331 Major depressive disorder, recurrent, moderate: Secondary | ICD-10-CM

## 2021-11-28 MED ORDER — DULOXETINE HCL 30 MG PO CPEP: ORAL_CAPSULE | ORAL | 0 refills | Status: DC

## 2021-11-28 NOTE — Telephone Encounter (Signed)
She should be taking 1 of the 30mg  PLUS 1 of the 60mg  capsules to equal 90mg  daily.  Is she doing this? ?

## 2021-11-28 NOTE — Telephone Encounter (Signed)
Pt called - she states that the pharm did not fill her 30 mg once they got the 60mg  - I have made her aware of what she should be taking and sent a new RX of the 30 mg over to pharm with a note that she will be taking it with the 60 mg cap.  ? ?

## 2021-12-03 DIAGNOSIS — J449 Chronic obstructive pulmonary disease, unspecified: Secondary | ICD-10-CM | POA: Diagnosis not present

## 2021-12-05 ENCOUNTER — Other Ambulatory Visit: Payer: Self-pay | Admitting: Family Medicine

## 2021-12-05 ENCOUNTER — Encounter: Payer: Self-pay | Admitting: Family Medicine

## 2021-12-05 ENCOUNTER — Ambulatory Visit (INDEPENDENT_AMBULATORY_CARE_PROVIDER_SITE_OTHER): Payer: Medicare Other | Admitting: Family Medicine

## 2021-12-05 VITALS — BP 119/75 | HR 96 | Temp 98.4°F | Ht 68.0 in | Wt 329.0 lb

## 2021-12-05 DIAGNOSIS — Z993 Dependence on wheelchair: Secondary | ICD-10-CM

## 2021-12-05 DIAGNOSIS — F32A Depression, unspecified: Secondary | ICD-10-CM | POA: Diagnosis not present

## 2021-12-05 DIAGNOSIS — Z7409 Other reduced mobility: Secondary | ICD-10-CM

## 2021-12-05 DIAGNOSIS — E1165 Type 2 diabetes mellitus with hyperglycemia: Secondary | ICD-10-CM

## 2021-12-05 MED ORDER — BUPROPION HCL ER (XL) 150 MG PO TB24: 150.0000 mg | ORAL_TABLET | Freq: Every day | ORAL | 1 refills | Status: DC

## 2021-12-05 NOTE — Progress Notes (Signed)
? ?Subjective: ?CC: Mobility evaluation ?PCP: [name], DO ?[id number] D Stonesifer is a 51 y.o. female presenting to clinic today for: ? ?1.  Mobility evaluation ?Patient reports that she never received the 3 voicemails that were left for her neurology, physical therapy eval for wheelchair fitting and prescription.  She apparently no-show to the appointment that was made for her in February and is here again to discuss wheelchair/Hoveround prescription.  She reports difficulty with mobility secondary to chronic leg, foot and back pain.  She can walk with the use of a walker but notes that this is extremely limited and that she has to stop after only a few minutes to take a break.  She has balance issues and worries about falls.  She has considered a transport wheelchair but her husband suffers from cardiac issues and is not able to put her and she is not able to self propel due to her body habitus.  She easily fatigues. ? ?2.  Depression ?Patient reports uncontrolled depression despite 90 mg of Cymbalta daily.  She has a friend who uses Klonopin and she wonders if this is a good option for her.  She has tried tricyclic antidepressants in the past but this was not helpful.  She has been on Wellbutrin in the past but was never in addition to any other medications.  She would be willing to revisit that. ? ? ?ROS: Per HPI ? ?No Known Allergies ?Past Medical History:  ?Diagnosis Date  ? Asthma   ? Bipolar affect, depressed ([place])   ? Cellulitis   ? COPD (chronic obstructive pulmonary disease) ([place])   ? Depression   ? Diabetes mellitus without complication ([place])   ? PE (pulmonary embolism)   ? Pulmonary edema   ? ? ?Current Outpatient Medications:  ?  albuterol (PROVENTIL HFA;VENTOLIN HFA) 108 (90 Base) MCG/ACT inhaler, Inhale 2 puffs into the lungs every 6 (six) hours as needed for wheezing or shortness of breath., Disp: 1 Inhaler, Rfl: 0 ?  Alpha-Lipoic Acid 600 MG CAPS, Take 1 capsule (600 mg total) by mouth  daily. For neuropathy, Disp: 90 capsule, Rfl: 3 ?  blood glucose meter kit and supplies, Check BS up to four times a day, Disp: 1 each, Rfl: 0 ?  Blood Glucose Monitoring Suppl DEVI, Check bs up to four times a day, Disp: 1 each, Rfl: 0 ?  budesonide-formoterol (SYMBICORT) 160-4.5 MCG/ACT inhaler, Inhale 2 puffs into the lungs 2 (two) times daily., Disp: 10.2 g, Rfl: 11 ?  cyclobenzaprine (FLEXERIL) 10 MG tablet, Take 1 tablet (10 mg total) by mouth 3 (three) times daily as needed for muscle spasms., Disp: 90 tablet, Rfl: 5 ?  diclofenac Sodium (VOLTAREN) 1 % GEL, Apply 2 g topically 3 (three) times daily as needed (arthritis)., Disp: 200 g, Rfl: 12 ?  dicyclomine (BENTYL) 10 MG capsule, TAKE ONE CAPSULE BY MOUTH EVERY 8 HOURS AS NEEDED SPASMS (FOR ABDOMINAL PAIN), Disp: 60 capsule, Rfl: 5 ?  docusate sodium (COLACE) 100 MG capsule, Take 100 mg by mouth daily., Disp: , Rfl:  ?  DULoxetine (CYMBALTA) 30 MG capsule, TAKE ONE CAPSULE BY MOUTH ONCE DAILY WITH 60MG CAPSULES., Disp: 90 capsule, Rfl: 0 ?  DULoxetine (CYMBALTA) 60 MG capsule, Take 1 capsule (60 mg total) by mouth daily., Disp: 90 capsule, Rfl: 1 ?  furosemide (LASIX) 40 MG tablet, Take 1 tablet (40 mg total) by mouth 2 (two) times daily., Disp: 60 tablet, Rfl: 11 ?  gabapentin (NEURONTIN) 800 MG tablet,  Take 800 mg by mouth 3 (three) times daily., Disp: , Rfl:  ?  glucose blood (ACCU-CHEK AVIVA) test strip, Use to check blood sugar four times daily, Disp: 200 each, Rfl: 12 ?  ibuprofen (IBU) 800 MG tablet, TAKE ONE TABLET BY MOUTH EVERY 8 HOURS AS NEEDED FOR MODERATE PAIN., Disp: 90 tablet, Rfl: 0 ?  insulin glargine (LANTUS SOLOSTAR) 100 UNIT/ML Solostar Pen, Inject 40 Units into the skin 2 (two) times daily., Disp: 70 mL, Rfl: 3 ?  insulin lispro (HUMALOG KWIKPEN) 200 UNIT/ML KwikPen, Inject 10-16 Units into the skin 3 (three) times daily before meals., Disp: 15 mL, Rfl: prn ?  Insulin Pen Needle (NOVOFINE PLUS PEN NEEDLE) 32G X 4 MM MISC, UAD for insulin  injection E11.65, Disp: 500 each, Rfl: 3 ?  Lancet Devices MISC, Test QID, DX E11.65, Aviva Plus, Disp: 100 each, Rfl: prn ?  levocetirizine (XYZAL) 5 MG tablet, Take 1 tablet (5 mg total) by mouth every evening. For allergies, Disp: 90 tablet, Rfl: 3 ?  lisinopril (ZESTRIL) 5 MG tablet, TAKE ONE TABLET BY MOUTH DAILY, Disp: 90 tablet, Rfl: 0 ?  Misc. Devices (TRANSPORT CHAIR) MISC, Needs lift chair for >300lbs, Disp: 1 each, Rfl: 0 ?  omeprazole (PRILOSEC) 20 MG capsule, TAKE ONE CAPSULE BY MOUTH ONCE DAILY, Disp: 90 capsule, Rfl: 0 ?  oxyCODONE-acetaminophen (PERCOCET/ROXICET) 5-325 MG tablet, Take 1 tablet by mouth every 6 (six) hours as needed., Disp: , Rfl:  ?  rosuvastatin (CRESTOR) 20 MG tablet, Take 1 tablet (20 mg total) by mouth daily., Disp: 90 tablet, Rfl: 3 ?  Semaglutide, 1 MG/DOSE, 4 MG/3ML SOPN, Inject 1 mg as directed once a week., Disp: 3 mL, Rfl: 3 ?  solifenacin (VESICARE) 10 MG tablet, Take 1 tablet (10 mg total) by mouth daily., Disp: 30 tablet, Rfl: 11 ?  SPIRIVA HANDIHALER 18 MCG inhalation capsule, INHALE ONE PUFF BY MOUTH DAILY. (Patient taking differently: Place 18 mcg into inhaler and inhale daily as needed (asthma).), Disp: 30 capsule, Rfl: 12 ?  sucralfate (CARAFATE) 1 g tablet, TAKE ONE TABLET BY MOUTH FOUR TIMES DAILY WITH MEALS AND AT BEDTIME, Disp: 120 tablet, Rfl: 0 ?  topiramate (TOPAMAX) 100 MG tablet, Take 100 mg by mouth 2 (two) times daily., Disp: , Rfl:  ?Social History  ? ?Socioeconomic History  ? Marital status: Married  ?  Spouse name: Not on file  ? Number of children: 3  ? Years of education: Not on file  ? Highest education level: High school graduate  ?Occupational History  ? Occupation: Disabled  ?Tobacco Use  ? Smoking status: Never  ? Smokeless tobacco: Never  ?Vaping Use  ? Vaping Use: Never used  ?Substance and Sexual Activity  ? Alcohol use: No  ? Drug use: No  ? Sexual activity: Not Currently  ?Other Topics Concern  ? Not on file  ?Social History Narrative  ?  Not on file  ? ?Social Determinants of Health  ? ?Financial Resource Strain: Not on file  ?Food Insecurity: Not on file  ?Transportation Needs: Not on file  ?Physical Activity: Not on file  ?Stress: Not on file  ?Social Connections: Not on file  ?Intimate Partner Violence: Not on file  ? ?Family History  ?Problem Relation Age of Onset  ? Cancer Mother   ?     breast  ? Heart failure Father   ?     died of AMI  ? Cancer Sister   ? Diabetes Brother   ?  Heart disease Sister   ? ? ?Objective: ?Office vital signs reviewed. ?BP 119/75   Pulse 96   Temp 98.4 ?F (36.9 ?C)   Ht _0  (1.727 m)   Wt (!) 329 lb (149.2 kg)   LMP  (LMP Unknown)   SpO2 95%   BMI 50.02 kg/m?  ? ?Physical Examination:  ?General: Awake, alert, super morbidly obese, No acute distress ?HEENT: Sclera white ?Cardio: regular rate and rhythm  ?Pulm:  normal work of breathing on room air ?MSK: Arrives in wheelchair.  Had difficulty rising from seated position. 4/5 lower extremity strength.  Light touch sensation grossly intact.  Able to move all extremities independently while seated.  Range of motion seems to be fairly preserved.  She is able to shift her own weight and able to self transfer.  She has full active range of motion of the upper extremities and strength is intact. ?Skin: No pressure sores appreciated ?Extremities: Mild to moderate nonpitting edema noted to the lower extremities ?Psych: Mood stable, speech normal, affect appropriate ? ?  12/05/2021  ?  2:01 PM 10/07/2021  ?  2:51 PM 07/07/2021  ?  2:42 PM  ?Depression screen PHQ 2/9  ?Decreased Interest 0 1 0  ?Down, Depressed, Hopeless 0 1 1  ?PHQ - 2 Score 0 2 1  ?Altered sleeping 0 1   ?Tired, decreased energy 0 1   ?Change in appetite 0 2   ?Feeling bad or failure about yourself  1 0   ?Trouble concentrating 1 1   ?Moving slowly or fidgety/restless 0 0   ?Suicidal thoughts 0 0   ?PHQ-9 Score 2 7   ?Difficult doing work/chores Somewhat difficult Somewhat difficult   ? ? ?  12/05/2021  ?   2:01 PM 10/07/2021  ?  2:52 PM 05/11/2021  ?  3:47 PM 11/29/2020  ? 11:17 AM  ?GAD 7 : Generalized Anxiety Score  ?Nervous, Anxious, on Edge _1 ?Control/stop worrying 0 _2 ?Worry too much - different t

## 2021-12-05 NOTE — Patient Instructions (Addendum)
[phone]  ? ?Wellbutrin added ?We talked about Vraylar if this doesn't work vs going to see Dr Evelene Croon ?You can schedule an appt with Deliah Boston for genetic testing ?

## 2021-12-06 ENCOUNTER — Telehealth: Payer: Self-pay | Admitting: Family Medicine

## 2021-12-06 MED ORDER — OZEMPIC (1 MG/DOSE) 4 MG/3ML ~~LOC~~ SOPN: PEN_INJECTOR | SUBCUTANEOUS | 2 refills | Status: DC

## 2021-12-06 MED ORDER — BUPROPION HCL ER (XL) 150 MG PO TB24: 150.0000 mg | ORAL_TABLET | Freq: Every day | ORAL | 1 refills | Status: DC

## 2021-12-06 NOTE — Progress Notes (Signed)
Refill failed. resent °

## 2021-12-06 NOTE — Telephone Encounter (Signed)
Refill failed. resent °

## 2021-12-06 NOTE — Telephone Encounter (Signed)
Pt states she got it for 16$, wants to try [facility] next time ?

## 2021-12-06 NOTE — Telephone Encounter (Signed)
Patient said that she needed a referral sent to [facility] in [place] for a power wheel chair. Please call back with any questions.  ?

## 2021-12-06 NOTE — Telephone Encounter (Signed)
Please call ins company and check on this.  That doesn't make sense.  It's generic and is on the $4 list at [facility].  Was it filled at another pharmacy? ?

## 2021-12-06 NOTE — Telephone Encounter (Signed)
done

## 2021-12-06 NOTE — Addendum Note (Signed)
Addended by: Julious Payer D on: 12/06/2021 08:14 AM ? ? Modules accepted: Orders ? ?

## 2021-12-13 ENCOUNTER — Ambulatory Visit: Payer: Medicare Other | Admitting: Family Medicine

## 2021-12-13 ENCOUNTER — Encounter: Payer: Self-pay | Admitting: Family Medicine

## 2021-12-27 DIAGNOSIS — M545 Low back pain, unspecified: Secondary | ICD-10-CM | POA: Diagnosis not present

## 2021-12-27 DIAGNOSIS — G5603 Carpal tunnel syndrome, bilateral upper limbs: Secondary | ICD-10-CM | POA: Diagnosis not present

## 2021-12-27 DIAGNOSIS — G4733 Obstructive sleep apnea (adult) (pediatric): Secondary | ICD-10-CM | POA: Diagnosis not present

## 2021-12-27 DIAGNOSIS — Z79891 Long term (current) use of opiate analgesic: Secondary | ICD-10-CM | POA: Diagnosis not present

## 2021-12-27 DIAGNOSIS — M79673 Pain in unspecified foot: Secondary | ICD-10-CM | POA: Diagnosis not present

## 2021-12-27 DIAGNOSIS — M25562 Pain in left knee: Secondary | ICD-10-CM | POA: Diagnosis not present

## 2021-12-27 DIAGNOSIS — E1140 Type 2 diabetes mellitus with diabetic neuropathy, unspecified: Secondary | ICD-10-CM | POA: Diagnosis not present

## 2021-12-27 DIAGNOSIS — M542 Cervicalgia: Secondary | ICD-10-CM | POA: Diagnosis not present

## 2022-01-02 ENCOUNTER — Other Ambulatory Visit: Payer: Self-pay | Admitting: Family Medicine

## 2022-01-03 DIAGNOSIS — J449 Chronic obstructive pulmonary disease, unspecified: Secondary | ICD-10-CM | POA: Diagnosis not present

## 2022-01-05 ENCOUNTER — Other Ambulatory Visit: Payer: Self-pay | Admitting: Family Medicine

## 2022-01-05 DIAGNOSIS — F32A Depression, unspecified: Secondary | ICD-10-CM

## 2022-01-16 ENCOUNTER — Ambulatory Visit (INDEPENDENT_AMBULATORY_CARE_PROVIDER_SITE_OTHER): Payer: Medicare Other

## 2022-01-16 ENCOUNTER — Ambulatory Visit (INDEPENDENT_AMBULATORY_CARE_PROVIDER_SITE_OTHER): Payer: Medicare Other | Admitting: Family Medicine

## 2022-01-16 ENCOUNTER — Encounter: Payer: Self-pay | Admitting: Family Medicine

## 2022-01-16 VITALS — BP 131/89 | HR 87 | Temp 97.4°F | Ht 68.0 in | Wt 321.4 lb

## 2022-01-16 DIAGNOSIS — F330 Major depressive disorder, recurrent, mild: Secondary | ICD-10-CM | POA: Diagnosis not present

## 2022-01-16 DIAGNOSIS — E1159 Type 2 diabetes mellitus with other circulatory complications: Secondary | ICD-10-CM | POA: Diagnosis not present

## 2022-01-16 DIAGNOSIS — K5909 Other constipation: Secondary | ICD-10-CM

## 2022-01-16 DIAGNOSIS — S8992XA Unspecified injury of left lower leg, initial encounter: Secondary | ICD-10-CM

## 2022-01-16 DIAGNOSIS — I152 Hypertension secondary to endocrine disorders: Secondary | ICD-10-CM

## 2022-01-16 DIAGNOSIS — E1165 Type 2 diabetes mellitus with hyperglycemia: Secondary | ICD-10-CM

## 2022-01-16 DIAGNOSIS — E785 Hyperlipidemia, unspecified: Secondary | ICD-10-CM | POA: Diagnosis not present

## 2022-01-16 DIAGNOSIS — M79662 Pain in left lower leg: Secondary | ICD-10-CM

## 2022-01-16 DIAGNOSIS — E1169 Type 2 diabetes mellitus with other specified complication: Secondary | ICD-10-CM

## 2022-01-16 DIAGNOSIS — Z7409 Other reduced mobility: Secondary | ICD-10-CM | POA: Diagnosis not present

## 2022-01-16 DIAGNOSIS — M1712 Unilateral primary osteoarthritis, left knee: Secondary | ICD-10-CM | POA: Diagnosis not present

## 2022-01-16 LAB — BAYER DCA HB A1C WAIVED: HB A1C (BAYER DCA - WAIVED): 7.6 % — ABNORMAL HIGH (ref 4.8–5.6)

## 2022-01-16 MED ORDER — BUPROPION HCL ER (XL) 150 MG PO TB24: 150.0000 mg | ORAL_TABLET | Freq: Every day | ORAL | 3 refills | Status: DC

## 2022-01-16 NOTE — Progress Notes (Signed)
? ?Subjective: ?Aimee Phillips ?PCP: [name], DO ?GTX:MIWOEH Aimee Phillips is a 51 y.o. female presenting to clinic today for: ? ?1. Depression ?Reports that depression is under much better control with the addition of Wellbutrin.  She would like to continue this along with her Cymbalta 90 mg daily. ? ?2.  Impaired mobility ?She is currently on the wait list for motorized scooter evaluation.  She notes in fact that she had a fall.  She unfortunately fell to her left knee which she already has quite a bit of problems with.  She reports pain and bruising of the left knee.  Cannot have surgery on her left knee until she loses quite a bit of weight ? ?3.  Constipation ?Patient reports chronic constipation where she cannot have a bowel movement without an enema every 4 to 5 days.  She uses stool softener daily.  She feels like she hydrates okay.  She is treated with GLP for diabetes.  She would like to get her blood sugar checked while she is here today.  No reports of blood in stool, nausea or vomiting.  She is tolerating p.o. intake without difficulty ? ? ?ROS: Per HPI ? ?No Known Allergies ?Past Medical History:  ?Diagnosis Date  ? Asthma   ? Bipolar affect, depressed ([place])   ? Cellulitis   ? COPD (chronic obstructive pulmonary disease) ([place])   ? Depression   ? Diabetes mellitus without complication ([place])   ? PE (pulmonary embolism)   ? Pulmonary edema   ? ? ?Current Outpatient Medications:  ?  albuterol (PROVENTIL HFA;VENTOLIN HFA) 108 (90 Base) MCG/ACT inhaler, Inhale 2 puffs into the lungs every 6 (six) hours as needed for wheezing or shortness of breath., Disp: 1 Inhaler, Rfl: 0 ?  Alpha-Lipoic Acid 600 MG CAPS, Take 1 capsule (600 mg total) by mouth daily. For neuropathy, Disp: 90 capsule, Rfl: 3 ?  blood glucose meter kit and supplies, Check BS up to four times a day, Disp: 1 each, Rfl: 0 ?  Blood Glucose Monitoring Suppl DEVI, Check bs up to four times a day, Disp: 1 each, Rfl: 0 ?  budesonide-formoterol  (SYMBICORT) 160-4.5 MCG/ACT inhaler, Inhale 2 puffs into the lungs 2 (two) times daily., Disp: 10.2 g, Rfl: 11 ?  buPROPion (WELLBUTRIN XL) 150 MG 24 hr tablet, TAKE ONE TABLET BY MOUTH DAILY, Disp: 30 tablet, Rfl: 1 ?  cyclobenzaprine (FLEXERIL) 10 MG tablet, Take 1 tablet (10 mg total) by mouth 3 (three) times daily as needed for muscle spasms., Disp: 90 tablet, Rfl: 5 ?  diclofenac Sodium (VOLTAREN) 1 % GEL, Apply 2 g topically 3 (three) times daily as needed (arthritis)., Disp: 200 g, Rfl: 12 ?  dicyclomine (BENTYL) 10 MG capsule, TAKE ONE CAPSULE BY MOUTH EVERY 8 HOURS AS NEEDED SPASMS (FOR ABDOMINAL PAIN), Disp: 60 capsule, Rfl: 5 ?  docusate sodium (COLACE) 100 MG capsule, Take 100 mg by mouth daily., Disp: , Rfl:  ?  DULoxetine (CYMBALTA) 30 MG capsule, TAKE ONE CAPSULE BY MOUTH ONCE DAILY WITH $RemoveBe'60MG'KJETEoxJI$  CAPSULES., Disp: 90 capsule, Rfl: 0 ?  DULoxetine (CYMBALTA) 60 MG capsule, Take 1 capsule (60 mg total) by mouth daily., Disp: 90 capsule, Rfl: 1 ?  furosemide (LASIX) 40 MG tablet, Take 1 tablet (40 mg total) by mouth 2 (two) times daily., Disp: 60 tablet, Rfl: 11 ?  gabapentin (NEURONTIN) 800 MG tablet, Take 800 mg by mouth 3 (three) times daily., Disp: , Rfl:  ?  glucose blood (ACCU-CHEK AVIVA) test  strip, Use to check blood sugar four times daily, Disp: 200 each, Rfl: 12 ?  ibuprofen (IBU) 800 MG tablet, TAKE ONE TABLET BY MOUTH EVERY 8 HOURS AS NEEDED FOR MODERATE PAIN., Disp: 90 tablet, Rfl: 0 ?  insulin glargine (LANTUS SOLOSTAR) 100 UNIT/ML Solostar Pen, Inject 40 Units into the skin 2 (two) times daily., Disp: 70 mL, Rfl: 3 ?  insulin lispro (HUMALOG KWIKPEN) 200 UNIT/ML KwikPen, Inject 10-16 Units into the skin 3 (three) times daily before meals., Disp: 15 mL, Rfl: prn ?  Insulin Pen Needle (NOVOFINE PLUS PEN NEEDLE) 32G X 4 MM MISC, UAD for insulin injection E11.65, Disp: 500 each, Rfl: 3 ?  Lancet Devices MISC, Test QID, DX E11.65, Aviva Plus, Disp: 100 each, Rfl: prn ?  levocetirizine (XYZAL) 5 MG  tablet, Take 1 tablet (5 mg total) by mouth every evening. For allergies, Disp: 90 tablet, Rfl: 3 ?  lisinopril (ZESTRIL) 5 MG tablet, TAKE ONE TABLET BY MOUTH DAILY, Disp: 90 tablet, Rfl: 0 ?  Misc. Devices (TRANSPORT CHAIR) MISC, Needs lift chair for >300lbs, Disp: 1 each, Rfl: 0 ?  omeprazole (PRILOSEC) 20 MG capsule, TAKE ONE CAPSULE BY MOUTH ONCE DAILY, Disp: 90 capsule, Rfl: 0 ?  oxyCODONE-acetaminophen (PERCOCET/ROXICET) 5-325 MG tablet, Take 1 tablet by mouth every 6 (six) hours as needed., Disp: , Rfl:  ?  rosuvastatin (CRESTOR) 20 MG tablet, Take 1 tablet (20 mg total) by mouth daily., Disp: 90 tablet, Rfl: 3 ?  Semaglutide, 1 MG/DOSE, (OZEMPIC, 1 MG/DOSE,) 4 MG/3ML SOPN, INJECT 1ML AS DIRECTED ONCE A WEEK, Disp: 3 mL, Rfl: 2 ?  solifenacin (VESICARE) 10 MG tablet, Take 1 tablet (10 mg total) by mouth daily., Disp: 30 tablet, Rfl: 11 ?  SPIRIVA HANDIHALER 18 MCG inhalation capsule, INHALE ONE PUFF BY MOUTH DAILY. (Patient taking differently: Place 18 mcg into inhaler and inhale daily as needed (asthma).), Disp: 30 capsule, Rfl: 12 ?  sucralfate (CARAFATE) 1 g tablet, TAKE ONE TABLET BY MOUTH FOUR TIMES DAILY WITH MEALS AND AT BEDTIME, Disp: 120 tablet, Rfl: 0 ?  topiramate (TOPAMAX) 100 MG tablet, Take 100 mg by mouth 2 (two) times daily., Disp: , Rfl:  ?Social History  ? ?Socioeconomic History  ? Marital status: Married  ?  Spouse name: Not on file  ? Number of children: 3  ? Years of education: Not on file  ? Highest education level: High school graduate  ?Occupational History  ? Occupation: Disabled  ?Tobacco Use  ? Smoking status: Never  ? Smokeless tobacco: Never  ?Vaping Use  ? Vaping Use: Never used  ?Substance and Sexual Activity  ? Alcohol use: No  ? Drug use: No  ? Sexual activity: Not Currently  ?Other Topics Concern  ? Not on file  ?Social History Narrative  ? Not on file  ? ?Social Determinants of Health  ? ?Financial Resource Strain: Not on file  ?Food Insecurity: Not on file  ?Transportation  Needs: Not on file  ?Physical Activity: Not on file  ?Stress: Not on file  ?Social Connections: Not on file  ?Intimate Partner Violence: Not on file  ? ?Family History  ?Problem Relation Age of Onset  ? Cancer Mother   ?     breast  ? Heart failure Father   ?     died of AMI  ? Cancer Sister   ? Diabetes Brother   ? Heart disease Sister   ? ? ?Objective: ?Office vital signs reviewed. ?BP 131/89   Pulse  87   Temp (!) 97.4 ?F (36.3 ?C)   Ht $R'5\' 8"'Dk$  (1.727 m)   Wt (!) 321 lb 6.4 oz (145.8 kg)   LMP  (LMP Unknown)   SpO2 95%   BMI 48.87 kg/m?  ? ?Physical Examination:  ?General: Awake, alert, morbidly obese female, No acute distress ?HEENT: Sclera white ?Cardio: regular rate and rhythm, S1S2 heard, no murmurs appreciated ?Pulm: clear to auscultation bilaterally, no wheezes, rhonchi or rales; normal work of breathing on room air ?MSK: Arrives in wheelchair.  She has quite a bit of bruising along the patella of the left knee.  Tender to palpation. ? ? ?  12/05/2021  ?  2:01 PM 10/07/2021  ?  2:51 PM 07/07/2021  ?  2:42 PM  ?Depression screen PHQ 2/9  ?Decreased Interest 0 1 0  ?Down, Depressed, Hopeless 0 1 1  ?PHQ - 2 Score 0 2 1  ?Altered sleeping 0 1   ?Tired, decreased energy 0 1   ?Change in appetite 0 2   ?Feeling bad or failure about yourself  1 0   ?Trouble concentrating 1 1   ?Moving slowly or fidgety/restless 0 0   ?Suicidal thoughts 0 0   ?PHQ-9 Score 2 7   ?Difficult doing work/chores Somewhat difficult Somewhat difficult   ? ? ?  12/05/2021  ?  2:01 PM 10/07/2021  ?  2:52 PM 05/11/2021  ?  3:47 PM 11/29/2020  ? 11:17 AM  ?GAD 7 : Generalized Anxiety Score  ?Nervous, Anxious, on Edge $Remov'1 1 1 2  'mYvvfL$ ?Control/stop worrying 0 $RemoveBe'1 1 3  'MgoDiarsC$ ?Worry too much - different things $RemoveBeforeDE'1 1 1 3  'ZVMCHWQzHFjOgRv$ ?Trouble relaxing $RemoveBeforeDEI'1 1 1 3  'PgrFjfUUqaLirEBK$ ?Restless 1 0 1 2  ?Easily annoyed or irritable $RemoveBefo'3 1 1 3  'YIsrbHKHxxy$ ?Afraid - awful might happen 0 0 0 0  ?Total GAD 7 Score $Remov'7 5 6 16  'yVxGBK$ ?Anxiety Difficulty Somewhat difficult Somewhat difficult Somewhat difficult Somewhat difficult   ? ? ?Assessment/ Plan: ?51 y.o. female  ? ?Chronic constipation ? ?Mild episode of recurrent major depressive disorder ([place]) - Plan: buPROPion (WELLBUTRIN XL) 150 MG 24 hr tablet ? ?Uncontrolled type 2 diab

## 2022-01-17 LAB — CMP14+EGFR
ALT: 24 IU/L (ref 0–32)
AST: 44 IU/L — ABNORMAL HIGH (ref 0–40)
Albumin/Globulin Ratio: 1.3 (ref 1.2–2.2)
Albumin: 4 g/dL (ref 3.8–4.8)
Alkaline Phosphatase: 91 IU/L (ref 44–121)
BUN/Creatinine Ratio: 11 (ref 9–23)
BUN: 8 mg/dL (ref 6–24)
Bilirubin Total: 0.2 mg/dL (ref 0.0–1.2)
CO2: 23 mmol/L (ref 20–29)
Calcium: 9.3 mg/dL (ref 8.7–10.2)
Chloride: 102 mmol/L (ref 96–106)
Creatinine, Ser: 0.74 mg/dL (ref 0.57–1.00)
Globulin, Total: 3.1 g/dL (ref 1.5–4.5)
Glucose: 172 mg/dL — ABNORMAL HIGH (ref 70–99)
Potassium: 4.1 mmol/L (ref 3.5–5.2)
Sodium: 138 mmol/L (ref 134–144)
Total Protein: 7.1 g/dL (ref 6.0–8.5)
eGFR: 99 mL/min/{1.73_m2} (ref >59)

## 2022-01-17 MED ORDER — SEMAGLUTIDE (2 MG/DOSE) 8 MG/3ML ~~LOC~~ SOPN: 2.0000 mg | PEN_INJECTOR | SUBCUTANEOUS | 12 refills | Status: DC

## 2022-01-17 NOTE — Addendum Note (Signed)
Addended by: Raliegh Ip on: 01/17/2022 12:17 PM ? ? Modules accepted: Orders ? ?

## 2022-01-31 ENCOUNTER — Other Ambulatory Visit: Payer: Self-pay | Admitting: Family Medicine

## 2022-01-31 ENCOUNTER — Telehealth: Payer: Self-pay | Admitting: Family Medicine

## 2022-01-31 DIAGNOSIS — K5909 Other constipation: Secondary | ICD-10-CM

## 2022-01-31 MED ORDER — LINACLOTIDE 290 MCG PO CAPS: 290.0000 ug | ORAL_CAPSULE | Freq: Every day | ORAL | 12 refills | Status: DC

## 2022-01-31 NOTE — Progress Notes (Signed)
Failed Trulance, Miralax, Colace, and OTC laxatives. High dose Linzess worked well.  Meds ordered this encounter  Medications   linaclotide (LINZESS) 290 MCG CAPS capsule    Sig: Take 1 capsule (290 mcg total) by mouth daily before breakfast.    Dispense:  30 capsule    Refill:  12

## 2022-01-31 NOTE — Telephone Encounter (Signed)
Pt aware rx sent.  

## 2022-01-31 NOTE — Telephone Encounter (Signed)
Rx sent.  Hopefully, ins will cover.  If not, we'll try for [place]

## 2022-02-02 DIAGNOSIS — J449 Chronic obstructive pulmonary disease, unspecified: Secondary | ICD-10-CM | POA: Diagnosis not present

## 2022-02-17 DIAGNOSIS — G5603 Carpal tunnel syndrome, bilateral upper limbs: Secondary | ICD-10-CM | POA: Diagnosis not present

## 2022-02-17 DIAGNOSIS — M542 Cervicalgia: Secondary | ICD-10-CM | POA: Diagnosis not present

## 2022-02-17 DIAGNOSIS — Z79891 Long term (current) use of opiate analgesic: Secondary | ICD-10-CM | POA: Diagnosis not present

## 2022-02-17 DIAGNOSIS — M79673 Pain in unspecified foot: Secondary | ICD-10-CM | POA: Diagnosis not present

## 2022-02-17 DIAGNOSIS — M545 Low back pain, unspecified: Secondary | ICD-10-CM | POA: Diagnosis not present

## 2022-02-17 DIAGNOSIS — E1140 Type 2 diabetes mellitus with diabetic neuropathy, unspecified: Secondary | ICD-10-CM | POA: Diagnosis not present

## 2022-02-17 DIAGNOSIS — M25562 Pain in left knee: Secondary | ICD-10-CM | POA: Diagnosis not present

## 2022-02-17 DIAGNOSIS — G4733 Obstructive sleep apnea (adult) (pediatric): Secondary | ICD-10-CM | POA: Diagnosis not present

## 2022-02-21 ENCOUNTER — Telehealth: Payer: Self-pay | Admitting: Family Medicine

## 2022-02-21 NOTE — Telephone Encounter (Signed)
Patient was told by her neurologist that she needed a sleep study and wants to know if we would be able to put in a referral for one. Please call her back to let her know if this can be done or if she needs an appt.

## 2022-02-21 NOTE — Telephone Encounter (Signed)
Telephone visit scheduled

## 2022-02-21 NOTE — Telephone Encounter (Signed)
I do have to put in a note.  Ok to schedule her for a virtual visit if needed and I can do it that way.

## 2022-02-23 ENCOUNTER — Other Ambulatory Visit: Payer: Self-pay | Admitting: Family Medicine

## 2022-02-23 DIAGNOSIS — F331 Major depressive disorder, recurrent, moderate: Secondary | ICD-10-CM

## 2022-02-25 ENCOUNTER — Encounter (HOSPITAL_COMMUNITY): Payer: Self-pay | Admitting: Emergency Medicine

## 2022-02-25 ENCOUNTER — Emergency Department (HOSPITAL_COMMUNITY)
Admission: EM | Admit: 2022-02-25 | Discharge: 2022-02-25 | Discharge status: Against Medical Advice | Payer: Medicare Other | Attending: Emergency Medicine | Admitting: Emergency Medicine

## 2022-02-25 ENCOUNTER — Other Ambulatory Visit: Payer: Self-pay

## 2022-02-25 DIAGNOSIS — W01198A Fall on same level from slipping, tripping and stumbling with subsequent striking against other object, initial encounter: Secondary | ICD-10-CM | POA: Diagnosis not present

## 2022-02-25 DIAGNOSIS — Z5321 Procedure and treatment not carried out due to patient leaving prior to being seen by health care provider: Secondary | ICD-10-CM | POA: Insufficient documentation

## 2022-02-25 DIAGNOSIS — M25562 Pain in left knee: Secondary | ICD-10-CM | POA: Insufficient documentation

## 2022-02-25 DIAGNOSIS — M79602 Pain in left arm: Secondary | ICD-10-CM | POA: Insufficient documentation

## 2022-02-25 DIAGNOSIS — Y92009 Unspecified place in unspecified non-institutional (private) residence as the place of occurrence of the external cause: Secondary | ICD-10-CM | POA: Diagnosis not present

## 2022-02-25 NOTE — ED Triage Notes (Signed)
Patient brought in via rescue truck after falling today at home. Alert and oriented. Airway patent. Per patient her left knee "gave up after slipping on water." Patient c/o left arm/shoulder pain and left knee pain. Per patient unable to raise left arm. Patient denies hitting head or LOC. Denies taking any type of anticoagulant. Bruising noted to left knee. Denies taking anything for pain.  

## 2022-02-26 DIAGNOSIS — J449 Chronic obstructive pulmonary disease, unspecified: Secondary | ICD-10-CM | POA: Diagnosis not present

## 2022-02-26 DIAGNOSIS — I10 Essential (primary) hypertension: Secondary | ICD-10-CM | POA: Diagnosis not present

## 2022-02-26 DIAGNOSIS — S42202A Unspecified fracture of upper end of left humerus, initial encounter for closed fracture: Secondary | ICD-10-CM | POA: Diagnosis not present

## 2022-02-26 DIAGNOSIS — S42342A Displaced spiral fracture of shaft of humerus, left arm, initial encounter for closed fracture: Secondary | ICD-10-CM | POA: Diagnosis not present

## 2022-02-26 DIAGNOSIS — M79602 Pain in left arm: Secondary | ICD-10-CM | POA: Diagnosis not present

## 2022-02-26 DIAGNOSIS — E1143 Type 2 diabetes mellitus with diabetic autonomic (poly)neuropathy: Secondary | ICD-10-CM | POA: Diagnosis not present

## 2022-02-26 DIAGNOSIS — E7800 Pure hypercholesterolemia, unspecified: Secondary | ICD-10-CM | POA: Diagnosis not present

## 2022-02-26 DIAGNOSIS — W1839XA Other fall on same level, initial encounter: Secondary | ICD-10-CM | POA: Diagnosis not present

## 2022-02-26 DIAGNOSIS — K3184 Gastroparesis: Secondary | ICD-10-CM | POA: Diagnosis not present

## 2022-02-27 ENCOUNTER — Ambulatory Visit: Payer: Self-pay | Admitting: Student

## 2022-02-27 DIAGNOSIS — S42202A Unspecified fracture of upper end of left humerus, initial encounter for closed fracture: Secondary | ICD-10-CM | POA: Diagnosis not present

## 2022-02-28 ENCOUNTER — Ambulatory Visit: Payer: Medicare Other | Attending: Family Medicine

## 2022-02-28 ENCOUNTER — Other Ambulatory Visit: Payer: Self-pay | Admitting: Family Medicine

## 2022-02-28 DIAGNOSIS — M6281 Muscle weakness (generalized): Secondary | ICD-10-CM | POA: Insufficient documentation

## 2022-02-28 DIAGNOSIS — Z7409 Other reduced mobility: Secondary | ICD-10-CM | POA: Insufficient documentation

## 2022-02-28 DIAGNOSIS — R262 Difficulty in walking, not elsewhere classified: Secondary | ICD-10-CM | POA: Diagnosis not present

## 2022-02-28 DIAGNOSIS — Z993 Dependence on wheelchair: Secondary | ICD-10-CM | POA: Insufficient documentation

## 2022-02-28 DIAGNOSIS — E1159 Type 2 diabetes mellitus with other circulatory complications: Secondary | ICD-10-CM

## 2022-02-28 NOTE — Therapy (Signed)
OUTPATIENT PHYSICAL THERAPY WHEELCHAIR EVALUATION   Patient Name: Aimee Phillips MRN: [id number] DOB:Aug 08, 1971, 51 y.o., female Today's Date: 02/28/2022   PT End of Session - 02/28/22 1116     Visit Number 1    Number of Visits 1    Authorization Type [employer] Medicare    PT Start Time 1140    PT Stop Time 1213    PT Time Calculation (min) 33 min    Activity Tolerance Patient tolerated treatment well    Behavior During Therapy WFL for tasks assessed/performed             Past Medical History:  Diagnosis Date   Asthma    Bipolar affect, depressed (HCC)    Cellulitis    COPD (chronic obstructive pulmonary disease) (HCC)    Depression    Diabetes mellitus without complication (HCC)    PE (pulmonary embolism)    Pulmonary edema    Past Surgical History:  Procedure Laterality Date   CESAREAN SECTION     CHOLECYSTECTOMY     ESOPHAGOGASTRODUODENOSCOPY (EGD) WITH PROPOFOL N/A 10/29/2020   Procedure: ESOPHAGOGASTRODUODENOSCOPY (EGD) WITH PROPOFOL;  Surgeon: Dolores Frame, MD;  Location: [facility];  Service: Gastroenterology;  Laterality: N/A;  12:30   Patient Active Problem List   Diagnosis Date Noted   Gastroparesis 12/22/2020   Chronically on opiate therapy 12/22/2020   Hypertension associated with diabetes (HCC) 07/28/2020   Abdominal pain, chronic, epigastric 06/25/2020   Constipation 06/25/2020   Essential hypertension, benign 11/04/2019   Mixed hyperlipidemia 11/04/2019   Neuropathy 01/02/2019   Lower abdominal pain 05/15/2018   Cystocele with prolapse 05/15/2018   Uncontrolled type 2 diabetes mellitus with hyperglycemia (HCC) 03/12/2018   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   Chronic obstructive pulmonary disease (HCC) 03/05/2018   Cyst of left ovary 01/18/2018   Pelvic pain 01/18/2018   Chronic right-sided low back pain 04/20/2017   At high risk for falls 04/20/2017   Muscle weakness 04/20/2017   Injury of back  02/02/2017   Chronic left-sided low back pain with left-sided sciatica 02/02/2017   Deformity of toenail 02/02/2017   Hyperlipidemia associated with type 2 diabetes mellitus (HCC) 02/02/2017   Well adult exam 02/02/2017   Morbid obesity (HCC) 08/01/2011   Obesity hypoventilation syndrome (HCC) 08/01/2011   Bipolar affect, depressed (HCC)     PCP: Delynn Flavin, DO  REFERRING PROVIDER: Delynn Flavin, DO   THERAPY DIAG:  Difficulty in walking, not elsewhere classified  Muscle weakness (generalized)  Rationale for Evaluation and Treatment Rehabilitation  SUBJECTIVE:  SUBJECTIVE STATEMENT: Pt present or wheelchair evaluation. She reports doing fair- recently broke her L UE falling in her bathroom. Plan is to have an ORIF done 6/22.  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   Diagnosis code:         Diagnosis: Decreased Mobility and Endurance Z74.09 Diagnosis  Code: E66.01 Diagnosis: morbid obesity   [] Progressive disease  Relevant future surgeries:   ORIF to L proximal humerus   Height: 5'9" Weight: 316lbs Explain recent changes or trends in weight:      History:  Past Medical History:  Diagnosis Date   Asthma    Bipolar affect, depressed ([place])    Cellulitis    COPD  (chronic obstructive pulmonary disease) ([place])    Depression    Diabetes mellitus without complication ([place])    PE (pulmonary embolism)    Pulmonary edema             Cardio Status:  Functional Limitations:   [x] Intact  []  Impaired      Respiratory Status:  Functional Limitations:   [] Intact  [x] Impaired   [] SOB [] COPD [x] O2 Dependent __3____LPM  [] Ventilator Dependent  Resp equip:                                                     Objective Measure(s):   Orthotics:   [] Amputee:                                                             [] Prosthesis:      HOME ENVIRONMENT:  [] House [] Condo/town home [x] Apartment [] Asst living [] LTCF         [] Own  [x] Rent   [] Lives alone [x] Lives with others -     husband and 96 yo son             Hours without assistance: 0  [x] Home is accessible to patient            Threshold to enter house, no steps within house; doorways wide enough for wc                     Storage of wheelchair:  [x] In home   [] Other Comments:       COMMUNITY :  TRANSPORTATION:  [x] Car [] Van [] Public Transportation [] Adapted w/c Lift []  Ambulance [] Other:                     [] Sits in wheelchair during transport   Where is w/c stored during transport? Family able to lift wc into the car  [] [place]  []  EZ Geographical information systems officer  r   [] Self-Driver       Drive while in  Wheelchair [] yes [x] no   Employment and/or school: Patient on disability  Specific requirements pertaining to mobility        Other:  COMMUNICATION:  Verbal Communication  [x] WFL [] receptive [x] WFL [] expressive [] Understandable  [] Difficult to understand  [] non-communicative  Primary Language:____english __________ 2nd:_____________  Communication provided by:[x] Patient [x] Family [] Caregiver [] Translator   [] Uses an Retail banker device     Manufacturer/Model :    MOBILITY/BALANCE:  Sitting Balance  Standing Balance  Transfers  Ambulation   [] WFL      [] WFL  [] Independent  []  Independent   [x] Uses UE  for balance in sitting Comments:  [x] Uses UE/device for stability Comments: Uses rollator or family to assist to minimize risk for falling  [x]  Min assist  []  Ambulates independently with       device:___________________      []  Mod assist  []  Able to ambulate ______ feet        safely/functionally/independently   []  Min assist  []  Min assist  []  Max assist  [x]  Non-functional ambulator         History/High risk of falls   []  Mod assist  []  Mod assist  []  Dependent  []  Unable to ambulate   []  Max  assist  []  Max assist  Transfer method:r1 person r2 person rsliding board rsquat pivot stand pivot rmechanical patient lift  rother:   []  Unable  []  Unable    Fall History: # of falls in the past 6 months? 3; most recent fall resulted in L proximal humerus fx (requiring ORIF)  # of "near" falls in the past 6 months? ~6     CURRENT SEATING / MOBILITY:  Current Mobility Device: [] None [] Cane/Walker [x] Manual [] Dependent [] Dependent w/ Tilt rScooter [] Power (type of control):   Manufacturer: adapt Model:  Serial #:   Size: 22x16 Color:  Age: ~1 year  Purchased by whom:   Current condition of mobility base:    Current seating system:                                                                       Age of seating system:    Describe posture in present seating system:    Is the current mobility meeting medical necessity?:  [] Yes [x] No Describe: Patient is only able to propel standard wc using her feet short distances. She is unable to propel the wc using her upper extremities.   Ability to complete Mobility-Related Activities of Daily Living (MRADL's) with Current Mobility Device:   Move room to room  [] Independent  [] Min [x] Mod [] Max assist  [] Unable  Comments:   Meal prep  [] Independent  [x] Min [] Mod [] Max assist  [] Unable    Feeding  [x] Independent  [] Min [] Mod [] Max assist  [] Unable    Bathing  [] Independent  [x] Min [] Mod [] Max assist  [] Unable    Grooming  [] Independent  [x] Min [] Mod [] Max  assist  [] Unable    UE dressing  [] Independent  [x] Min [] Mod [] Max assist  [] Unable    LE dressing  [] Independent   [x] Min [] Mod [] Max assist  [] Unable    Toileting  [] Independent  [x] Min [] Mod [] Max assist  [] Unable  Bowel Mgt: [x]  Continent []  Incontinent []  Accidents []  Diapers []  Colostomy []  Bowel Program:  Bladder Mgt: [x]  Continent []  Incontinent []  Accidents []  Diapers []  Urinal []  Intermittent Cath []  Indwelling Cath []  Supra-pubic Cath     Current Mobility Equipment Trialed/ Ruled Out:    Does not meet mobility needs due to:    Mark all boxes that indicate inability to use the specific equipment listed     Meets needs for safe  independent functional  ambulation  / mobility    Risk of  Falling or History of Falls    Enviromental limitations      Cognition    Safety concerns with  physical ability    Decreased / limitations endurance  & strength     Decreased / limitations  motor skills  & coordination    Pain    Pace /  Speed    Cardiac and/or  respiratory condition    Contra - indicated by diagnosis   Cane/Crutches  []   [x]   []   []   [x]   [x]   [x]   [x]   [x]   [x]   []    Walker / Rollator  []  NA   []   [x]   []   []   [x]   [x]   [x]   [x]   [x]   [x]   []     Manual Wheelchair K0001-K0007:  []  NA  []   []   []   []   [x]   [x]   [x]   [x]   [x]   [x]   []    Manual W/C (K0005) with power assist  []  NA  []   []   []   []   []   []   []   []   []   []   []    Scooter  []  NA  []   []   []   []   []   []   []   []   []   []   []    Power Wheelchair: standard joystick  []  NA  [x]   []   []   []   []   []   []   []   []   []   []    Power Wheelchair: alternative controls  []  NA  []   []   []   []   []   []   []   []   []   []   []    Summary:  The least costly alternative for independent functional mobility was found to be:    []  Crutch/Cane  []  Walker []  Manual w/c  []  Manual w/c with power assist   []  Scooter   [x]  Power w/c std joystick   []  Power w/c alternative control        []  Requires dependent care mobility device    Media planner for [employer] skills are adequate for safe mobility equipment operation  [x]   Yes []   No  Patient is willing and motivated to use recommended mobility equipment  [x]   Yes []   No       []  Patient is unable to safely operate mobility equipment independently and requires dependent care equipment Comments:           SENSATION and SKIN ISSUES:  Sensation [x]  Intact  []  Impaired []  Absent []  Hyposensate []  Hypersensate  []  Defensiveness  Location(s) of impairment:  Pressure Relief Method(s):  [x]  Lean side to side to offload (without risk of falling)  []   W/C push up (4+ times/hour for 15+ seconds) []  Stand up (without risk of falling)    []  Other: (Describe): Effective pressure relief method(s) above can be performed consistently throughout the day: rYes  r No  If not, Why?:  Skin Integrity Risk:       [x]  Low risk           []  Moderate risk            []  High risk  If high risk, explain:   Skin Issues/Skin Integrity  Current skin Issues  []  Yes [x]  No [x]  Intact  []   Red area   []   Open area  []  Scar tissue  []  At risk from prolonged sitting  Where: History of Skin Issues  []  Yes [x]  No Where : When: Stage: Hx of skin flap surgeries  []  Yes [x]  No Where:  When:  Pain: [x]  Yes []  No   Pain Location(s): B LE, back Intensity scale: (0-10) : 8 How does pain interfere with mobility and/or MRADLs? - Patient experiences multiple areas of pain limiting her ability to maintain her balance and have adequate balance to complete her mobility related ADLs.     MAT EVALUATION:  Neuro-Muscular Status: (Tone, Reflexive, Responses, etc.)     [x]   Intact   []  Spasticity:  []  Hypotonicity  []  Fluctuating  []  Muscle Spasms  []  Poor Righting Reactions/Poor Equilibrium Reactions  []  Primal Reflex(s):    Comments:            COMMENTS:    POSTURE:     Comments:  Pelvis Anterior/Posterior:  []  Neutral   [x]  Posterior  []  Anterior  []   Fixed - No movement [x]  Tendency away from neutral [x]  Flexible [x]  Self-correction [x]  External correction Obliquity (viewed from front)  [x]  WFL []  R Obliquity []  L Obliquity  []  Fixed - No movement []  Tendency away from neutral []  Flexible []  Self-correction []  External correction Rotation  [x]  WFL []  R anterior []  L anterior  []  Fixed - No movement []  Tendency away from neutral []  Flexible []  Self-correction []  External correction Tonal Influence Pelvis:  [x]  Normal []  Flaccid []  Low tone []  Spasticity []  Dystonia []  Pelvis thrust []  Other:    Trunk Anterior/Posterior:  []  WFL [x]  Thoracic kyphosis []  Lumbar lordosis  []  Fixed - No movement [x]  Tendency away from neutral [x]  Flexible [x]  Self-correction [x]  External correction  [x]  WFL []  Convex to left  []  Convex to right []  S-curve   []  C-curve []  Multiple curves []  Tendency away from neutral []  Flexible []  Self-correction []  External correction Rotation of shoulders and upper trunk:  [x]  Neutral []  Left-anterior []  Right- anterior []  Fixed- no movement []  Tendency away from neutral []  Flexible []  Self correction []  External correction Tonal influence Trunk:  [x]  Normal []  Flaccid []  Low tone []  Spasticity []  Dystonia []  Other:   Head & Neck  [x]  Functional []  Flexed    []  Extended []  Rotated right  []  Rotated left []  Laterally flexed right []  Laterally flexed left []  Cervical hyperextension   [x]  Good head control []  Adequate head control []  Limited head control []  Absent head control Describe tone/movement of head and neck:    Lower Extremity Measurements: LE MMT:  MMT Right 02/28/2022 Left 02/28/2022  Hip flexion 3 3  Hip extension    Hip abduction 3 3  Hip adduction 3 3  Knee flexion 3 3  Knee extension 3 3  Ankle dorsiflexion 3 3  Ankle plantarflexion 3 3   (Blank rows = not tested)  Hip positions:  []  Neutral   [x]  Abducted   []  Adducted  []  Subluxed   []   Dislocated   []   Fixed   [x]  Tendency away from neutral [x]  Flexible [x]  Self-correction [x]  External correction   Hip Windswept:[x]  Neutral  []  Right    []  Left  []  Subluxed   []  Dislocated   []  Fixed   []  Tendency away from neutral []  Flexible []  Self-correction []  External correction  LE Tone: [x]  Normal []  Low tone []  Spasticity []  Flaccid []  Dystonia []  Rocks/Extends at hip []  Thrust into knee extension []  Pushes legs downward into footrest  LE Edema: [x]  1+ (Barely detectable impression when finger is pressed into skin) []  2+ (slight indentation. 15 seconds to rebound) []  3+ (deeper indentation. 30 seconds to rebound) []  4+ (>30 seconds to rebound)  UE Measurements:  UPPER EXTREMITY MMT:  MMT Right 02/28/2022 Left 02/28/2022  Shoulder flexion 3   Shoulder abduction 3   Shoulder adduction    Elbow flexion 4   Elbow extension 4   Wrist flexion 4   Wrist extension 4   Pinch strength    Grip strength    (Blank rows = not tested) *unable to assess L UE at this time due to humeral fx  Shoulder Posture:  Right Tendency towards Left  []   Functional []    []   Elevation []    [x]   Depression [x]    [x]   Protraction [x]    []   Retraction []    [x]   Internal rotation [x]    []   External rotation []    []   Subluxed []     UE Tone: [x]  Normal []  Flaccid []  Low tone []  Spasticity  []  Dystonia []  Other:   UE Edema: [x]  1+ (Barely detectable impression when finger is pressed into skin) []  2+ (slight indentation. 15 seconds to rebound) []  3+ (deeper indentation. 30 seconds to rebound) []  4+ (>30 seconds to rebound)  Wrist/Hand: Handedness: []  Right   [x]  Left   []  NA: Comments:  Right  Left  []   WNL []    []   Limitations []    []   Contractures []    []   Fisting []    []   Tremors []    [x]   Weak grasp [x]    []   Poor dexterity []    []   Hand movement non functional []    []   Paralysis []     MOBILITY BASE RECOMMENDATIONS and JUSTIFICATION:  MOBILITY BASE   JUSTIFICATION   Manufacturer:   Adapt  Model:        Tourist information centre manager HD                  Color: Blue  Seat Width:  20" Seat Depth 18"   []  Manual mobility base (continue below)   []  Scooter/POV  [x]  Power mobility base   Number of hours per day spent in above selected mobility base: 10-12 hrs  Typical daily mobility base use Schedule:    [x]  is not a safe, functional ambulator  [x]  limitation prevents from completing a MRADL(s) within a reasonable time frame    [x]  limitation places at high risk of morbidity or mortality secondary to  the attempts to perform a    MRADL(s)  []  limitation prevents accomplishing a MRADL(s) entirely  [x]  provide independent mobility  [x]  equipment is a lifetime medical need  [x]  walker or cane inadequate  [x]  any type manual wheelchair      inadequate  [x]  scooter/POV inadequate      []  requires dependent mobility         POWER MOBILITY      []  Scooter/POV    []   can safely operate   []  can safely transfer   []  has adequate trunk stability   []  cannot functionally propel  manual wheelchair    [x]  Power mobility base   Jazzy Elite HD [x]  non-ambulatory   [x]  cannot functionally propel manual wheelchair   [x]  cannot functionally and safely      operate scooter/POV  [x]  can safely operate power       wheelchair  [x]  home is accessible  [x]  willing to use power wheelchair     Tilt  []  Powered tilt on powered chair  []  Powered tilt on manual chair []  Manual tilt on manual chair Comments:  []  change position for pressure      []  elief/cannot weight shift   []  change position against      gravitational force on head and      shoulders   []  decrease pain  []  blood pressure management   []  control autonomic dysreflexia  []  decrease respiratory distress  []  management of spasticity  []  management of low tone  []  facilitate postural control   []  rest periods   []  control edema  []  increase sitting tolerance   []  aid with transfers     POWER  WHEELCHAIR CONTROLS      Controls/input device  []  Expandable  []  Non-expandable  []  Proportional  []  Right Hand []  Left Hand  []  Non-proportional/switches/head-array  []  Electrical/proximity         []   Mechanical      Manufacturer:___________________   Type:___ _______________ []  provides access for controlling wheelchair  []  programming for accurate control  []  progressive disease/changing condition  []  required for alternative drive      controls       []  lacks motor control to operate  proportional drive control  []  unable to understand proportional controls  []  limited movement/strength  []  extraneous movement / tremors / ataxic / spastic     []  Upgraded electronics      controller/harness    []  Single power (tilt or recline)   []  Expandable    []  Non-expandable plus   []  Multi-power (tilt, recline, power legrest, power seat lift, vertical positioning system, stand)  []  allows input device to communicate with drive motors  []  harness provides necessary     connections between the controller, input device, and seat functions     []  needed in order to operate   power seat functions through    joystick/ input device  []  required for alternative drive      controls     []  Enhanced display  []  required to connect all alternative drive controls   []  required for upgraded joystick      (lite-throw, heavy duty, micro)  []  Allows user to see in which mode and drive the wheelchair is set; necessary for alternate controls       []  Upgraded tracking electronics  []  correct tracking when on uneven surfaces makes switch driving more efficient and less fatiguing  []  increase safety when driving  []  increase ability to traverse      thresholds    []  Safety / reset / mode switches     Type:    []  Used to change modes and stop the wheelchair when driving     [x]  Mount for joystick / input device/switches  [x]  swing away for access or      transfers   [x]  attaches joystick / input device /  switches to  wheelchair   [x]  provides for consistent access  []  midline for optimal placement    []  Attendant controlled joystick plus     mount  []  safety  []  long distance driving  []  operation of seat functions  []  compliance with transportation regulations    [x]  Battery  [x]  required to power (power assist / scooter/ power wc / other):       []  Power inverter (24V to 12V)  []  required for ventilator / respiratory equipment / other:     CHAIR OPTIONS MANUAL & POWER      Armrests   [x]  adjustable height rremovable  []  swing away []  fixed  [x]  flip back  []  reclining  [x]  full length pads []  desk []  tube arms []  gel pads  [x]  provide support with elbow at 90    [x]  remove/flip back/swing away for  transfers  [x]  provide support and positioning of upper body    []  allow to come closer to table top  [x]  remove for access to tables  []  provide support for w/c tray  [x]  change of height/angles for       variable activities   []  Elbow support / Elbow stop  []  keep elbow positioned on arm pad  []  keep arms from falling off arm pad      during tilt and/or recline   Upper Extremity Support  []  Arm trough  []   R  []   L  Style:  []  swivel mount []  fixed mount   []  posterior hand support  []   tray  []  full tray  []  joystick cut out  []   R  []   L  Style:  []  decrease gravitational pull on      shoulders  []  provide support to increase UE  function  []  provide hand support in natural    position  []  position flaccid UE  []  decrease subluxation    rdecrease edema       []  manage spasticity   []  provide midline positioning  []  provide work surface  []  placement for AAC/ Computer/ EADL     Hangers/ Legrests   []  ______ degree  []  Elevating []  articulating  []  swing away []  fixed []  lift off  []  heavy duty []  adjustable knee angle  []  adjustable calf panel   []  longer extension tube              []  provide LE support  []  maintain placement of feet on      footplate   []  accommodate  lower leg length  []  accommodate to hamstring       tightness  []  enable transfers  []  provide change in position for LE's  []  elevate legs during recline    []  decrease edema  []  durability      Foot support   [x]  footplate []  R []  L [x]  flip up           []  Depth adjustable   []  angle adjustable  []  foot board/one piece    [x]  provide foot support  []  accommodate to ankle ROM  []  allow foot to go under wheelchair base  [x]  enable transfers     []  Shoe holders  []  position foot    []  decrease / manage spasticity  []  control position of LE  []  stability    []  safety     []  Ankle strap/heel      loops  []  support foot  on foot support  []  decrease extraneous movement  []  provide input to heel   []  protect foot     []  Amputee adapter []  R  []  L     Style:                  Size:  []  Provide support for stump/residual extremity    []  Transportation tie-down  []  to provide crash tested tie-down brackets    []  Crutch/cane holder    []  O2 holder    []  IV hanger   []  Ventilator tray/mount    []  stabilize accessory on wheelchair       Component  Justification     []  Seat cushion      []  accommodate impaired sensation  []  decubitus ulcers present or history  []  unable to shift weight  []  increase pressure distribution  []  prevent pelvic extension  []  custom required "off-the-shelf"    seat cushion will not accommodate deformity  []  stabilize/promote pelvis alignment  []  stabilize/promote femur alignment  []  accommodate obliquity  []  accommodate multiple deformity  []  incontinent/accidents  []  low maintenance     []  seat mounts                 []  fixed []  removable  []  attach seat platform/cushion to wheelchair frame    []  Seat wedge    []  provide increased aggressiveness of seat shape to decrease sliding  down in the seat  []  accommodate ROM        []  Cover replacement   []  protect back or seat cushion  []  incontinent/accidents    []  Solid seat / insert    []  support cushion  to prevent      hammocking  []  allows attachment of cushion to mobility base    []  Lateral pelvic/thigh/hip     support (Guides)     []  decrease abduction  []  accommodate pelvis  []  position upper legs  []  accommodate spasticity  []  removable for transfers     []  Lateral pelvic/thigh      supports mounts  []  fixed   []  swing-away   []  removable  []  mounts lateral pelvic/thigh supports     []  mounts lateral pelvic/thigh supports swing-away or removable for transfers    []  Medial thigh support ([place])  rdecrease adduction raccommodate ROM  rremove for transfers    ralignment      []  Medial thigh   []  fixed      support mounts      []  swing-away   []  removable  []  mounts medial thigh supports   []  Mounts medial supports swing- away or removable for transfers     Component  Justification   []  Back       []  provide posterior trunk support []  facilitate tone  []  provide lumbar/sacral support []  accommodate deformity  []  support trunk in midline   []  custom required "off-the-shelf" back support will not accommodate deformity   []  provide lateral trunk support []  accommodate or decrease tone            []  Back mounts  []  fixed  []  removable  []  attach back rest/cushion to wheelchair frame   []  Lateral trunk      supports  []  R []  L  []  decrease lateral trunk leaning  []  accommodate asymmetry    []  contour for increased contact  []  safety    []  control of tone    []   Lateral trunk      supports mounts  []  fixed  []  swing-away   []  removable  []  mounts lateral trunk supports     []  Mounts lateral trunk supports swing-away or removable for transfers   []  Anterior chest      strap, vest     []  decrease forward movement of shoulder  []  decrease forward movement of trunk  []  safety/stability  []  added abdominal support  []  trunk alignment  []  assistance with shoulder control   []  decrease shoulder elevation    []  Headrest      []  provide posterior head support  []  provide posterior  neck support  []  provide lateral head support  []  provide anterior head support  []  support during tilt and recline  []  improve feeding     []  improve respiration  []  placement of switches  []  safety    []  accommodate ROM   []  accommodate tone  []  improve visual orientation   []  Headrest           []  fixed []  removable []  flip down      Mounting hardware   []  swing-away laterals/switches  []  mount headrest   []  mounts headrest flip down or  removable for transfers  []  mount headrest swing-away laterals   []  mount switches     []  Neck Support    []  decrease neck rotation  []  decrease forward neck flexion   Pelvic Positioner    []  std hip belt          []  padded hip belt  []  dual pull hip belt  []  four point hip belt  []  stabilize tone  []  decrease falling out of chair  []  prevent excessive extension  []  special pull angle to control      rotation  []  pad for protection over boney      prominence  []  promote comfort    []  Essential needs        bag/pouch   []  medicines []  special food rorthotics []  clothing changes []  diapers   []  catheter/hygiene []  ostomy supplies   The above equipment has a life- long use expectancy.  Growth and changes in medical and/or functional conditions would be the exceptions.   SUMMARY:  Why mobility device was selected; include why a lower level device is not appropriate:   ASSESSMENT:  CLINICAL IMPRESSION: Aimee Phillips is a 51 y.o. female who was seen today for physical therapy evaluation for assessment of wheelchair necessity. She requires a group 2 heavy duty power wheelchair- [employer] HD. She has suffered numerous falls that have resulted in significant injuries. She requires the use of a power wheelchair to safely maneuver within her home because of this. She most recently suffered a L proximal humerus fracture that now requires an ORIF due a fall within her home. She does have Type II diabetes, which will delay her healing and increase her risk  for complications from this fall and any subsequent falls. She is unable to safely ambulate within her house, both with an assistive device and without an assistive device due to bilateral lower extremity neuropathy. While she does have functional strength to be able to ambulate, her neuropathy limits the distance she can safely ambulate to ~10-28ft. What further compounds her limited endurance and therefore ability to safely move within her home is her diagnosis of COPD resulting in her dependence on 3 Liters of supplemental Oxygen.  Ms. Camhi is unable to  safely propel a manual wheelchair using her upper extremities due to weakness and her impaired respiratory status. She also currently has a significant fracture to her L proximal humerus preventing her from propelling a manual wheelchair. While she can use her feet to propel short distances, it is not safe, nor effective to do so. She will require a joystick with swing away mount to allow her to control her [employer] HD power chair. The swing away mount will allow her to move the joystick out of her way to transfer in and out of the chair. A battery is necessary to power the chair. Full length, flip back height-adjustable arm rests are necessary to support her upper extremities at an appropriate angle. Flip-back is necessary to move the arm rests out of the way for safe transfers. Ms. Mcgrain will require a foot plate that is center-mounted and flip up to remove out of the way to enable safe transfers in and out of the power wheelchair.  Ms. Dickenson need for a [employer] HD heavy duty power wheelchair is a life long need to allow her to safely move within her home and complete her mobility-related ADLs with the highest level of independence. She is at a significantly high risk for falling and has already suffered multiple falls resulting in traumatic injuries. Her risk for further grave injuries will be significantly decreased by the use of a [employer] HD  power wheelchair.    OBJECTIVE IMPAIRMENTS cardiopulmonary status limiting activity, decreased activity tolerance, decreased balance, decreased coordination, decreased endurance, decreased knowledge of condition, decreased knowledge of use of DME, decreased mobility, difficulty walking, decreased ROM, decreased strength, impaired UE functional use, postural dysfunction, obesity, and pain.   ACTIVITY LIMITATIONS carrying, lifting, bending, standing, squatting, stairs, transfers, bathing, toileting, reach over head, locomotion level, and caring for others  PARTICIPATION LIMITATIONS: meal prep, cleaning, laundry, driving, shopping, community activity, and yard work  PERSONAL FACTORS Education, Past/current experiences, Social background, and 3+ comorbidities: DM II, HTN, COPD on 3L O2 at night and PRN, previous thoracic spinal fx  are also affecting patient's functional outcome.   REHAB POTENTIAL: Good  CLINICAL DECISION MAKING: Evolving/moderate complexity  EVALUATION COMPLEXITY: High              Standard group 2 heavy duty                      GOALS: One time visit. No goals established.    PLAN: PT FREQUENCY: one time visit    Debbora Dus, PT Debbora Dus, PT, DPT, CBIS  02/28/2022, 12:17 PM    I concur with the above findings and recommendations of the therapist:  Physician name printed:         Physician's signature:      Date:

## 2022-02-28 NOTE — H&P (Signed)
Orthopaedic Trauma Service (OTS) Consult   Patient ID: Aimee Phillips MRN: [id number] DOB/AGE: 11-14-1970 50 y.o.  Reason for Surgery:Left proximal humerus fracture  HPI: Aimee Phillips is an 51 y.o. female with PMH significant for DM, HTN, COPD, GERD, chronic back pain on Percocet 10-325 mg presenting for surgery on left upper extremity.  Patient slipped in the bathroom on 02/25/2022, landing on her left arm and left knee.  Had immediate pain in the left shoulder region was unable to move the arm.  Was initially seen in urgent care and due to concern for fracture she was referred to the emergency department.  Imaging in the ED today revealed complex left proximal humerus fracture.  Due to complex nature of the injury, the orthopedist and [facility] felt this would best be managed by an orthopedic traumatologist.  Patient subsequently referred to OTS.  Seen in OTS clinic on 02/27/2022.  Pain has been significant.  She has been in a long-arm splint and sling since time of injury.  Notes some intermittent tingling through her fingers but no significant numbness to the extremity.  No previous injury or surgery to the left upper extremity.  She is left-hand dominant.  Ambulates at baseline with no assistive device.  Takes Percocet 10 325 mg as needed for chronic low back pain.  Is followed by [facility] for pain management.  History of type 2 diabetes (last Hgb A1c 7.6 on 01/16/2022).  Past Medical History:  Diagnosis Date   Asthma    Bipolar affect, depressed (HCC)    Cellulitis    COPD (chronic obstructive pulmonary disease) (HCC)    Depression    Diabetes mellitus without complication (HCC)    PE (pulmonary embolism)    Pulmonary edema     Past Surgical History:  Procedure Laterality Date   CESAREAN SECTION     CHOLECYSTECTOMY     ESOPHAGOGASTRODUODENOSCOPY (EGD) WITH PROPOFOL N/A 10/29/2020   Procedure: ESOPHAGOGASTRODUODENOSCOPY (EGD) WITH PROPOFOL;  Surgeon: Dolores Frame, MD;  Location: [facility];  Service: Gastroenterology;  Laterality: N/A;  12:30    Family History  Problem Relation Age of Onset   Cancer Mother        breast   Heart failure Father        died of AMI   Cancer Sister    Diabetes Brother    Heart disease Sister     Social History:  reports that she has never smoked. She has never used smokeless tobacco. She reports that she does not drink alcohol and does not use drugs.  Allergies: No Known Allergies  Medications: I have reviewed the patient's current medications. Prior to Admission:  No medications prior to admission.    ROS: Constitutional: No fever or chills Vision: No changes in vision ENT: No difficulty swallowing CV: No chest pain Pulm: No SOB or wheezing GI: No nausea or vomiting GU: No urgency or inability to hold urine Skin: + bruising L upper arm Neurologic: No numbness or tingling Psychiatric: No depression or anxiety Heme: No bruising Allergic: No reaction to medications or food   Exam: There were no vitals taken for this visit. General: NAD Orientation: Alert and oriented x3 Mood and Affect: Mood and affect appropriate.  Pleasant and cooperative Gait: Within normal limits Coordination and balance: Within normal limits   LUE: Long arm splint in place, not removed for exam. Bruising of the upper arm and shoulder. Did not attempt shoulder motion due to known fracture. Able  to wiggle fingers. Endorses sensation through median, ulnar, radial nerve distribution. Motor function intact. Hand warm and well perfused.  RUE: Skin without lesions. No tenderness to palpation. Full painless ROM, full strength in each muscle group without evidence of instability. Motor and sensory function intact.    Medical Decision Making: Data: Imaging: [facility] and lateral view of the left humerus shows significantly displaced fracture of the proximal humeral shaft with involvement of the tuberosity  Labs: No  results found for this or any previous visit (from the past 168 hour(s)).   Assessment/Plan: 51 year old female with acute left proximal humerus fracture.  Patient with significant injury to left upper extremity which require surgical intervention.  Recommend proceeding with open reduction internal fixation of left proximal humerus fracture.  Risk and benefits of procedure were discussed with the patient and her son. Risks discussed included bleeding, infection, malunion, nonunion, damage to surrounding nerves and blood vessels, pain, hardware prominence or irritation, hardware failure, shoulder stiffness, DVT/PE, and anesthesia complications.  Patient states understanding of these risks.  She agrees to proceed with surgery.  Consent will be obtained.  Thompson Caul PA-C [facility] [phone] (office) [email or web address]

## 2022-03-01 ENCOUNTER — Encounter (HOSPITAL_COMMUNITY): Payer: Self-pay | Admitting: Student

## 2022-03-01 ENCOUNTER — Other Ambulatory Visit: Payer: Self-pay

## 2022-03-01 NOTE — Anesthesia Preprocedure Evaluation (Addendum)
Anesthesia Evaluation  Patient identified by MRN, date of birth, ID band Patient awake    Reviewed: Allergy & Precautions, NPO status , Patient's Chart, lab work & pertinent test results  History of Anesthesia Complications Negative for: history of anesthetic complications  Airway Mallampati: III  TM Distance: >3 FB Neck ROM: Full    Dental  (+) Poor Dentition, Loose, Dental Advisory Given,    Pulmonary asthma , sleep apnea and Oxygen sleep apnea , COPD, PE   Pulmonary exam normal        Cardiovascular hypertension, Normal cardiovascular exam     Neuro/Psych Depression Bipolar Disorder negative neurological ROS     GI/Hepatic negative GI ROS, Neg liver ROS,   Endo/Other  diabetesMorbid obesity  Renal/GU negative Renal ROS  negative genitourinary   Musculoskeletal negative musculoskeletal ROS (+)   Abdominal   Peds  Hematology negative hematology ROS (+)   Anesthesia Other Findings   Reproductive/Obstetrics                           Anesthesia Physical Anesthesia Plan  ASA: 3  Anesthesia Plan: General   Post-op Pain Management: Regional block*, Tylenol PO (pre-op)* and Toradol IV (intra-op)*   Induction: Intravenous  PONV Risk Score and Plan: 3 and Ondansetron, Dexamethasone, Treatment may vary due to age or medical condition and Midazolam  Airway Management Planned: Oral ETT  Additional Equipment: None  Intra-op Plan:   Post-operative Plan: Extubation in OR  Informed Consent: I have reviewed the patients History and Physical, chart, labs and discussed the procedure including the risks, benefits and alternatives for the proposed anesthesia with the patient or authorized representative who has indicated his/her understanding and acceptance.     Dental advisory given  Plan Discussed with:   Anesthesia Plan Comments:        Anesthesia Quick Evaluation

## 2022-03-01 NOTE — Progress Notes (Signed)
PCP - Dr. Nadine Counts  Cardiologist - Denies  EP- Denies  Endocrine- Denies  Pulm- Denies  Chest x-ray - Denies  EKG - 03/02/22- Day of surgery  Stress Test - Denies  ECHO - Denies  Cardiac Cath - Denies  AICD-na PM-na LOOP-na  Nerve Stimulator- Denies  Dialysis- Denies  Sleep Study - Pt currently have testing machine at her home to test after surgery. CPAP - Denies  LABS- 03/02/22: CBC w/D, BMP  ASA- Denies  ERAS- Yes- clears until 0430  HA1C- 01/16/22(E): 7.6 Fasting Blood Sugar - 125-180 Checks Blood Sugar ___4__ times a week  Anesthesia- No  Pt denies having chest pain, sob, or fever during the pre-op phone call. All instructions explained to the pt, with a verbal understanding of the material including: as of today,  stop taking all Aspirin (unless instructed by your doctor) and Other Aspirin containing products, Vitamins, Fish oils, and Herbal medications. Also stop all NSAIDS i.e. Advil, Ibuprofen, Motrin, Aleve, Anaprox, Naproxen, BC, Goody Powders, and all Supplements.  Pt also instructed to wear a mask and social distance if she goes out. The opportunity to ask questions was provided.

## 2022-03-02 ENCOUNTER — Encounter (HOSPITAL_COMMUNITY): Payer: Self-pay | Admitting: Student

## 2022-03-02 ENCOUNTER — Other Ambulatory Visit: Payer: Self-pay

## 2022-03-02 ENCOUNTER — Ambulatory Visit (HOSPITAL_COMMUNITY)
Admission: RE | Admit: 2022-03-02 | Discharge: 2022-03-02 | Disposition: A | Discharge status: Home / Self Care | Payer: Medicare Other | Attending: Student | Admitting: Student

## 2022-03-02 ENCOUNTER — Ambulatory Visit (HOSPITAL_COMMUNITY): Payer: Medicare Other

## 2022-03-02 ENCOUNTER — Ambulatory Visit (HOSPITAL_BASED_OUTPATIENT_CLINIC_OR_DEPARTMENT_OTHER): Payer: Medicare Other | Admitting: Anesthesiology

## 2022-03-02 ENCOUNTER — Encounter (HOSPITAL_COMMUNITY)
Admission: RE | Disposition: A | Discharge status: Home / Self Care | Payer: Self-pay | Source: Home / Self Care | Attending: Student

## 2022-03-02 ENCOUNTER — Ambulatory Visit (HOSPITAL_COMMUNITY): Payer: Medicare Other | Admitting: Anesthesiology

## 2022-03-02 DIAGNOSIS — E119 Type 2 diabetes mellitus without complications: Secondary | ICD-10-CM | POA: Diagnosis not present

## 2022-03-02 DIAGNOSIS — G8918 Other acute postprocedural pain: Secondary | ICD-10-CM | POA: Diagnosis not present

## 2022-03-02 DIAGNOSIS — M549 Dorsalgia, unspecified: Secondary | ICD-10-CM | POA: Insufficient documentation

## 2022-03-02 DIAGNOSIS — Z86711 Personal history of pulmonary embolism: Secondary | ICD-10-CM | POA: Insufficient documentation

## 2022-03-02 DIAGNOSIS — I10 Essential (primary) hypertension: Secondary | ICD-10-CM

## 2022-03-02 DIAGNOSIS — W010XXA Fall on same level from slipping, tripping and stumbling without subsequent striking against object, initial encounter: Secondary | ICD-10-CM | POA: Diagnosis not present

## 2022-03-02 DIAGNOSIS — Z79891 Long term (current) use of opiate analgesic: Secondary | ICD-10-CM | POA: Insufficient documentation

## 2022-03-02 DIAGNOSIS — S42302A Unspecified fracture of shaft of humerus, left arm, initial encounter for closed fracture: Secondary | ICD-10-CM | POA: Insufficient documentation

## 2022-03-02 DIAGNOSIS — Z6841 Body Mass Index (BMI) 40.0 and over, adult: Secondary | ICD-10-CM | POA: Diagnosis not present

## 2022-03-02 DIAGNOSIS — G4730 Sleep apnea, unspecified: Secondary | ICD-10-CM | POA: Diagnosis not present

## 2022-03-02 DIAGNOSIS — S42202A Unspecified fracture of upper end of left humerus, initial encounter for closed fracture: Secondary | ICD-10-CM | POA: Insufficient documentation

## 2022-03-02 DIAGNOSIS — G8929 Other chronic pain: Secondary | ICD-10-CM | POA: Diagnosis not present

## 2022-03-02 DIAGNOSIS — I152 Hypertension secondary to endocrine disorders: Secondary | ICD-10-CM

## 2022-03-02 DIAGNOSIS — E1165 Type 2 diabetes mellitus with hyperglycemia: Secondary | ICD-10-CM

## 2022-03-02 DIAGNOSIS — S42252A Displaced fracture of greater tuberosity of left humerus, initial encounter for closed fracture: Secondary | ICD-10-CM | POA: Diagnosis not present

## 2022-03-02 HISTORY — DX: Nausea with vomiting, unspecified: R11.2

## 2022-03-02 HISTORY — DX: Other specified postprocedural states: Z98.890

## 2022-03-02 HISTORY — PX: ORIF HUMERUS FRACTURE: SHX2126

## 2022-03-02 LAB — BASIC METABOLIC PANEL
Anion gap: 10 (ref 5–15)
BUN: 10 mg/dL (ref 6–20)
CO2: 25 mmol/L (ref 22–32)
Calcium: 8.5 mg/dL — ABNORMAL LOW (ref 8.9–10.3)
Chloride: 100 mmol/L (ref 98–111)
Creatinine, Ser: 0.71 mg/dL (ref 0.44–1.00)
GFR, Estimated: >60 mL/min (ref >60)
Glucose, Bld: 160 mg/dL — ABNORMAL HIGH (ref 70–99)
Potassium: 3.5 mmol/L (ref 3.5–5.1)
Sodium: 135 mmol/L (ref 135–145)

## 2022-03-02 LAB — CBC WITH DIFFERENTIAL/PLATELET
Abs Immature Granulocytes: 0.04 10*3/uL (ref 0.00–0.07)
Basophils Absolute: 0.1 10*3/uL (ref 0.0–0.1)
Basophils Relative: 1 %
Eosinophils Absolute: 0.1 10*3/uL (ref 0.0–0.5)
Eosinophils Relative: 1 %
HCT: 37.3 % (ref 36.0–46.0)
Hemoglobin: 12.3 g/dL (ref 12.0–15.0)
Immature Granulocytes: 0 %
Lymphocytes Relative: 30 %
Lymphs Abs: 2.9 10*3/uL (ref 0.7–4.0)
MCH: 31.3 pg (ref 26.0–34.0)
MCHC: 33 g/dL (ref 30.0–36.0)
MCV: 94.9 fL (ref 80.0–100.0)
Monocytes Absolute: 0.8 10*3/uL (ref 0.1–1.0)
Monocytes Relative: 8 %
Neutro Abs: 5.9 10*3/uL (ref 1.7–7.7)
Neutrophils Relative %: 60 %
Platelets: 263 10*3/uL (ref 150–400)
RBC: 3.93 MIL/uL (ref 3.87–5.11)
RDW: 13.1 % (ref 11.5–15.5)
WBC: 9.8 10*3/uL (ref 4.0–10.5)
nRBC: 0 % (ref 0.0–0.2)

## 2022-03-02 LAB — GLUCOSE, CAPILLARY
Glucose-Capillary: 162 mg/dL — ABNORMAL HIGH (ref 70–99)
Glucose-Capillary: 158 mg/dL — ABNORMAL HIGH (ref 70–99)

## 2022-03-02 SURGERY — OPEN REDUCTION INTERNAL FIXATION (ORIF) PROXIMAL HUMERUS FRACTURE
Anesthesia: General | Site: Arm Upper | Laterality: Left

## 2022-03-02 MED ORDER — AMISULPRIDE (ANTIEMETIC) 5 MG/2ML IV SOLN: 10.0000 mg | Freq: Once | INTRAVENOUS | Status: DC | PRN

## 2022-03-02 MED ORDER — LIDOCAINE HCL URETHRAL/MUCOSAL 2 % EX GEL
CUTANEOUS | Status: AC
  Filled 2022-03-02: qty 11

## 2022-03-02 MED ORDER — CHLORHEXIDINE GLUCONATE 0.12 % MT SOLN
15.0000 mL | Freq: Once | OROMUCOSAL | Status: AC
  Administered 2022-03-02: 15 mL via OROMUCOSAL
  Filled 2022-03-02: qty 15

## 2022-03-02 MED ORDER — CEFAZOLIN IN SODIUM CHLORIDE 3-0.9 GM/100ML-% IV SOLN
3.0000 g | INTRAVENOUS | Status: AC
  Administered 2022-03-02: 3 g via INTRAVENOUS
  Filled 2022-03-02: qty 100

## 2022-03-02 MED ORDER — FENTANYL CITRATE (PF) 100 MCG/2ML IJ SOLN
INTRAMUSCULAR | Status: DC | PRN
  Administered 2022-03-02: 25 ug via INTRAVENOUS
  Administered 2022-03-02: 50 ug via INTRAVENOUS
  Administered 2022-03-02: 25 ug via INTRAVENOUS

## 2022-03-02 MED ORDER — LIDOCAINE 2% (20 MG/ML) 5 ML SYRINGE
INTRAMUSCULAR | Status: DC | PRN
  Administered 2022-03-02: 40 mg via INTRAVENOUS

## 2022-03-02 MED ORDER — ROPIVACAINE HCL 5 MG/ML IJ SOLN
INTRAMUSCULAR | Status: DC | PRN
  Administered 2022-03-02: 30 mL via PERINEURAL

## 2022-03-02 MED ORDER — PROPOFOL 10 MG/ML IV BOLUS
INTRAVENOUS | Status: AC
  Filled 2022-03-02: qty 20

## 2022-03-02 MED ORDER — PHENYLEPHRINE HCL (PRESSORS) 10 MG/ML IV SOLN
INTRAVENOUS | Status: AC
  Filled 2022-03-02: qty 1

## 2022-03-02 MED ORDER — VANCOMYCIN HCL 1000 MG IV SOLR
INTRAVENOUS | Status: DC | PRN
  Administered 2022-03-02: 1000 mg

## 2022-03-02 MED ORDER — LACTATED RINGERS IV SOLN: INTRAVENOUS | Status: DC

## 2022-03-02 MED ORDER — ROCURONIUM BROMIDE 10 MG/ML (PF) SYRINGE
PREFILLED_SYRINGE | INTRAVENOUS | Status: DC | PRN
  Administered 2022-03-02 (×2): 20 mg via INTRAVENOUS
  Administered 2022-03-02: 60 mg via INTRAVENOUS

## 2022-03-02 MED ORDER — PHENYLEPHRINE HCL-NACL 20-0.9 MG/250ML-% IV SOLN
INTRAVENOUS | Status: DC | PRN
  Administered 2022-03-02: 20 ug/min via INTRAVENOUS

## 2022-03-02 MED ORDER — MIDAZOLAM HCL 5 MG/5ML IJ SOLN
INTRAMUSCULAR | Status: DC | PRN
  Administered 2022-03-02: 1 mg via INTRAVENOUS

## 2022-03-02 MED ORDER — SUGAMMADEX SODIUM 200 MG/2ML IV SOLN
INTRAVENOUS | Status: DC | PRN
  Administered 2022-03-02: 300 mg via INTRAVENOUS

## 2022-03-02 MED ORDER — OXYCODONE HCL 5 MG/5ML PO SOLN: 5.0000 mg | Freq: Once | ORAL | Status: DC | PRN

## 2022-03-02 MED ORDER — ORAL CARE MOUTH RINSE: 15.0000 mL | Freq: Once | OROMUCOSAL | Status: AC

## 2022-03-02 MED ORDER — CLONIDINE HCL (ANALGESIA) 100 MCG/ML EP SOLN
EPIDURAL | Status: DC | PRN
  Administered 2022-03-02: 50 ug

## 2022-03-02 MED ORDER — OXYCODONE HCL 5 MG PO TABS: 5.0000 mg | ORAL_TABLET | Freq: Once | ORAL | Status: DC | PRN

## 2022-03-02 MED ORDER — ACETAMINOPHEN 500 MG PO TABS
1000.0000 mg | ORAL_TABLET | Freq: Once | ORAL | Status: AC
  Administered 2022-03-02: 1000 mg via ORAL
  Filled 2022-03-02: qty 2

## 2022-03-02 MED ORDER — FENTANYL CITRATE (PF) 100 MCG/2ML IJ SOLN: 25.0000 ug | INTRAMUSCULAR | Status: DC | PRN

## 2022-03-02 MED ORDER — 0.9 % SODIUM CHLORIDE (POUR BTL) OPTIME
TOPICAL | Status: DC | PRN
  Administered 2022-03-02: 1000 mL

## 2022-03-02 MED ORDER — ONDANSETRON HCL 4 MG/2ML IJ SOLN
INTRAMUSCULAR | Status: DC | PRN
  Administered 2022-03-02: 4 mg via INTRAVENOUS

## 2022-03-02 MED ORDER — PROPOFOL 10 MG/ML IV BOLUS
INTRAVENOUS | Status: DC | PRN
  Administered 2022-03-02: 20 mg via INTRAVENOUS
  Administered 2022-03-02: 140 mg via INTRAVENOUS

## 2022-03-02 MED ORDER — DEXAMETHASONE SODIUM PHOSPHATE 10 MG/ML IJ SOLN
INTRAMUSCULAR | Status: DC | PRN
  Administered 2022-03-02: 5 mg via INTRAVENOUS

## 2022-03-02 MED ORDER — INSULIN ASPART 100 UNIT/ML IJ SOLN
0.0000 [IU] | INTRAMUSCULAR | Status: DC | PRN
  Administered 2022-03-02: 2 [IU] via SUBCUTANEOUS
  Filled 2022-03-02: qty 1

## 2022-03-02 MED ORDER — ONDANSETRON HCL 4 MG/2ML IJ SOLN: 4.0000 mg | Freq: Once | INTRAMUSCULAR | Status: DC | PRN

## 2022-03-02 MED ORDER — FENTANYL CITRATE (PF) 100 MCG/2ML IJ SOLN
INTRAMUSCULAR | Status: AC
  Filled 2022-03-02: qty 2

## 2022-03-02 MED ORDER — PHENYLEPHRINE 80 MCG/ML (10ML) SYRINGE FOR IV PUSH (FOR BLOOD PRESSURE SUPPORT)
PREFILLED_SYRINGE | INTRAVENOUS | Status: DC | PRN
  Administered 2022-03-02: 40 ug via INTRAVENOUS

## 2022-03-02 MED ORDER — PROPOFOL 10 MG/ML IV BOLUS
INTRAVENOUS | Status: AC
Start: 2022-03-02 — End: ?
  Filled 2022-03-02: qty 20

## 2022-03-02 MED ORDER — VANCOMYCIN HCL 1000 MG IV SOLR
INTRAVENOUS | Status: AC
  Filled 2022-03-02: qty 20

## 2022-03-02 MED ORDER — MIDAZOLAM HCL 2 MG/2ML IJ SOLN
INTRAMUSCULAR | Status: AC
  Filled 2022-03-02: qty 2

## 2022-03-02 MED ORDER — SUCCINYLCHOLINE CHLORIDE 200 MG/10ML IV SOSY
PREFILLED_SYRINGE | INTRAVENOUS | Status: DC | PRN
  Administered 2022-03-02: 140 mg via INTRAVENOUS

## 2022-03-02 SURGICAL SUPPLY — 67 items
BAG COUNTER SPONGE SURGICOUNT (BAG) ×1 IMPLANT
BIT DRILL QC 2.5MM SHRT EVO SM (DRILL) IMPLANT
BNDG ELASTIC 6X5.8 VLCR STR LF (GAUZE/BANDAGES/DRESSINGS) ×2 IMPLANT
BRUSH SCRUB EZ PLAIN DRY (MISCELLANEOUS) ×3 IMPLANT
CHLORAPREP W/TINT 26 (MISCELLANEOUS) ×3 IMPLANT
COVER SURGICAL LIGHT HANDLE (MISCELLANEOUS) ×3 IMPLANT
DERMABOND ADVANCED (GAUZE/BANDAGES/DRESSINGS) ×1
DERMABOND ADVANCED .7 DNX12 (GAUZE/BANDAGES/DRESSINGS) IMPLANT
DRAPE C-ARM 42X72 X-RAY (DRAPES) ×2 IMPLANT
DRAPE HALF SHEET 40X57 (DRAPES) ×3 IMPLANT
DRAPE SURG 17X23 STRL (DRAPES) ×4 IMPLANT
DRAPE U-SHAPE 47X51 STRL (DRAPES) ×4 IMPLANT
DRAPE U-SHAPE 48X52 POLY STRL (PACKS) ×2 IMPLANT
DRESSING MEPILEX FLEX 4X4 (GAUZE/BANDAGES/DRESSINGS) IMPLANT
DRILL QC 2.5MM SHORT EVOS SM (DRILL) ×2
DRSG MEPILEX BORDER 4X12 (GAUZE/BANDAGES/DRESSINGS) ×1 IMPLANT
DRSG MEPILEX BORDER 4X8 (GAUZE/BANDAGES/DRESSINGS) ×2 IMPLANT
DRSG MEPILEX FLEX 4X4 (GAUZE/BANDAGES/DRESSINGS) ×2
ELECT REM PT RETURN 9FT ADLT (ELECTROSURGICAL) ×2
ELECTRODE REM PT RTRN 9FT ADLT (ELECTROSURGICAL) ×1 IMPLANT
GLOVE BIO SURGEON STRL SZ 6.5 (GLOVE) ×6 IMPLANT
GLOVE BIO SURGEON STRL SZ7.5 (GLOVE) ×7 IMPLANT
GLOVE BIOGEL PI IND STRL 6.5 (GLOVE) ×1 IMPLANT
GLOVE BIOGEL PI IND STRL 7.5 (GLOVE) ×1 IMPLANT
GLOVE BIOGEL PI INDICATOR 6.5 (GLOVE) ×1
GLOVE BIOGEL PI INDICATOR 7.5 (GLOVE) ×1
GLOVE SURG PR MICRO ENCORE 7.5 (GLOVE) ×2 IMPLANT
GOWN STRL REUS W/ TWL LRG LVL3 (GOWN DISPOSABLE) ×2 IMPLANT
GOWN STRL REUS W/TWL LRG LVL3 (GOWN DISPOSABLE) ×4
K-WIRE 1.6 (WIRE) ×4
K-WIRE FX150X1.6XTROC PNT (WIRE) ×2
KIT BASIN OR (CUSTOM PROCEDURE TRAY) ×2 IMPLANT
KIT TURNOVER KIT B (KITS) ×2 IMPLANT
KWIRE FX150X1.6XTROC PNT (WIRE) IMPLANT
MANIFOLD NEPTUNE II (INSTRUMENTS) ×2 IMPLANT
MAT PREVALON FULL STRYKER (MISCELLANEOUS) ×1 IMPLANT
NS IRRIG 1000ML POUR BTL (IV SOLUTION) ×2 IMPLANT
PACK SHOULDER (CUSTOM PROCEDURE TRAY) ×2 IMPLANT
PAD ARMBOARD 7.5X6 YLW CONV (MISCELLANEOUS) ×4 IMPLANT
PLATE PROX HUM CRVD 3.5X12 LT (Plate) ×1 IMPLANT
SCREW CORT 3.5X20 ST EVOS (Screw) ×2 IMPLANT
SCREW CORT 3.5X22 ST EVOS (Screw) ×1 IMPLANT
SCREW CORT 3.5X30 ST EVOS (Screw) ×1 IMPLANT
SCREW CORTEX 3.5X24MM (Screw) ×1 IMPLANT
SCREW CORTEX 3.5X26 (Screw) ×4 IMPLANT
SCREW LOCK 3.5X44MM (Screw) ×2 IMPLANT
SCREW LOCK EVOS ST 3.5X34 (Screw) ×1 IMPLANT
SCREW LOCK ST EVOS 3.5X38 (Screw) ×3 IMPLANT
SCREW LOCK ST EVOS 3.5X40 (Screw) ×1 IMPLANT
SLING ARM FOAM STRAP XLG (SOFTGOODS) ×1 IMPLANT
SPONGE T-LAP 18X18 ~~LOC~~+RFID (SPONGE) ×4 IMPLANT
STAPLER VISISTAT 35W (STAPLE) ×1 IMPLANT
SUCTION FRAZIER HANDLE 10FR (MISCELLANEOUS) ×2
SUCTION TUBE FRAZIER 10FR DISP (MISCELLANEOUS) ×1 IMPLANT
SUT ETHILON 3 0 FSL (SUTURE) IMPLANT
SUT FIBERWIRE #2 38 T-5 BLUE (SUTURE)
SUT MNCRL AB 3-0 PS2 18 (SUTURE) ×2 IMPLANT
SUT VIC AB 0 CT1 27 (SUTURE) ×2
SUT VIC AB 0 CT1 27XBRD ANBCTR (SUTURE) ×2 IMPLANT
SUT VIC AB 1 CT1 18XCR BRD 8 (SUTURE) IMPLANT
SUT VIC AB 1 CT1 8-18 (SUTURE) ×2
SUT VIC AB 2-0 CT1 27 (SUTURE) ×4
SUT VIC AB 2-0 CT1 TAPERPNT 27 (SUTURE) ×2 IMPLANT
SUTURE FIBERWR #2 38 T-5 BLUE (SUTURE) ×2 IMPLANT
TOWEL GREEN STERILE (TOWEL DISPOSABLE) ×4 IMPLANT
TRAY FOLEY MTR SLVR 16FR STAT (SET/KITS/TRAYS/PACK) IMPLANT
WATER STERILE IRR 1000ML POUR (IV SOLUTION) ×2 IMPLANT

## 2022-03-02 NOTE — Op Note (Signed)
Orthopaedic Surgery Operative Note (CSN: [id number] ) Date of Surgery: 03/02/2022  Admit Date: 03/02/2022   Diagnoses: Pre-Op Diagnoses: Left proximal humerus fracture Left humeral shaft fracture  Post-Op Diagnosis: Same  Procedures: CPT 23615-Open reduction internal fixation of left proximal humerus fracture CPT 24515-Open reduction internal fixation of left humeral shaft fracture  Surgeons : Primary: Roby Lofts, MD  Assistant: Ulyses Southward, PA-C  Location: OR 3   Anesthesia: General   Antibiotics: Ancef 3gm with 1 gm vancomycin powder placed topically   Tourniquet time: None    Estimated Blood Loss: 500 mL  Complications:None  Specimens:None   Implants: Implant Name Type Inv. Item Serial No. Manufacturer Lot No. LRB No. Used Action  SCREW CORTEX 3.5X24MM - [id number] Screw SCREW CORTEX 3.5X24MM  [employer]  Left 1 Implanted  SCREW CORTEX 3.5X26 - [id number] Screw SCREW CORTEX 3.5X26  [employer]  Left 4 Implanted  SCREW CORT 3.5X22 ST [employer] - [id number] Screw SCREW CORT 3.5X22 ST [employer]OS  [employer]  Left 1 Implanted  SCREW CORT 3.5X20 ST EVOS - [id number] Screw SCREW CORT 3.5X20 ST [employer]OS  [employer]  Left 2 Implanted  PLATE PROX HUM CRVD 3.5X12 LT - [id number] Plate PLATE PROX HUM CRVD 3.5X12 LT  [employer]  Left 1 Implanted  SCREW CORT 3.5X30 ST [employer]OS - [id number] Screw SCREW CORT 3.5X30 ST [employer]OS  [employer]  Left 1 Implanted  SCREW LOCK 3.5X44MM - [id number] Screw SCREW LOCK 3.5X44MM  [employer]  Left 2 Implanted  SCREW LOCK ST EVOS 3.5X38 - [id number] Screw SCREW LOCK ST EVOS 3.5X38  [employer]  Left 3 Implanted  SCREW LOCK EVOS ST 3.5X34 - [id number] Screw SCREW LOCK EVOS ST 3.5X34  [employer]  Left 1 Implanted  SCREW LOCK ST EVOS 3.5X40 - [id number] Screw SCREW LOCK ST EVOS 3.5X40  [employer]  Left 1  Implanted     Indications for Surgery: 51 year old female who sustained a left proximal humerus fracture with humeral shaft extension.  Due to the unstable nature of her injury I recommend proceeding with open reduction internal fixation.  Risk and benefits were discussed with the patient and her family.  Risks included but not limited to bleeding, infection, malunion, nonunion, hardware failure, hardware irritation, nerve or blood vessel injury, DVT, even the possibility of anesthetic complications.  They agreed to proceed with surgery and consent was obtained.  Operative Findings: Open reduction internal fixation of left proximal humerus fracture with humeral shaft extension using [employer] EVOS extra-articular proximal humerus plate with independent 3.5 mm positional screws.  Procedure: The patient was identified in the preoperative holding area. Consent was confirmed with the patient and their family and all questions were answered. The operative extremity was marked after confirmation with the patient. she was then brought back to the operating room by our anesthesia colleagues.  She was carefully transferred over to radiolucent flat top table.  She was placed under general anesthetic the left upper extremity was prepped and draped in usual sterile fashion.  A timeout was performed to verify the patient, the procedure, and the extremity.  Preoperative antibiotics were dosed.  A standard deltopectoral approach was made and carried down through skin and subcutaneous tissue.  Identified the cephalic vein and mobilized this medially.  I then developed the interval between the deltoid and the pectoralis major.  I extended this down distally  into the biceps fascia incised through the fascia and mobilized the biceps muscle belly medially and then carefully dissected through the brachialis down to the humeral shaft.  I performed a slight release of the deltoid insertion to be able to access and clamp  the fracture.  There was a large split down the center of the humeral shaft that extended up into the greater tuberosity.  I cleaned out the hematoma using a Cobb elevator and a ronguer and exposed the fracture ends to allow for visualization and reduction.  Using a reduction tenaculum I was able to anatomically reduce the fracture.  I had two clamps placed to hold the reduction and then I attempted to place independent positional screws.  For started out by trying to place it medial to lateral.  I placed 2 positional screws and tried to remove my clamps but unfortunately the fracture displaced and I had the screws within the posterior fracture plane.  I read reduce the fracture and then placed from lateral to medial 3 independent positional screws.  Excellent fixation was obtained.  I was able to remove my clamps and then I carefully positioned a [employer] extra-articular proximal humeral plate and placed this on the bone and held it provisionally with 1.6 mm K wire.  I confirmed positioning of the plate and then placed a nonlocking screw into the humeral shaft.  A nonlocking screw was placed in the proximal humeral shaft and just below the calcar.  I confirmed positioning and placement and then proceeded to place a total of 4 nonlocking screws distally and locking screws into the calcar and humeral head to complete the construct.  Final fluoroscopic imaging was obtained.  The incisions were copiously irrigated.  A gram of vancomycin powder was placed into the incision.  Layered closure of 0 Vicryl for the deep fascia and subcutaneous fat layer and 2-0 Vicryl and 3-0 Monocryl with Dermabond for the skin.  Sterile dressing was applied.  The patient was placed in a sling and awoken from anesthesia and taken to the PACU in stable condition.  Post Op Plan/Instructions: The patient will be nonweightbearing to the left upper extremity.  She will receive postoperative Ancef.  No DVT prophylaxis is needed  in this ambulatory upper extremity patient.  We will plan for discharge home unless there is an issue in the PACU.  She will follow-up with me in approximately 2 weeks for x-rays and wound check.  I was present and performed the entire surgery.  Ulyses Southward, PA-C did assist me throughout the case. An assistant was necessary given the difficulty in approach, maintenance of reduction and ability to instrument the fracture.   Truitt Merle, MD [facility]

## 2022-03-02 NOTE — Interval H&P Note (Signed)
History and Physical Interval Note:  03/02/2022 7:24 AM  Aimee Phillips  has presented today for surgery, with the diagnosis of left proximal humerus fracture.  The various methods of treatment have been discussed with the patient and family. After consideration of risks, benefits and other options for treatment, the patient has consented to  Procedure(s): OPEN REDUCTION INTERNAL FIXATION (ORIF) PROXIMAL HUMERUS FRACTURE (Left) as a surgical intervention.  The patient's history has been reviewed, patient examined, no change in status, stable for surgery.  I have reviewed the patient's chart and labs.  Questions were answered to the patient's satisfaction.     Caryn Bee P Akiyah Eppolito

## 2022-03-02 NOTE — Transfer of Care (Signed)
Immediate Anesthesia Transfer of Care Note  Patient: Aimee Phillips  Procedure(s) Performed: OPEN REDUCTION INTERNAL FIXATION (ORIF) PROXIMAL HUMERUS FRACTURE (Left: Arm Upper)  Patient Location: PACU  Anesthesia Type:General and Regional  Level of Consciousness: drowsy and patient cooperative  Airway & Oxygen Therapy: Patient Spontanous Breathing and Patient connected to nasal cannula oxygen  Post-op Assessment: Report given to RN and Post -op Vital signs reviewed and stable  Post vital signs: Reviewed and stable  Last Vitals:  Vitals Value Taken Time  BP 131/73 03/02/22 1009  Temp 36.3 C 03/02/22 1009  Pulse 101 03/02/22 1019  Resp 16 03/02/22 1019  SpO2 93 % 03/02/22 1019  Vitals shown include unvalidated device data.  Last Pain:  Vitals:   03/02/22 1009  TempSrc:   PainSc: 0-No pain      Patients Stated Pain Goal: 3 (03/02/22 7425)  Complications: No notable events documented.

## 2022-03-02 NOTE — Anesthesia Procedure Notes (Signed)
Procedure Name: Intubation Date/Time: 03/02/2022 7:51 AM  Performed by: Marny Lowenstein, CRNAPre-anesthesia Checklist: Patient identified, Emergency Drugs available, Suction available and Patient being monitored Patient Re-evaluated:Patient Re-evaluated prior to induction Oxygen Delivery Method: Circle system utilized Preoxygenation: Pre-oxygenation with 100% oxygen Induction Type: IV induction Ventilation: Mask ventilation without difficulty Laryngoscope Size: Glidescope and 3 Grade View: Grade I Tube type: Oral Tube size: 7.5 mm Number of attempts: 1 Airway Equipment and Method: Patient positioned with wedge pillow and Rigid stylet Placement Confirmation: ETT inserted through vocal cords under direct vision, positive ETCO2 and breath sounds checked- equal and bilateral Secured at: 21 cm Tube secured with: Tape Dental Injury: Teeth and Oropharynx as per pre-operative assessment

## 2022-03-02 NOTE — Discharge Instructions (Addendum)
[facility] Discharge Instructions   General Discharge Instructions  WEIGHT BEARING STATUS:Non-weightbearing left upper extremity  RANGE OF MOTION/ACTIVITY: Ok for shoulder and elbow motion as tolerated. Use sling as needed for comfort  Wound Care:You may remove your surgical dressing on post-op day #2 (Saturday 03/04/22). Incisions are closed with dissolvable suture and skin glue (Dermabond). Incisions can be left open to air if there is no drainage. If incision continues to have drainage, follow wound care instructions below. Okay to shower if no drainage from incisions.  DVT/PE prophylaxis: None  Diet: as you were eating previously.  Can use over the counter stool softeners and bowel preparations, such as Miralax, to help with bowel movements.  Narcotics can be constipating.  Be sure to drink plenty of fluids  PAIN MEDICATION USE AND EXPECTATIONS  You have likely been given narcotic medications to help control your pain.  After a traumatic event that results in an fracture (broken bone) with or without surgery, it is ok to use narcotic pain medications to help control one's pain.  We understand that everyone responds to pain differently and each individual patient will be evaluated on a regular basis for the continued need for narcotic medications. Ideally, narcotic medication use should last no more than 6-8 weeks (coinciding with fracture healing).   As a patient it is your responsibility as well to monitor narcotic medication use and report the amount and frequency you use these medications when you come to your office visit.   We would also advise that if you are using narcotic medications, you should take a dose prior to therapy to maximize you participation.  IF YOU ARE ON NARCOTIC MEDICATIONS IT IS NOT PERMISSIBLE TO OPERATE A MOTOR VEHICLE (MOTORCYCLE/CAR/TRUCK/MOPED) OR HEAVY MACHINERY DO NOT MIX NARCOTICS WITH OTHER CNS (CENTRAL NERVOUS SYSTEM) DEPRESSANTS SUCH AS  ALCOHOL   STOP SMOKING OR USING NICOTINE PRODUCTS!!!!  As discussed nicotine severely impairs your body's ability to heal surgical and traumatic wounds but also impairs bone healing.  Wounds and bone heal by forming microscopic blood vessels (angiogenesis) and nicotine is a vasoconstrictor (essentially, shrinks blood vessels).  Therefore, if vasoconstriction occurs to these microscopic blood vessels they essentially disappear and are unable to deliver necessary nutrients to the healing tissue.  This is one modifiable factor that you can do to dramatically increase your chances of healing your injury.    (This means no smoking, no nicotine gum, patches, etc)  DO NOT USE NONSTEROIDAL ANTI-INFLAMMATORY DRUGS (NSAID'S)  Using products such as Advil (ibuprofen), Aleve (naproxen), Motrin (ibuprofen) for additional pain control during fracture healing can delay and/or prevent the healing response.  If you would like to take over the counter (OTC) medication, Tylenol (acetaminophen) is ok.  However, some narcotic medications that are given for pain control contain acetaminophen as well. Therefore, you should not exceed more than 4000 mg of tylenol in a day if you do not have liver disease.  Also note that there are may OTC medicines, such as cold medicines and allergy medicines that my contain tylenol as well.  If you have any questions about medications and/or interactions please ask your doctor/PA or your pharmacist.      ICE AND ELEVATE INJURED/OPERATIVE EXTREMITY  Using ice and elevating the injured extremity above your heart can help with swelling and pain control.  Icing in a pulsatile fashion, such as 20 minutes on and 20 minutes off, can be followed.    Do not place ice directly on skin.  Make sure there is a barrier between to skin and the ice pack.    Using frozen items such as frozen peas works well as the conform nicely to the are that needs to be iced.  USE AN ACE WRAP OR TED HOSE FOR SWELLING  CONTROL  In addition to icing and elevation, Ace wraps or TED hose are used to help limit and resolve swelling.  It is recommended to use Ace wraps or TED hose until you are informed to stop.    When using Ace Wraps start the wrapping distally (farthest away from the body) and wrap proximally (closer to the body)   Example: If you had surgery on your leg or thing and you do not have a splint on, start the ace wrap at the toes and work your way up to the thigh        If you had surgery on your upper extremity and do not have a splint on, start the ace wrap at your fingers and work your way up to the upper arm    CALL THE OFFICE WITH ANY QUESTIONS OR CONCERNS: [phone]   VISIT OUR WEBSITE FOR ADDITIONAL INFORMATION: [email or web address]     Discharge Wound Care Instructions  Do NOT apply any ointments, solutions or lotions to pin sites or surgical wounds.  These prevent needed drainage and even though solutions like hydrogen peroxide kill bacteria, they also damage cells lining the pin sites that help fight infection.  Applying lotions or ointments can keep the wounds moist and can cause them to breakdown and open up as well. This can increase the risk for infection. When in doubt call the office.  If any drainage is noted, use a foam dressing - These dressing supplies should be available at local medical supply stores [facility], [facility], etc) as well as Insurance claims handler ([facility], [facility], [employer], etc)  Once the incision is completely dry and without drainage, it may be left open to air out.  Showering may begin 36-48 hours later.  Cleaning gently with soap and water.

## 2022-03-02 NOTE — Anesthesia Procedure Notes (Signed)
Anesthesia Regional Block: Supraclavicular block   Pre-Anesthetic Checklist: , timeout performed,  Correct Patient, Correct Site, Correct Laterality,  Correct Procedure, Correct Position, site marked,  Risks and benefits discussed,  Surgical consent,  Pre-op evaluation,  At surgeon's request and post-op pain management  Laterality: Left  Prep: chloraprep       Needles:  Injection technique: Single-shot  Needle Type: Echogenic Stimulator Needle     Needle Length: 10cm  Needle Gauge: 20     Additional Needles:   Procedures:,,,, ultrasound used (permanent image in chart),,    Narrative:  Start time: 03/02/2022 7:01 AM End time: 03/02/2022 7:05 AM Injection made incrementally with aspirations every 5 mL.  Performed by: Personally  Anesthesiologist: Lucretia Kern, MD  Additional Notes: Standard monitors applied. Skin prepped. Good needle visualization with ultrasound. Injection made in 5cc increments with no resistance to injection. Patient tolerated the procedure well.

## 2022-03-02 NOTE — Anesthesia Postprocedure Evaluation (Signed)
Anesthesia Post Note  Patient: Aimee Phillips  Procedure(s) Performed: OPEN REDUCTION INTERNAL FIXATION (ORIF) PROXIMAL HUMERUS FRACTURE (Left: Arm Upper)     Patient location during evaluation: PACU Anesthesia Type: General Level of consciousness: awake and alert Pain management: pain level controlled Vital Signs Assessment: post-procedure vital signs reviewed and stable Respiratory status: spontaneous breathing, nonlabored ventilation and respiratory function stable Cardiovascular status: blood pressure returned to baseline and stable Postop Assessment: no apparent nausea or vomiting Anesthetic complications: no   No notable events documented.  Last Vitals:  Vitals:   03/02/22 1054 03/02/22 1105  BP: 122/67 117/64  Pulse: 100 100  Resp: 13 18  Temp:  37.1 C  SpO2: 93% 94%    Last Pain:  Vitals:   03/02/22 1105  TempSrc:   PainSc: 0-No pain                 Lucretia Kern

## 2022-03-03 ENCOUNTER — Encounter (HOSPITAL_COMMUNITY): Payer: Self-pay | Admitting: Student

## 2022-03-05 DIAGNOSIS — J449 Chronic obstructive pulmonary disease, unspecified: Secondary | ICD-10-CM | POA: Diagnosis not present

## 2022-03-06 ENCOUNTER — Other Ambulatory Visit: Payer: Self-pay | Admitting: Family Medicine

## 2022-03-06 ENCOUNTER — Ambulatory Visit (INDEPENDENT_AMBULATORY_CARE_PROVIDER_SITE_OTHER): Payer: Medicare Other | Admitting: Family Medicine

## 2022-03-06 VITALS — Wt 316.0 lb

## 2022-03-06 DIAGNOSIS — F3132 Bipolar disorder, current episode depressed, moderate: Secondary | ICD-10-CM | POA: Diagnosis not present

## 2022-03-06 DIAGNOSIS — Z789 Other specified health status: Secondary | ICD-10-CM | POA: Diagnosis not present

## 2022-03-06 DIAGNOSIS — Z7409 Other reduced mobility: Secondary | ICD-10-CM | POA: Diagnosis not present

## 2022-03-06 DIAGNOSIS — Z993 Dependence on wheelchair: Secondary | ICD-10-CM | POA: Diagnosis not present

## 2022-03-21 DIAGNOSIS — S42302D Unspecified fracture of shaft of humerus, left arm, subsequent encounter for fracture with routine healing: Secondary | ICD-10-CM | POA: Diagnosis not present

## 2022-03-21 DIAGNOSIS — S42202D Unspecified fracture of upper end of left humerus, subsequent encounter for fracture with routine healing: Secondary | ICD-10-CM | POA: Diagnosis not present

## 2022-03-27 DIAGNOSIS — M2560 Stiffness of unspecified joint, not elsewhere classified: Secondary | ICD-10-CM | POA: Diagnosis not present

## 2022-03-27 DIAGNOSIS — S42202B Unspecified fracture of upper end of left humerus, initial encounter for open fracture: Secondary | ICD-10-CM | POA: Diagnosis not present

## 2022-03-27 DIAGNOSIS — R29898 Other symptoms and signs involving the musculoskeletal system: Secondary | ICD-10-CM | POA: Diagnosis not present

## 2022-04-03 ENCOUNTER — Other Ambulatory Visit: Payer: Self-pay | Admitting: Family Medicine

## 2022-04-03 DIAGNOSIS — S42202B Unspecified fracture of upper end of left humerus, initial encounter for open fracture: Secondary | ICD-10-CM | POA: Diagnosis not present

## 2022-04-03 DIAGNOSIS — R29898 Other symptoms and signs involving the musculoskeletal system: Secondary | ICD-10-CM | POA: Diagnosis not present

## 2022-04-03 DIAGNOSIS — M2560 Stiffness of unspecified joint, not elsewhere classified: Secondary | ICD-10-CM | POA: Diagnosis not present

## 2022-04-04 DIAGNOSIS — J449 Chronic obstructive pulmonary disease, unspecified: Secondary | ICD-10-CM | POA: Diagnosis not present

## 2022-04-05 DIAGNOSIS — R29898 Other symptoms and signs involving the musculoskeletal system: Secondary | ICD-10-CM | POA: Diagnosis not present

## 2022-04-05 DIAGNOSIS — M2560 Stiffness of unspecified joint, not elsewhere classified: Secondary | ICD-10-CM | POA: Diagnosis not present

## 2022-04-05 DIAGNOSIS — S42202B Unspecified fracture of upper end of left humerus, initial encounter for open fracture: Secondary | ICD-10-CM | POA: Diagnosis not present

## 2022-04-19 ENCOUNTER — Ambulatory Visit: Payer: Medicare Other | Admitting: Family Medicine

## 2022-05-01 ENCOUNTER — Ambulatory Visit (INDEPENDENT_AMBULATORY_CARE_PROVIDER_SITE_OTHER): Payer: Medicare Other | Admitting: Family Medicine

## 2022-05-01 ENCOUNTER — Encounter: Payer: Self-pay | Admitting: Family Medicine

## 2022-05-01 DIAGNOSIS — J441 Chronic obstructive pulmonary disease with (acute) exacerbation: Secondary | ICD-10-CM

## 2022-05-01 MED ORDER — PREDNISONE 20 MG PO TABS: 40.0000 mg | ORAL_TABLET | Freq: Every day | ORAL | 0 refills | Status: AC

## 2022-05-01 MED ORDER — BENZONATATE 100 MG PO CAPS: 100.0000 mg | ORAL_CAPSULE | Freq: Three times a day (TID) | ORAL | 0 refills | Status: DC | PRN

## 2022-05-01 MED ORDER — DOXYCYCLINE HYCLATE 100 MG PO TABS: 100.0000 mg | ORAL_TABLET | Freq: Two times a day (BID) | ORAL | 0 refills | Status: AC

## 2022-05-01 NOTE — Progress Notes (Signed)
Virtual Visit  Note Due to COVID-19 pandemic this visit was conducted virtually. This visit type was conducted due to national recommendations for restrictions regarding the COVID-19 Pandemic (e.g. social distancing, sheltering in place) in an effort to limit this patient's exposure and mitigate transmission in our community. All issues noted in this document were discussed and addressed.  A physical exam was not performed with this format.  I connected with Aimee Phillips on 05/01/22 at 1139 by telephone and verified that I am speaking with the correct person using two identifiers. Aimee Phillips is currently located at home and her husband is currently with her during the visit. The provider, Gabriel Earing, FNP is located in their office at time of visit.  I discussed the limitations, risks, security and privacy concerns of performing an evaluation and management service by telephone and the availability of in person appointments. I also discussed with the patient that there may be a patient responsible charge related to this service. The patient expressed understanding and agreed to proceed.  CC: cough  History and Present Illness:  Cough This is a new problem. The current episode started in the past 7 days. The problem has been unchanged. The cough is Productive of sputum. Associated symptoms include ear congestion, nasal congestion, rhinorrhea, a sore throat, shortness of breath (worse than baseline) and wheezing. Pertinent negatives include no chest pain, chills, ear pain, fever or headaches. The symptoms are aggravated by exercise. She has tried OTC cough suppressant for the symptoms. Her past medical history is significant for COPD and environmental allergies.  Her pulse ox has been 92-95%. She is on home oxygen. She has been using her inhalers as prescribed.     Review of Systems  Constitutional:  Negative for chills and fever.  HENT:  Positive for rhinorrhea and sore throat.  Negative for ear pain.   Respiratory:  Positive for cough, shortness of breath (worse than baseline) and wheezing.   Cardiovascular:  Negative for chest pain.  Neurological:  Negative for headaches.  Endo/Heme/Allergies:  Positive for environmental allergies.     Observations/Objective: Alert and oriented x 3. Able to speak in full sentences without difficulty.    Assessment and Plan: Aimee Phillips was seen today for cough.  Diagnoses and all orders for this visit:  Acute exacerbation of chronic obstructive pulmonary disease (COPD) (HCC) Doxycyline as below. Tessalon perles prn. Prednisone burst ordered. Continue inhalers as prescribed. Return to office for new or worsening symptoms, or if symptoms persist.  -     doxycycline (VIBRA-TABS) 100 MG tablet; Take 1 tablet (100 mg total) by mouth 2 (two) times daily for 7 days. 1 po bid -     benzonatate (TESSALON PERLES) 100 MG capsule; Take 1 capsule (100 mg total) by mouth 3 (three) times daily as needed for cough. -     predniSONE (DELTASONE) 20 MG tablet; Take 2 tablets (40 mg total) by mouth daily with breakfast for 5 days.    Follow Up Instructions: As needed.     I discussed the assessment and treatment plan with the patient. The patient was provided an opportunity to ask questions and all were answered. The patient agreed with the plan and demonstrated an understanding of the instructions.   The patient was advised to call back or seek an in-person evaluation if the symptoms worsen or if the condition fails to improve as anticipated.  The above assessment and management plan was discussed with the patient. The patient verbalized  understanding of and has agreed to the management plan. Patient is aware to call the clinic if symptoms persist or worsen. Patient is aware when to return to the clinic for a follow-up visit. Patient educated on when it is appropriate to go to the emergency department.   Time call ended:  0951  I provided 11  minutes of  non face-to-face time during this encounter.   Gabriel Earing, FNP

## 2022-05-05 DIAGNOSIS — J449 Chronic obstructive pulmonary disease, unspecified: Secondary | ICD-10-CM | POA: Diagnosis not present

## 2022-05-08 ENCOUNTER — Other Ambulatory Visit: Payer: Self-pay | Admitting: Family Medicine

## 2022-05-12 DIAGNOSIS — M25562 Pain in left knee: Secondary | ICD-10-CM | POA: Diagnosis not present

## 2022-05-12 DIAGNOSIS — Z79891 Long term (current) use of opiate analgesic: Secondary | ICD-10-CM | POA: Diagnosis not present

## 2022-05-12 DIAGNOSIS — M79673 Pain in unspecified foot: Secondary | ICD-10-CM | POA: Diagnosis not present

## 2022-05-12 DIAGNOSIS — G4733 Obstructive sleep apnea (adult) (pediatric): Secondary | ICD-10-CM | POA: Diagnosis not present

## 2022-05-12 DIAGNOSIS — G5603 Carpal tunnel syndrome, bilateral upper limbs: Secondary | ICD-10-CM | POA: Diagnosis not present

## 2022-05-12 DIAGNOSIS — M542 Cervicalgia: Secondary | ICD-10-CM | POA: Diagnosis not present

## 2022-05-12 DIAGNOSIS — M545 Low back pain, unspecified: Secondary | ICD-10-CM | POA: Diagnosis not present

## 2022-05-12 DIAGNOSIS — E1140 Type 2 diabetes mellitus with diabetic neuropathy, unspecified: Secondary | ICD-10-CM | POA: Diagnosis not present

## 2022-05-22 ENCOUNTER — Encounter: Payer: Self-pay | Admitting: Family Medicine

## 2022-05-22 ENCOUNTER — Ambulatory Visit (INDEPENDENT_AMBULATORY_CARE_PROVIDER_SITE_OTHER): Payer: Medicare Other | Admitting: Family Medicine

## 2022-05-22 ENCOUNTER — Ambulatory Visit (INDEPENDENT_AMBULATORY_CARE_PROVIDER_SITE_OTHER): Payer: Medicare Other

## 2022-05-22 VITALS — BP 127/71 | HR 83 | Temp 97.3°F | Wt 315.0 lb

## 2022-05-22 DIAGNOSIS — E1165 Type 2 diabetes mellitus with hyperglycemia: Secondary | ICD-10-CM

## 2022-05-22 DIAGNOSIS — R053 Chronic cough: Secondary | ICD-10-CM | POA: Diagnosis not present

## 2022-05-22 DIAGNOSIS — E1159 Type 2 diabetes mellitus with other circulatory complications: Secondary | ICD-10-CM

## 2022-05-22 DIAGNOSIS — F3132 Bipolar disorder, current episode depressed, moderate: Secondary | ICD-10-CM

## 2022-05-22 DIAGNOSIS — E785 Hyperlipidemia, unspecified: Secondary | ICD-10-CM | POA: Diagnosis not present

## 2022-05-22 DIAGNOSIS — E1169 Type 2 diabetes mellitus with other specified complication: Secondary | ICD-10-CM

## 2022-05-22 DIAGNOSIS — R6889 Other general symptoms and signs: Secondary | ICD-10-CM

## 2022-05-22 DIAGNOSIS — Z23 Encounter for immunization: Secondary | ICD-10-CM

## 2022-05-22 DIAGNOSIS — I152 Hypertension secondary to endocrine disorders: Secondary | ICD-10-CM

## 2022-05-22 DIAGNOSIS — R059 Cough, unspecified: Secondary | ICD-10-CM | POA: Diagnosis not present

## 2022-05-22 LAB — BAYER DCA HB A1C WAIVED: HB A1C (BAYER DCA - WAIVED): 6.2 % — ABNORMAL HIGH (ref 4.8–5.6)

## 2022-05-22 NOTE — Patient Instructions (Signed)
Get eye and pap smear appointments set up I've referred you to a therapist and psychiatrist here in [place]

## 2022-05-22 NOTE — Progress Notes (Signed)
Subjective: CC:DM PCP: [name], DO Aimee Phillips is a 51 y.o. female presenting to clinic today for:  1. Type 2 Diabetes with hypertension, hyperlipidemia:  Compliant with medications.  No hypoglycemic episodes.  The sugar this morning was 157.  Last eye exam: needs Last foot exam: needs Last A1c:  Lab Results  Component Value Date   HGBA1C 7.6 (H) 01/16/2022   Nephropathy screen indicated?: n/a Last flu, zoster and/or pneumovax:  Immunization History  Administered Date(s) Administered   Influenza Inj Mdck Quad Pf 08/31/2018   Influenza Split 08/16/2013, 08/11/2018, 06/04/2019   Influenza,inj,Quad PF,6+ Mos 07/28/2020, 10/07/2021   Influenza-Unspecified 08/31/2018   Pneumococcal Polysaccharide-23 12/03/2019   Tdap 02/14/2011    ROS: No chest pain, shortness of breath or visual disturbance reported but she does report ongoing cough despite treatment with antibiotics and steroids recently.  No hemoptysis.  No shortness of breath reported.  No fevers reported.  2. Bipolar disorder Continues to suffer with major depression.  This is despite multiple modalities of treatment.  She notes that her husband is struggling with complications of heart failure and is refusing to be compliant with his heart medications.  He she worries about him dropping dead anytime now.  She also is experiencing some empty nest syndrome as her son is an early college and will be leaving soon.  She does not have a counselor but would be willing to see one if they could see her virtually as she has transportation issues.   ROS: Per HPI  No Known Allergies Past Medical History:  Diagnosis Date   Asthma    Bipolar affect, depressed ([place])    Cellulitis    COPD (chronic obstructive pulmonary disease) (HCC)    Depression    Diabetes mellitus without complication (HCC)    Type II   PE (pulmonary embolism)    PONV (postoperative nausea and vomiting)    Pulmonary edema     Current  Outpatient Medications:    benzonatate (TESSALON PERLES) 100 MG capsule, Take 1 capsule (100 mg total) by mouth 3 (three) times daily as needed for cough., Disp: 20 capsule, Rfl: 0   blood glucose meter kit and supplies, Check BS up to four times a day, Disp: 1 each, Rfl: 0   Blood Glucose Monitoring Suppl DEVI, Check bs up to four times a day, Disp: 1 each, Rfl: 0   budesonide-formoterol (SYMBICORT) 160-4.5 MCG/ACT inhaler, Inhale 2 puffs into the lungs 2 (two) times daily. (Patient taking differently: Inhale 2 puffs into the lungs 2 (two) times daily as needed (respiratory issues.).), Disp: 10.2 g, Rfl: 11   buPROPion (WELLBUTRIN XL) 150 MG 24 hr tablet, Take 1 tablet (150 mg total) by mouth daily., Disp: 90 tablet, Rfl: 3   cyclobenzaprine (FLEXERIL) 10 MG tablet, Take 1 tablet (10 mg total) by mouth 3 (three) times daily as needed for muscle spasms., Disp: 90 tablet, Rfl: 5   dicyclomine (BENTYL) 10 MG capsule, TAKE ONE CAPSULE BY MOUTH EVERY 8 HOURS AS NEEDED SPASMS (FOR ABDOMINAL PAIN), Disp: 60 capsule, Rfl: 5   docusate sodium (COLACE) 100 MG capsule, Take 100 mg by mouth daily as needed (constipation.)., Disp: , Rfl:    DULoxetine (CYMBALTA) 30 MG capsule, TAKE ONE CAPSULE BY MOUTH ONCE DAILY WITH $RemoveBe'60MG'[id number]$  CAPSULES., Disp: 90 capsule, Rfl: 0   DULoxetine (CYMBALTA) 60 MG capsule, Take 1 capsule (60 mg total) by mouth daily., Disp: 90 capsule, Rfl: 1   furosemide (LASIX) 40 MG tablet, Take  1 tablet (40 mg total) by mouth 2 (two) times daily. (Patient taking differently: Take 40 mg by mouth 2 (two) times daily as needed for fluid.), Disp: 60 tablet, Rfl: 11   gabapentin (NEURONTIN) 300 MG capsule, Take 900 mg by mouth in the morning, at noon, in the evening, and at bedtime., Disp: , Rfl:    glucose blood (ACCU-CHEK AVIVA) test strip, Use to check blood sugar four times daily, Disp: 200 each, Rfl: 12   insulin aspart (NOVOLOG) 100 UNIT/ML injection, Inject 20 Units into the skin daily as needed for  high blood sugar., Disp: , Rfl:    insulin glargine (LANTUS SOLOSTAR) 100 UNIT/ML Solostar Pen, Inject 40 Units into the skin 2 (two) times daily. (Patient taking differently: Inject 60 Units into the skin daily as needed (BLOOD SUGAR >200.).), Disp: 70 mL, Rfl: 3   Insulin Pen Needle (NOVOFINE PLUS PEN NEEDLE) 32G X 4 MM MISC, UAD for insulin injection E11.65, Disp: 500 each, Rfl: 3   Lancet Devices MISC, Test QID, DX E11.65, Aviva Plus, Disp: 100 each, Rfl: prn   levocetirizine (XYZAL) 5 MG tablet, Take 1 tablet (5 mg total) by mouth every evening. For allergies, Disp: 90 tablet, Rfl: 3   linaclotide (LINZESS) 290 MCG CAPS capsule, Take 1 capsule (290 mcg total) by mouth daily before breakfast., Disp: 30 capsule, Rfl: 12   lisinopril (ZESTRIL) 5 MG tablet, TAKE ONE TABLET BY MOUTH DAILY (MORNING), Disp: 90 tablet, Rfl: 0   Misc. Devices (TRANSPORT CHAIR) MISC, Needs lift chair for >300lbs, Disp: 1 each, Rfl: 0   omeprazole (PRILOSEC) 20 MG capsule, TAKE ONE CAPSULE BY MOUTH ONCE DAILY, Disp: 90 capsule, Rfl: 0   oxyCODONE-acetaminophen (PERCOCET) 10-325 MG tablet, Take 1 tablet by mouth every 4 (four) hours as needed for pain., Disp: , Rfl:    rosuvastatin (CRESTOR) 20 MG tablet, TAKE ONE TABLET BY MOUTH DAILY (BEDTIME), Disp: 90 tablet, Rfl: 0   Semaglutide, 2 MG/DOSE, 8 MG/3ML SOPN, Inject 2 mg as directed once a week. (Patient taking differently: Inject 2 mg as directed every Tuesday.), Disp: 3 mL, Rfl: 12   solifenacin (VESICARE) 10 MG tablet, TAKE ONE TABLET BY MOUTH ONCE DAILY, Disp: 30 tablet, Rfl: 0   SPIRIVA HANDIHALER 18 MCG inhalation capsule, INHALE ONE PUFF BY MOUTH DAILY. (Patient taking differently: Place 18 mcg into inhaler and inhale daily as needed (asthma).), Disp: 30 capsule, Rfl: 12   topiramate (TOPAMAX) 100 MG tablet, Take 100 mg by mouth in the morning., Disp: , Rfl:  Social History   Socioeconomic History   Marital status: Married    Spouse name: Not on file   Number of  children: 3   Years of education: Not on file   Highest education level: High school graduate  Occupational History   Occupation: Disabled  Tobacco Use   Smoking status: Never   Smokeless tobacco: Never  Vaping Use   Vaping Use: Never used  Substance and Sexual Activity   Alcohol use: No   Drug use: No   Sexual activity: Not Currently  Other Topics Concern   Not on file  Social History Narrative   Not on file   Social Determinants of Health   Financial Resource Strain: Low Risk  (04/02/2019)   Overall Financial Resource Strain (CARDIA)    Difficulty of Paying Living Expenses: Not hard at all  Food Insecurity: No Food Insecurity (04/02/2019)   Hunger Vital Sign    Worried About Running Out of Food in  the Last Year: Never true    [place] in the Last Year: Never true  Transportation Needs: No Transportation Needs (04/02/2019)   PRAPARE - Hydrologist (Medical): No    Lack of Transportation (Non-Medical): No  Physical Activity: Inactive (04/02/2019)   Exercise Vital Sign    Days of Exercise per Week: 0 days    Minutes of Exercise per Session: 0 min  Stress: No Stress Concern Present (04/02/2019)   [place]    Feeling of Stress : Not at all  Social Connections: Unknown (04/02/2019)   Social Connection and Isolation Panel [NHANES]    Frequency of Communication with Friends and Family: More than three times a week    Frequency of Social Gatherings with Friends and Family: More than three times a week    Attends Religious Services: Not on file    Active Member of Clubs or Organizations: Not on file    Attends Archivist Meetings: Not on file    Marital Status: Not on file  Intimate Partner Violence: Not At Risk (04/02/2019)   Humiliation, Afraid, Rape, and Kick questionnaire    Fear of Current or Ex-Partner: No    Emotionally Abused: No    Physically Abused: No     Sexually Abused: No   Family History  Problem Relation Age of Onset   Cancer Mother        breast   Heart failure Father        died of AMI   Cancer Sister    Diabetes Brother    Heart disease Sister     Objective: Office vital signs reviewed. BP 127/71   Pulse 83   Temp (!) 97.3 F (36.3 C) (Temporal)   Wt (!) 315 lb (142.9 kg) Comment: wheelchair  LMP  (LMP Unknown)   SpO2 90%   BMI 46.52 kg/m   Physical Examination:  General: Awake, alert, morbidly obese, No acute distress HEENT: Sclera white. Cardio: regular rate and rhythm, S1S2 heard, no murmurs appreciated Pulm: clear to auscultation bilaterally, no wheezes, rhonchi or rales; normal work of breathing on room air Extremities: Cool toes.  No edema, cyanosis or clubbing; +2 pulses bilaterally MSK: Arrives in wheelchair; healing postsurgical scar noted along the left anterior shoulder Neuro: See diabetic foot  Diabetic Foot Exam - Simple   Simple Foot Form Diabetic Foot exam was performed with the following findings: Yes 05/22/2022 12:00 PM  Visual Inspection No deformities, no ulcerations, no other skin breakdown bilaterally: Yes Sensation Testing Intact to touch and monofilament testing bilaterally: Yes Pulse Check Posterior Tibialis and Dorsalis pulse intact bilaterally: Yes Comments      Assessment/ Plan: 51 y.o. female   Uncontrolled type 2 diabetes mellitus with hyperglycemia ([place]) - Plan: Bayer DCA Hb A1c Waived  Hyperlipidemia due to type 2 diabetes mellitus ([place]) - Plan: CMP14+EGFR, Lipid Panel  Hypertension associated with diabetes ([place]) - Plan: CMP14+EGFR  Abnormal ankle brachial index (ABI) - Plan: [name] Lower Ext Art Bilat  Morbid obesity ([place])  Bipolar affective disorder, currently depressed, moderate ([place]) - Plan: Ambulatory referral to Psychology, Ambulatory referral to Psychiatry  Persistent cough - Plan: DG Chest 2 View  Check A1c.  She is on max dose of Ozempic.  Would have to address  insulins if she remains uncontrolled.  She had a home health nurse visit recently.  Her circulation screening was normal on the right and borderline  on the left.  We will order repeat ABIs for this patient.  Continue statin.  Check lipid panel.  Blood pressure is at goal.  No changes  Continued weight loss recommended.  Her goal is to get less than 300 pounds.  Referral to both psychology and psychiatry for uncontrolled depressive symptoms that are exacerbated by recent events surrounding her husband and son  Lung exam was unremarkable but given persistent cough despite prednisone and antibiotics will obtain a chest x-ray to further evaluate.  Further recommendations pending this result  No orders of the defined types were placed in this encounter.  No orders of the defined types were placed in this encounter.    [name], DO [place] [phone]

## 2022-05-23 ENCOUNTER — Other Ambulatory Visit: Payer: Self-pay | Admitting: Family Medicine

## 2022-05-23 DIAGNOSIS — J441 Chronic obstructive pulmonary disease with (acute) exacerbation: Secondary | ICD-10-CM

## 2022-05-23 LAB — CMP14+EGFR
ALT: 31 IU/L (ref 0–32)
AST: 68 IU/L — ABNORMAL HIGH (ref 0–40)
Albumin/Globulin Ratio: 1.3 (ref 1.2–2.2)
Albumin: 3.9 g/dL (ref 3.9–4.9)
Alkaline Phosphatase: 107 IU/L (ref 44–121)
BUN/Creatinine Ratio: 11 (ref 9–23)
BUN: 8 mg/dL (ref 6–24)
Bilirubin Total: <0.2 mg/dL (ref 0.0–1.2)
CO2: 22 mmol/L (ref 20–29)
Calcium: 9.7 mg/dL (ref 8.7–10.2)
Chloride: 103 mmol/L (ref 96–106)
Creatinine, Ser: 0.73 mg/dL (ref 0.57–1.00)
Globulin, Total: 2.9 g/dL (ref 1.5–4.5)
Glucose: 155 mg/dL — ABNORMAL HIGH (ref 70–99)
Potassium: 4 mmol/L (ref 3.5–5.2)
Sodium: 138 mmol/L (ref 134–144)
Total Protein: 6.8 g/dL (ref 6.0–8.5)
eGFR: 100 mL/min/{1.73_m2} (ref >59)

## 2022-05-23 LAB — LIPID PANEL
Chol/HDL Ratio: 4.3 ratio (ref 0.0–4.4)
Cholesterol, Total: 137 mg/dL (ref 100–199)
HDL: 32 mg/dL — ABNORMAL LOW (ref >39)
LDL Chol Calc (NIH): 80 mg/dL (ref 0–99)
Triglycerides: 143 mg/dL (ref 0–149)
VLDL Cholesterol Cal: 25 mg/dL (ref 5–40)

## 2022-05-23 MED ORDER — BENZONATATE 100 MG PO CAPS: 100.0000 mg | ORAL_CAPSULE | Freq: Three times a day (TID) | ORAL | 0 refills | Status: DC | PRN

## 2022-05-25 ENCOUNTER — Other Ambulatory Visit: Payer: Self-pay | Admitting: Family Medicine

## 2022-05-25 DIAGNOSIS — E1159 Type 2 diabetes mellitus with other circulatory complications: Secondary | ICD-10-CM

## 2022-06-01 ENCOUNTER — Telehealth: Payer: Self-pay | Admitting: Family Medicine

## 2022-06-01 NOTE — Telephone Encounter (Signed)
Please call patient back with update on psychology referral.  [place] office is unable to see the referral since it is marked confidential because its mental health related.

## 2022-06-05 DIAGNOSIS — J449 Chronic obstructive pulmonary disease, unspecified: Secondary | ICD-10-CM | POA: Diagnosis not present

## 2022-06-06 ENCOUNTER — Other Ambulatory Visit: Payer: Self-pay | Admitting: Family Medicine

## 2022-06-06 DIAGNOSIS — E1165 Type 2 diabetes mellitus with hyperglycemia: Secondary | ICD-10-CM

## 2022-06-06 DIAGNOSIS — E1169 Type 2 diabetes mellitus with other specified complication: Secondary | ICD-10-CM

## 2022-06-06 DIAGNOSIS — J441 Chronic obstructive pulmonary disease with (acute) exacerbation: Secondary | ICD-10-CM

## 2022-06-06 DIAGNOSIS — R6889 Other general symptoms and signs: Secondary | ICD-10-CM

## 2022-06-06 DIAGNOSIS — I152 Hypertension secondary to endocrine disorders: Secondary | ICD-10-CM

## 2022-06-07 ENCOUNTER — Telehealth: Payer: Self-pay

## 2022-06-07 NOTE — Chronic Care Management (AMB) (Signed)
  Chronic Care Management   Note  06/07/2022 Name: Aimee Phillips MRN: [id number] DOB: 1970-09-30  Aimee Phillips is a 51 y.o. year old female who is a primary care patient of [name], DO. I reached out to [name] by phone today in response to a referral sent by [name] PCP.  [name] was given information about Chronic Care Management services today including:  CCM service includes personalized support from designated clinical staff supervised by her physician, including individualized plan of care and coordination with other care providers 24/7 contact phone numbers for assistance for urgent and routine care needs. Service will only be billed when office clinical staff spend 20 minutes or more in a month to coordinate care. Only one practitioner may furnish and bill the service in a calendar month. The patient may stop CCM services at any time (effective at the end of the month) by phone call to the office staff. The patient is responsible for co-pay (up to 20% after annual deductible is met) if co-pay is required by the individual health plan.   Patient agreed to services and verbal consent obtained.   Follow up plan: Telephone appointment with care management team member scheduled for:06/21/2022  Noreene Larsson, [place], [place] [id number] Direct Dial: [phone] [email or web address]

## 2022-06-15 NOTE — Progress Notes (Signed)
[facility] Quality Team Note  Name: KEASHA MALKIEWICZ Date of Birth: 26-Oct-1970 MRN: [id number] Date: 06/15/2022  [facility] Quality Team has reviewed this patient's chart, please see recommendations below:  Diabetic Retinal Eye Exam; Patient requests [facility] Quality Coordinator to schedule Diabetic Retinal Screening at ([place] event 06/22/2022, 10:30AM).

## 2022-06-21 ENCOUNTER — Ambulatory Visit (INDEPENDENT_AMBULATORY_CARE_PROVIDER_SITE_OTHER): Payer: Medicare Other | Admitting: Pharmacist

## 2022-06-21 DIAGNOSIS — E1165 Type 2 diabetes mellitus with hyperglycemia: Secondary | ICD-10-CM | POA: Diagnosis not present

## 2022-06-21 DIAGNOSIS — I152 Hypertension secondary to endocrine disorders: Secondary | ICD-10-CM

## 2022-06-21 DIAGNOSIS — E1159 Type 2 diabetes mellitus with other circulatory complications: Secondary | ICD-10-CM | POA: Diagnosis not present

## 2022-06-21 MED ORDER — FREESTYLE LIBRE 2 SENSOR MISC: 11 refills | Status: DC

## 2022-06-21 MED ORDER — FREESTYLE LIBRE 2 READER DEVI: 0 refills | Status: DC

## 2022-06-21 NOTE — Progress Notes (Signed)
    06/21/2022 Name: Aimee Phillips      MRN: [id number]       DOB: 11-14-1970   S:  51 yoF Presents for diabetes evaluation, education, and management.  Patient used to follow with Dr. Dorris Fetch endocrine, however she has not followed up.  She would like for PCP to manage T2DM condition.  She has made some improvements to her diabetes and she is interested in CGM.   Insurance coverage/medication affordability: [employer] dual medicaid/care   Patient reports adherence with medications. Current diabetes medications include:lantus, NOVOLOG, OZEMPIC Current hypertension medications include: lisinopril Goal 130/80 Current hyperlipidemia medications include: ROSUVASTATIN   Patient denies hypoglycemic events.   Discussed meal planning options and Plate method for healthy eating Avoid sugary drinks and desserts Incorporate balanced protein, non starchy veggies, 1 serving of carbohydrate with each meal Increase water intake Increase physical activity as able     O:      Lipid Panel     Component Value Date/Time   CHOL 137 05/22/2022 1132   TRIG 143 05/22/2022 1132   HDL 32 (L) 05/22/2022 1132   CHOLHDL 4.3 05/22/2022 1132   LDLCALC 80 05/22/2022 1132   LDLDIRECT 116 (H) 05/11/2021 1547   LABVLDL 25 05/22/2022 1132        Home fasting blood sugars: <130   2 hour post-meal/random blood sugars: n/a.   The 10-year ASCVD risk score Mikey Bussing DC Brooke Bonito., et al., 2013) is: 6.7%   Values used to calculate the score:     Age: 51 years     Sex: Female     Is Non-Hispanic African American: No     Diabetic: Yes     Tobacco smoker: No     Systolic Blood Pressure: 277 mmHg     Is BP treated: Yes     HDL Cholesterol: 36 mg/dL     Total Cholesterol: 174 mg/dL     A/P:   Diabetes T2DM currently CONTROLLED.  Patient is adherent with medication.    -CONTINUE OZEMPIC 2MG  WEEKLY  Denies personal and family history of Medullary thyroid cancer (MTC)  MAY CONSIDER MOUNJARO IN THE FUTURE, BUT CARDIAC  DATA NOT AS ROBUST  -CONTINUE ALL INSULIN AS PRESCRIBED; consider Tresiba U-200 or more concentrated insulin for better absorption   -START LIBRE 2 CGM  [facility]ED [facility] TO [facility] TO FILL)  -Extensively discussed pathophysiology of diabetes, recommended lifestyle interventions, dietary effects on blood sugar control   -Counseled on s/sx of and management of hypoglycemia   Written patient instructions provided.  Total time in face to face counseling 25 minutes.     Regina Eck, PharmD, BCPS Clinical Pharmacist, [place]  II Phone [phone]

## 2022-06-22 LAB — HM DIABETES EYE EXAM

## 2022-06-26 ENCOUNTER — Telehealth: Payer: Self-pay | Admitting: *Deleted

## 2022-06-26 DIAGNOSIS — S42302D Unspecified fracture of shaft of humerus, left arm, subsequent encounter for fracture with routine healing: Secondary | ICD-10-CM | POA: Diagnosis not present

## 2022-06-26 DIAGNOSIS — S42202D Unspecified fracture of upper end of left humerus, subsequent encounter for fracture with routine healing: Secondary | ICD-10-CM | POA: Diagnosis not present

## 2022-06-26 DIAGNOSIS — Z7409 Other reduced mobility: Secondary | ICD-10-CM

## 2022-06-26 NOTE — Patient Outreach (Signed)
  Care Coordination   Initial Visit Note   06/26/2022 Name: Aimee Phillips MRN: [id number] DOB: 03/20/1971  Aimee Phillips is a 51 y.o. year old female who sees [name], DO for primary care. I spoke with  Aimee Phillips by phone today.  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  I need an electric w/c, dental work and light wt O2 machine.    Goals Addressed             This Visit's Progress    I need a motorized wheel chair or scooter       Care Coordination Interventions: PT assessment done 02/28/22. Sent message to PT for update on progress.  Sent messages to primary care, Dr. Lajuana Ripple for her information.      I need my teeth pulled so I can get dentures.       Care Coordination Interventions: Pt has previously been advised to see a dentist to get a referral for oral surgery. Advised pt she needs to see a local dentist for the referral to see oral surgeon.      I would like the light weight Inogen O2 machine to make life easier.       Care Coordination Interventions: Currently on 3L. Messaged Dr. Danice Goltz with response. May need pulmonology consult to determine eligibility.        SDOH assessments and interventions completed:  Yes  SDOH Interventions Today    Flowsheet Row Most Recent Value  SDOH Interventions   Transportation Interventions Ambulatory [id number] Order        Care Coordination Interventions Activated:  Yes  Care Coordination Interventions:  Yes, provided   Follow up plan: Follow up call scheduled for next week.    Encounter Outcome:  Pt. Visit Completed   Kayleen Memos C. Myrtie Neither, MSN, [facility] Gerontological Nurse Practitioner [facility] Care Management [phone]

## 2022-06-27 ENCOUNTER — Telehealth: Payer: Self-pay | Admitting: *Deleted

## 2022-06-27 ENCOUNTER — Telehealth: Payer: Self-pay

## 2022-06-27 NOTE — Telephone Encounter (Signed)
Sent fax to [place] at [phone] The signed standard written order for pt's power wheelchair that was sent to [place] on 06/13/22 with coment asking to please let us know the status of the order by call me at [phone] or fax [phone], the physical therapist did their eval on 02/28/22 & these orders were signed by PCP at the beginning of this month.

## 2022-06-27 NOTE — Telephone Encounter (Addendum)
   Telephone encounter was:  Successful.  06/27/2022 Name: Aimee Phillips MRN: [id number] DOB: Apr 15, 1971  [name] is a 51 y.o. year old female who is a primary care patient of [name], DO . The community resource team was consulted for assistance with Transportation Needs   Care guide performed the following interventions: Spoke with patient about her transportation benefit through [employer] [phone], 24 roundtrips and to call 3 days in advance. Reminded patient that the customer service number is also on the back of her card. Mailed brochure for [employer] transportation.   Follow Up Plan:  No further follow up planned at this time. The patient has been provided with needed resources.  [place] Resource Care Guide   [email or web address]  ?? [phone]   Website: [email or web address]  [email or web address]

## 2022-07-04 ENCOUNTER — Telehealth: Payer: Self-pay | Admitting: *Deleted

## 2022-07-04 ENCOUNTER — Encounter: Payer: Self-pay | Admitting: *Deleted

## 2022-07-04 MED ORDER — FREESTYLE LIBRE 2 READER DEVI: 0 refills | Status: DC

## 2022-07-04 MED ORDER — FREESTYLE LIBRE 2 SENSOR MISC: 11 refills | Status: DC

## 2022-07-04 NOTE — Patient Outreach (Signed)
  Care Coordination   07/04/2022 Name: Aimee Phillips MRN: [id number] DOB: 25-Apr-1971   Care Coordination Outreach Attempts:  An unsuccessful telephone outreach was attempted today to offer the patient information about available care coordination services as a benefit of their health plan.   Follow Up Plan:  Additional outreach attempts will be made to offer the patient care coordination information and services.   Encounter Outcome:  No Answer  Care Coordination Interventions Activated:  Yes    Care Coordination Interventions:  Yes, provided     Goals Addressed             This Visit's Progress    I need a motorized wheel chair or scooter       Care Coordination Interventions: Sent message to PCP for update on W/C status.       Eulah Pont. Myrtie Neither, MSN, [facility] Gerontological Nurse Practitioner [facility] Care Management [phone]

## 2022-07-05 DIAGNOSIS — J449 Chronic obstructive pulmonary disease, unspecified: Secondary | ICD-10-CM | POA: Diagnosis not present

## 2022-07-06 ENCOUNTER — Ambulatory Visit: Payer: Self-pay | Admitting: *Deleted

## 2022-07-06 DIAGNOSIS — Z7409 Other reduced mobility: Secondary | ICD-10-CM

## 2022-07-07 ENCOUNTER — Encounter: Payer: Self-pay | Admitting: *Deleted

## 2022-07-07 NOTE — Patient Outreach (Signed)
  Care Coordination   Follow Up Visit Note   07/07/2022 Name: Aimee Phillips MRN: [id number] DOB: Mar 25, 1971  Aimee Phillips is a 51 y.o. year old female who sees [name], DO for primary care. I spoke with  Aimee Phillips by phone today.  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  Getting my electric wheel chair, my teeth pulled and handicap access to my apartment.    Goals Addressed             This Visit's Progress    COMPLETED: Exercise 150 min/wk Moderate Activity       07/06/22 [facility] NP Comment: Pt is not able to complete this goal, due to her mobility issues.     Have 3 meals a day       07/06/22  [facility] NP Comment: Pt preference is to only eat lunch and supper.     I need my teeth pulled so I can get dentures.       Care Coordination Interventions: Pt reported to NP that she has already had a dentist appt but that when she went to the oral surgeon and they wanted their own Xrays and their machine was down that day. She was told she would have to wait until January for another appt. NP to call Dr. Stefanie Libel and request an earlier appt.     I would like the light weight Inogen O2 machine to make life easier.       Care Coordination Interventions: Advised pt the her PCP believes she needs to have pulmonology appt to establish her eligibility for the Inogen machine.  Advised daughter of need and to make an appt. Gave her a suggested provider, Dr. Halford Chessman that is in [place]. Gave her the information that she will need a new pt visit and to advise why she wants to be seen. NP offered to do so around her schedule but she did not want to allow that due to her home circumstances and child care needs.         SDOH assessments and interventions completed:  Yes [id number] referral for LCSW to advocate for pt needs.  Care Coordination Interventions Activated:  Yes  Care Coordination Interventions:  Yes, provided   Follow up plan: Follow up call scheduled for 2  weeks.    Encounter Outcome:  Pt. Visit Completed   Kayleen Memos C. Myrtie Neither, MSN, [facility] Gerontological Nurse Practitioner [facility] Care Management [phone]

## 2022-07-07 NOTE — Patient Outreach (Unsigned)
  Care Coordination   Follow Up Visit Note   07/07/2022 Name: Aimee Phillips MRN: [id number] DOB: 12/13/1970  Aimee Phillips is a 51 y.o. year old female who sees [name], DO for primary care. I spoke with  Aimee Phillips by phone today.  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  ***    Goals Addressed   None     SDOH assessments and interventions completed:  {yes/no:20286}{[facility] Tip this will not be part of the note when signed-REQUIRED REPORT FIELD DO NOT DELETE (Optional):27901}     Care Coordination Interventions Activated:  {CCYES/NO:27769} {THN Tip this will not be part of the note when signed-REQUIRED REPORT FIELD DO NOT DELETE (Optional):27901} Care Coordination Interventions:  {INTERVENTIONS:27767} {[facility]N Tip this will not be part of the note when signed-REQUIRED REPORT FIELD DO NOT DELETE (Optional):27901}  Follow up plan: {CCFOLLOWUP:27768}   Encounter Outcome:  {ENCOUTCOME:27770} {THN Tip this will not be part of the note when signed-REQUIRED REPORT FIELD DO NOT DELETE (Optional):27901}

## 2022-07-10 DIAGNOSIS — Z9181 History of falling: Secondary | ICD-10-CM | POA: Diagnosis not present

## 2022-07-10 DIAGNOSIS — E119 Type 2 diabetes mellitus without complications: Secondary | ICD-10-CM | POA: Diagnosis not present

## 2022-07-10 DIAGNOSIS — Z7985 Long-term (current) use of injectable non-insulin antidiabetic drugs: Secondary | ICD-10-CM | POA: Diagnosis not present

## 2022-07-10 DIAGNOSIS — E7800 Pure hypercholesterolemia, unspecified: Secondary | ICD-10-CM | POA: Diagnosis not present

## 2022-07-10 DIAGNOSIS — Z79891 Long term (current) use of opiate analgesic: Secondary | ICD-10-CM | POA: Diagnosis not present

## 2022-07-10 DIAGNOSIS — Z7951 Long term (current) use of inhaled steroids: Secondary | ICD-10-CM | POA: Diagnosis not present

## 2022-07-10 DIAGNOSIS — N3281 Overactive bladder: Secondary | ICD-10-CM | POA: Diagnosis not present

## 2022-07-10 DIAGNOSIS — Z794 Long term (current) use of insulin: Secondary | ICD-10-CM | POA: Diagnosis not present

## 2022-07-10 DIAGNOSIS — Z9981 Dependence on supplemental oxygen: Secondary | ICD-10-CM | POA: Diagnosis not present

## 2022-07-10 DIAGNOSIS — R634 Abnormal weight loss: Secondary | ICD-10-CM | POA: Diagnosis not present

## 2022-07-10 DIAGNOSIS — F32A Depression, unspecified: Secondary | ICD-10-CM | POA: Diagnosis not present

## 2022-07-10 DIAGNOSIS — S42302D Unspecified fracture of shaft of humerus, left arm, subsequent encounter for fracture with routine healing: Secondary | ICD-10-CM | POA: Diagnosis not present

## 2022-07-10 DIAGNOSIS — S42202D Unspecified fracture of upper end of left humerus, subsequent encounter for fracture with routine healing: Secondary | ICD-10-CM | POA: Diagnosis not present

## 2022-07-11 ENCOUNTER — Other Ambulatory Visit: Payer: Self-pay | Admitting: Family Medicine

## 2022-07-12 ENCOUNTER — Telehealth: Payer: Self-pay | Admitting: *Deleted

## 2022-07-12 ENCOUNTER — Telehealth: Payer: Self-pay | Admitting: Family Medicine

## 2022-07-12 DIAGNOSIS — Z7409 Other reduced mobility: Secondary | ICD-10-CM

## 2022-07-12 DIAGNOSIS — G8929 Other chronic pain: Secondary | ICD-10-CM

## 2022-07-12 NOTE — Telephone Encounter (Signed)
Options are: [facility] in [place], there is a pain center in [place] and [place] as well.  Glad to refer to any just let me know where she wants referral sent ASAP so as to not delay ongoing care.

## 2022-07-12 NOTE — Telephone Encounter (Signed)
Pt prefers [facility]

## 2022-07-12 NOTE — Addendum Note (Signed)
Addended by: [name] on: 07/12/2022 10:29 AM   Modules accepted: Orders

## 2022-07-12 NOTE — Telephone Encounter (Signed)
   Telephone encounter was:  Successful.  07/12/2022 Name: Aimee Phillips MRN: [id number] DOB: 1971-01-17  [name] is a 51 y.o. year old female who is a primary care patient of [name], DO . The community resource team was consulted for assistance with Transportation Needs  and housing  fair housing information provided will send the housing authority and other housing , also about rcats transportation told to call [place] health care transportation for alternative Care guide performed the following interventions: Patient provided with information about care guide support team and interviewed to confirm resource needs.  Follow Up Plan:  No further follow up planned at this time. The patient has been provided with needed resources.  [place] [phone] [address] , [place] [address] Email : [email or web address]

## 2022-07-12 NOTE — Telephone Encounter (Signed)
   Telephone encounter was:  Unsuccessful.  07/12/2022 Name: Aimee Phillips MRN: [id number] DOB: 10/22/1970  Unsuccessful outbound call made today to assist with:  Home Modifications  Outreach Attempt:  1st Attempt  A HIPAA compliant voice message was left requesting a return call.  Instructed patient to call back at [phone].  [place] [phone] [address] , [place] [address] Email : [email or web address]

## 2022-07-13 ENCOUNTER — Encounter: Payer: Self-pay | Admitting: *Deleted

## 2022-07-13 NOTE — Patient Instructions (Signed)
Visit Information  Thank you for taking time to visit with me today. Please don't hesitate to contact me if I can be of assistance to you.   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 Provided with direct contact information for care management team members. Screened for signs and symptoms of depression, related to patient's chronic disease state.  PHQ2/PHQ9 Depression Screening Tool completed and results reviewed.  Suicidal ideation/homicidal ideation of patient assessed - none present. Solution-focused strategies employed. Assessed social determinant of health barriers. Active listening/reflection utilized.  Problem solving/task-centered strategies developed. Quality of sleep assessed and sleep hygiene techniques promoted.  Encouraged verbalization of feelings.    Emotional support provided. Caregiver stress acknowledged. Transportation resources provided and thoroughly reviewed.  ~ Encouraged to reinstate transportation services through [employer], technical sales). CSW collaboration with Glass blower/designer at [employer] [phone]), to discuss installation of handicapped accessible wheelchair ramp.  ~ HIPAA compliant message left on voicemail, as CSW continues to await a return call.       Our next appointment is by telephone on 07/27/2022 at 9:30 am.  Please call the care guide team at [phone] if you need to cancel or reschedule your appointment.   If you are experiencing a Mental Health or [place] or need someone to talk to, please call the Suicide and Crisis Lifeline: 988 call the [place] National Suicide Prevention Lifeline: (615)323-0922 or TTY: 934-226-9383 TTY  (901)524-4322) to talk to a trained counselor call 1-800-273-TALK (toll free, 24 hour hotline) go to [facility] [address], [place] [phone]) call the [place]: [phone] call 911  Patient verbalizes understanding of instructions and care plan provided today and agrees to view in [place]. Active MyChart status and patient understanding of how to access instructions and care plan via MyChart confirmed with patient.     Telephone follow up appointment with care management team member scheduled for:  07/27/2022 at 9:30 am.  Nat Christen, BSW, MSW, [place]  Licensed Clinical Social Worker  [facility]  Mailing [place]. [address], [place], [place] [address] Physical Address-[address], [place], [place] [address] Toll Free Main # [phone] Fax # [phone] Cell # [phone] [email or web address]

## 2022-07-13 NOTE — Patient Outreach (Signed)
  Care Coordination   Initial Visit Note   07/13/2022  Name: Aimee Phillips MRN: [id number] DOB: 01/11/1971  Aimee Phillips is a 51 y.o. year old female who sees [name], DO for primary care. I spoke with Aimee Phillips by phone today.  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  Fish farm manager.   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 Provided with direct contact information for care management team members. Screened for signs and symptoms of depression, related to patient's chronic disease state.  PHQ2/PHQ9 Depression Screening Tool completed and results reviewed.  Suicidal ideation/homicidal ideation of patient assessed - none present. Solution-focused strategies employed. Assessed social determinant of health barriers. Active listening/reflection utilized.  Problem solving/task-centered strategies developed. Quality of sleep assessed and sleep hygiene techniques promoted.  Encouraged verbalization of feelings.    Emotional support provided. Caregiver stress acknowledged. Transportation resources provided and thoroughly reviewed.  ~ Encouraged to reinstate transportation services through [employer], technical sales). CSW collaboration with Glass blower/designer at [employer] [phone]), to discuss installation of handicapped accessible wheelchair ramp.  ~ HIPAA compliant message left on voicemail, as CSW continues to await a return call.         SDOH assessments and interventions completed:  Yes.  SDOH Interventions Today    Flowsheet Row Most Recent Value  SDOH Interventions   Food Insecurity Interventions Intervention Not Indicated  Housing Interventions Intervention Not  Indicated  Transportation Interventions Other (Comment)  [Referred to [employer] and Provided Transportation Resources.]  Utilities Interventions Intervention Not Indicated  Alcohol Usage Interventions Intervention Not Indicated (Score <7)  Financial Strain Interventions Intervention Not Indicated  Physical Activity Interventions Patient Refused  Stress Interventions Intervention Not Indicated  Social Connections Interventions Intervention Not Indicated     Care Coordination Interventions Activated:  Yes.   Care Coordination Interventions:  Yes, provided.   Follow up plan: Follow up call scheduled for 07/27/2022 at 9:30 am.  Encounter Outcome:  Pt. Visit Completed.   Nat Christen, BSW, MSW, LCSW  Licensed Education officer, [facility]  Mailing [place] [address], [place], [place] [address] Physical Address-[address], [place], [place] [address] Toll Free Main # [phone] Fax # [phone] Cell # [phone] [email or web address]

## 2022-07-17 ENCOUNTER — Telehealth: Payer: Self-pay | Admitting: *Deleted

## 2022-07-17 NOTE — Patient Outreach (Signed)
  Care Coordination   Follow Up Visit Note   07/17/2022 Name: Aimee Phillips MRN: [id number] DOB: 03-15-1971  Aimee Phillips is a 51 y.o. year old female who sees Aimee Phillips for primary care.  CALLED Aimee Phillips, DDS, TO REQUEST SERVICE FOR THIS PT BEFORE JANUARY.  CALLED [facility] TO VERIFY STATUS OF ELECTRIC WHEEL CHAIR: HAS BEEN ORDERED AND SHOULD BE READY WITHIN THE NEXT 2 WEEKS.  SDOH assessments and interventions completed:  Yes Previously addressed.   Care Coordination Interventions Activated:  Yes  Care Coordination Interventions:  Yes, provided   Follow up plan: Follow up call scheduled for Nov 14th.    Encounter Outcome:  Pt. Visit Completed

## 2022-07-19 ENCOUNTER — Ambulatory Visit (HOSPITAL_COMMUNITY): Payer: Medicare Other | Admitting: Psychiatry

## 2022-07-19 ENCOUNTER — Encounter (HOSPITAL_COMMUNITY): Payer: Self-pay

## 2022-07-19 DIAGNOSIS — Z7951 Long term (current) use of inhaled steroids: Secondary | ICD-10-CM | POA: Diagnosis not present

## 2022-07-19 DIAGNOSIS — Z79891 Long term (current) use of opiate analgesic: Secondary | ICD-10-CM | POA: Diagnosis not present

## 2022-07-19 DIAGNOSIS — S42302D Unspecified fracture of shaft of humerus, left arm, subsequent encounter for fracture with routine healing: Secondary | ICD-10-CM | POA: Diagnosis not present

## 2022-07-19 DIAGNOSIS — Z9981 Dependence on supplemental oxygen: Secondary | ICD-10-CM | POA: Diagnosis not present

## 2022-07-19 DIAGNOSIS — Z9181 History of falling: Secondary | ICD-10-CM | POA: Diagnosis not present

## 2022-07-19 DIAGNOSIS — E119 Type 2 diabetes mellitus without complications: Secondary | ICD-10-CM | POA: Diagnosis not present

## 2022-07-19 DIAGNOSIS — R634 Abnormal weight loss: Secondary | ICD-10-CM | POA: Diagnosis not present

## 2022-07-19 DIAGNOSIS — Z794 Long term (current) use of insulin: Secondary | ICD-10-CM | POA: Diagnosis not present

## 2022-07-19 DIAGNOSIS — S42202D Unspecified fracture of upper end of left humerus, subsequent encounter for fracture with routine healing: Secondary | ICD-10-CM | POA: Diagnosis not present

## 2022-07-19 DIAGNOSIS — N3281 Overactive bladder: Secondary | ICD-10-CM | POA: Diagnosis not present

## 2022-07-19 DIAGNOSIS — Z7985 Long-term (current) use of injectable non-insulin antidiabetic drugs: Secondary | ICD-10-CM | POA: Diagnosis not present

## 2022-07-19 DIAGNOSIS — E7800 Pure hypercholesterolemia, unspecified: Secondary | ICD-10-CM | POA: Diagnosis not present

## 2022-07-19 DIAGNOSIS — F32A Depression, unspecified: Secondary | ICD-10-CM | POA: Diagnosis not present

## 2022-07-20 DIAGNOSIS — Z7985 Long-term (current) use of injectable non-insulin antidiabetic drugs: Secondary | ICD-10-CM | POA: Diagnosis not present

## 2022-07-20 DIAGNOSIS — Z9981 Dependence on supplemental oxygen: Secondary | ICD-10-CM | POA: Diagnosis not present

## 2022-07-20 DIAGNOSIS — M79606 Pain in leg, unspecified: Secondary | ICD-10-CM | POA: Diagnosis not present

## 2022-07-20 DIAGNOSIS — Z7951 Long term (current) use of inhaled steroids: Secondary | ICD-10-CM | POA: Diagnosis not present

## 2022-07-20 DIAGNOSIS — M549 Dorsalgia, unspecified: Secondary | ICD-10-CM | POA: Diagnosis not present

## 2022-07-20 DIAGNOSIS — S42202D Unspecified fracture of upper end of left humerus, subsequent encounter for fracture with routine healing: Secondary | ICD-10-CM | POA: Diagnosis not present

## 2022-07-20 DIAGNOSIS — F32A Depression, unspecified: Secondary | ICD-10-CM | POA: Diagnosis not present

## 2022-07-20 DIAGNOSIS — Z9181 History of falling: Secondary | ICD-10-CM | POA: Diagnosis not present

## 2022-07-20 DIAGNOSIS — E7800 Pure hypercholesterolemia, unspecified: Secondary | ICD-10-CM | POA: Diagnosis not present

## 2022-07-20 DIAGNOSIS — R634 Abnormal weight loss: Secondary | ICD-10-CM | POA: Diagnosis not present

## 2022-07-20 DIAGNOSIS — E119 Type 2 diabetes mellitus without complications: Secondary | ICD-10-CM | POA: Diagnosis not present

## 2022-07-20 DIAGNOSIS — M79671 Pain in right foot: Secondary | ICD-10-CM | POA: Diagnosis not present

## 2022-07-20 DIAGNOSIS — N3281 Overactive bladder: Secondary | ICD-10-CM | POA: Diagnosis not present

## 2022-07-20 DIAGNOSIS — Z79891 Long term (current) use of opiate analgesic: Secondary | ICD-10-CM | POA: Diagnosis not present

## 2022-07-20 DIAGNOSIS — Z794 Long term (current) use of insulin: Secondary | ICD-10-CM | POA: Diagnosis not present

## 2022-07-20 DIAGNOSIS — S42302D Unspecified fracture of shaft of humerus, left arm, subsequent encounter for fracture with routine healing: Secondary | ICD-10-CM | POA: Diagnosis not present

## 2022-07-20 DIAGNOSIS — M79672 Pain in left foot: Secondary | ICD-10-CM | POA: Diagnosis not present

## 2022-07-21 ENCOUNTER — Telehealth: Payer: Self-pay | Admitting: Family Medicine

## 2022-07-21 DIAGNOSIS — Z794 Long term (current) use of insulin: Secondary | ICD-10-CM | POA: Diagnosis not present

## 2022-07-21 DIAGNOSIS — Z7951 Long term (current) use of inhaled steroids: Secondary | ICD-10-CM | POA: Diagnosis not present

## 2022-07-21 DIAGNOSIS — F32A Depression, unspecified: Secondary | ICD-10-CM | POA: Diagnosis not present

## 2022-07-21 DIAGNOSIS — Z9181 History of falling: Secondary | ICD-10-CM | POA: Diagnosis not present

## 2022-07-21 DIAGNOSIS — S42202D Unspecified fracture of upper end of left humerus, subsequent encounter for fracture with routine healing: Secondary | ICD-10-CM | POA: Diagnosis not present

## 2022-07-21 DIAGNOSIS — E119 Type 2 diabetes mellitus without complications: Secondary | ICD-10-CM | POA: Diagnosis not present

## 2022-07-21 DIAGNOSIS — Z9981 Dependence on supplemental oxygen: Secondary | ICD-10-CM | POA: Diagnosis not present

## 2022-07-21 DIAGNOSIS — Z7985 Long-term (current) use of injectable non-insulin antidiabetic drugs: Secondary | ICD-10-CM | POA: Diagnosis not present

## 2022-07-21 DIAGNOSIS — N3281 Overactive bladder: Secondary | ICD-10-CM | POA: Diagnosis not present

## 2022-07-21 DIAGNOSIS — S42302D Unspecified fracture of shaft of humerus, left arm, subsequent encounter for fracture with routine healing: Secondary | ICD-10-CM | POA: Diagnosis not present

## 2022-07-21 DIAGNOSIS — R634 Abnormal weight loss: Secondary | ICD-10-CM | POA: Diagnosis not present

## 2022-07-21 DIAGNOSIS — E7800 Pure hypercholesterolemia, unspecified: Secondary | ICD-10-CM | POA: Diagnosis not present

## 2022-07-21 DIAGNOSIS — Z79891 Long term (current) use of opiate analgesic: Secondary | ICD-10-CM | POA: Diagnosis not present

## 2022-07-21 NOTE — Telephone Encounter (Signed)
Lmtcb/ww  

## 2022-07-24 DIAGNOSIS — N3281 Overactive bladder: Secondary | ICD-10-CM | POA: Diagnosis not present

## 2022-07-24 DIAGNOSIS — S42202D Unspecified fracture of upper end of left humerus, subsequent encounter for fracture with routine healing: Secondary | ICD-10-CM | POA: Diagnosis not present

## 2022-07-24 DIAGNOSIS — Z794 Long term (current) use of insulin: Secondary | ICD-10-CM | POA: Diagnosis not present

## 2022-07-24 DIAGNOSIS — E7800 Pure hypercholesterolemia, unspecified: Secondary | ICD-10-CM | POA: Diagnosis not present

## 2022-07-24 DIAGNOSIS — S42302D Unspecified fracture of shaft of humerus, left arm, subsequent encounter for fracture with routine healing: Secondary | ICD-10-CM | POA: Diagnosis not present

## 2022-07-24 DIAGNOSIS — Z9981 Dependence on supplemental oxygen: Secondary | ICD-10-CM | POA: Diagnosis not present

## 2022-07-24 DIAGNOSIS — E119 Type 2 diabetes mellitus without complications: Secondary | ICD-10-CM | POA: Diagnosis not present

## 2022-07-24 DIAGNOSIS — Z79891 Long term (current) use of opiate analgesic: Secondary | ICD-10-CM | POA: Diagnosis not present

## 2022-07-24 DIAGNOSIS — Z9181 History of falling: Secondary | ICD-10-CM | POA: Diagnosis not present

## 2022-07-24 DIAGNOSIS — F32A Depression, unspecified: Secondary | ICD-10-CM | POA: Diagnosis not present

## 2022-07-24 DIAGNOSIS — R634 Abnormal weight loss: Secondary | ICD-10-CM | POA: Diagnosis not present

## 2022-07-24 DIAGNOSIS — Z7985 Long-term (current) use of injectable non-insulin antidiabetic drugs: Secondary | ICD-10-CM | POA: Diagnosis not present

## 2022-07-24 DIAGNOSIS — Z7951 Long term (current) use of inhaled steroids: Secondary | ICD-10-CM | POA: Diagnosis not present

## 2022-07-24 NOTE — Telephone Encounter (Signed)
Returning call. Please call back.

## 2022-07-24 NOTE — Telephone Encounter (Signed)
VO given for OT frequency

## 2022-07-25 ENCOUNTER — Encounter: Payer: Self-pay | Admitting: *Deleted

## 2022-07-25 ENCOUNTER — Telehealth: Payer: Self-pay | Admitting: *Deleted

## 2022-07-25 NOTE — Patient Outreach (Signed)
  Care Coordination   Follow Up Visit Note   07/25/2022 Name: Aimee Phillips MRN: [id number] DOB: [birth year]  Aimee Phillips is a 51 y.o. year old female who sees Raliegh Ip, DO for primary care. I spoke with  Aimee Phillips by phone today.  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  Getting my dental surgery scheduled.    Goals Addressed             This Visit's Progress    COMPLETED: Exercise 150 min/wk Moderate Activity       Care Coordination Interventions: Pt and NP discussed the appropriateness of this goal at this time with her limited mobility and we decided this goal will never be accomplished. We discussed that it is important for her to give 100% during her therapy sessions and to do the recommended exercises on the days she does not have therapy.     COMPLETED: Have 3 meals a day   No change    Care Coordination Interventions: Advised patient, providing education and rationale, to check cbg per MD order and record, calling provider for findings outside established parameters  Pt states this goal is not going to be achieved. She will only be eating 2 meals as she has done for most of her life.       COMPLETED: I need a motorized wheel chair or scooter   On track    Care Coordination Interventions: Evaluation of current treatment plan related to MOBILITY NEEDS. and patient's adherence to plan as established by provider  PATIENT NOW HAS HER MOTORIZED WHEELCHAIR!!!      I need my teeth pulled so I can get dentures.   Not on track    Care Coordination Interventions: Patient interviewed about adult health maintenance status including  Regular Dental Care    NP made 2nd call to Dr. Barbette Merino, DDS, leaving another message requesting an appt for pt.     I would like the light weight Inogen O2 machine to make life easier.   On track    Care Coordination Interventions: Provided patient with basic written and verbal COPD education on self  care/management/and exacerbation prevention Provided instruction about proper use of medications used for management of COPD including inhalers Advised patient to self assesses COPD action plan zone and make appointment with provider if in the yellow zone for 48 hours without improvement Provided education about and advised patient to utilize infection prevention strategies to reduce risk of respiratory infection  Patient has a pulmonology appt scheduled for Dec 20th to be evaluated to see if she is eligible to use an Inogen Oxygen unit.          SDOH assessments and interventions completed:  Yes Previously assessed.   Care Coordination Interventions Activated:  Yes  Care Coordination Interventions:  Yes, provided   Follow up plan: Follow up call scheduled for one month.    Encounter Outcome:  Pt. Visit Completed   Noralyn Pick C. Burgess Estelle, MSN, [facility] Gerontological Nurse Practitioner [facility] Care Management [phone]

## 2022-07-26 DIAGNOSIS — Z9181 History of falling: Secondary | ICD-10-CM | POA: Diagnosis not present

## 2022-07-26 DIAGNOSIS — S42202D Unspecified fracture of upper end of left humerus, subsequent encounter for fracture with routine healing: Secondary | ICD-10-CM | POA: Diagnosis not present

## 2022-07-26 DIAGNOSIS — Z79891 Long term (current) use of opiate analgesic: Secondary | ICD-10-CM | POA: Diagnosis not present

## 2022-07-26 DIAGNOSIS — Z794 Long term (current) use of insulin: Secondary | ICD-10-CM | POA: Diagnosis not present

## 2022-07-26 DIAGNOSIS — Z7951 Long term (current) use of inhaled steroids: Secondary | ICD-10-CM | POA: Diagnosis not present

## 2022-07-26 DIAGNOSIS — Z9981 Dependence on supplemental oxygen: Secondary | ICD-10-CM | POA: Diagnosis not present

## 2022-07-26 DIAGNOSIS — S42302D Unspecified fracture of shaft of humerus, left arm, subsequent encounter for fracture with routine healing: Secondary | ICD-10-CM | POA: Diagnosis not present

## 2022-07-26 DIAGNOSIS — N3281 Overactive bladder: Secondary | ICD-10-CM | POA: Diagnosis not present

## 2022-07-26 DIAGNOSIS — E7800 Pure hypercholesterolemia, unspecified: Secondary | ICD-10-CM | POA: Diagnosis not present

## 2022-07-26 DIAGNOSIS — Z7985 Long-term (current) use of injectable non-insulin antidiabetic drugs: Secondary | ICD-10-CM | POA: Diagnosis not present

## 2022-07-26 DIAGNOSIS — F32A Depression, unspecified: Secondary | ICD-10-CM | POA: Diagnosis not present

## 2022-07-26 DIAGNOSIS — R634 Abnormal weight loss: Secondary | ICD-10-CM | POA: Diagnosis not present

## 2022-07-26 DIAGNOSIS — E119 Type 2 diabetes mellitus without complications: Secondary | ICD-10-CM | POA: Diagnosis not present

## 2022-07-27 ENCOUNTER — Encounter: Payer: Self-pay | Admitting: *Deleted

## 2022-07-27 ENCOUNTER — Ambulatory Visit: Payer: Self-pay | Admitting: *Deleted

## 2022-07-27 DIAGNOSIS — J449 Chronic obstructive pulmonary disease, unspecified: Secondary | ICD-10-CM

## 2022-07-27 DIAGNOSIS — S22080K Wedge compression fracture of T11-T12 vertebra, subsequent encounter for fracture with nonunion: Secondary | ICD-10-CM

## 2022-07-27 DIAGNOSIS — M6281 Muscle weakness (generalized): Secondary | ICD-10-CM

## 2022-07-27 DIAGNOSIS — G629 Polyneuropathy, unspecified: Secondary | ICD-10-CM

## 2022-07-27 NOTE — Patient Instructions (Signed)
Visit Information  Thank you for taking time to visit with me today. Please don't hesitate to contact me if I can be of assistance to you.   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  Task-centered strategies employed. Encouraged verbalization of feelings.    Emotional support provided. Caregiver stress acknowledged. Continue to encourage reinstatement of transportation services through [facility]). CSW collaboration with [employer], to place referral for transportation services, agencies and resources. CSW collaboration with [employer], to place referral for food and nutritional services, agencies and resources. CSW collaboration with Print production planner at [employer] [phone]), to discuss installation of handicapped accessible wheelchair ramp.  ~ HIPAA compliant messages left on voicemail, as CSW continues to await a return call. Continue to utilize new power wheelchair, for mobility and safety purposes.   [address] [phone]), to obtain financial assistance with installation of handicapped accessible wheelchair ramp. Contact Americans with Disabilities ACT (# 1.339 403 3398 or # 1.8673225559), to inquire about legal rights as a disabled individual.       Our next appointment is by telephone on 08/10/2022 at 9:30 am.  Please call the care guide team at [phone] if you need to cancel or reschedule your appointment.   If you are experiencing a Mental Health or Behavioral Health Crisis or need someone to talk to, please call the Suicide and Crisis Lifeline: 988 call the [place] National Suicide Prevention Lifeline: (636)199-8760 or TTY: 8476108713 TTY 406-270-8970) to talk  to a trained counselor call 1-800-273-TALK (toll free, 24 hour hotline) go to [facility] [address], [place] [phone]) call the [facility] Crisis Line: [phone] call 911  Patient verbalizes understanding of instructions and care plan provided today and agrees to view in MyChart. Active MyChart status and patient understanding of how to access instructions and care plan via MyChart confirmed with patient.     Telephone follow up appointment with care management team member scheduled for:  08/10/2022 at 9:30 am.  Danford Bad, BSW, MSW, LCSW  Licensed Clinical Social Worker  [employer] [facility]  Mailing [place]. [address], [place], [place] [address] Physical Address-[address] [place], [place] [address] Toll Free Main # [phone] Fax # [phone] Cell # [phone] [email or web address]

## 2022-07-27 NOTE — Patient Outreach (Addendum)
  Care Coordination   Follow Up Visit Note   07/27/2022  Name: Aimee Phillips MRN: [id number] DOB: 11-06-1970  Aimee Phillips is a 51 y.o. year old female who sees Raliegh Ip, DO for primary care. I spoke with Aimee Phillips by phone today.  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    Goals Addressed               This Visit's Progress     Warehouse manager. (pt-stated)   On track     Care Coordination Interventions:  Active listening utilized.  Task-centered strategies employed. Encouraged verbalization of feelings.    Emotional support provided. Caregiver stress acknowledged. Continue to encourage reinstatement of transportation services through [facility]). CSW collaboration with [employer], to place referral for transportation services, agencies and resources. CSW collaboration with [employer], to place referral for food and nutritional services, agencies and resources. CSW collaboration with Print production planner at [employer] [phone]), to discuss installation of handicapped accessible wheelchair ramp.  ~ HIPAA compliant messages left on voicemail, as CSW continues to await a return call. Continue to utilize new power wheelchair, for mobility and safety purposes.   [address] [phone]), to obtain financial assistance with installation of handicapped accessible wheelchair ramp. Contact Americans with Disabilities ACT (# 1.(614)704-5152 or # 1.(240)801-7599), to inquire about legal rights as a disabled individual.       SDOH assessments and interventions completed:  Yes.  SDOH Interventions Today    Flowsheet Row Most Recent Value  SDOH Interventions   Food Insecurity Interventions AMB Referral  [Food Banks, Food Pantries and Soup Kitchens Provided.]     Care  Coordination Interventions Activated:  Yes.   Care Coordination Interventions:  Yes, provided.   Follow up plan: Follow up call scheduled for 08/10/2022 at 9:30 am.  Encounter Outcome:  Pt. Visit Completed.   Danford Bad, BSW, MSW, LCSW  Licensed Restaurant manager, [facility]  [place]ailing [place], [place], [place] [address] Physical Address-[address], [place], [place] [address] Toll Free Main # [phone] Fax # [phone] Cell # [phone] [email or web address]

## 2022-07-28 ENCOUNTER — Telehealth: Payer: Self-pay | Admitting: *Deleted

## 2022-07-28 DIAGNOSIS — Z79891 Long term (current) use of opiate analgesic: Secondary | ICD-10-CM | POA: Diagnosis not present

## 2022-07-28 DIAGNOSIS — Z7985 Long-term (current) use of injectable non-insulin antidiabetic drugs: Secondary | ICD-10-CM | POA: Diagnosis not present

## 2022-07-28 DIAGNOSIS — S42202D Unspecified fracture of upper end of left humerus, subsequent encounter for fracture with routine healing: Secondary | ICD-10-CM | POA: Diagnosis not present

## 2022-07-28 DIAGNOSIS — E7800 Pure hypercholesterolemia, unspecified: Secondary | ICD-10-CM | POA: Diagnosis not present

## 2022-07-28 DIAGNOSIS — Z9981 Dependence on supplemental oxygen: Secondary | ICD-10-CM | POA: Diagnosis not present

## 2022-07-28 DIAGNOSIS — R634 Abnormal weight loss: Secondary | ICD-10-CM | POA: Diagnosis not present

## 2022-07-28 DIAGNOSIS — E119 Type 2 diabetes mellitus without complications: Secondary | ICD-10-CM | POA: Diagnosis not present

## 2022-07-28 DIAGNOSIS — F32A Depression, unspecified: Secondary | ICD-10-CM | POA: Diagnosis not present

## 2022-07-28 DIAGNOSIS — Z7951 Long term (current) use of inhaled steroids: Secondary | ICD-10-CM | POA: Diagnosis not present

## 2022-07-28 DIAGNOSIS — Z794 Long term (current) use of insulin: Secondary | ICD-10-CM | POA: Diagnosis not present

## 2022-07-28 DIAGNOSIS — Z9181 History of falling: Secondary | ICD-10-CM | POA: Diagnosis not present

## 2022-07-28 DIAGNOSIS — N3281 Overactive bladder: Secondary | ICD-10-CM | POA: Diagnosis not present

## 2022-07-28 DIAGNOSIS — S42302D Unspecified fracture of shaft of humerus, left arm, subsequent encounter for fracture with routine healing: Secondary | ICD-10-CM | POA: Diagnosis not present

## 2022-07-28 NOTE — Progress Notes (Signed)
  Care Coordination   Note   07/28/2022 Name: MEMPHIS CRESWELL MRN: [id number] DOB: Jan 23, 1971  Aimee Phillips is a 51 y.o. year old female who sees Raliegh Ip, DO for primary care. I reached out to Aimee Phillips by phone today to offer care coordination services.  Aimee Phillips was given information about Care Coordination services today including:   The Care Coordination services include support from the care team which includes your Nurse Coordinator, Clinical Social Worker, or Pharmacist.  The Care Coordination team is here to help remove barriers to the health concerns and goals most important to you. Care Coordination services are voluntary, and the patient may decline or stop services at any time by request to their care team member.   Care Coordination Consent Status: Patient agreed to services and verbal consent obtained.   Follow up plan:  Telephone appointment with care coordination team member scheduled for:  07/31/2022  Encounter Outcome:  Pt. Scheduled from referral   Burman Nieves, [facility] Care Coordination Care Guide Direct Dial: [phone]

## 2022-07-31 ENCOUNTER — Other Ambulatory Visit: Payer: Self-pay | Admitting: *Deleted

## 2022-07-31 ENCOUNTER — Ambulatory Visit: Payer: Self-pay

## 2022-07-31 DIAGNOSIS — G5603 Carpal tunnel syndrome, bilateral upper limbs: Secondary | ICD-10-CM | POA: Diagnosis not present

## 2022-07-31 DIAGNOSIS — Z7985 Long-term (current) use of injectable non-insulin antidiabetic drugs: Secondary | ICD-10-CM | POA: Diagnosis not present

## 2022-07-31 DIAGNOSIS — Z79891 Long term (current) use of opiate analgesic: Secondary | ICD-10-CM | POA: Diagnosis not present

## 2022-07-31 DIAGNOSIS — Z7951 Long term (current) use of inhaled steroids: Secondary | ICD-10-CM | POA: Diagnosis not present

## 2022-07-31 DIAGNOSIS — S42202D Unspecified fracture of upper end of left humerus, subsequent encounter for fracture with routine healing: Secondary | ICD-10-CM | POA: Diagnosis not present

## 2022-07-31 DIAGNOSIS — M545 Low back pain, unspecified: Secondary | ICD-10-CM | POA: Diagnosis not present

## 2022-07-31 DIAGNOSIS — R634 Abnormal weight loss: Secondary | ICD-10-CM | POA: Diagnosis not present

## 2022-07-31 DIAGNOSIS — E1140 Type 2 diabetes mellitus with diabetic neuropathy, unspecified: Secondary | ICD-10-CM | POA: Diagnosis not present

## 2022-07-31 DIAGNOSIS — F32A Depression, unspecified: Secondary | ICD-10-CM | POA: Diagnosis not present

## 2022-07-31 DIAGNOSIS — G4733 Obstructive sleep apnea (adult) (pediatric): Secondary | ICD-10-CM | POA: Diagnosis not present

## 2022-07-31 DIAGNOSIS — E7800 Pure hypercholesterolemia, unspecified: Secondary | ICD-10-CM | POA: Diagnosis not present

## 2022-07-31 DIAGNOSIS — E119 Type 2 diabetes mellitus without complications: Secondary | ICD-10-CM | POA: Diagnosis not present

## 2022-07-31 DIAGNOSIS — M79673 Pain in unspecified foot: Secondary | ICD-10-CM | POA: Diagnosis not present

## 2022-07-31 DIAGNOSIS — N3281 Overactive bladder: Secondary | ICD-10-CM | POA: Diagnosis not present

## 2022-07-31 DIAGNOSIS — Z794 Long term (current) use of insulin: Secondary | ICD-10-CM

## 2022-07-31 DIAGNOSIS — Z9981 Dependence on supplemental oxygen: Secondary | ICD-10-CM | POA: Diagnosis not present

## 2022-07-31 DIAGNOSIS — M25562 Pain in left knee: Secondary | ICD-10-CM | POA: Diagnosis not present

## 2022-07-31 DIAGNOSIS — Z9181 History of falling: Secondary | ICD-10-CM | POA: Diagnosis not present

## 2022-07-31 DIAGNOSIS — S42302D Unspecified fracture of shaft of humerus, left arm, subsequent encounter for fracture with routine healing: Secondary | ICD-10-CM | POA: Diagnosis not present

## 2022-07-31 DIAGNOSIS — M542 Cervicalgia: Secondary | ICD-10-CM | POA: Diagnosis not present

## 2022-07-31 MED ORDER — EASY COMFORT PEN NEEDLES 31G X 5 MM MISC: 0 refills | Status: AC

## 2022-07-31 MED ORDER — LANTUS SOLOSTAR 100 UNIT/ML ~~LOC~~ SOPN: 40.0000 [IU] | PEN_INJECTOR | Freq: Two times a day (BID) | SUBCUTANEOUS | 0 refills | Status: DC

## 2022-07-31 NOTE — Patient Instructions (Signed)
Visit Information  Thank you for taking time to visit with me today. Please don't hesitate to contact me if I can be of assistance to you.   Following are the goals we discussed today:   Goals Addressed             This Visit's Progress    COMPLETED: Care Coordination Activities       Care Coordination Interventions: Contacted the patient to review food and transportation resources Discussed the patients daughter in law assists with transportation but she would like other resources for when her daughter in law is unavailable Determined the patient is aware of her health plan transportation benefit but does not utilize due to them no showing once Verbal and written education provided on [facility] transportation advising the patient to contact this resource for assistance when needed Reviewed the patient is knowledgeable of [employer] food benefit and also has access to food and nutrition services Encouraged the patient to utilize [employer] as much as possible to assist with food costs         If you are experiencing a Mental Health or Behavioral Health Crisis or need someone to talk to, please call the [facility]: [phone]  Patient verbalizes understanding of instructions and care plan provided today and agrees to view in MyChart. Active MyChart status and patient understanding of how to access instructions and care plan via MyChart confirmed with patient.     No further follow up required: Please contact [facility] as needed for transportation assistance.  Bevelyn Ngo, BSW, CDP Social Worker, Certified Dementia Practitioner [facility] Care Management  Care Coordination [phone]

## 2022-07-31 NOTE — Patient Outreach (Signed)
  Care Coordination   Follow Up Visit Note   07/31/2022 Name: EYLIN PONTARELLI MRN: [id number] DOB: 1970/11/03  Samuel [name] is a 51 y.o. year old female who sees Raliegh Ip, DO for primary care. I spoke with  Samuel [name] by phone today.  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  Food and transportation resources    Goals Addressed             This Visit's Progress    COMPLETED: Care Coordination Activities       Care Coordination Interventions: Contacted the patient to review food and transportation resources Discussed the patients daughter in law assists with transportation but she would like other resources for when her daughter in law is unavailable Determined the patient is aware of her health plan transportation benefit but does not utilize due to them no showing once Verbal and written education provided on [facility] transportation advising the patient to contact this resource for assistance when needed Reviewed the patient is knowledgeable of [employer] food benefit and also has access to food and nutrition services Encouraged the patient to utilize [employer] as much as possible to assist with food costs         SDOH assessments and interventions completed:  No     Care Coordination Interventions Activated:  Yes  Care Coordination Interventions:  Yes, provided   Follow up plan: No further intervention required.   Encounter Outcome:  Pt. Visit Completed   Bevelyn Ngo, BSW, CDP Social Worker, Certified Dementia Practitioner [facility] Care Management  Care Coordination [phone]

## 2022-08-01 ENCOUNTER — Telehealth: Payer: Self-pay | Admitting: *Deleted

## 2022-08-04 DIAGNOSIS — F32A Depression, unspecified: Secondary | ICD-10-CM | POA: Diagnosis not present

## 2022-08-04 DIAGNOSIS — Z9981 Dependence on supplemental oxygen: Secondary | ICD-10-CM | POA: Diagnosis not present

## 2022-08-04 DIAGNOSIS — Z7951 Long term (current) use of inhaled steroids: Secondary | ICD-10-CM | POA: Diagnosis not present

## 2022-08-04 DIAGNOSIS — N3281 Overactive bladder: Secondary | ICD-10-CM | POA: Diagnosis not present

## 2022-08-04 DIAGNOSIS — S42202D Unspecified fracture of upper end of left humerus, subsequent encounter for fracture with routine healing: Secondary | ICD-10-CM | POA: Diagnosis not present

## 2022-08-04 DIAGNOSIS — R634 Abnormal weight loss: Secondary | ICD-10-CM | POA: Diagnosis not present

## 2022-08-04 DIAGNOSIS — E119 Type 2 diabetes mellitus without complications: Secondary | ICD-10-CM | POA: Diagnosis not present

## 2022-08-04 DIAGNOSIS — Z794 Long term (current) use of insulin: Secondary | ICD-10-CM | POA: Diagnosis not present

## 2022-08-04 DIAGNOSIS — S42302D Unspecified fracture of shaft of humerus, left arm, subsequent encounter for fracture with routine healing: Secondary | ICD-10-CM | POA: Diagnosis not present

## 2022-08-04 DIAGNOSIS — E7800 Pure hypercholesterolemia, unspecified: Secondary | ICD-10-CM | POA: Diagnosis not present

## 2022-08-04 DIAGNOSIS — Z79891 Long term (current) use of opiate analgesic: Secondary | ICD-10-CM | POA: Diagnosis not present

## 2022-08-04 DIAGNOSIS — Z9181 History of falling: Secondary | ICD-10-CM | POA: Diagnosis not present

## 2022-08-04 DIAGNOSIS — Z7985 Long-term (current) use of injectable non-insulin antidiabetic drugs: Secondary | ICD-10-CM | POA: Diagnosis not present

## 2022-08-05 DIAGNOSIS — J449 Chronic obstructive pulmonary disease, unspecified: Secondary | ICD-10-CM | POA: Diagnosis not present

## 2022-08-09 ENCOUNTER — Ambulatory Visit (INDEPENDENT_AMBULATORY_CARE_PROVIDER_SITE_OTHER): Payer: Medicare Other

## 2022-08-09 VITALS — Ht 68.0 in | Wt 310.0 lb

## 2022-08-09 DIAGNOSIS — E7800 Pure hypercholesterolemia, unspecified: Secondary | ICD-10-CM | POA: Diagnosis not present

## 2022-08-09 DIAGNOSIS — Z79891 Long term (current) use of opiate analgesic: Secondary | ICD-10-CM | POA: Diagnosis not present

## 2022-08-09 DIAGNOSIS — F32A Depression, unspecified: Secondary | ICD-10-CM | POA: Diagnosis not present

## 2022-08-09 DIAGNOSIS — E119 Type 2 diabetes mellitus without complications: Secondary | ICD-10-CM | POA: Diagnosis not present

## 2022-08-09 DIAGNOSIS — Z9181 History of falling: Secondary | ICD-10-CM | POA: Diagnosis not present

## 2022-08-09 DIAGNOSIS — Z0000 Encounter for general adult medical examination without abnormal findings: Secondary | ICD-10-CM | POA: Diagnosis not present

## 2022-08-09 DIAGNOSIS — S42302D Unspecified fracture of shaft of humerus, left arm, subsequent encounter for fracture with routine healing: Secondary | ICD-10-CM | POA: Diagnosis not present

## 2022-08-09 DIAGNOSIS — S42202D Unspecified fracture of upper end of left humerus, subsequent encounter for fracture with routine healing: Secondary | ICD-10-CM | POA: Diagnosis not present

## 2022-08-09 DIAGNOSIS — Z7985 Long-term (current) use of injectable non-insulin antidiabetic drugs: Secondary | ICD-10-CM | POA: Diagnosis not present

## 2022-08-09 DIAGNOSIS — N3281 Overactive bladder: Secondary | ICD-10-CM | POA: Diagnosis not present

## 2022-08-09 DIAGNOSIS — Z7951 Long term (current) use of inhaled steroids: Secondary | ICD-10-CM | POA: Diagnosis not present

## 2022-08-09 DIAGNOSIS — Z9981 Dependence on supplemental oxygen: Secondary | ICD-10-CM | POA: Diagnosis not present

## 2022-08-09 DIAGNOSIS — Z794 Long term (current) use of insulin: Secondary | ICD-10-CM | POA: Diagnosis not present

## 2022-08-09 DIAGNOSIS — R634 Abnormal weight loss: Secondary | ICD-10-CM | POA: Diagnosis not present

## 2022-08-09 NOTE — Progress Notes (Signed)
Subjective:   Aimee Phillips is a 51 y.o. female who presents for Medicare Annual (Subsequent) preventive examination.  I connected with  MICHILLE MCELRATH on 08/09/22 by a audio enabled telemedicine application and verified that I am speaking with the correct person using two identifiers.  Patient Location: Home  Provider Location: Home Office  I discussed the limitations of evaluation and management by telemedicine. The patient expressed understanding and agreed to proceed.  Review of Systems     Cardiac Risk Factors include: advanced age (>33mn, >>86women);sedentary lifestyle;hypertension;diabetes mellitus;obesity (BMI >30kg/m2)     Objective:    Today's Vitals   08/09/22 0953  Weight: (!) 310 lb (140.6 kg)  Height: _0  (1.727 m)   Body mass index is 47.14 kg/m.     08/09/2022   12:16 PM 07/13/2022    6:58 PM 03/01/2022    2:07 PM 02/25/2022    5:15 PM 07/07/2021    2:39 PM 10/29/2020   10:20 AM 10/27/2020   11:38 AM  Advanced Directives  Does Patient Have a Medical Advance Directive? _1  No No  Does patient want to make changes to medical advance directive?       No - Patient declined  Would patient like information on creating a medical advance directive? Yes (MAU/Ambulatory/Procedural Areas - Information given) No - Patient declined No - Patient declined  No - Patient declined  No - Patient declined    Current Medications (verified) Outpatient Encounter Medications as of 08/09/2022  Medication Sig   blood glucose meter kit and supplies Check BS up to four times a day   Blood Glucose Monitoring Suppl DEVI Check bs up to four times a day   budesonide-formoterol (SYMBICORT) 160-4.5 MCG/ACT inhaler Inhale 2 puffs into the lungs 2 (two) times daily. (Patient taking differently: Inhale 2 puffs into the lungs 2 (two) times daily as needed (respiratory issues.).)   buPROPion (WELLBUTRIN XL) 150 MG 24 hr tablet Take 1 tablet (150 mg total) by mouth daily.    Continuous Blood Gluc Receiver (FREESTYLE LIBRE 2 READER) DEVI Use to test blood sugar continuously. DX E11.65   Continuous Blood Gluc Sensor (FREESTYLE LIBRE 2 SENSOR) MISC Use to test blood sugar continuously. Apply sensor to arm every 14 days. DX E11.65   cyclobenzaprine (FLEXERIL) 10 MG tablet Take 1 tablet (10 mg total) by mouth 3 (three) times daily as needed for muscle spasms.   dicyclomine (BENTYL) 10 MG capsule TAKE ONE CAPSULE BY MOUTH EVERY 8 HOURS AS NEEDED SPASMS (FOR ABDOMINAL PAIN)   docusate sodium (COLACE) 100 MG capsule Take 100 mg by mouth daily as needed (constipation.).   DULoxetine (CYMBALTA) 30 MG capsule TAKE ONE CAPSULE BY MOUTH ONCE DAILY WITH 60MG CAPSULES.   DULoxetine (CYMBALTA) 60 MG capsule Take 1 capsule (60 mg total) by mouth daily.   furosemide (LASIX) 40 MG tablet Take 1 tablet (40 mg total) by mouth 2 (two) times daily. (Patient taking differently: Take 40 mg by mouth 2 (two) times daily as needed for fluid.)   gabapentin (NEURONTIN) 300 MG capsule Take 900 mg by mouth in the morning, at noon, in the evening, and at bedtime.   glucose blood (ACCU-CHEK AVIVA) test strip Use to check blood sugar four times daily   insulin aspart (NOVOLOG) 100 UNIT/ML injection Inject 20 Units into the skin daily as needed for high blood sugar.   insulin glargine (LANTUS SOLOSTAR) 100 UNIT/ML Solostar Pen Inject 40 Units into the  skin 2 (two) times daily.   Insulin Pen Needle (EASY COMFORT PEN NEEDLES) 31G X 5 MM MISC UAD for insulin injection E11.65   Lancet Devices MISC Test QID, DX E11.65, Aviva Plus   levocetirizine (XYZAL) 5 MG tablet Take 1 tablet (5 mg total) by mouth every evening. For allergies   linaclotide (LINZESS) 290 MCG CAPS capsule Take 1 capsule (290 mcg total) by mouth daily before breakfast.   lisinopril (ZESTRIL) 5 MG tablet TAKE ONE TABLET BY MOUTH DAILY (MORNING)   Misc. Devices (TRANSPORT CHAIR) MISC Needs lift chair for >300lbs   omeprazole (PRILOSEC) 20 MG  capsule TAKE ONE CAPSULE BY MOUTH ONCE DAILY   oxyCODONE-acetaminophen (PERCOCET) 10-325 MG tablet Take 1 tablet by mouth every 4 (four) hours as needed for pain.   rosuvastatin (CRESTOR) 20 MG tablet TAKE ONE TABLET BY MOUTH DAILY (BEDTIME)   Semaglutide, 2 MG/DOSE, 8 MG/3ML SOPN Inject 2 mg as directed once a week. (Patient taking differently: Inject 2 mg as directed every Tuesday.)   solifenacin (VESICARE) 10 MG tablet TAKE ONE TABLET BY MOUTH ONCE DAILY   SPIRIVA HANDIHALER 18 MCG inhalation capsule INHALE ONE PUFF BY MOUTH DAILY. (Patient taking differently: Place 18 mcg into inhaler and inhale daily as needed (asthma).)   topiramate (TOPAMAX) 100 MG tablet Take 100 mg by mouth in the morning.   benzonatate (TESSALON PERLES) 100 MG capsule Take 1 capsule (100 mg total) by mouth 3 (three) times daily as needed for cough.   No facility-administered encounter medications on file as of 08/09/2022.    Allergies (verified) Patient has no known allergies.   History: Past Medical History:  Diagnosis Date   Asthma    Bipolar affect, depressed ([place])    Cellulitis    COPD (chronic obstructive pulmonary disease) ([place])    Depression    Diabetes mellitus without complication (HCC)    Type II   PE (pulmonary embolism)    PONV (postoperative nausea and vomiting)    Pulmonary edema    Past Surgical History:  Procedure Laterality Date   CESAREAN SECTION     CHOLECYSTECTOMY     ESOPHAGOGASTRODUODENOSCOPY (EGD) WITH PROPOFOL N/A 10/29/2020   Procedure: ESOPHAGOGASTRODUODENOSCOPY (EGD) WITH PROPOFOL;  Surgeon: Harvel Quale, MD;  [facility];  Service: Gastroenterology;  Laterality: N/A;  12:30   ORIF HUMERUS FRACTURE Left 03/02/2022   Procedure: OPEN REDUCTION INTERNAL FIXATION (ORIF) PROXIMAL HUMERUS FRACTURE;  Surgeon: Shona Needles, MD;  Location: [place];  Service: Orthopedics;  Laterality: Left;   TUBAL LIGATION     Family History  Problem Relation Age of Onset    Cancer Mother        breast   Heart failure Father        died of AMI   Cancer Sister    Diabetes Brother    Heart disease Sister    Social History   Socioeconomic History   Marital status: Married    Spouse name: Haivyn Oravec   Number of children: 3   Years of education: 12   Highest education level: High school graduate  Occupational History   Occupation: Disabled  Tobacco Use   Smoking status: Never    Passive exposure: Never   Smokeless tobacco: Never  Vaping Use   Vaping Use: Never used  Substance and Sexual Activity   Alcohol use: No   Drug use: No   Sexual activity: Not Currently    Partners: Male  Other Topics Concern   Not  on file  Social History Narrative   Not on file   Social Determinants of Health   Financial Resource Strain: Low Risk  (08/09/2022)   Overall Financial Resource Strain (CARDIA)    Difficulty of Paying Living Expenses: Not hard at all  Food Insecurity: Food Insecurity Present (08/09/2022)   Hunger Vital Sign    Worried About Running Out of Food in the Last Year: Sometimes true    Ran Out of Food in the Last Year: Never true  Transportation Needs: Unmet Transportation Needs (08/09/2022)   PRAPARE - Hydrologist (Medical): Yes    Lack of Transportation (Non-Medical): No  Physical Activity: Inactive (08/09/2022)   Exercise Vital Sign    Days of Exercise per Week: 0 days    Minutes of Exercise per Session: 0 min  Stress: No Stress Concern Present (08/09/2022)   [place]    Feeling of Stress : Not at all  Social Connections: [place] (08/09/2022)   Social Connection and Isolation Panel [NHANES]    Frequency of Communication with Friends and Family: Three times a week    Frequency of Social Gatherings with Friends and Family: Three times a week    Attends Religious Services: More than 4 times per year    Active Member of Clubs or  Organizations: Yes    Attends Archivist Meetings: 1 to 4 times per year    Marital Status: Married    Tobacco Counseling Counseling given: Not Answered   Clinical Intake:  Pre-visit preparation completed: Yes        Nutritional Status: BMI > 30  Obese Diabetes: Yes CBG done?: No (Patient reports as 208 this morning) Did pt. bring in CBG monitor from home?: No  How often do you need to have someone help you when you read instructions, pamphlets, or other written materials from your doctor or pharmacy?: 1 - Never  Diabetic?Yes Nutrition Risk Assessment:  Has the patient had any N/V/D within the last 2 months?  No  Does the patient have any non-healing wounds?  No  Has the patient had any unintentional weight loss or weight gain?  No   Diabetes:  Is the patient diabetic?  Yes  If diabetic, was a CBG obtained today?  No  Did the patient bring in their glucometer from home?  No  How often do you monitor your CBG's? As directed.   Financial Strains and Diabetes Management:  Are you having any financial strains with the device, your supplies or your medication? No .  Does the patient want to be seen by Chronic Care Management for management of their diabetes?   Currently followed  Would the patient like to be referred to a Nutritionist or for Diabetic Management?  No   Diabetic Exams:  Diabetic Eye Exam: Completed 06/22/22 Diabetic Foot Exam: Completed 05/22/22    Interpreter Needed?: No  Information entered by :: Denman George LPN   Activities of Daily Living    08/09/2022   12:16 PM 07/13/2022    6:56 PM  In your present state of health, do you have any difficulty performing the following activities:  Hearing? 0 0  Vision? 0 0  Difficulty concentrating or making decisions? 0 0  Walking or climbing stairs? 1 1  Comment  Unsteady Balance/Gait  Dressing or bathing? 0 1  Comment  Husband Assists  Doing errands, shopping? 1 1  Comment  Husband  Assists  Preparing Food and eating ? N N  Using the Toilet? N N  In the past six months, have you accidently leaked urine? N N  Do you have problems with loss of bowel control? N N  Managing your Medications? N N  Managing your Finances? N N  Housekeeping or managing your Housekeeping? Tempie Donning  Comment  Husband Assists    Patient Care Team: [name], DO as PCP - General (Family Medicine) Lavera Guise, [facility] as Pharmacist (Family Medicine) Deloria Lair, NP as [place] Management [place], Maree Erie, LCSW as Social Worker (Licensed Clinical Social Worker)  Indicate any recent [employer] you may have received from other than [facility] providers in the past year (date may be approximate).     Assessment:   This is a routine wellness examination for Aimee Phillips.  Hearing/Vision screen Hearing Screening - Comments:: No concerns  Vision Screening - Comments:: Wears rx glasses - up to date with routine eye exams   Dietary issues and exercise activities discussed: Current Exercise Habits: The patient does not participate in regular exercise at present   Goals Addressed             This Visit's Progress    Patient Stated       Start psychotherapy services        Depression Screen    08/09/2022   12:12 PM 07/13/2022    6:43 PM 05/22/2022   11:11 AM 01/16/2022   10:33 AM 12/05/2021    2:01 PM 10/07/2021    2:51 PM 07/07/2021    2:42 PM  PHQ 2/9 Scores  PHQ - 2 Score _0 0 2 1  PHQ- 9 Score _1 Fall Risk    08/09/2022    9:55 AM 07/13/2022    6:55 PM 05/22/2022   11:11 AM 01/16/2022   10:33 AM 12/05/2021    2:01 PM  Fall Risk   Falls in the past year? _2 0  Number falls in past yr: _3 0   Injury with Fall? _4 Risk for fall due to : History of fall(s);Impaired mobility History of fall(s);Impaired balance/gait;Impaired mobility  History of fall(s)   Follow up Education provided;Falls prevention discussed;Falls  evaluation completed Falls evaluation completed;Education provided;Falls prevention discussed Falls prevention discussed Education provided     FALL RISK PREVENTION PERTAINING TO THE HOME:  Any stairs in or around the home? Yes  If so, are there any without handrails? Yes  Home free of loose throw rugs in walkways, pet beds, electrical cords, etc? Yes  Adequate lighting in your home to reduce risk of falls? Yes   ASSISTIVE DEVICES UTILIZED TO PREVENT FALLS:  Life alert? Yes  Use of a cane, walker or w/c? Yes  Grab bars in the bathroom? Yes  Shower chair or bench in shower? Yes  Elevated toilet seat or a handicapped toilet? Yes   TIMED UP AND GO:  Was the test performed? No .  Length of time to ambulate 10 feet: telephonic visit   Cognitive Function:        08/09/2022   12:16 PM 07/07/2021    2:41 PM 04/02/2019    3:37 PM  6CIT Screen  What Year? 0 points 0 points 0 points  What month? 0 points 0 points 0 points  What time? 0 points 0 points 0 points  Count back  from 20 0 points 0 points 0 points  Months in reverse 0 points 0 points 0 points  Repeat phrase 0 points 0 points 0 points  Total Score 0 points 0 points 0 points    Immunizations Immunization History  Administered Date(s) Administered   Influenza Inj Mdck Quad Pf 08/31/2018   Influenza Split 08/16/2013, 08/11/2018, 06/04/2019   Influenza,inj,Quad PF,6+ Mos 07/28/2020, 10/07/2021   Influenza-Unspecified 08/31/2018   Pneumococcal Polysaccharide-23 12/03/2019   Tdap 02/14/2011   Zoster Recombinat (Shingrix) 05/22/2022    TDAP status: Up to date  Flu Vaccine status: Due, Education has been provided regarding the importance of this vaccine. Advised may receive this vaccine at local pharmacy or Health Dept. Aware to provide a copy of the vaccination record if obtained from local pharmacy or Health Dept. Verbalized acceptance and understanding.  Pneumococcal vaccine status: Up to date  Covid-19 vaccine  status: Information provided on how to obtain vaccines.   Qualifies for Shingles Vaccine? Yes   Zostavax completed No   Shingrix Completed?: No.    Education has been provided regarding the importance of this vaccine. Patient has been advised to call insurance company to determine out of pocket expense if they have not yet received this vaccine. Advised may also receive vaccine at local pharmacy or Health Dept. Verbalized acceptance and understanding.  Screening Tests Health Maintenance  Topic Date Due   COVID-19 Vaccine (1) Never done   Diabetic kidney evaluation - Urine ACR  Never done   PAP SMEAR-Modifier  10/22/2020   Zoster Vaccines- Shingrix (2 of 2) 07/17/2022   INFLUENZA VACCINE  12/10/2022 (Originally 04/11/2022)   MAMMOGRAM  01/17/2023 (Originally 09/05/2021)   COLONOSCOPY (Pts 45-35yr Insurance coverage will need to be confirmed)  01/17/2023 (Originally 09/05/2016)   Hepatitis C Screening  01/17/2023 (Originally [birth year]/1989)   HEMOGLOBIN A1C  11/20/2022   Diabetic kidney evaluation - GFR measurement  05/23/2023   FOOT EXAM  05/23/2023   OPHTHALMOLOGY EXAM  06/23/2023   Medicare Annual Wellness (AWV)  08/10/2023   HIV Screening  Completed   HPV VACCINES  Aged Out    Health Maintenance  Health Maintenance Due  Topic Date Due   COVID-19 Vaccine (1) Never done   Diabetic kidney evaluation - Urine ACR  Never done   PAP SMEAR-Modifier  10/22/2020   Zoster Vaccines- Shingrix (2 of 2) 07/17/2022    Colorectal cancer screening: Referral to GI placed (patient declines at this time). Pt aware the office will call re: appt.  Mammogram status: No longer required due to patient declines at this time .   Lung Cancer Screening: (Low Dose CT Chest recommended if Age 185-80years, 30 pack-year currently smoking OR have quit w/in 15years.) does not qualify.   Lung Cancer Screening Referral: n/a  Additional Screening:  Hepatitis C Screening: does qualify; Completed at next office  visit   Vision Screening: Recommended annual ophthalmology exams for early detection of glaucoma and other disorders of the eye. Is the patient up to date with their annual eye exam?  Yes  Who is the provider or what is the name of the office in which the patient attends annual eye exams? In office  If pt is not established with a provider, would they like to be referred to a provider to establish care? No .   Dental Screening: Recommended annual dental exams for proper oral hygiene  Community Resource Referral / Chronic Care Management: CRR required this visit?   Patient currently enrolled   CCM  required this visit?   Patient currently enrolled      Plan:     I have personally reviewed and noted the following in the patient's chart:   Medical and social history Use of alcohol, tobacco or illicit drugs  Current medications and supplements including opioid prescriptions. Patient is currently taking opioid prescriptions. Information provided to patient regarding non-opioid alternatives. Patient advised to discuss non-opioid treatment plan with their provider. Functional ability and status Nutritional status Physical activity Advanced directives List of other physicians Hospitalizations, surgeries, and ER visits in previous 12 months Vitals Screenings to include cognitive, depression, and falls Referrals and appointments  In addition, I have reviewed and discussed with patient certain preventive protocols, quality metrics, and best practice recommendations. A written personalized care plan for preventive services as well as general preventive health recommendations were provided to patient.     Vanetta Mulders, [place]   [id number]   Due to this being a virtual visit, the after visit summary with patients personalized plan was offered to patient via mail or my-chart. Patient was mailed a copy of AVS  Nurse Notes: Patient asking for follow up on referral the psychiatry for  virtual services.  I will look further into this.  Also interested in if home health could give her a flu shot.

## 2022-08-09 NOTE — Patient Instructions (Signed)
[name] , Thank you for taking time to come for your Medicare Wellness Visit. I appreciate your ongoing commitment to your health goals. Please review the following plan we discussed and let me know if I can assist you in the future.   These are the goals we discussed:  Goals       I need my teeth pulled so I can get dentures.      Care Coordination Interventions: Patient interviewed about adult health maintenance status including  Regular Dental Care    NP made 2nd call to Dr. Hoyt Koch, DDS, leaving another message requesting an appt for pt.      I would like the light weight Inogen O2 machine to make life easier.      Care Coordination Interventions: Provided patient with basic written and verbal COPD education on self care/management/and exacerbation prevention Provided instruction about proper use of medications used for management of COPD including inhalers Advised patient to self assesses COPD action plan zone and make appointment with provider if in the yellow zone for 48 hours without improvement Provided education about and advised patient to utilize infection prevention strategies to reduce risk of respiratory infection  Patient has a pulmonology appt scheduled for Dec 20th to be evaluated to see if she is eligible to use an Inogen Oxygen unit.        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  Task-centered strategies employed. Encouraged verbalization of feelings.    Emotional support provided. Caregiver stress acknowledged. Continue to encourage reinstatement of transportation services through [facility]). CSW collaboration with [facility], to place referral for transportation services, agencies and resources. CSW collaboration with [facility], to place referral for food and nutritional services, agencies and resources. CSW  collaboration with Glass blower/designer at [employer] [phone]), to discuss installation of handicapped accessible wheelchair ramp.  ~ HIPAA compliant messages left on voicemail, as CSW continues to await a return call. Continue to utilize new power wheelchair, for mobility and safety purposes.   [address] [phone]), to obtain financial assistance with installation of handicapped accessible wheelchair ramp. Contact Americans with Disabilities ACT (# 1.949-736-0319 or # [phone]), to inquire about legal rights as a disabled individual.       Patient Stated      07/07/2021 AWV Goal: Fall Prevention  Over the next year, patient will decrease their risk for falls by: Using assistive devices, such as a cane or walker, as needed Identifying fall risks within their home and correcting them by: Removing throw rugs Adding handrails to stairs or ramps Removing clutter and keeping a clear pathway throughout the home Increasing light, especially at night Adding shower handles/bars Raising toilet seat Identifying potential personal risk factors for falls: Medication side effects Incontinence/urgency Vestibular dysfunction Hearing loss Musculoskeletal disorders Neurological disorders Orthostatic hypotension        Patient Stated      Start psychotherapy services         This is a list of the screening recommended for you and due dates:  Health Maintenance  Topic Date Due   COVID-19 Vaccine (1) Never done   Yearly kidney health urinalysis for diabetes  Never done   Pap Smear  10/22/2020   Zoster (Shingles) Vaccine (2 of 2) 07/17/2022   Flu Shot  12/10/2022*   Mammogram  01/17/2023*   Colon Cancer Screening  01/17/2023*  Hepatitis C Screening: USPSTF Recommendation to screen - Ages 18-79 yo.  01/17/2023*   Hemoglobin A1C  11/20/2022   Yearly kidney function blood test for diabetes  05/23/2023   Complete foot exam   05/23/2023   Eye  exam for diabetics  06/23/2023   Medicare Annual Wellness Visit  08/10/2023   HIV Screening  Completed   HPV Vaccine  Aged Out  *Topic was postponed. The date shown is not the original due date.    Advanced directives: Advance directive discussed with you today. I have provided a copy for you to complete at home and have notarized. Once this is complete please bring a copy in to our office so we can scan it into your chart.   Conditions/risks identified: Aim for 30 minutes of exercise or brisk walking, 6-8 glasses of water, and 5 servings of fruits and vegetables each day.   Next appointment: Follow up in one year for your annual wellness visit.   Preventive Care 40-64 Years, Female Preventive care refers to lifestyle choices and visits with your health care provider that can promote health and wellness. What does preventive care include? A yearly physical exam. This is also called an annual well check. Dental exams once or twice a year. Routine eye exams. Ask your health care provider how often you should have your eyes checked. Personal lifestyle choices, including: Daily care of your teeth and gums. Regular physical activity. Eating a healthy diet. Avoiding tobacco and drug use. Limiting alcohol use. Practicing safe sex. Taking low-dose aspirin daily starting at age 47. Taking vitamin and mineral supplements as recommended by your health care provider. What happens during an annual well check? The services and screenings done by your health care provider during your annual well check will depend on your age, overall health, lifestyle risk factors, and family history of disease. Counseling  Your health care provider may ask you questions about your: Alcohol use. Tobacco use. Drug use. Emotional well-being. Home and relationship well-being. Sexual activity. Eating habits. Work and work Statistician. Method of birth control. Menstrual cycle. Pregnancy history. Screening   You may have the following tests or measurements: Height, weight, and BMI. Blood pressure. Lipid and cholesterol levels. These may be checked every 5 years, or more frequently if you are over 72 years old. Skin check. Lung cancer screening. You may have this screening every year starting at age 51 if you have a 30-pack-year history of smoking and currently smoke or have quit within the past 15 years. Fecal occult blood test (FOBT) of the stool. You may have this test every year starting at age 23. Flexible sigmoidoscopy or colonoscopy. You may have a sigmoidoscopy every 5 years or a colonoscopy every 10 years starting at age 38. Hepatitis C blood test. Hepatitis B blood test. Sexually transmitted disease (STD) testing. Diabetes screening. This is done by checking your blood sugar (glucose) after you have not eaten for a while (fasting). You may have this done every 1-3 years. Mammogram. This may be done every 1-2 years. Talk to your health care provider about when you should start having regular mammograms. This may depend on whether you have a family history of breast cancer. BRCA-related cancer screening. This may be done if you have a family history of breast, ovarian, tubal, or peritoneal cancers. Pelvic exam and Pap test. This may be done every 3 years starting at age 50. Starting at age 67, this may be done every 5 years if you have a Pap  test in combination with an HPV test. Bone density scan. This is done to screen for osteoporosis. You may have this scan if you are at high risk for osteoporosis. Discuss your test results, treatment options, and if necessary, the need for more tests with your health care provider. Vaccines  Your health care provider may recommend certain vaccines, such as: Influenza vaccine. This is recommended every year. Tetanus, diphtheria, and acellular pertussis (Tdap, Td) vaccine. You may need a Td booster every 10 years. Zoster vaccine. You may need this after  age 71. Pneumococcal 13-valent conjugate (PCV13) vaccine. You may need this if you have certain conditions and were not previously vaccinated. Pneumococcal polysaccharide (PPSV23) vaccine. You may need one or two doses if you smoke cigarettes or if you have certain conditions. Talk to your health care provider about which screenings and vaccines you need and how often you need them. This information is not intended to replace advice given to you by your health care provider. Make sure you discuss any questions you have with your health care provider. Document Released: 09/24/2015 Document Revised: 05/17/2016 Document Reviewed: 06/29/2015 Elsevier Interactive Patient Education  2017 [employer] Prevention in the Home Falls can cause injuries. They can happen to people of all ages. There are many things you can do to make your home safe and to help prevent falls. What can I do on the outside of my home? Regularly fix the edges of walkways and driveways and fix any cracks. Remove anything that might make you trip as you walk through a door, such as a raised step or threshold. Trim any bushes or trees on the path to your home. Use bright outdoor lighting. Clear any walking paths of anything that might make someone trip, such as rocks or tools. Regularly check to see if handrails are loose or broken. Make sure that both sides of any steps have handrails. Any raised decks and porches should have guardrails on the edges. Have any leaves, snow, or ice cleared regularly. Use sand or salt on walking paths during winter. Clean up any spills in your garage right away. This includes oil or grease spills. What can I do in the bathroom? Use night lights. Install grab bars by the toilet and in the tub and shower. Do not use towel bars as grab bars. Use non-skid mats or decals in the tub or shower. If you need to sit down in the shower, use a plastic, non-slip stool. Keep the floor dry. Clean  up any water that spills on the floor as soon as it happens. Remove soap buildup in the tub or shower regularly. Attach bath mats securely with double-sided non-slip rug tape. Do not have throw rugs and other things on the floor that can make you trip. What can I do in the bedroom? Use night lights. Make sure that you have a light by your bed that is easy to reach. Do not use any sheets or blankets that are too big for your bed. They should not hang down onto the floor. Have a firm chair that has side arms. You can use this for support while you get dressed. Do not have throw rugs and other things on the floor that can make you trip. What can I do in the kitchen? Clean up any spills right away. Avoid walking on wet floors. Keep items that you use a lot in easy-to-reach places. If you need to reach something above you, use a strong  step stool that has a grab bar. Keep electrical cords out of the way. Do not use floor polish or wax that makes floors slippery. If you must use wax, use non-skid floor wax. Do not have throw rugs and other things on the floor that can make you trip. What can I do with my stairs? Do not leave any items on the stairs. Make sure that there are handrails on both sides of the stairs and use them. Fix handrails that are broken or loose. Make sure that handrails are as long as the stairways. Check any carpeting to make sure that it is firmly attached to the stairs. Fix any carpet that is loose or worn. Avoid having throw rugs at the top or bottom of the stairs. If you do have throw rugs, attach them to the floor with carpet tape. Make sure that you have a light switch at the top of the stairs and the bottom of the stairs. If you do not have them, ask someone to add them for you. What else can I do to help prevent falls? Wear shoes that: Do not have high heels. Have rubber bottoms. Are comfortable and fit you well. Are closed at the toe. Do not wear sandals. If you  use a stepladder: Make sure that it is fully opened. Do not climb a closed stepladder. Make sure that both sides of the stepladder are locked into place. Ask someone to hold it for you, if possible. Clearly mark and make sure that you can see: Any grab bars or handrails. First and last steps. Where the edge of each step is. Use tools that help you move around (mobility aids) if they are needed. These include: Canes. Walkers. Scooters. Crutches. Turn on the lights when you go into a dark area. Replace any light bulbs as soon as they burn out. Set up your furniture so you have a clear path. Avoid moving your furniture around. If any of your floors are uneven, fix them. If there are any pets around you, be aware of where they are. Review your medicines with your doctor. Some medicines can make you feel dizzy. This can increase your chance of falling. Ask your doctor what other things that you can do to help prevent falls. This information is not intended to replace advice given to you by your health care provider. Make sure you discuss any questions you have with your health care provider. Document Released: 06/24/2009 Document Revised: 02/03/2016 Document Reviewed: 10/02/2014 Elsevier Interactive Patient Education  2017 [employer].

## 2022-08-10 ENCOUNTER — Ambulatory Visit: Payer: Self-pay | Admitting: *Deleted

## 2022-08-10 ENCOUNTER — Encounter: Payer: Self-pay | Admitting: *Deleted

## 2022-08-10 NOTE — Patient Instructions (Signed)
Visit Information  Thank you for taking time to visit with me today. Please don't hesitate to contact me if I can be of assistance to you.   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  Solution-focused interventions developed. Task-centered strategies employed. Verbalization of feelings encouraged.    Emotional support provided. Caregiver stress acknowledged. Caregiver resources reviewed, from the list provided. Continue to encourage reinstatement of transportation services through [facility]). CSW collaboration with [employer], to place referral for transportation services, agencies and resources. CSW collaboration with [employer], to place referral for food and nutritional services, agencies and resources. CSW collaboration with Nevin Bloodgood, Print production planner at [employer] [phone]), to discuss installation of handicapped accessible wheelchair ramp.  ~ HIPAA compliant messages left on voicemail, as CSW continues to await a return call. Continue to utilize new power wheelchair, for mobility and safety purposes.   Continue to consider contacting Frederich Chick [phone]), to obtain financial assistance with installation of handicapped accessible wheelchair ramp. Continue to consider contacting Americans with Disabilities ACT (# 1.620-159-3646 or # 1.234 024 0368), to inquire about legal rights as a disabled individual. Continue to receive home health physical and occupational therapy services, through Mora Bellman, Home Health Physical Therapist with [facility] [phone]). Continue to work with Charyl Bigger, Injury Attorney with [employer] [phone]), to  pursue settlement of lawsuit filed against [facility].    ~ Fall sustained in bathroom due to standing water from leaking toilet and faucet.  ~ Work Order placed with management at [employer], one week after move-in date.  ~ Patient, husband and 6 year old son have been residing in an apartment at [facility] for 12 months.  ~ Several follow up conversations have been initiated with management at [facility], without results.  ~ As of 08/10/2022, leaking toilet and faucet have still not been repaired. ~ Required to complete 12 weeks of home health physical therapy services.  Please contact sister in [place], [place] to inquire about staying with her for several weeks, to try and improve mental health and wellness.      Our next appointment is by telephone on 08/11/2022 at 1:00 pm.  Please call the care guide team at [phone] if you need to cancel or reschedule your appointment.   If you are experiencing a Mental Health or Behavioral Health Crisis or need someone to talk to, please call the Suicide and Crisis Lifeline: 988 call the [place] National Suicide Prevention Lifeline: 603-583-9750 or TTY: (727)035-6308 TTY 418-798-4891) to talk to a trained counselor call 1-800-273-TALK (toll free, 24 hour hotline) go to [facility] [address] [place] [phone]) call the [facility] Crisis Line: [phone] call 911  Patient verbalizes understanding of instructions and care plan provided today and agrees to view in MyChart. Active MyChart status and patient understanding of how to access instructions and care plan via MyChart confirmed with patient.     Telephone follow up appointment with care management team member scheduled for:  08/11/2022 at 1:00 pm.  Danford Bad, BSW, MSW, LCSW  Licensed Clinical Social Worker  [employer]  Care Management [facility]  Mailing Address-[address], [place], [place] [address] Physical Address-[address], [place], [place] [address] Toll Free Main # [phone] Fax # [phone] Cell # [phone] [email or web address]

## 2022-08-10 NOTE — Patient Outreach (Signed)
  Care Coordination   Follow Up Visit Note   08/10/2022  Name: Aimee Phillips MRN: [id number] DOB: Nov 13, 1970  Aimee Phillips is a 51 y.o. year old female who sees Raliegh Ip, DO for primary care. I spoke with Aimee Phillips by phone today.  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   Warehouse manager.   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  Solution-focused interventions developed. Task-centered strategies employed. Verbalization of feelings encouraged.    Emotional support provided. Caregiver stress acknowledged. Caregiver resources reviewed, from the list provided. Continue to encourage reinstatement of transportation services through [facility]). CSW collaboration with [employer], to place referral for transportation services, agencies and resources. CSW collaboration with [employer], to place referral for food and nutritional services, agencies and resources. CSW collaboration with Nevin Bloodgood, Print production planner at [employer] [phone]), to discuss installation of handicapped accessible wheelchair ramp.  ~ HIPAA compliant messages left on voicemail, as CSW continues to await a return call. Continue to utilize new power wheelchair, for mobility and safety purposes.   Continue to consider contacting Frederich Chick [phone]), to obtain financial assistance with installation of handicapped accessible wheelchair ramp. Continue to consider contacting Americans with Disabilities ACT (# 1.(213)157-2479 or # 1.703-556-4771), to inquire about legal rights as a disabled individual. Continue to receive home health physical and occupational therapy services, through Mora Bellman, Home Health Physical Therapist with [facility] [phone]). Continue to work with Charyl Bigger, Injury Attorney with [employer] [phone]), to pursue settlement of lawsuit filed against [facility].    ~ Fall sustained in bathroom due to standing water from leaking toilet and faucet.  ~ Work Order placed with management at [employer], one week after move-in date.  ~ Patient, husband and 58 year old son have been residing in an apartment at [facility] for 12 months.  ~ Several follow up conversations have been initiated with management at [facility], without results.  ~ As of 08/10/2022, leaking toilet and faucet have still not been repaired. ~ Required to complete 12 weeks of home health physical therapy services.  Please contact sister in [place], [place] to inquire about staying with her for several weeks, to try and improve mental health and wellness.      SDOH assessments and interventions completed:  Yes.  Care Coordination Interventions:  Yes, provided.   Follow up plan: Follow up call scheduled for 08/11/2022 at 1:00 pm.  Encounter Outcome:  Pt. Visit Completed.   Danford Bad, BSW, MSW, LCSW  Licensed Restaurant manager, [facility]  Mailing [place] [address] [place], [place] [address] Physical Address-[address] [place], [place] [address] Toll Free Main # [phone] Fax # [phone] Cell # [phone] [email or web address]

## 2022-08-11 ENCOUNTER — Ambulatory Visit: Payer: Medicaid Other | Admitting: *Deleted

## 2022-08-12 ENCOUNTER — Encounter: Payer: Self-pay | Admitting: *Deleted

## 2022-08-12 NOTE — Patient Outreach (Signed)
  Care Coordination   Follow Up Visit Note   08/12/2022  Name: Aimee Phillips MRN: [id number] DOB: Jan 22, 1971  Aimee Phillips is a 51 y.o. year old female who sees Raliegh Ip, DO for primary care. I spoke with Aimee Phillips by phone today.  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   Warehouse manager.   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  Solution-focused interventions developed. Task-centered strategies employed. Verbalization of feelings encouraged.    Emotional support provided. Caregiver stress acknowledged. Caregiver resources reviewed, from the list provided. Continue to encourage reinstatement of transportation services through [facility]). CSW collaboration with [employer], to place referral for transportation services, agencies and resources. CSW collaboration with [employer], to place referral for food and nutritional services, agencies and resources. CSW collaboration with Nevin Bloodgood, Print production planner at [employer] [phone]), to discuss installation of handicapped accessible wheelchair ramp.  ~ HIPAA compliant messages left on voicemail, as CSW continues to await a    return call. Continue to utilize new power wheelchair, for mobility and safety purposes.   Continue to consider contacting Frederich Chick [phone]), to obtain financial assistance with installation of handicapped accessible wheelchair ramp. Continue to consider contacting Americans with Disabilities ACT (# 1.541 819 6810 or # 1.(706) 506-5108), to inquire about legal rights as a disabled individual. Continue to receive home health physical and occupational therapy services, through  Mora Bellman, Home Health Physical Therapist with [facility] [phone]). Continue to work with Charyl Bigger, Injury Attorney with [employer] [phone]), to pursue settlement of lawsuit filed against [facility].    ~ Fall sustained in bathroom due to standing water from leaking toilet and faucet. ~ Work Order placed with management at [employer], one week after move-in date. ~ Patient, husband and 34 year old son have been residing in an apartment at [employer] for 12 months. ~ Several follow up conversations have been initiated with management at [facility], without results.  ~ As of 08/10/2022, leaking toilet and faucet have still not been repaired. ~ Required to complete 12 weeks of home health physical therapy services.  Please contact sister in [place], [place] to inquire about staying with her for several weeks, to try and improve mental health and wellness.  ~ Sister unable to transport you to [place], but friend agreed to transport. ~ Please contact CSW directly (# [phone]), once you have arrived at your sister's home in [place] safely.      SDOH assessments and interventions completed:  Yes.  Care Coordination Interventions:  Yes, provided.   Follow up plan: Follow up call scheduled for 08/24/2022 at 2:00 pm.  Encounter Outcome:  Pt. Visit Completed.   Danford Bad, BSW, MSW, LCSW  Licensed Restaurant manager, fast food [facility]  Mailing [place] [address], [place], [place] [address] Physical Address-[address], [place], [place] [address] Toll Free Main # [phone] Fax # [phone] Cell # [phone] [email or web address]

## 2022-08-12 NOTE — Patient Instructions (Signed)
Visit Information  Thank you for taking time to visit with me today. Please don't hesitate to contact me if I can be of assistance to you.   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  Solution-focused interventions developed. Task-centered strategies employed. Verbalization of feelings encouraged.    Emotional support provided. Caregiver stress acknowledged. Caregiver resources reviewed, from the list provided. Continue to encourage reinstatement of transportation services through [facility]). CSW collaboration with [employer], to place referral for transportation services, agencies and resources. CSW collaboration with [employer], to place referral for food and nutritional services, agencies and resources. CSW collaboration with Woodroe Mode, Glass blower/designer at [employer] [phone]), to discuss installation of handicapped accessible wheelchair ramp.  ~ HIPAA compliant messages left on voicemail, as CSW continues to await a return call. Continue to utilize new power wheelchair, for mobility and safety purposes.   Continue to consider contacting [employer] [phone]), to obtain financial assistance with installation of handicapped accessible wheelchair ramp. Continue to consider contacting Americans with Disabilities ACT (# 1.(519)239-2533 or # 1.(401)238-6734), to inquire about legal rights as a disabled individual. Continue to receive home health physical and occupational therapy services, through Cedric Fishman, [place] Therapist with [facility] [phone]). Continue to work with Franco Nones, Injury Attorney with [place] [phone]), to  pursue settlement of lawsuit filed against [facility].    ~ Fall sustained in bathroom due to standing water from leaking toilet and faucet. ~ Work Order placed with management at [employer], one week after move-in date. ~ Patient, husband and 51 year old son have been residing in an apartment at [employer] [facility] for 12 months. ~ Several follow up conversations have been initiated with management at [facility], without results.  ~ As of 08/10/2022, leaking toilet and faucet have still not been repaired. ~ Required to complete 12 weeks of home health physical therapy services.  Please contact sister in [place], [place] to inquire about staying with her for several weeks, to try and improve mental health and wellness.  ~ Sister unable to transport you to [place], but friend agreed to transport. ~ Please contact CSW directly (# [phone]), once you have arrived at your sister's home in [place] safely.      Our next appointment is by telephone on 08/24/2022 at 2:00 pm.  Please call the care guide team at [phone] if you need to cancel or reschedule your appointment.   If you are experiencing a Mental Health or [place] or need someone to talk to, please call the Suicide and Crisis Lifeline: 988 call the [place] National Suicide Prevention Lifeline: 418 781 1527 or TTY: (820)797-7535 TTY (252)053-2793) to talk to a trained counselor call 1-800-273-TALK (toll free, 24 hour hotline) go to [facility] [address] [place] [phone]) call the [place]: 3600316211 call 911  Patient verbalizes understanding of instructions and care plan provided today and agrees to view in [place]. Active MyChart status and patient understanding of how to access instructions and care plan via MyChart confirmed with patient.      Telephone follow  up appointment with care management team member scheduled for:  08/24/2022 at 2:00 pm.  Danford Bad, BSW, MSW, LCSW  Licensed Clinical Social Worker  [employer] [facility]  Mailing [place]. [address] [place], [place] [address] Physical Address-[address], [place], [place] [address] Toll Free Main # [phone] Fax # [phone] Cell # [phone] [email or web address]

## 2022-08-16 DIAGNOSIS — Z79891 Long term (current) use of opiate analgesic: Secondary | ICD-10-CM | POA: Diagnosis not present

## 2022-08-16 DIAGNOSIS — Z9181 History of falling: Secondary | ICD-10-CM | POA: Diagnosis not present

## 2022-08-16 DIAGNOSIS — Z9981 Dependence on supplemental oxygen: Secondary | ICD-10-CM | POA: Diagnosis not present

## 2022-08-16 DIAGNOSIS — E7800 Pure hypercholesterolemia, unspecified: Secondary | ICD-10-CM | POA: Diagnosis not present

## 2022-08-16 DIAGNOSIS — R634 Abnormal weight loss: Secondary | ICD-10-CM | POA: Diagnosis not present

## 2022-08-16 DIAGNOSIS — E119 Type 2 diabetes mellitus without complications: Secondary | ICD-10-CM | POA: Diagnosis not present

## 2022-08-16 DIAGNOSIS — Z794 Long term (current) use of insulin: Secondary | ICD-10-CM | POA: Diagnosis not present

## 2022-08-16 DIAGNOSIS — N3281 Overactive bladder: Secondary | ICD-10-CM | POA: Diagnosis not present

## 2022-08-16 DIAGNOSIS — S42202D Unspecified fracture of upper end of left humerus, subsequent encounter for fracture with routine healing: Secondary | ICD-10-CM | POA: Diagnosis not present

## 2022-08-16 DIAGNOSIS — Z7951 Long term (current) use of inhaled steroids: Secondary | ICD-10-CM | POA: Diagnosis not present

## 2022-08-16 DIAGNOSIS — F32A Depression, unspecified: Secondary | ICD-10-CM | POA: Diagnosis not present

## 2022-08-16 DIAGNOSIS — S42302D Unspecified fracture of shaft of humerus, left arm, subsequent encounter for fracture with routine healing: Secondary | ICD-10-CM | POA: Diagnosis not present

## 2022-08-16 DIAGNOSIS — Z7985 Long-term (current) use of injectable non-insulin antidiabetic drugs: Secondary | ICD-10-CM | POA: Diagnosis not present

## 2022-08-18 DIAGNOSIS — Z7951 Long term (current) use of inhaled steroids: Secondary | ICD-10-CM | POA: Diagnosis not present

## 2022-08-18 DIAGNOSIS — Z9981 Dependence on supplemental oxygen: Secondary | ICD-10-CM | POA: Diagnosis not present

## 2022-08-18 DIAGNOSIS — E119 Type 2 diabetes mellitus without complications: Secondary | ICD-10-CM | POA: Diagnosis not present

## 2022-08-18 DIAGNOSIS — Z9181 History of falling: Secondary | ICD-10-CM | POA: Diagnosis not present

## 2022-08-18 DIAGNOSIS — Z794 Long term (current) use of insulin: Secondary | ICD-10-CM | POA: Diagnosis not present

## 2022-08-18 DIAGNOSIS — Z7985 Long-term (current) use of injectable non-insulin antidiabetic drugs: Secondary | ICD-10-CM | POA: Diagnosis not present

## 2022-08-18 DIAGNOSIS — E7800 Pure hypercholesterolemia, unspecified: Secondary | ICD-10-CM | POA: Diagnosis not present

## 2022-08-18 DIAGNOSIS — F32A Depression, unspecified: Secondary | ICD-10-CM | POA: Diagnosis not present

## 2022-08-18 DIAGNOSIS — Z79891 Long term (current) use of opiate analgesic: Secondary | ICD-10-CM | POA: Diagnosis not present

## 2022-08-18 DIAGNOSIS — R634 Abnormal weight loss: Secondary | ICD-10-CM | POA: Diagnosis not present

## 2022-08-18 DIAGNOSIS — S42302D Unspecified fracture of shaft of humerus, left arm, subsequent encounter for fracture with routine healing: Secondary | ICD-10-CM | POA: Diagnosis not present

## 2022-08-18 DIAGNOSIS — N3281 Overactive bladder: Secondary | ICD-10-CM | POA: Diagnosis not present

## 2022-08-18 DIAGNOSIS — S42202D Unspecified fracture of upper end of left humerus, subsequent encounter for fracture with routine healing: Secondary | ICD-10-CM | POA: Diagnosis not present

## 2022-08-22 NOTE — Progress Notes (Signed)
[facility] Quality Team Note  Name: Aimee Phillips Date of Birth: 1971-07-10 MRN: [id number] Date: 08/22/2022  [facility] Quality Team has reviewed this patient's chart, please see recommendations below:  [facility] Quality Other; (KED: Kidney Health Evaluation Gap- Patient needs Urine Albumin Creatinine Ratio Test completed for gap closure. EGFR has already been completed).

## 2022-08-23 ENCOUNTER — Telehealth: Payer: Self-pay | Admitting: *Deleted

## 2022-08-23 ENCOUNTER — Encounter: Payer: Self-pay | Admitting: *Deleted

## 2022-08-23 DIAGNOSIS — Z9181 History of falling: Secondary | ICD-10-CM | POA: Diagnosis not present

## 2022-08-23 DIAGNOSIS — S42202D Unspecified fracture of upper end of left humerus, subsequent encounter for fracture with routine healing: Secondary | ICD-10-CM | POA: Diagnosis not present

## 2022-08-23 DIAGNOSIS — Z79891 Long term (current) use of opiate analgesic: Secondary | ICD-10-CM | POA: Diagnosis not present

## 2022-08-23 DIAGNOSIS — Z7985 Long-term (current) use of injectable non-insulin antidiabetic drugs: Secondary | ICD-10-CM | POA: Diagnosis not present

## 2022-08-23 DIAGNOSIS — F32A Depression, unspecified: Secondary | ICD-10-CM | POA: Diagnosis not present

## 2022-08-23 DIAGNOSIS — N3281 Overactive bladder: Secondary | ICD-10-CM | POA: Diagnosis not present

## 2022-08-23 DIAGNOSIS — R634 Abnormal weight loss: Secondary | ICD-10-CM | POA: Diagnosis not present

## 2022-08-23 DIAGNOSIS — E7800 Pure hypercholesterolemia, unspecified: Secondary | ICD-10-CM | POA: Diagnosis not present

## 2022-08-23 DIAGNOSIS — E119 Type 2 diabetes mellitus without complications: Secondary | ICD-10-CM | POA: Diagnosis not present

## 2022-08-23 DIAGNOSIS — S42302D Unspecified fracture of shaft of humerus, left arm, subsequent encounter for fracture with routine healing: Secondary | ICD-10-CM | POA: Diagnosis not present

## 2022-08-23 DIAGNOSIS — Z7951 Long term (current) use of inhaled steroids: Secondary | ICD-10-CM | POA: Diagnosis not present

## 2022-08-23 DIAGNOSIS — Z9981 Dependence on supplemental oxygen: Secondary | ICD-10-CM | POA: Diagnosis not present

## 2022-08-23 DIAGNOSIS — Z794 Long term (current) use of insulin: Secondary | ICD-10-CM | POA: Diagnosis not present

## 2022-08-23 NOTE — Patient Outreach (Signed)
  Care Coordination   Follow Up Visit Note   08/23/2022 Name: Aimee Phillips MRN: [id number] DOB: 1971/04/23  Aimee Phillips is a 52 y.o. year old female who sees Raliegh Ip, DO for primary care. I spoke with  Aimee Phillips by phone today.  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  I want to leave my current living situation as soon as the holidays are over.    Goals Addressed             This Visit's Progress    Start psychotherapy services       Care Coordination Interventions: Solution-Focused Strategies employed:  Active listening / Reflection utilized  Emotional Support Provided Reviewed mental health medications and discussed importance of compliance: Pt has all her meds and is taking them as prescribed. Participation in counseling encouraged  Verbalization of feelings encouraged  Crisis Resource Education /information provided  Suicidal Ideation/Homicidal Ideation assessed: No suicidal ideation but states, " I feel like I'm on the brink of a nervous breakdown. Discussed self-care action plan: Avoid confrontation with anyone that makes her uneasy. Separate herself from any threats. Make a plan to leave or escape and unsafe environment. Collaborated with Danford Bad, LCSW , who was able to tell me pt psychiatrist name, Dr. Tia Masker at  [facility] [place].  NP called [facility] and found that pt missed her initial video visit to establish with her new psychiatrist. Office advised pt must call to schedule another appt and to advise her if she "no shows" again she would not have another opportunity. Called pt back provided name of provider and phone number. Requested she call today to schedule her appt and advised of office rules for no shows. Sent text message with provider information to pt.        SDOH assessments and interventions completed:  Yes   PREVIOUSLY ADDRESSED.  Care Coordination Interventions:  Yes,  provided   Follow up plan: Follow up call scheduled for JANUARY.    Encounter Outcome:  Pt. Visit Completed   Noralyn Pick C. Burgess Estelle, MSN, [facility] Gerontological Nurse Practitioner [facility] Care Management [phone]

## 2022-08-24 ENCOUNTER — Ambulatory Visit: Payer: Self-pay | Admitting: *Deleted

## 2022-08-24 ENCOUNTER — Other Ambulatory Visit: Payer: Self-pay | Admitting: Family Medicine

## 2022-08-24 ENCOUNTER — Encounter: Payer: Self-pay | Admitting: *Deleted

## 2022-08-24 DIAGNOSIS — E1159 Type 2 diabetes mellitus with other circulatory complications: Secondary | ICD-10-CM

## 2022-08-24 MED ORDER — LISINOPRIL 5 MG PO TABS: 5.0000 mg | ORAL_TABLET | Freq: Every day | ORAL | 3 refills | Status: DC

## 2022-08-24 NOTE — Patient Instructions (Signed)
Visit Information  Thank you for taking time to visit with me today. Please don't hesitate to contact me if I can be of assistance to you.   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  Solution-focused interventions developed. Task-centered strategies employed. Verbalization of feelings encouraged.    Emotional support provided. Caregiver stress acknowledged. Caregiver resources reviewed, from the list provided. Continue to encourage reinstatement of transportation services through [facility]). CSW collaboration with [employer], to place referral for transportation services, agencies and resources. CSW collaboration with [employer], to place referral for food and nutritional services, agencies and resources. CSW collaboration with Nevin Bloodgood, Print production planner at [employer] [phone]), to discuss installation of handicapped accessible wheelchair ramp.  ~ HIPAA compliant messages left on voicemail, as CSW continues to await a return call. Continue to utilize new power wheelchair, for mobility and safety purposes.   Continue to consider contacting [employer] Chick [phone]), to obtain financial assistance with installation of handicapped accessible wheelchair ramp. Continue to consider contacting Americans with Disabilities ACT (# 1.281 660 7843 or # 1.(902) 578-1532), to inquire about legal rights as a disabled individual. Continue to receive home health physical and occupational therapy services, through Mora Bellman, Home Health Physical Therapist with [facility] [phone]). Continue to work with Charyl Bigger, Injury Attorney with [employer] [phone]), to pursue settlement of lawsuit filed against [facility].    ~ Fall sustained in bathroom due to standing water from leaking toilet and faucet. ~ Work Order placed with management at [employer], one week after move-in date. ~ Patient, husband and 51 year old son have been residing in an apartment at [employer] [facility] for 12 months. ~ Several follow up conversations have been initiated with management at [facility], without results.  ~ As of 08/10/2022, leaking toilet and faucet have still not been repaired. ~ Required to complete 12 weeks of home health physical therapy services.  Please contact sister in [place], [place] to inquire about staying with her for several weeks, to try and improve mental health and wellness.  ~ Sister unable to transport you to [place], but friend agreed to transport. ~ Please contact CSW directly (# [phone]), once you have arrived at your sister's home in [place] safely.      Reduce and Manage Symptoms of Anxiety and Depression. (pt-stated)   On track     Care Coordination Interventions:  Active listening/reflection utilized.  Solution-focused interventions employed. Task-centered strategies activated. Verbalization of feelings encouraged.    Emotional and caregiver support provided. Caregiver stress acknowledged. Caregiver resources reviewed and encouraged. Feelings of frustration and anger validated. Accessed for suicidal and homicidal ideations - none present. Accessed for domestic violence - denied. Please reinstate transportation services through [employer] [phone]). CSW continued attempts at collaborating with Nevin Bloodgood, Print production planner at [employer] [phone]), to discuss installation of handicapped accessible wheelchair ramp.  ~ HIPAA compliant  messages left on voicemail, as CSW continues to await a return call. Continue to receive home health physical and occupational therapy services, through Mora Bellman, [facility] Physical Therapist with  [facility] [phone]). Continue to work with Charyl Bigger, Injury Attorney with [employer] [phone]), to pursue settlement of lawsuit filed against [facility].    ~ Fall sustained in bathroom due to standing water from leaking toilet and faucet. ~ Work Order placed with management at [employer], one week after move-in date. ~ Patient, husband and 51 year old son have been residing in an apartment at [employer] [facility] for 12 months. ~ Several follow up conversations have been initiated with management at [facility], without results.  ~ As of 08/10/2022, leaking toilet and faucet have still not been repaired. Please schedule appointment with Dr. Tia Masker, Psychiatrist with [facility] at [place] [phone]), in an effort to establish psychotherapeutic counseling services and psychotropic medication management. CSW collaboration with Primary Care Provider, Dr. Delynn Flavin [phone]), to report current psychotropic medication regimen not working effectively, per your request. CSW collaboration with Primary Care Provider, Dr. Delynn Flavin [phone]), to report current medication regimen not working effectively, per your request.       Our next appointment is by telephone on 09/08/2022 at 10:30 am.  Please call the care guide team at [phone] if you need to cancel or reschedule your appointment.   If you are experiencing a Mental Health or Behavioral Health Crisis or need someone to talk to, please call the Suicide and Crisis Lifeline: 988 call the [place] National Suicide Prevention Lifeline: 661 202 7479 or TTY:  7707283753 TTY 2623861786) to talk to a trained counselor call 1-800-273-TALK (toll free, 24 hour hotline) go to [facility] Care [address], [place] [phone]) call the [facility] Crisis Line: [phone]) [phone] call 911  Patient verbalizes understanding of instructions and care plan provided today and agrees to view in MyChart. Active MyChart status and patient understanding of how to access instructions and care plan via MyChart confirmed with patient.     Telephone follow up appointment with care management team member scheduled for:  09/08/2022 at 10:30 am.  Danford Bad, BSW, MSW, LCSW  Licensed Clinical Social Worker  [facility]  Mailing [place]. [address], [place], [place] [address] Physical Address[address], [place], [place] [address] Toll Free Main # [phone] Fax # [phone] Cell # [phone] [email or web address]

## 2022-08-24 NOTE — Patient Outreach (Signed)
Care Coordination   Follow Up Visit Note   08/24/2022  Name: Aimee Phillips MRN: [id number] DOB: January 03, 1971  Aimee Phillips is a 51 y.o. year old female who sees [name], DO for primary care. I spoke with Aimee Phillips by phone today.  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  Reduce and Manage Symptoms of Anxiety and Depression.    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  Solution-focused interventions developed. Task-centered strategies employed. Verbalization of feelings encouraged.    Emotional support provided. Caregiver stress acknowledged. Caregiver resources reviewed, from the list provided. Continue to encourage reinstatement of transportation services through [facility]). CSW collaboration with [facility] [facility], to place referral for transportation services, agencies and resources. CSW collaboration with [employer] [facility], to place referral for food and nutritional services, agencies and resources. CSW collaboration with Woodroe Mode, Glass blower/designer at [employer] [phone]), to discuss installation of handicapped accessible wheelchair ramp.  ~ HIPAA compliant messages left on voicemail, as CSW continues to await a return call. Continue to utilize new power wheelchair, for mobility and safety purposes.   Continue to consider contacting [employer] [phone]), to obtain financial assistance with installation of handicapped accessible wheelchair ramp. Continue to consider contacting Americans with Disabilities ACT (# 1.620-745-1903 or # 1.561-389-7507), to inquire about legal rights as a disabled individual. Continue to receive home health physical and occupational therapy  services, through Cedric Fishman, [place] Therapist with [facility] [phone]). Continue to work with Franco Nones, Injury Attorney with [place] [phone]), to pursue settlement of lawsuit filed against [place] [facility].    ~ Fall sustained in bathroom due to standing water from leaking toilet and faucet. ~ Work Order placed with management at [employer], one week after move-in date. ~ Patient, husband and 72 year old son have been residing in an apartment at [employer] [facility] for 12 months. ~ Several follow up conversations have been initiated with management at [facility], without results.  ~ As of 08/10/2022, leaking toilet and faucet have still not been repaired. ~ Required to complete 12 weeks of home health physical therapy services.  Please contact sister in [place], [place] to inquire about staying with her for several weeks, to try and improve mental health and wellness.  ~ Sister unable to transport you to [place], but friend agreed to transport. ~ Please contact CSW directly (# [phone]), once you have arrived at your sister's home in [place] safely.      Reduce and Manage Symptoms of Anxiety and Depression. (pt-stated)   On track     Care Coordination Interventions:  Active listening/reflection utilized.  Solution-focused interventions employed. Task-centered strategies activated. Verbalization of feelings encouraged.    Emotional and caregiver support provided. Caregiver stress acknowledged. Caregiver resources reviewed and encouraged. Feelings of frustration and anger validated. Accessed for suicidal and homicidal ideations - none present. Accessed for domestic violence - denied. Please reinstate transportation services through [employer] [phone]). CSW continued attempts at  collaborating with Woodroe Mode, Glass blower/designer at [employer] [phone]), to discuss installation of handicapped accessible wheelchair ramp.  ~  HIPAA compliant messages left on voicemail, as CSW continues to await a return call. Continue to receive home health physical and occupational therapy services, through Mora Bellman, Home Health Physical Therapist with [facility] [phone]). Continue to work with Charyl Bigger, Injury Attorney with [employer] [phone]), to pursue settlement of lawsuit filed against [facility].    ~ Fall sustained in bathroom due to standing water from leaking toilet and faucet. ~ Work Order placed with management at [employer], one week after move-in date. ~ Patient, husband and 33 year old son have been residing in an apartment at [employer] [facility] for 12 months. ~ Several follow up conversations have been initiated with management at [facility], without results.  ~ As of 08/10/2022, leaking toilet and faucet have still not been repaired. Please schedule appointment with Dr. Tia Masker, Psychiatrist with [facility] at [place] [phone]), in an effort to establish psychotherapeutic counseling services and psychotropic medication management. CSW collaboration with Primary Care Provider, Dr. Delynn Flavin [phone]), to report current psychotropic medication regimen not working effectively, per your request. CSW collaboration with Primary Care Provider, Dr. Delynn Flavin [phone]), to report current medication regimen not working effectively, per your request.       SDOH assessments and interventions completed:  Yes.  Care Coordination Interventions:  Yes, provided.   Follow up plan: Follow up call scheduled for 09/08/2022 at 10:30 am.  Encounter Outcome:   Pt. Visit Completed.   Danford Bad, BSW, MSW, LCSW  Licensed Restaurant manager, fast food [facility] System  Mailing [place] [address], [place], [place] [address] Physical Address-[address], [place], [place] [address] Toll Free Main # [phone] Fax # [phone] Cell # [phone] [email or web address]

## 2022-08-25 DIAGNOSIS — N3281 Overactive bladder: Secondary | ICD-10-CM | POA: Diagnosis not present

## 2022-08-25 DIAGNOSIS — E119 Type 2 diabetes mellitus without complications: Secondary | ICD-10-CM | POA: Diagnosis not present

## 2022-08-25 DIAGNOSIS — S42202D Unspecified fracture of upper end of left humerus, subsequent encounter for fracture with routine healing: Secondary | ICD-10-CM | POA: Diagnosis not present

## 2022-08-25 DIAGNOSIS — Z79891 Long term (current) use of opiate analgesic: Secondary | ICD-10-CM | POA: Diagnosis not present

## 2022-08-25 DIAGNOSIS — Z7951 Long term (current) use of inhaled steroids: Secondary | ICD-10-CM | POA: Diagnosis not present

## 2022-08-25 DIAGNOSIS — Z794 Long term (current) use of insulin: Secondary | ICD-10-CM | POA: Diagnosis not present

## 2022-08-25 DIAGNOSIS — Z9181 History of falling: Secondary | ICD-10-CM | POA: Diagnosis not present

## 2022-08-25 DIAGNOSIS — F32A Depression, unspecified: Secondary | ICD-10-CM | POA: Diagnosis not present

## 2022-08-25 DIAGNOSIS — Z9981 Dependence on supplemental oxygen: Secondary | ICD-10-CM | POA: Diagnosis not present

## 2022-08-25 DIAGNOSIS — R634 Abnormal weight loss: Secondary | ICD-10-CM | POA: Diagnosis not present

## 2022-08-25 DIAGNOSIS — Z7985 Long-term (current) use of injectable non-insulin antidiabetic drugs: Secondary | ICD-10-CM | POA: Diagnosis not present

## 2022-08-25 DIAGNOSIS — S42302D Unspecified fracture of shaft of humerus, left arm, subsequent encounter for fracture with routine healing: Secondary | ICD-10-CM | POA: Diagnosis not present

## 2022-08-25 DIAGNOSIS — E7800 Pure hypercholesterolemia, unspecified: Secondary | ICD-10-CM | POA: Diagnosis not present

## 2022-08-28 ENCOUNTER — Other Ambulatory Visit: Payer: Self-pay | Admitting: Family Medicine

## 2022-08-28 DIAGNOSIS — S42202D Unspecified fracture of upper end of left humerus, subsequent encounter for fracture with routine healing: Secondary | ICD-10-CM | POA: Diagnosis not present

## 2022-08-28 DIAGNOSIS — Z7951 Long term (current) use of inhaled steroids: Secondary | ICD-10-CM | POA: Diagnosis not present

## 2022-08-28 DIAGNOSIS — S42302D Unspecified fracture of shaft of humerus, left arm, subsequent encounter for fracture with routine healing: Secondary | ICD-10-CM | POA: Diagnosis not present

## 2022-08-28 DIAGNOSIS — E119 Type 2 diabetes mellitus without complications: Secondary | ICD-10-CM | POA: Diagnosis not present

## 2022-08-28 DIAGNOSIS — Z79891 Long term (current) use of opiate analgesic: Secondary | ICD-10-CM | POA: Diagnosis not present

## 2022-08-28 DIAGNOSIS — N3281 Overactive bladder: Secondary | ICD-10-CM | POA: Diagnosis not present

## 2022-08-28 DIAGNOSIS — Z9181 History of falling: Secondary | ICD-10-CM | POA: Diagnosis not present

## 2022-08-28 DIAGNOSIS — R634 Abnormal weight loss: Secondary | ICD-10-CM | POA: Diagnosis not present

## 2022-08-28 DIAGNOSIS — Z794 Long term (current) use of insulin: Secondary | ICD-10-CM | POA: Diagnosis not present

## 2022-08-28 DIAGNOSIS — Z7985 Long-term (current) use of injectable non-insulin antidiabetic drugs: Secondary | ICD-10-CM | POA: Diagnosis not present

## 2022-08-28 DIAGNOSIS — Z9981 Dependence on supplemental oxygen: Secondary | ICD-10-CM | POA: Diagnosis not present

## 2022-08-28 DIAGNOSIS — E7800 Pure hypercholesterolemia, unspecified: Secondary | ICD-10-CM | POA: Diagnosis not present

## 2022-08-28 DIAGNOSIS — F32A Depression, unspecified: Secondary | ICD-10-CM | POA: Diagnosis not present

## 2022-08-29 DIAGNOSIS — S42202D Unspecified fracture of upper end of left humerus, subsequent encounter for fracture with routine healing: Secondary | ICD-10-CM | POA: Diagnosis not present

## 2022-08-30 ENCOUNTER — Encounter: Payer: Self-pay | Admitting: *Deleted

## 2022-08-30 ENCOUNTER — Ambulatory Visit: Payer: Self-pay | Admitting: *Deleted

## 2022-08-30 ENCOUNTER — Institutional Professional Consult (permissible substitution): Payer: Medicare Other | Admitting: Pulmonary Disease

## 2022-08-30 DIAGNOSIS — E7800 Pure hypercholesterolemia, unspecified: Secondary | ICD-10-CM | POA: Diagnosis not present

## 2022-08-30 DIAGNOSIS — Z7985 Long-term (current) use of injectable non-insulin antidiabetic drugs: Secondary | ICD-10-CM | POA: Diagnosis not present

## 2022-08-30 DIAGNOSIS — R634 Abnormal weight loss: Secondary | ICD-10-CM | POA: Diagnosis not present

## 2022-08-30 DIAGNOSIS — Z794 Long term (current) use of insulin: Secondary | ICD-10-CM | POA: Diagnosis not present

## 2022-08-30 DIAGNOSIS — S42302D Unspecified fracture of shaft of humerus, left arm, subsequent encounter for fracture with routine healing: Secondary | ICD-10-CM | POA: Diagnosis not present

## 2022-08-30 DIAGNOSIS — Z79891 Long term (current) use of opiate analgesic: Secondary | ICD-10-CM | POA: Diagnosis not present

## 2022-08-30 DIAGNOSIS — E119 Type 2 diabetes mellitus without complications: Secondary | ICD-10-CM | POA: Diagnosis not present

## 2022-08-30 DIAGNOSIS — Z7951 Long term (current) use of inhaled steroids: Secondary | ICD-10-CM | POA: Diagnosis not present

## 2022-08-30 DIAGNOSIS — S42202D Unspecified fracture of upper end of left humerus, subsequent encounter for fracture with routine healing: Secondary | ICD-10-CM | POA: Diagnosis not present

## 2022-08-30 DIAGNOSIS — F32A Depression, unspecified: Secondary | ICD-10-CM | POA: Diagnosis not present

## 2022-08-30 DIAGNOSIS — N3281 Overactive bladder: Secondary | ICD-10-CM | POA: Diagnosis not present

## 2022-08-30 DIAGNOSIS — Z9181 History of falling: Secondary | ICD-10-CM | POA: Diagnosis not present

## 2022-08-30 DIAGNOSIS — Z9981 Dependence on supplemental oxygen: Secondary | ICD-10-CM | POA: Diagnosis not present

## 2022-08-30 NOTE — Patient Outreach (Signed)
Care Coordination   Follow Up Visit Note   08/30/2022  Name: Aimee Phillips MRN: [id number] DOB: 26-May-1971  Aimee Phillips is a 51 y.o. year old female who sees Raliegh Ip, DO for primary care. I spoke with Aimee Phillips by phone today.  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  Reduce and Manage Symptoms of Anxiety and Depression.    Goals Addressed               This Visit's Progress     Reduce and Manage Symptoms of Anxiety and Depression. (pt-stated)   On track     Care Coordination Interventions:  Active listening/reflection utilized.  Solution-focused interventions employed. Task-centered strategies implemented. Problem-solving solutions developed. Verbalization of feelings encouraged.    Emotional and caregiver support provided. Caregiver stress acknowledged. Caregiver resources reviewed and encouraged. Feelings of frustration and anger validated. Cognitive Behavioral Therapy performed. Accessed for suicidal and homicidal ideations - none present. Accessed for domestic violence - denied. CSW continued attempts at collaborating with Aimee Phillips, Print production planner at [employer] [phone]), to discuss installation of handicapped accessible wheelchair ramp.  ~ HIPAA compliant messages left on voicemail, as CSW continues to await a return call. Continue to receive home health physical and occupational therapy services, through Aimee Phillips, Home Health Physical Therapist with [facility] [phone]). Continue to work with Charyl Bigger, Injury Attorney with [employer] [phone]), to pursue settlement of lawsuit filed against [facility].    ~ Fall sustained in bathroom due to standing water from leaking toilet and faucet. ~ Work Order placed with management at [employer], one week after move-in date. ~ Patient, husband and 3 year old son  have been residing in an apartment at [facility] for 12 months. ~ Several follow up conversations have been initiated with management at [facility], without results.  ~ As of 08/10/2022, leaking toilet and faucet have still not been repaired. Please keep initial counseling session with Dr. Tia Masker, Psychiatrist with [facility] at [place] [phone]), scheduled on 09/24/2022 at 9:30 am, in an effort to establish psychotherapeutic counseling services and psychotropic medication management. CSW collaboration with Primary Care Provider, Dr. Delynn Flavin [phone]), to report current psychotropic medication regimen not working effectively, per your request.  ~ HIPAA compliant messages left on voicemail, as CSW continues to await a return call. If CSW is unavailable, please contact SAMHSA's (Substance Abuse and Mental Health Services Administration) [employer] [phone]). ~ SAMHSA is a free, confidential, 24/7, 365-day-a-year treatment referral and information service. ~ SAMHSA is for individuals and families facing mental and/or substance use disorders. Please review "How to Manage Depression During the Holidays", emailed to you on 08/30/2022: ~ 1. Stay Active and Get Outdoors ~ 2. Share How You're Feeling With Trusted Loved Ones ~ 3. Assess Your Relationships and Set Boundaries ~ 4. Consider Volunteering Throughout the Holiday Season ~ 5. Create a Coping Sheet or Depression Toolkit Consider implementing other self-care activities you could add to your toolkit, which may include some of the following interventions: ~ Journaling ~ Acupuncture ~ Listening to Music ~ Spirituality ~ Positive Self-Talk        SDOH assessments and interventions completed:  Yes.  Care Coordination Interventions:  Yes, provided.   Follow up plan: Follow up call scheduled for 09/08/2022 at 10:30  am.  Encounter Outcome:  Pt. Visit Completed.  Nat Christen, BSW, MSW, LCSW  Licensed Education officer, environmental Health System  Mailing [place] [address]. [address], [place], [place] [address] Physical Address-[address] [place], [place] [address] Toll Free Main # [phone] Fax # [phone] Cell # [phone] [email or web address]

## 2022-08-30 NOTE — Patient Instructions (Signed)
Visit Information  Thank you for taking time to visit with me today. Please don't hesitate to contact me if I can be of assistance to you.   Following are the goals we discussed today:   Goals Addressed               This Visit's Progress     Reduce and Manage Symptoms of Anxiety and Depression. (pt-stated)   On track     Care Coordination Interventions:  Active listening/reflection utilized.  Solution-focused interventions employed. Task-centered strategies implemented. Problem-solving solutions developed. Verbalization of feelings encouraged.    Emotional and caregiver support provided. Caregiver stress acknowledged. Caregiver resources reviewed and encouraged. Feelings of frustration and anger validated. Cognitive Behavioral Therapy performed. Accessed for suicidal and homicidal ideations - none present. Accessed for domestic violence - denied. CSW continued attempts at collaborating with Nevin Bloodgood, Print production planner at [employer] [phone]), to discuss installation of handicapped accessible wheelchair ramp.  ~ HIPAA compliant messages left on voicemail, as CSW continues to await a return call. Continue to receive home health physical and occupational therapy services, through Mora Bellman, Home Health Physical Therapist with [facility] [phone]). Continue to work with Charyl Bigger, Injury Attorney with [employer] [phone]), to pursue settlement of lawsuit filed against [facility].    ~ Fall sustained in bathroom due to standing water from leaking toilet and faucet. ~ Work Order placed with management at [employer], one week after move-in date. ~ Patient, husband and 2 year old son have been residing in an apartment at [facility] for 12 months. ~ Several follow up conversations have been initiated with management at [facility], without results.  ~ As of 08/10/2022, leaking toilet and faucet have still not been repaired. Please keep initial counseling session with Dr. Tia Masker, Psychiatrist with [facility] at [place] [phone]), scheduled on 09/24/2022 at 9:30 am, in an effort to establish psychotherapeutic counseling services and psychotropic medication management. CSW collaboration with Primary Care Provider, Dr. Delynn Flavin [phone]), to report current psychotropic medication regimen not working effectively, per your request.  ~ HIPAA compliant messages left on voicemail, as CSW continues to await a return call. If CSW is unavailable, please contact SAMHSA's (Substance Abuse and Mental Health Services Administration) [employer] [phone]). ~ SAMHSA is a free, confidential, 24/7, 365-day-a-year treatment referral and information service. ~ SAMHSA is for individuals and families facing mental and/or substance use disorders. Please review "How to Manage Depression During the Holidays", emailed to you on 08/30/2022: ~ 1. Stay Active and Get Outdoors ~ 2. Share How You're Feeling With Trusted Loved Ones ~ 3. Assess Your Relationships and Set Boundaries ~ 4. Consider Volunteering Throughout the Holiday Season ~ 5. Create a Coping Sheet or Depression Toolkit Consider implementing other self-care activities you could add to your toolkit, which may include some of the following interventions: ~ Journaling ~ Acupuncture ~ Listening to Music ~ Spirituality ~ Positive Self-Talk        Our next appointment is by telephone on 09/08/2022 at 10:30 am.  Please call the care guide team at [phone] if you need to cancel or reschedule your appointment.   If you are experiencing a Mental Health or Behavioral Health Crisis or need someone to talk to, please call the Suicide and Crisis Lifeline: 988 call the [place] National Suicide Prevention  Lifeline: 626-589-6890 or TTY: 913-190-7461 TTY 919-235-5841)  to talk to a trained counselor call 1-800-273-TALK (toll free, 24 hour hotline) go to [facility] [address] [place] [phone]) call the [facility] Crisis Line: ([phone] call 911  Patient verbalizes understanding of instructions and care plan provided today and agrees to view in MyChart. Active MyChart status and patient understanding of how to access instructions and care plan via MyChart confirmed with patient.     Telephone follow up appointment with care management team member scheduled for:  09/08/2022 at 10:30 am.  Danford Bad, BSW, MSW, LCSW  Licensed Clinical Social Worker  [facility]  Mailing [place]. [address] [place], [place] [address] Physical Address-[address] [place], [place] [address] Toll Free Main # [phone] Fax # [phone] Cell # [phone] [email or web address]

## 2022-08-31 ENCOUNTER — Ambulatory Visit (HOSPITAL_COMMUNITY): Admission: RE | Admit: 2022-08-31 | Payer: Medicare Other | Source: Ambulatory Visit

## 2022-08-31 ENCOUNTER — Other Ambulatory Visit: Payer: Self-pay | Admitting: Family Medicine

## 2022-09-04 DIAGNOSIS — J449 Chronic obstructive pulmonary disease, unspecified: Secondary | ICD-10-CM | POA: Diagnosis not present

## 2022-09-05 DIAGNOSIS — M545 Low back pain, unspecified: Secondary | ICD-10-CM | POA: Diagnosis not present

## 2022-09-05 DIAGNOSIS — Z79899 Other long term (current) drug therapy: Secondary | ICD-10-CM | POA: Diagnosis not present

## 2022-09-05 DIAGNOSIS — E559 Vitamin D deficiency, unspecified: Secondary | ICD-10-CM | POA: Diagnosis not present

## 2022-09-05 DIAGNOSIS — M129 Arthropathy, unspecified: Secondary | ICD-10-CM | POA: Diagnosis not present

## 2022-09-05 DIAGNOSIS — G894 Chronic pain syndrome: Secondary | ICD-10-CM | POA: Diagnosis not present

## 2022-09-08 ENCOUNTER — Encounter: Payer: Self-pay | Admitting: *Deleted

## 2022-09-08 ENCOUNTER — Ambulatory Visit: Payer: Self-pay | Admitting: *Deleted

## 2022-09-08 DIAGNOSIS — Z79899 Other long term (current) drug therapy: Secondary | ICD-10-CM | POA: Diagnosis not present

## 2022-09-08 NOTE — Patient Outreach (Signed)
Care Coordination   Follow Up Visit Note   09/08/2022  Name: Aimee Phillips MRN: [id number] DOB: [birth year]  Aimee Phillips is a 51 y.o. year old female who sees Raliegh Ip, DO for primary care. I spoke with Samuel [name] by phone today.  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  Reduce and Manage Symptoms of Anxiety and Depression.    Goals Addressed               This Visit's Progress     Reduce and Manage Symptoms of Anxiety and Depression. (pt-stated)   Not on track     Care Coordination Interventions:  Active listening/reflection utilized.  Solution-focused interventions implemented. Task-centered strategies employed. Problem-solving solutions activated. Verbalization of feelings encouraged.    Emotional & caregiver support provided. Caregiver stress acknowledged. Self-Enrollment in caregiver support group emphasized. Feelings of frustration & anger validated. Cognitive Behavioral Therapy performed. Client-Centered Therapy initiated. Deep Breathing Exercises, Relaxation Techniques & Mindfulness Meditation Strategies reviewed & practice encouraged daily. Assessed for suicidal & homicidal ideations - none present. Assessed for domestic violence - denied. CSW collaboration with Nevin Bloodgood, Print production planner at [employer] [phone]), to confirm inability to install handicapped accessible wheelchair ramp for personal use. Continue to work with Charyl Bigger, Injury Attorney with [employer] [phone]), to pursue settlement of lawsuit filed against [facility].   ~ Fall sustained in bathroom due to standing water from leaking toilet & faucet. ~ Work Order placed with management at [employer], one week after move-in date. ~ Patient, husband & 95 year old son have been residing in an apartment at [facility] for 12 months. ~ Several  follow up conversations have been initiated with management at [facility], without results. ~ As of 09/08/2022, leaking toilet & faucet have still not been repaired. Please keep initial counseling session with Dr. Tia Masker, Psychiatrist with [facility] at [place] [phone]), scheduled on 09/24/2022 at 9:30 am, in an effort to establish psychotherapeutic counseling services & psychotropic medication management. CSW collaboration with Primary Care Provider, Dr. Delynn Flavin [phone]), to report current psychotropic medication regimen not working effectively, per your request. ~ Secure chat message submitted to Primary Care Provider, Dr. Delynn Flavin, to report willingness to try new round of psychotropic medications. If experiencing a mental health crisis, please contact SAMHSA's (Substance Abuse & Mental Health Services Administration) [employer] [phone]). ~ SAMHSA is a free, confidential, 24/7, 365-day-a-year treatment referral & information service. ~ SAMHSA is for individuals & families facing mental and/or substance use disorders. Thoroughly reviewed "How to Manage Depression During the Holidays", to ensure understanding: ~ Stay Active & Get Outdoors ~ Share How You're Feeling With Trusted Loved Ones ~ Assess Your Relationships & Set Boundaries ~ Consider Volunteering Throughout the Holiday Season ~ Create a Coping Sheet or Depression Toolkit Thoroughly reviewed self-care activities & encouraged implementation: ~ Journaling ~ Acupuncture ~ Listening to Music ~ Spirituality ~ Positive Self-Talk Please develop a safety plan for where to go should a domestic violence incident occur. ~ A safety plan is a personalized & practical plan that can help you identify things you can do to better protect yourself & to help reduce the risk of being hurt. If you are planning to leave the home, here are a  few things to consider: ~ Put aside a bag with money, a change of clothes, identification or  driver's license & all prescription medications. ~ Put aside important documents, such as birth certificate, social security card, insurance card, bank statements, etc. Please contact the [employer] Violence Hotline (# (647) 122-7337), if you feel threatened, or that your life is in danger. Please contact Emergency Medical Services (# 911), if you feel threatened, or that your life is in danger.       SDOH assessments and interventions completed:  Yes.  Care Coordination Interventions:  Yes, provided.   Follow up plan: Follow up call scheduled for 09/15/2022 at 11:15 am.  Encounter Outcome:  Pt. Visit Completed.   Danford Bad, BSW, MSW, LCSW  Licensed Restaurant manager, fast food [facility] System  Mailing [place] [address], [place], [place] [address] Physical Address-[address] [place], [place] [address] Toll Free Main # [phone] Fax # [phone] Cell # [phone] [email or web address]

## 2022-09-08 NOTE — Patient Instructions (Signed)
Visit Information  Thank you for taking time to visit with me today. Please don't hesitate to contact me if I can be of assistance to you.   Following are the goals we discussed today:   Goals Addressed               This Visit's Progress     Reduce and Manage Symptoms of Anxiety and Depression. (pt-stated)   Not on track     Care Coordination Interventions:  Active listening/reflection utilized.  Solution-focused interventions implemented. Task-centered strategies employed. Problem-solving solutions activated. Verbalization of feelings encouraged.    Emotional & caregiver support provided. Caregiver stress acknowledged. Self-Enrollment in caregiver support group emphasized. Feelings of frustration & anger validated. Cognitive Behavioral Therapy performed. Client-Centered Therapy initiated. Deep Breathing Exercises, Relaxation Techniques & Mindfulness Meditation Strategies reviewed & practice encouraged daily. Assessed for suicidal & homicidal ideations - none present. Assessed for domestic violence - denied. CSW collaboration with Nevin Bloodgood, Print production planner at [employer] [phone]), to confirm inability to install handicapped accessible wheelchair ramp for personal use. Continue to work with Charyl Bigger, Injury Attorney with [employer] [phone]), to pursue settlement of lawsuit filed against [facility].   ~ Fall sustained in bathroom due to standing water from leaking toilet & faucet. ~ Work Order placed with management at [employer], one week after move-in date. ~ Patient, husband & 46 year old son have been residing in an apartment at [facility] for 12 months. ~ Several follow up conversations have been initiated with management at [facility], without results. ~ As of 09/08/2022, leaking toilet & faucet have still not been  repaired. Please keep initial counseling session with Dr. Tia Masker, Psychiatrist with [facility] at [place] [phone]), scheduled on 09/24/2022 at 9:30 am, in an effort to establish psychotherapeutic counseling services & psychotropic medication management. CSW collaboration with Primary Care Provider, Dr. Delynn Flavin [phone]), to report current psychotropic medication regimen not working effectively, per your request. ~ Secure chat message submitted to Primary Care Provider, Dr. Delynn Flavin, to report willingness to try new round of psychotropic medications. If experiencing a mental health crisis, please contact SAMHSA's (Substance Abuse & Mental Health Services Administration) [employer] [phone]). ~ SAMHSA is a free, confidential, 24/7, 365-day-a-year treatment referral & information service. ~ SAMHSA is for individuals & families facing mental and/or substance use disorders. Thoroughly reviewed "How to Manage Depression During the Holidays", to ensure understanding: ~ Stay Active & Get Outdoors ~ Share How You're Feeling With Trusted Loved Ones ~ Assess Your Relationships & Set Boundaries ~ Consider Volunteering Throughout the Holiday Season ~ Create a Coping Sheet or Depression Toolkit Thoroughly reviewed self-care activities & encouraged implementation: ~ Journaling ~ Acupuncture ~ Listening to Music ~ Spirituality ~ Positive Self-Talk Please develop a safety plan for where to go should a domestic violence incident occur. ~ A safety plan is a personalized & practical plan that can help you identify things you can do to better protect yourself & to help reduce the risk of being hurt. If you are planning to leave the home, here are a few things to consider: ~ Put aside a bag with money, a change of clothes, identification or driver's license & all prescription medications. ~ Put aside important documents, such as  birth certificate, social security card, insurance card, bank statements, etc. Please contact the [employer] Violence Hotline (# 240-869-7704), if  you feel threatened, or that your life is in danger. Please contact Emergency Medical Services (# 911), if you feel threatened, or that your life is in danger.       Our next appointment is by telephone on 09/15/2022 at 11:15 am.  Please call the care guide team at [phone] if you need to cancel or reschedule your appointment.   If you are experiencing a Mental Health or Behavioral Health Crisis or need someone to talk to, please call the Suicide and Crisis Lifeline: 988 call the [place] National Suicide Prevention Lifeline: (346)531-7988 or TTY: 754-332-7962 TTY 484 634 9249) to talk to a trained counselor call 1-800-273-TALK (toll free, 24 hour hotline) go to [facility] [address], [place] [phone]) call the [facility] Crisis Line: [phone] call 911  Patient verbalizes understanding of instructions and care plan provided today and agrees to view in MyChart. Active MyChart status and patient understanding of how to access instructions and care plan via MyChart confirmed with patient.       Telephone follow up appointment with care management team member scheduled for:  09/15/2021 at 11:15 am.   Danford Bad, BSW, MSW, LCSW  Licensed Clinical Social Worker  [facility]  Mailing [place]. [address] [place], [place] [address] Physical Address-[address], [place], [place] [address] Toll Free Main # [phone] Fax # [phone] Cell # [phone] [email or web address]

## 2022-09-11 DIAGNOSIS — M549 Dorsalgia, unspecified: Secondary | ICD-10-CM | POA: Diagnosis not present

## 2022-09-11 DIAGNOSIS — M79671 Pain in right foot: Secondary | ICD-10-CM | POA: Diagnosis not present

## 2022-09-11 DIAGNOSIS — M79672 Pain in left foot: Secondary | ICD-10-CM | POA: Diagnosis not present

## 2022-09-11 DIAGNOSIS — M79606 Pain in leg, unspecified: Secondary | ICD-10-CM | POA: Diagnosis not present

## 2022-09-14 ENCOUNTER — Ambulatory Visit (HOSPITAL_COMMUNITY): Payer: Medicare Other | Admitting: Psychiatry

## 2022-09-14 ENCOUNTER — Telehealth: Payer: Self-pay | Admitting: Family Medicine

## 2022-09-14 DIAGNOSIS — M545 Low back pain, unspecified: Secondary | ICD-10-CM | POA: Diagnosis not present

## 2022-09-14 DIAGNOSIS — Z79899 Other long term (current) drug therapy: Secondary | ICD-10-CM | POA: Diagnosis not present

## 2022-09-14 DIAGNOSIS — E1165 Type 2 diabetes mellitus with hyperglycemia: Secondary | ICD-10-CM | POA: Diagnosis not present

## 2022-09-14 DIAGNOSIS — G894 Chronic pain syndrome: Secondary | ICD-10-CM | POA: Diagnosis not present

## 2022-09-14 DIAGNOSIS — Z9181 History of falling: Secondary | ICD-10-CM | POA: Diagnosis not present

## 2022-09-14 DIAGNOSIS — E559 Vitamin D deficiency, unspecified: Secondary | ICD-10-CM | POA: Diagnosis not present

## 2022-09-14 NOTE — Telephone Encounter (Signed)
Got pt in for 01/15

## 2022-09-15 ENCOUNTER — Ambulatory Visit: Payer: Self-pay | Admitting: *Deleted

## 2022-09-15 ENCOUNTER — Encounter: Payer: Self-pay | Admitting: *Deleted

## 2022-09-15 NOTE — Patient Outreach (Signed)
Care Coordination   Follow Up Visit Note   09/15/2022  Name: Aimee Phillips MRN: [id number] DOB: 1971/06/14  Aimee Phillips is a 52 y.o. year old female who sees [name], DO for primary care. I spoke with [name] by phone today.  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  Reduce and Manage Symptoms of Anxiety and Depression.    Goals Addressed               This Visit's Progress     Reduce and Manage Symptoms of Anxiety and Depression. (pt-stated)   On track     Care Coordination Interventions:   Solution-focused interventions re-assessed. Task-centered strategies implemented. Problem-solving solutions developed. Active listening/reflection utilized.  Verbalization of feelings encouraged.    Emotional support provided. Caregiver stress acknowledged. Caregiver resources reviewed. Cognitive Behavioral Therapy initiated. Client-Centered Therapy performed. Deep Breathing Exercises, Relaxation Techniques & Mindfulness Meditation Strategies encouraged daily. Assessed for suicidal & homicidal ideations - none present. Assessed for domestic violence & intent to inflict self-harm - none present. Assessed for weapons in the home - none present. Continue to work with Franco Nones, Injury Attorney with [facility] [phone]), to pursue settlement of lawsuit filed against [facility].   ~ Fall sustained in bathroom due to standing water from leaking toilet & faucet. ~ Work Order placed with management at [employer], one week after move-in date. ~ Patient, husband & 4 year old son have been residing in an apartment at [employer] [facility] for 12 months. ~ Several follow up conversations have been initiated with management at [facility], without results. ~ As of 09/08/2022, leaking toilet & faucet have still not been repaired. Please keep initial  counseling session with Dr. Debby Bud, Psychiatrist with [facility] at [place] [phone]), scheduled on 09/24/2022 at 9:30 am, in an effort to establish psychotherapeutic counseling services & psychotropic medication management. Please keep follow-up appointment with Primary Care Provider, Dr. Ronnie Doss [phone]), scheduled on 09/25/2022 at 10:00 am.  If experiencing a mental health crisis, please contact SAMHSA's (Substance [place]) [employer] [phone]). ~ SAMHSA is a free, confidential, 24/7, 365-day-a-year treatment referral & information service. ~ SAMHSA is for individuals & families facing mental and/or substance use disorders. Continue to develop a safety plan for where to go should a domestic violence incident occur. ~ A safety plan is a personalized & practical plan that can help you identify things you can do to better protect yourself & to help reduce the risk of being hurt. ~ Put aside a bag with money, a change of clothes, identification or driver's license & all prescription medications. ~ Put aside important documents, such as birth certificate, social security card, insurance card, bank statements, etc. Please contact the [employer] Violence Hotline (# 418-344-3557) and/or Emergency Medical Services (# 911), if you feel threatened, or that your life is in danger. Please begin contacting the following list of dental agencies & resources, in an effort to obtain safe & affordable dental care, emailed to you on 09/15/2022: ~ [facility] (# [phone]) ~ [facility] [phone]) ~ [facility] (# 5803216035) ~ [facility] [phone]) ~ [facility] [phone])  ~ [facility] Department [phone])        SDOH assessments and interventions completed:   Yes.  Care Coordination Interventions:  Yes, provided.   Follow up plan: Follow up call scheduled for 09/29/2022 at 11:15 am.  Encounter Outcome:  Pt. Visit Completed.   Nat Christen, BSW, MSW, LCSW  Licensed Education officer, environmental Health System  Mailing [place] [address]. [address], [place], [place] [address] Physical Address-[address], [place], [place] [address] Toll Free Main # [phone] Fax # [phone] Cell # [phone] [email or web address]

## 2022-09-15 NOTE — Patient Instructions (Signed)
Visit Information  Thank you for taking time to visit with me today. Please don't hesitate to contact me if I can be of assistance to you.   Following are the goals we discussed today:   Goals Addressed               This Visit's Progress     Reduce and Manage Symptoms of Anxiety and Depression. (pt-stated)   On track     Care Coordination Interventions:   Solution-focused interventions re-assessed. Task-centered strategies implemented. Problem-solving solutions developed. Active listening/reflection utilized.  Verbalization of feelings encouraged.    Emotional support provided. Caregiver stress acknowledged. Caregiver resources reviewed. Cognitive Behavioral Therapy initiated. Client-Centered Therapy performed. Deep Breathing Exercises, Relaxation Techniques & Mindfulness Meditation Strategies encouraged daily. Assessed for suicidal & homicidal ideations - none present. Assessed for domestic violence & intent to inflict self-harm - none present. Assessed for weapons in the home - none present. Continue to work with Franco Nones, Injury Attorney with [facility] [phone]), to pursue settlement of lawsuit filed against [facility].   ~ Fall sustained in bathroom due to standing water from leaking toilet & faucet. ~ Work Order placed with management at [facility], one week after move-in date. ~ Patient, husband & 63 year old son have been residing in an apartment at [facility] for 12 months. ~ Several follow up conversations have been initiated with management at [facility], without results. ~ As of 09/08/2022, leaking toilet & faucet have still not been repaired. Please keep initial counseling session with Dr. Debby Bud, Psychiatrist with [facility] [place] [phone]), scheduled on 09/24/2022 at 9:30 am, in an effort to  establish psychotherapeutic counseling services & psychotropic medication management. Please keep follow-up appointment with Primary Care Provider, Dr. Ronnie Doss [phone]), scheduled on 09/25/2022 at 10:00 am.  If experiencing a mental health crisis, please contact SAMHSA's (Substance [place]) [employer] [phone]). ~ SAMHSA is a free, confidential, 24/7, 365-day-a-year treatment referral & information service. ~ SAMHSA is for individuals & families facing mental and/or substance use disorders. Continue to develop a safety plan for where to go should a domestic violence incident occur. ~ A safety plan is a personalized & practical plan that can help you identify things you can do to better protect yourself & to help reduce the risk of being hurt. ~ Put aside a bag with money, a change of clothes, identification or driver's license & all prescription medications. ~ Put aside important documents, such as birth certificate, social security card, insurance card, bank statements, etc. Please contact the [employer] Violence Hotline (# 757-300-9157) and/or Emergency Medical Services (# 911), if you feel threatened, or that your life is in danger. Please begin contacting the following list of dental agencies & resources, in an effort to obtain safe & affordable dental care, emailed to you on 09/15/2022: ~ [facility] (# [phone]) ~ [facility] Department [phone]) ~ [facility] (# [phone]) ~ [facility] [phone]) ~ [facility] [phone])  ~ [facility] Department [phone])        Our next appointment is by telephone on 09/29/2022 at 11:15 am.  Please call the care guide team at [phone] if you need to cancel or reschedule your appointment.   If you are experiencing a Mental Health or Behavioral  Health Crisis or need someone to talk to, please call the Suicide and Crisis Lifeline: 988 call the [place] National Suicide Prevention Lifeline: 718 605 3405 or TTY: 256-792-9191 TTY (617) 685-2254) to talk to a trained counselor call 1-800-273-TALK (toll free, 24 hour hotline) go to [facility] [address], [place] [phone]) call the [place]: [phone] call 911  Patient verbalizes understanding of instructions and care plan provided today and agrees to view in [place]. Active MyChart status and patient understanding of how to access instructions and care plan via MyChart confirmed with patient.     Telephone follow up appointment with care management team member scheduled for:  09/29/2022 at 11:15 am.    Nat Christen, BSW, MSW, [place]  Licensed Clinical Social Worker  [place]  [facility] [place]. [address] [place], [place] [address] Physical Address-[address] [place], [place] [address] Toll Free Main # [phone] Fax # [phone] Cell # [phone] [email or web address]

## 2022-09-18 DIAGNOSIS — Z79899 Other long term (current) drug therapy: Secondary | ICD-10-CM | POA: Diagnosis not present

## 2022-09-22 ENCOUNTER — Telehealth: Payer: Self-pay

## 2022-09-22 NOTE — Telephone Encounter (Signed)
ERROR

## 2022-09-25 ENCOUNTER — Ambulatory Visit: Payer: Medicare Other | Admitting: Family Medicine

## 2022-09-27 ENCOUNTER — Ambulatory Visit (INDEPENDENT_AMBULATORY_CARE_PROVIDER_SITE_OTHER): Payer: 59 | Admitting: Psychiatry

## 2022-09-27 ENCOUNTER — Encounter (HOSPITAL_COMMUNITY): Payer: Self-pay | Admitting: Psychiatry

## 2022-09-27 DIAGNOSIS — M545 Low back pain, unspecified: Secondary | ICD-10-CM | POA: Diagnosis not present

## 2022-09-27 DIAGNOSIS — F4310 Post-traumatic stress disorder, unspecified: Secondary | ICD-10-CM

## 2022-09-27 DIAGNOSIS — F411 Generalized anxiety disorder: Secondary | ICD-10-CM | POA: Insufficient documentation

## 2022-09-27 DIAGNOSIS — F410 Panic disorder [episodic paroxysmal anxiety] without agoraphobia: Secondary | ICD-10-CM

## 2022-09-27 DIAGNOSIS — G8929 Other chronic pain: Secondary | ICD-10-CM

## 2022-09-27 DIAGNOSIS — Z79891 Long term (current) use of opiate analgesic: Secondary | ICD-10-CM

## 2022-09-27 DIAGNOSIS — F332 Major depressive disorder, recurrent severe without psychotic features: Secondary | ICD-10-CM | POA: Insufficient documentation

## 2022-09-27 DIAGNOSIS — Z79899 Other long term (current) drug therapy: Secondary | ICD-10-CM | POA: Insufficient documentation

## 2022-09-27 DIAGNOSIS — E559 Vitamin D deficiency, unspecified: Secondary | ICD-10-CM

## 2022-09-27 MED ORDER — DULOXETINE HCL 60 MG PO CPEP: 120.0000 mg | ORAL_CAPSULE | Freq: Every day | ORAL | 1 refills | Status: DC
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
## 2022-09-27 NOTE — Patient Instructions (Addendum)
We discontinued your Wellbutrin today this should help with your ability to fall asleep at night.  Instead we will increase the Cymbalta to 120 mg once daily.  This should help with the anxiety, depression, chronic pain.  In 2 weeks we may consider a sleep aid.  I have made a referral for psychotherapy but it may take some time to get established.  If before our next appointment you find that the suicidal thoughts are worsening please let our office know or call 988 or [facility] at [phone]) and we can try to arrange having you come into the hospital for safety.  Please keep your gun and AMO stored separately, hide the sharp knives for now, and take medications when you are around other people.

## 2022-09-29 ENCOUNTER — Ambulatory Visit: Payer: Self-pay | Admitting: *Deleted

## 2022-09-29 ENCOUNTER — Encounter: Payer: Self-pay | Admitting: *Deleted

## 2022-09-29 NOTE — Patient Instructions (Signed)
Visit Information  Thank you for taking time to visit with me today. Please don't hesitate to contact me if I can be of assistance to you.   Following are the goals we discussed today:   Goals Addressed               This Visit's Progress     Reduce and Manage Symptoms of Anxiety and Depression. (pt-stated)   On track     Care Coordination Interventions:   Active listening & reflection utilized.  Verbalization of feelings encouraged.    Emotional support provided. Caregiver stress acknowledged. Caregiver resources emphasized. Solution-focused interventions implemented. Task-centered strategies employed. Problem-solving solutions activated. Acceptance & Commitment Therapy introduced. Cognitive Behavioral Therapy initiated. Client-Centered Therapy performed. Deep Breathing Exercises, Relaxation Techniques & Mindfulness Meditation Strategies encouraged daily. Assessed for suicidal & homicidal ideations - none present. Assessed for domestic violence & intent to inflict self-harm - none present. Assessed for weapons in the home - none accessible. Please keep follow-up appointment with Primary Care Provider, Dr. Ronnie Doss with [facility] [phone]), scheduled on 10/03/2022 at 3:05 pm.  Please discuss admission into an inpatient psychiatric facility for medication reconciliation & mood stabilization, with Primary Care Provider, Dr. Ronnie Doss with [facility] [phone]), during follow-up appointment, scheduled on 10/03/2022 at 3:05 pm.  Please keep follow-up appointment with Dr. Debby Bud, Psychiatrist with [facility] [place] [phone]), scheduled on 10/11/2022 at 11:00 am, to receive ongoing psychotherapeutic counseling services & psychotropic medication management. ~ Please consider Dr. Ladon Applebaum, Psychiatrist with [facility] at  [place] [phone]), offer & recommendation to place referral for inpatient psychiatric hospitalization. ~ Please be prepared to report your decision regarding referral to inpatient psychiatric hospital to Dr. Debby Bud, Psychiatrist with [facility] [place] [phone]), during follow-up appointment, scheduled on 10/11/2022 at 11:00 am. Please continue to administer psychotropic medications exactly as prescribed, by Dr. Debby Bud, Psychiatrist with [facility] at [place] [phone]), prescribed on 09/27/2022.          ~ Increased dosage of Cymbalta 60 mg, po, twice daily, for a total of 120 mg. Please contact SAMHSA's (Substance Abuse & [place]) [employer] [phone]), if experiencing a mental health crisis. ~ SAMHSA is a free, confidential, 24/7, 365-day-a-year treatment referral & information service. ~ SAMHSA is for individuals & families facing mental and/or substance use disorders. Continue to develop a safety plan for where to go should a domestic violence incident occur. ~ A safety plan is a personalized & practical plan that can help you identify things you can do to better protect yourself & to help reduce the risk of being hurt. ~ Put aside a bag with money, a change of clothes, identification or driver's license & all prescription medications. ~ Put aside important documents, such as birth certificate, social security card, insurance card, bank statements, etc. Please contact the [employer] Violence Hotline (# 5632157562) and/or Emergency Medical Services (# 911), if you feel threatened, or that your life is in danger. Congratulations on establishing dental services with Dr. Zelphia Cairo, Dentist with [facility] [phone]), to discuss procedure for teeth extraction & replacement with dentures. Please keep initial consult with Dr.  Zelphia Cairo, Dentist with [facility] [phone]), to undergo dental surgery for teeth extraction, scheduled on 11/11/2022 at 10:00 am. CSW collaboration with [employer]  representative (# [phone]), to inquire about dental policy, out-of-pocket expenses, benefits & coverage.          ~ HIPAA compliant message left on voicemail, as CSW awaits a return call. Continue to work with Franco Nones, Injury Attorney with [facility] [phone]), to pursue settlement of lawsuit filed against [facility].   ~ Fall sustained in bathroom due to standing water from leaking toilet & faucet. ~ Work Order placed with management at [facility], one week after move-in date. ~ Patient, husband & 52 year old son have been residing in an apartment at [facility] for 12 months. ~ Several follow up conversations have been initiated with management at [facility], without results. ~ Leaking toilet & faucet repaired on 08/15/2023.      Our next appointment is by telephone on 10/19/2022 at 10:15 am.  Please call the care guide team at [phone] if you need to cancel or reschedule your appointment.   If you are experiencing a Mental Health or [place] or need someone to talk to, please call the Suicide and Crisis Lifeline: 988 call the [place] National Suicide Prevention Lifeline: 928-084-6751 or TTY: 939 033 4679 TTY 310-126-4766) to talk to a trained counselor call 1-800-273-TALK (toll free, 24 hour hotline) go to [facility] [address], [place] [phone]) call the [place]: [phone] call 911  Patient verbalizes understanding of instructions and care plan provided today and agrees to view in [place]. Active MyChart status and patient understanding of how to access instructions and care  plan via MyChart confirmed with patient.     Telephone follow up appointment with care management team member scheduled for:  10/19/2022 at 10:15 am.  Nat Christen, BSW, MSW, [place]  Licensed Clinical Social Worker  [place]  Mailing [place]. [address], [place], [place] [address] Physical Address-[address] [place], [place] [address] Toll Free Main # [phone] Fax # [phone] Cell # [phone] [email or web address]

## 2022-09-29 NOTE — Patient Outreach (Signed)
Care Coordination   Follow Up Visit Note   09/29/2022  Name: Aimee Phillips MRN: [id number] DOB: 07/30/1971  Aimee Phillips is a 52 y.o. year old female who sees [name], DO for primary care. I spoke with [name] by phone today.  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  Reduce and Manage Symptoms of Anxiety and Depression.   Goals Addressed               This Visit's Progress     Reduce and Manage Symptoms of Anxiety and Depression. (pt-stated)   On track     Care Coordination Interventions:   Active listening & reflection utilized.  Verbalization of feelings encouraged.    Emotional support provided. Caregiver stress acknowledged. Caregiver resources emphasized. Solution-focused interventions implemented. Task-centered strategies employed. Problem-solving solutions activated. Acceptance & Commitment Therapy introduced. Cognitive Behavioral Therapy initiated. Client-Centered Therapy performed. Deep Breathing Exercises, Relaxation Techniques & Mindfulness Meditation Strategies encouraged daily. Assessed for suicidal & homicidal ideations - none present. Assessed for domestic violence & intent to inflict self-harm - none present. Assessed for weapons in the home - none accessible. Please keep follow-up appointment with Primary Care Provider, Dr. Ronnie Doss with [facility] [phone]), scheduled on 10/03/2022 at 3:05 pm.  Please discuss admission into an inpatient psychiatric facility for medication reconciliation & mood stabilization, with Primary Care Provider, Dr. Ronnie Doss with [facility] [phone]), during follow-up appointment, scheduled on 10/03/2022 at 3:05 pm.  Please keep follow-up appointment with Dr. Debby Bud, Psychiatrist with [facility] at [place] [phone]), scheduled on 10/11/2022 at 11:00 am, to receive  ongoing psychotherapeutic counseling services & psychotropic medication management. ~ Please consider Dr. Ladon Applebaum, Psychiatrist with [facility] at [place] [phone]), offer & recommendation to place referral for inpatient psychiatric hospitalization. ~ Please be prepared to report your decision regarding referral to inpatient psychiatric hospital to Dr. Debby Bud, Psychiatrist with [facility] at [place] [phone]), during follow-up appointment, scheduled on 10/11/2022 at 11:00 am. Please continue to administer psychotropic medications exactly as prescribed, by Dr. Debby Bud, Psychiatrist with [facility] [place] [phone]), prescribed on 09/27/2022.          ~ Increased dosage of Cymbalta 60 mg, po, twice daily, for a total of 120 mg. Please contact SAMHSA's (Substance Abuse & [place]) [employer] [phone]), if experiencing a mental health crisis. ~ SAMHSA is a free, confidential, 24/7, 365-day-a-year treatment referral & information service. ~ SAMHSA is for individuals & families facing mental and/or substance use disorders. Continue to develop a safety plan for where to go should a domestic violence incident occur. ~ A safety plan is a personalized & practical plan that can help you identify things you can do to better protect yourself & to help reduce the risk of being hurt. ~ Put aside a bag with money, a change of clothes, identification or driver's license & all prescription medications. ~ Put aside important documents, such as birth certificate, social security card, insurance card, bank statements, etc. Please contact the [employer] Violence Hotline (# 707 867 4570) and/or Emergency Medical Services (# 911), if you feel threatened, or that your life is in danger. Congratulations on establishing dental services  with Dr. Zelphia Cairo, Dentist with [facility] [phone]), to discuss procedure for teeth extraction & replacement with dentures. Please keep  initial consult with Dr. Zelphia Cairo, Dentist with [facility] [phone]), to undergo dental surgery for teeth extraction, scheduled on 11/11/2022 at 10:00 am. CSW collaboration with [employer] representative (# [phone]), to inquire about dental policy, out-of-pocket expenses, benefits & coverage.          ~ HIPAA compliant message left on voicemail, as CSW awaits a return call. Continue to work with Franco Nones, Injury Attorney with [facility] [phone]), to pursue settlement of lawsuit filed against [facility].   ~ Fall sustained in bathroom due to standing water from leaking toilet & faucet. ~ Work Order placed with management at [facility], one week after move-in date. ~ Patient, husband & 40 year old son have been residing in an apartment at [facility] for 12 months. ~ Several follow up conversations have been initiated with management at [facility], without results. ~ Leaking toilet & faucet repaired on 08/15/2023.      SDOH assessments and interventions completed:  Yes.  Care Coordination Interventions:  Yes, provided.   Follow up plan: Follow up call scheduled for 10/19/2022 at 10:15 am.  Encounter Outcome:  Pt. Visit Completed.   Nat Christen, BSW, MSW, LCSW  Licensed Education officer, [facility]  Mailing [place] [address] [place], [place] [address] Physical Address-[address], [place], [place] [address] Toll Free Main # [phone] Fax # [phone] Cell # [phone] [email or web address]

## 2022-10-02 ENCOUNTER — Encounter (HOSPITAL_COMMUNITY): Payer: Self-pay

## 2022-10-03 ENCOUNTER — Ambulatory Visit (INDEPENDENT_AMBULATORY_CARE_PROVIDER_SITE_OTHER): Payer: 59 | Admitting: Family Medicine

## 2022-10-03 ENCOUNTER — Encounter: Payer: Self-pay | Admitting: Family Medicine

## 2022-10-03 VITALS — BP 128/76 | HR 100 | Temp 98.5°F | Ht 68.0 in | Wt 308.0 lb

## 2022-10-03 DIAGNOSIS — E559 Vitamin D deficiency, unspecified: Secondary | ICD-10-CM | POA: Diagnosis not present

## 2022-10-03 DIAGNOSIS — E1165 Type 2 diabetes mellitus with hyperglycemia: Secondary | ICD-10-CM

## 2022-10-03 DIAGNOSIS — Z23 Encounter for immunization: Secondary | ICD-10-CM

## 2022-10-03 DIAGNOSIS — F332 Major depressive disorder, recurrent severe without psychotic features: Secondary | ICD-10-CM

## 2022-10-03 DIAGNOSIS — R7989 Other specified abnormal findings of blood chemistry: Secondary | ICD-10-CM | POA: Diagnosis not present

## 2022-10-03 DIAGNOSIS — D72829 Elevated white blood cell count, unspecified: Secondary | ICD-10-CM

## 2022-10-03 LAB — BAYER DCA HB A1C WAIVED: HB A1C (BAYER DCA - WAIVED): 6.8 % — ABNORMAL HIGH (ref 4.8–5.6)

## 2022-10-03 NOTE — Addendum Note (Signed)
Addended by: Gerilyn Nestle on: 10/03/2022 05:12 PM   Modules accepted: Orders

## 2022-10-03 NOTE — Progress Notes (Signed)
Subjective: CC: Follow-up abnormal labs PCP: [name], DO FVC:BSWHQP D Altidor is a 52 y.o. female presenting to clinic today for:  1.  Abnormal labs Patient had several labs obtained at her pain specialist.  This demonstrated severe vitamin D deficiency at the level of just 8.  She was given a 4-week course of ergocalciferol and did not really notice any significant changes in her energy or mood with that.  She reports ongoing severe depression despite medication modification recently with Dr. Sheran Lawless.  They have gotten her off of the Wellbutrin and advance her dose of Cymbalta.  She notes that her son actually told her that she should just go kill herself and this really hurt her feelings.  She has been strongly considering inpatient treatment for major depressive disorder if this would give her reprieve from the depression.  She has had suicidal thoughts but does not have any intent.  She feels well supported by her spouse, who is present for today's visit.  She reports that pain in her left shoulder is somewhat persistent but she is really gained a lot of function after the surgery.  She has concerns over the elevation in her liver function tests.  She has been really trying to get weight off and continues to take her diabetes medications as directed.  Currently treated with Ozempic 2 mg weekly, Lantus, NovoLog, statin and ACE inhibitor.   ROS: Per HPI  No Known Allergies Past Medical History:  Diagnosis Date   Asthma    Bipolar affect, depressed ([place])    Cellulitis    COPD (chronic obstructive pulmonary disease) (HCC)    Depression    Diabetes mellitus without complication (HCC)    Type II   PE (pulmonary embolism)    PONV (postoperative nausea and vomiting)    Pulmonary edema     Current Outpatient Medications:    blood glucose meter kit and supplies, Check BS up to four times a day, Disp: 1 each, Rfl: 0   Blood Glucose Monitoring Suppl DEVI, Check bs up to four times  a day, Disp: 1 each, Rfl: 0   budesonide-formoterol (SYMBICORT) 160-4.5 MCG/ACT inhaler, Inhale 2 puffs into the lungs 2 (two) times daily. (Patient taking differently: Inhale 2 puffs into the lungs 2 (two) times daily as needed (respiratory issues.).), Disp: 10.2 g, Rfl: 11   Cholecalciferol (VITAMIN D3) 1.25 MG (50000 UT) CAPS, Take 1 capsule by mouth once a week., Disp: , Rfl:    Continuous Blood Gluc Receiver (FREESTYLE LIBRE 2 READER) DEVI, Use to test blood sugar continuously. DX E11.65, Disp: 1 each, Rfl: 0   Continuous Blood Gluc Sensor (FREESTYLE LIBRE 2 SENSOR) MISC, Use to test blood sugar continuously. Apply sensor to arm every 14 days. DX E11.65, Disp: 2 each, Rfl: 11   cyclobenzaprine (FLEXERIL) 10 MG tablet, Take 1 tablet (10 mg total) by mouth 3 (three) times daily as needed for muscle spasms., Disp: 90 tablet, Rfl: 5   dicyclomine (BENTYL) 10 MG capsule, TAKE ONE CAPSULE BY MOUTH EVERY 8 HOURS AS NEEDED SPASMS (FOR ABDOMINAL PAIN), Disp: 60 capsule, Rfl: 5   docusate sodium (COLACE) 100 MG capsule, Take 100 mg by mouth daily as needed (constipation.)., Disp: , Rfl:    DULoxetine (CYMBALTA) 60 MG capsule, Take 2 capsules (120 mg total) by mouth daily., Disp: 60 capsule, Rfl: 1   furosemide (LASIX) 40 MG tablet, TAKE ONE TABLET BY MOUTH TWICE DAILY, Disp: 60 tablet, Rfl: 1   gabapentin (NEURONTIN)  300 MG capsule, Take 900 mg by mouth in the morning, at noon, in the evening, and at bedtime., Disp: , Rfl:    glucose blood (ACCU-CHEK AVIVA) test strip, Use to check blood sugar four times daily, Disp: 200 each, Rfl: 12   insulin aspart (NOVOLOG) 100 UNIT/ML injection, Inject 20 Units into the skin daily as needed for high blood sugar., Disp: , Rfl:    insulin glargine (LANTUS SOLOSTAR) 100 UNIT/ML Solostar Pen, Inject 40 Units into the skin 2 (two) times daily., Disp: 30 mL, Rfl: 0   Insulin Pen Needle (EASY COMFORT PEN NEEDLES) 31G X 5 MM MISC, UAD for insulin injection E11.65, Disp: 100  each, Rfl: 0   Lancet Devices MISC, Test QID, DX E11.65, Aviva Plus, Disp: 100 each, Rfl: prn   levocetirizine (XYZAL) 5 MG tablet, Take 1 tablet (5 mg total) by mouth every evening. For allergies, Disp: 90 tablet, Rfl: 3   linaclotide (LINZESS) 290 MCG CAPS capsule, Take 1 capsule (290 mcg total) by mouth daily before breakfast., Disp: 30 capsule, Rfl: 12   lisinopril (ZESTRIL) 5 MG tablet, Take 1 tablet (5 mg total) by mouth daily., Disp: 90 tablet, Rfl: 3   Misc. Devices (TRANSPORT CHAIR) MISC, Needs lift chair for >300lbs, Disp: 1 each, Rfl: 0   omeprazole (PRILOSEC) 20 MG capsule, TAKE ONE CAPSULE BY MOUTH ONCE DAILY, Disp: 90 capsule, Rfl: 0   oxyCODONE-acetaminophen (PERCOCET) 10-325 MG tablet, Take 1 tablet by mouth every 4 (four) hours as needed for pain., Disp: , Rfl:    rosuvastatin (CRESTOR) 20 MG tablet, TAKE ONE TABLET BY MOUTH DAILY (BEDTIME), Disp: 90 tablet, Rfl: 0   Semaglutide, 2 MG/DOSE, 8 MG/3ML SOPN, Inject 2 mg as directed once a week. (Patient taking differently: Inject 2 mg as directed every Tuesday.), Disp: 3 mL, Rfl: 12   solifenacin (VESICARE) 10 MG tablet, TAKE ONE TABLET BY MOUTH ONCE DAILY, Disp: 90 tablet, Rfl: 3   SPIRIVA HANDIHALER 18 MCG inhalation capsule, INHALE ONE PUFF BY MOUTH DAILY. (Patient taking differently: Place 18 mcg into inhaler and inhale daily as needed (asthma).), Disp: 30 capsule, Rfl: 12   topiramate (TOPAMAX) 100 MG tablet, Take 100 mg by mouth in the morning., Disp: , Rfl:  Social History   Socioeconomic History   Marital status: Married    Spouse name: Nakaila Freeze   Number of children: 3   Years of education: 12   Highest education level: High school graduate  Occupational History   Occupation: Disabled  Tobacco Use   Smoking status: Never    Passive exposure: Never   Smokeless tobacco: Never  Vaping Use   Vaping Use: Never used  Substance and Sexual Activity   Alcohol use: No   Drug use: No   Sexual activity: Not Currently     Partners: Male  Other Topics Concern   Not on file  Social History Narrative   Not on file   Social Determinants of Health   Financial Resource Strain: Low Risk  (08/09/2022)   Overall Financial Resource Strain (CARDIA)    Difficulty of Paying Living Expenses: Not hard at all  Food Insecurity: Food Insecurity Present (08/09/2022)   Hunger Vital Sign    Worried About Running Out of Food in the Last Year: Sometimes true    Ran Out of Food in the Last Year: Never true  Transportation Needs: Unmet Transportation Needs (08/09/2022)   PRAPARE - Hydrologist (Medical): Yes  Lack of Transportation (Non-Medical): No  Physical Activity: Inactive (08/09/2022)   Exercise Vital Sign    Days of Exercise per Week: 0 days    Minutes of Exercise per Session: 0 min  Stress: No Stress Concern Present (08/09/2022)   [employer] of Occupational Health - Occupational Stress Questionnaire    Feeling of Stress : Not at all  Social Connections: Socially Integrated (08/09/2022)   Social Connection and Isolation Panel [NHANES]    Frequency of Communication with Friends and Family: Three times a week    Frequency of Social Gatherings with Friends and Family: Three times a week    Attends Religious Services: More than 4 times per year    Active Member of Clubs or Organizations: Yes    Attends Banker Meetings: 1 to 4 times per year    Marital Status: Married  Catering manager Violence: Not At Risk (08/09/2022)   Humiliation, Afraid, Rape, and Kick questionnaire    Fear of Current or Ex-Partner: No    Emotionally Abused: No    Physically Abused: No    Sexually Abused: No   Family History  Problem Relation Age of Onset   Cancer Mother        breast   Heart failure Father        died of AMI   Cancer Sister    Diabetes Brother    Heart disease Sister     Objective: Office vital signs reviewed. BP 128/76   Pulse 100   Temp 98.5 F (36.9 C)   Ht  5\' 8"  (1.727 m)   Wt (!) 308 lb (139.7 kg)   LMP  (LMP Unknown)   SpO2 96%   BMI 46.83 kg/m   Physical Examination:  General: Awake, alert, morbidly obese female, tearful HEENT: MMM Cardio: regular rate and rhythm, S1S2 heard, no murmurs appreciated Pulm: clear to auscultation bilaterally, no wheezes, rhonchi or rales; normal work of breathing on room air MSK: arrives in wheelchair.  Healing post surgical scare along anterior left shoulder Psych: tearful, depressed    10/03/2022    3:05 PM 09/27/2022   10:07 AM 08/09/2022   12:12 PM  Depression screen PHQ 2/9  Decreased Interest 2 3 0  Down, Depressed, Hopeless 2 3 2   PHQ - 2 Score 4 6 2   Altered sleeping 2 3 1   Tired, decreased energy 2 3 1   Change in appetite 1 3 1   Feeling bad or failure about yourself  3 3 1   Trouble concentrating 0 0 1  Moving slowly or fidgety/restless 0 1 0  Suicidal thoughts 1 1 0  PHQ-9 Score 13 20 7   Difficult doing work/chores  Extremely dIfficult       10/03/2022    3:05 PM 05/22/2022   11:12 AM 01/16/2022   10:33 AM 12/05/2021    2:01 PM  GAD 7 : Generalized Anxiety Score  Nervous, Anxious, on Edge 0 1 1 1   Control/stop worrying 0 0 1 0  Worry too much - different things 0 0 1 1  Trouble relaxing 0 1 1 1   Restless 0 0 1 1  Easily annoyed or irritable 0 1 1 3   Afraid - awful might happen 0 0 1 0  Total GAD 7 Score 0 3 7 7   Anxiety Difficulty Not difficult at all Very difficult Somewhat difficult Somewhat difficult    Assessment/ Plan: 52 y.o. female   Elevated liver function tests - Plan: CMP14+EGFR, CMP14+EGFR, CANCELED: Hepatic function panel  Leukocytosis, unspecified type - Plan: CBC, CBC  Uncontrolled type 2 diabetes mellitus with hyperglycemia (HCC) - Plan: Bayer DCA Hb A1c Waived, Bayer DCA Hb A1c Waived  Vitamin D deficiency - Plan: VITAMIN D 25 Hydroxy (Vit-D Deficiency, Fractures), VITAMIN D 25 Hydroxy (Vit-D Deficiency, Fractures)  Severe episode of recurrent major  depressive disorder, without psychotic features (HCC)  Suspect that there is an underlying fatty liver disease.  We discussed that fatty liver disease can progress to cirrhosis when left untreated.  She is actively working on weight loss and is on a GLP.  We will recheck these liver enzymes along with renal function.  Also noted to have a mild leukocytosis on last lab draw.  Check CBC  Check A1c.  [facility] consider [facility] if she is not clinically meeting goals.  Check vitamin D level.  Anticipate that I will keep her on a weekly vitamin D supplement.  Agree that inpatient admission may be to her benefit given that she is still not meeting her clinical goals from a psychiatric standpoint.  I will CC Dr. Gust Rung as [facility] if she is interested in this but apparently was told that she would "lose her child" if she wanted to an inpatient facility.  I would be shocked if that was the case but I will CC him with this information as this is her main barrier to inpatient treatment  No orders of the defined types were placed in this encounter.  No orders of the defined types were placed in this encounter.    Raliegh Ip, DO [facility] [phone]

## 2022-10-04 ENCOUNTER — Other Ambulatory Visit: Payer: Self-pay | Admitting: Family Medicine

## 2022-10-04 DIAGNOSIS — E559 Vitamin D deficiency, unspecified: Secondary | ICD-10-CM

## 2022-10-04 LAB — CMP14+EGFR
ALT: 32 IU/L (ref 0–32)
AST: 55 IU/L — ABNORMAL HIGH (ref 0–40)
Albumin/Globulin Ratio: 1.4 (ref 1.2–2.2)
Albumin: 4 g/dL (ref 3.8–4.9)
Alkaline Phosphatase: 87 IU/L (ref 44–121)
BUN/Creatinine Ratio: 16 (ref 9–23)
BUN: 12 mg/dL (ref 6–24)
Bilirubin Total: <0.2 mg/dL (ref 0.0–1.2)
CO2: 21 mmol/L (ref 20–29)
Calcium: 9.4 mg/dL (ref 8.7–10.2)
Chloride: 103 mmol/L (ref 96–106)
Creatinine, Ser: 0.73 mg/dL (ref 0.57–1.00)
Globulin, Total: 2.8 g/dL (ref 1.5–4.5)
Glucose: 103 mg/dL — ABNORMAL HIGH (ref 70–99)
Potassium: 4.2 mmol/L (ref 3.5–5.2)
Sodium: 137 mmol/L (ref 134–144)
Total Protein: 6.8 g/dL (ref 6.0–8.5)
eGFR: 100 mL/min/{1.73_m2} (ref >59)

## 2022-10-04 LAB — CBC
Hematocrit: 46.5 % (ref 34.0–46.6)
Hemoglobin: 15.3 g/dL (ref 11.1–15.9)
MCH: 30.6 pg (ref 26.6–33.0)
MCHC: 32.9 g/dL (ref 31.5–35.7)
MCV: 93 fL (ref 79–97)
Platelets: 254 10*3/uL (ref 150–450)
RBC: 5 x10E6/uL (ref 3.77–5.28)
RDW: 12.3 % (ref 11.7–15.4)
WBC: 8 10*3/uL (ref 3.4–10.8)

## 2022-10-04 LAB — VITAMIN D 25 HYDROXY (VIT D DEFICIENCY, FRACTURES): Vit D, 25-Hydroxy: 19 ng/mL — ABNORMAL LOW (ref 30.0–100.0)

## 2022-10-04 MED ORDER — VITAMIN D (ERGOCALCIFEROL) 1.25 MG (50000 UNIT) PO CAPS: 50000.0000 [IU] | ORAL_CAPSULE | ORAL | 3 refills | Status: DC

## 2022-10-05 DIAGNOSIS — J449 Chronic obstructive pulmonary disease, unspecified: Secondary | ICD-10-CM | POA: Diagnosis not present

## 2022-10-09 ENCOUNTER — Ambulatory Visit: Payer: Medicare Other | Admitting: Family Medicine

## 2022-10-11 ENCOUNTER — Telehealth (INDEPENDENT_AMBULATORY_CARE_PROVIDER_SITE_OTHER): Payer: 59 | Admitting: Psychiatry

## 2022-10-11 ENCOUNTER — Encounter (HOSPITAL_COMMUNITY): Payer: Self-pay | Admitting: Psychiatry

## 2022-10-11 DIAGNOSIS — R45851 Suicidal ideations: Secondary | ICD-10-CM | POA: Diagnosis not present

## 2022-10-11 DIAGNOSIS — Z79891 Long term (current) use of opiate analgesic: Secondary | ICD-10-CM

## 2022-10-11 DIAGNOSIS — E559 Vitamin D deficiency, unspecified: Secondary | ICD-10-CM

## 2022-10-11 DIAGNOSIS — F411 Generalized anxiety disorder: Secondary | ICD-10-CM | POA: Diagnosis not present

## 2022-10-11 DIAGNOSIS — F332 Major depressive disorder, recurrent severe without psychotic features: Secondary | ICD-10-CM

## 2022-10-11 DIAGNOSIS — F4310 Post-traumatic stress disorder, unspecified: Secondary | ICD-10-CM | POA: Diagnosis not present

## 2022-10-11 DIAGNOSIS — F410 Panic disorder [episodic paroxysmal anxiety] without agoraphobia: Secondary | ICD-10-CM

## 2022-10-11 DIAGNOSIS — G4733 Obstructive sleep apnea (adult) (pediatric): Secondary | ICD-10-CM

## 2022-10-11 DIAGNOSIS — Z79899 Other long term (current) drug therapy: Secondary | ICD-10-CM

## 2022-10-11 DIAGNOSIS — M545 Low back pain, unspecified: Secondary | ICD-10-CM

## 2022-10-11 DIAGNOSIS — G8929 Other chronic pain: Secondary | ICD-10-CM

## 2022-10-11 NOTE — Patient Instructions (Signed)
We did not make any medication changes to her regimen today as I will defer those to the inpatient team.  Please contact family members to see who would be available to drive you to the psych emergency department at [facility]. in [place].  There open 24 hours a day 7 days a week so as soon as anyone is available they will be available to see you.  If he needed more immediate transport or are feeling more acutely unsafe in the home please call [phone] as this is the mental health specific crisis line and they can arrange transport for you to a hospital.
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
## 2022-10-16 DIAGNOSIS — Z9181 History of falling: Secondary | ICD-10-CM | POA: Diagnosis not present

## 2022-10-16 DIAGNOSIS — Z79899 Other long term (current) drug therapy: Secondary | ICD-10-CM | POA: Diagnosis not present

## 2022-10-16 DIAGNOSIS — G894 Chronic pain syndrome: Secondary | ICD-10-CM | POA: Diagnosis not present

## 2022-10-16 DIAGNOSIS — M545 Low back pain, unspecified: Secondary | ICD-10-CM | POA: Diagnosis not present

## 2022-10-16 DIAGNOSIS — G629 Polyneuropathy, unspecified: Secondary | ICD-10-CM | POA: Diagnosis not present

## 2022-10-16 DIAGNOSIS — E1165 Type 2 diabetes mellitus with hyperglycemia: Secondary | ICD-10-CM | POA: Diagnosis not present

## 2022-10-19 ENCOUNTER — Ambulatory Visit: Payer: 59 | Admitting: *Deleted

## 2022-10-19 ENCOUNTER — Encounter: Payer: Self-pay | Admitting: *Deleted

## 2022-10-19 ENCOUNTER — Other Ambulatory Visit (INDEPENDENT_AMBULATORY_CARE_PROVIDER_SITE_OTHER): Payer: Self-pay | Admitting: Gastroenterology

## 2022-10-19 DIAGNOSIS — Z79899 Other long term (current) drug therapy: Secondary | ICD-10-CM | POA: Diagnosis not present

## 2022-10-19 DIAGNOSIS — G8929 Other chronic pain: Secondary | ICD-10-CM
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
## 2022-10-20 DIAGNOSIS — M79672 Pain in left foot: Secondary | ICD-10-CM | POA: Diagnosis not present

## 2022-10-20 DIAGNOSIS — M549 Dorsalgia, unspecified: Secondary | ICD-10-CM | POA: Diagnosis not present

## 2022-10-20 DIAGNOSIS — M79671 Pain in right foot: Secondary | ICD-10-CM | POA: Diagnosis not present

## 2022-10-20 DIAGNOSIS — M79606 Pain in leg, unspecified: Secondary | ICD-10-CM | POA: Diagnosis not present

## 2022-10-24 ENCOUNTER — Telehealth (INDEPENDENT_AMBULATORY_CARE_PROVIDER_SITE_OTHER): Payer: Self-pay

## 2022-10-24 ENCOUNTER — Ambulatory Visit (INDEPENDENT_AMBULATORY_CARE_PROVIDER_SITE_OTHER): Payer: 59 | Admitting: Clinical

## 2022-10-24 ENCOUNTER — Encounter (HOSPITAL_COMMUNITY): Payer: Self-pay

## 2022-10-24 DIAGNOSIS — F331 Major depressive disorder, recurrent, moderate: Secondary | ICD-10-CM | POA: Diagnosis not present

## 2022-10-24 DIAGNOSIS — F419 Anxiety disorder, unspecified: Secondary | ICD-10-CM

## 2022-10-24 DIAGNOSIS — F4310 Post-traumatic stress disorder, unspecified: Secondary | ICD-10-CM

## 2022-10-24 NOTE — Telephone Encounter (Signed)
Patient called today states she is in need of her dicyclomine, and wants it sent to [facility] in [place]. I advised that she had not been see since 12/22/2020, and she has cancelled or no showed several times since then, She cancelled the appointments on 06/23/21,08/01/21, and no showed on 10/24/2021. I advised that we could not refill a medication without seeing the patient that she could either call her pcp and have them send in the medication or she would have to schedule an appointment with [name] and only then would we send in enough medication to last until the appointment. The patient agreed to being scheduled her for an appointment and was transferred to the receptionist. The receptionist offered several appointments even some that was this week. Patient did not want any of the appointments offered to her and she disconnected the call. Therefore no medication sent to [facility].
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
## 2022-10-24 NOTE — Telephone Encounter (Signed)
Thanks for the update

## 2022-11-02 ENCOUNTER — Ambulatory Visit: Payer: Self-pay | Admitting: *Deleted

## 2022-11-02 ENCOUNTER — Encounter: Payer: Self-pay | Admitting: *Deleted

## 2022-11-02 NOTE — Patient Outreach (Signed)
Care Coordination   Follow Up Visit Note   11/02/2022  Name: Aimee Phillips MRN: [id number] DOB: 1970-12-20  Aimee Phillips is a 52 y.o. year old female who sees Aimee Norlander, DO for primary care. I spoke with Aimee Phillips by phone today.  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   Reduce and Manage Symptoms of Anxiety and Depression.   Goals Addressed               This Visit's Progress     Reduce and Manage Symptoms of Anxiety and Depression. (pt-stated)   On track     Care Coordination Interventions:  Interventions Today    Flowsheet Row Most Recent Value  Chronic Disease   Chronic disease during today's visit Hypertension (HTN), Diabetes, Chronic Obstructive Pulmonary Disease (COPD), Other  [Morbid Obesity, Chronic Opiate Therapy, Post-Traumatic Stress Disorder, Major Depressive Disorder, Recurrent Episode, Severe, Generalized Anxiety Disorder with Panic Attacks & Marital Conflicts.]  General Interventions   General Interventions Phillips General Interventions Phillips, General Interventions Reviewed, Annual Eye Exam, Labs, Annual Foot Exam, Durable Medical Equipment (DME), Vaccines, Health Screening, [employer], Doctor Visits, Communication with  Aimee Phillips]  Labs Hgb A1c annually  Vaccines COVID-19, Flu, Pneumonia, RSV, Shingles, Tetanus/Pertussis/Diphtheria  [Encouraged]  Doctor Visits Phillips Doctor Visits Phillips, Doctor Visits Reviewed, Annual Wellness Visits, PCP, Specialist  [Encouraged]  Health Screening Colonoscopy, Mammogram  [Encouraged]  Durable Medical Equipment (DME) BP Cuff, Glucomoter, Oxygen, Environmental consultant, Community education officer  PCP/Specialist Visits Compliance with follow-up visit  Communication with PCP/Specialists, RN  Exercise Interventions   Exercise Phillips Exercise Phillips, Exercise Reviewed, Physical Activity, Assistive device use and maintanence  [Encouraged]   Physical Activity Phillips Physical Activity Phillips, Physical Activity Reviewed, Types of exercise, Home Exercise Program (HEP)  [Encouraged]  Education Interventions   Education Provided Provided Engineer, site, Provided Education  Provided Verbal Education On Nutrition, Foot Care, Eye Care, Blood Sugar Monitoring, Mental Health/Coping with Illness, Applications, Exercise, Medication, When to see the doctor, [employer], Personal assistant  [Encouraged]  Aimee Phillips, Mental Health Reviewed, Coping Strategies, Crisis, Suicide, Substance Abuse, Grief and Loss, Depression, Anxiety  Nutrition Interventions   Nutrition Phillips Nutrition Phillips, Nutrition Reviewed, Fluid intake, Portion sizes, Decreasing sugar intake, Increaing proteins, Decreasing fats, Decreasing salt  Pharmacy Interventions   Pharmacy Dicussed/Reviewed Pharmacy Topics Phillips, Pharmacy Topics Reviewed, Medication Adherence, Affording Medications  Safety Interventions   Safety Phillips Safety Phillips, Safety Reviewed, Fall Risk, Home Safety  Home Safety Assistive Devices, Refer for community resources  Advanced Directive Interventions   Advanced Directives Phillips Advanced Directives Phillips, Advanced Directives Reviewed     Active listening & reflection utilized.  Verbalization of feelings encouraged.    Emotional support provided. Feelings of frustration regarding loss of independence validated. Caregiver stress acknowledged. Caregiver resources reviewed. Self enrollment in caregiver support group of interest emphasized. Assessed for suicidal & homicidal ideations - none present. Assessed for domestic violence & intent to inflict self-harm - none present. Assessed for weapons in the home - none present. Solution-focused interventions implemented. Task-centered strategies  employed. Problem-solving solutions activated. Acceptance & Commitment Therapy indicated. Cognitive Behavioral Therapy initiated. Client-Centered Therapy performed. Deep Breathing Exercises, Relaxation Techniques & Mindfulness Meditation Strategies encouraged daily. Please routinely follow-up with Aimee Phillips, Psychiatrist with [facility] [place] [phone]), to receive ongoing psychotherapeutic counseling services & psychotropic medication management. Please keep initial consult with Dr.  Zelphia Phillips, Dentist with [facility] [phone]), to undergo dental surgery for teeth extraction, scheduled on 11/11/2022 at 10:00 am.       SDOH assessments and interventions completed:  Yes.  Care Coordination Interventions:  Yes, provided.   Follow up plan: Follow up call scheduled for 11/15/2022 at 2:00 pm.  Encounter Outcome:  Pt. Visit Completed.   Aimee Phillips, BSW, MSW, LCSW  Licensed Education officer, environmental Health System  Mailing [place] [address] [place], [place] [address] Physical Address[address], [place], [place] [address] Toll Free Main # [phone] Fax # [phone] Cell # [phone] [email or web address]

## 2022-11-02 NOTE — Patient Instructions (Signed)
Visit Information  Thank you for taking time to visit with me today. Please don't hesitate to contact me if I can be of assistance to you.   Following are the goals we discussed today:   Goals Addressed               This Visit's Progress     Reduce and Manage Symptoms of Anxiety and Depression. (pt-stated)   On track     Care Coordination Interventions:  Interventions Today    Flowsheet Row Most Recent Value  Chronic Disease   Chronic disease during today's visit Hypertension (HTN), Diabetes, Chronic Obstructive Pulmonary Disease (COPD), Other  [Morbid Obesity, Chronic Opiate Therapy, Post-Traumatic Stress Disorder, Major Depressive Disorder, Recurrent Episode, Severe, Generalized Anxiety Disorder with Panic Attacks & Marital Conflicts.]  General Interventions   General Interventions Discussed/Reviewed General Interventions Discussed, General Interventions Reviewed, Annual Eye Exam, Labs, Annual Foot Exam, Durable Medical Equipment (DME), Vaccines, Health Screening, [employer], Doctor Visits, Communication with  [name]]  Labs Hgb A1c annually  Vaccines COVID-19, Flu, Pneumonia, RSV, Shingles, Tetanus/Pertussis/Diphtheria  [Encouraged]  Doctor Visits Discussed/Reviewed Doctor Visits Discussed, Doctor Visits Reviewed, Annual Wellness Visits, PCP, Specialist  [Encouraged]  Health Screening Colonoscopy, Mammogram  [Encouraged]  Durable Medical Equipment (DME) BP Cuff, Glucomoter, Oxygen, Environmental consultant, Community education officer  PCP/Specialist Visits Compliance with follow-up visit  Communication with PCP/Specialists, RN  Exercise Interventions   Exercise Discussed/Reviewed Exercise Discussed, Exercise Reviewed, Physical Activity, Assistive device use and maintanence  [Encouraged]  Physical Activity Discussed/Reviewed Physical Activity Discussed, Physical Activity Reviewed, Types of exercise, Home Exercise Program (HEP)  [Encouraged]  Education Interventions    Education Provided Provided Engineer, site, Provided Education  Provided Verbal Education On Nutrition, Foot Care, Eye Care, Blood Sugar Monitoring, Mental Health/Coping with Illness, Applications, Exercise, Medication, When to see the doctor, [employer], Personal assistant  [Encouraged]  [place] Discussed, Mental Health Reviewed, Coping Strategies, Crisis, Suicide, Substance Abuse, Grief and Loss, Depression, Anxiety  Nutrition Interventions   Nutrition Discussed/Reviewed Nutrition Discussed, Nutrition Reviewed, Fluid intake, Portion sizes, Decreasing sugar intake, Increaing proteins, Decreasing fats, Decreasing salt  Pharmacy Interventions   Pharmacy Dicussed/Reviewed Pharmacy Topics Discussed, Pharmacy Topics Reviewed, Medication Adherence, Affording Medications  Safety Interventions   Safety Discussed/Reviewed Safety Discussed, Safety Reviewed, Fall Risk, Home Safety  Home Safety Assistive Devices, Refer for community resources  Advanced Directive Interventions   Advanced Directives Discussed/Reviewed Advanced Directives Discussed, Advanced Directives Reviewed     Active listening & reflection utilized.  Verbalization of feelings encouraged.    Emotional support provided. Feelings of frustration regarding loss of independence validated. Caregiver stress acknowledged. Caregiver resources reviewed. Self enrollment in caregiver support group of interest emphasized. Assessed for suicidal & homicidal ideations - none present. Assessed for domestic violence & intent to inflict self-harm - none present. Assessed for weapons in the home - none present. Solution-focused interventions implemented. Task-centered strategies employed. Problem-solving solutions activated. Acceptance & Commitment Therapy indicated. Cognitive Behavioral Therapy initiated. Client-Centered Therapy performed. Deep Breathing Exercises,  Relaxation Techniques & Mindfulness Meditation Strategies encouraged daily. Please routinely follow-up with Dr. Debby Bud, Psychiatrist with [facility] [place] [phone]), to receive ongoing psychotherapeutic counseling services & psychotropic medication management. Please keep initial consult with Dr. Zelphia [place], Dentist with [facility] [phone]), to undergo dental surgery for teeth extraction, scheduled on 11/11/2022 at 10:00 am.       Our next appointment is by  telephone on 11/15/2022 at 2:00 pm.  Please call the care guide team at [phone] if you need to cancel or reschedule your appointment.   If you are experiencing a Mental Health or [place] or need someone to talk to, please call the Suicide and Crisis Lifeline: 988 call the [place] National Suicide Prevention Lifeline: 778-506-5948 or TTY: (640) 884-8483 TTY 226-003-8129) to talk to a trained counselor call 1-800-273-TALK (toll free, 24 hour hotline) go to [facility] [address] [place] [phone]) call the [place]: [phone] call 911  Patient verbalizes understanding of instructions and care plan provided today and agrees to view in [place]. Active MyChart status and patient understanding of how to access instructions and care plan via MyChart confirmed with patient.     Telephone follow up appointment with care management team member scheduled for:  11/15/2022 at 2:00 pm.  Aimee Phillips, BSW, MSW, [place]  Licensed Clinical Social Worker  [place]  Mailing [place]. [address] [place], [place] [address] Physical Address[address], [place], [place] [address] Toll Free Main # [phone] Fax # [phone] Cell # [phone] [email or web address]

## 2022-11-05 DIAGNOSIS — J449 Chronic obstructive pulmonary disease, unspecified: Secondary | ICD-10-CM | POA: Diagnosis not present

## 2022-11-15 ENCOUNTER — Encounter: Payer: Self-pay | Admitting: *Deleted

## 2022-11-15 ENCOUNTER — Ambulatory Visit: Payer: Self-pay | Admitting: *Deleted

## 2022-11-15 DIAGNOSIS — Z9181 History of falling: Secondary | ICD-10-CM | POA: Diagnosis not present

## 2022-11-15 DIAGNOSIS — E1165 Type 2 diabetes mellitus with hyperglycemia: Secondary | ICD-10-CM | POA: Diagnosis not present

## 2022-11-15 DIAGNOSIS — E559 Vitamin D deficiency, unspecified: Secondary | ICD-10-CM | POA: Diagnosis not present

## 2022-11-15 DIAGNOSIS — G894 Chronic pain syndrome: Secondary | ICD-10-CM | POA: Diagnosis not present

## 2022-11-15 DIAGNOSIS — M545 Low back pain, unspecified: Secondary | ICD-10-CM | POA: Diagnosis not present

## 2022-11-15 DIAGNOSIS — Z79899 Other long term (current) drug therapy: Secondary | ICD-10-CM | POA: Diagnosis not present

## 2022-11-15 DIAGNOSIS — G629 Polyneuropathy, unspecified: Secondary | ICD-10-CM | POA: Diagnosis not present

## 2022-11-15 NOTE — Patient Outreach (Signed)
Care Coordination   Follow Up Visit Note   11/15/2022  Name: Aimee Phillips MRN: [id number] DOB: 01-22-1971  Aimee Phillips is a 52 y.o. year old female who sees Aimee Norlander, DO for primary care. I spoke with Aimee Phillips by phone today.  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   Reduce and Manage Symptoms of Anxiety and Depression.   Goals Addressed               This Visit's Progress     COMPLETED: Reduce and Manage Symptoms of Anxiety and Depression. (pt-stated)   On track     Care Coordination Interventions:  Interventions Today    Flowsheet Row Most Recent Value  Chronic Disease   Chronic disease during today's visit Hypertension (HTN), Diabetes, Chronic Obstructive Pulmonary Disease (COPD), Other  [Morbid Obesity, Chronic Opiate Therapy, Post-Traumatic Stress Disorder, Major Depressive Disorder, Recurrent Episode, Severe, Generalized Anxiety Disorder with Panic Attacks & Marital Conflicts.]  General Interventions   General Interventions Discussed/Reviewed General Interventions Discussed, General Interventions Reviewed, Annual Eye Exam, Labs, Annual Foot Exam, Durable Medical Equipment (DME), Vaccines, Health Screening, [employer], Doctor Visits, Communication with  Aimee Phillips Care Provider]  Labs Hgb A1c annually  Vaccines COVID-19, Flu, Pneumonia, RSV, Shingles, Tetanus/Pertussis/Diphtheria  [Encouraged]  Doctor Visits Discussed/Reviewed Doctor Visits Discussed, Doctor Visits Reviewed, Annual Wellness Visits, PCP, Specialist  [Encouraged]  Health Screening Colonoscopy, Mammogram  [Encouraged]  Durable Medical Equipment (DME) BP Cuff, Glucomoter, Oxygen, Environmental consultant, Community education officer  PCP/Specialist Visits Compliance with follow-up visit  Communication with PCP/Specialists, RN  Exercise Interventions   Exercise Discussed/Reviewed Exercise Discussed, Exercise Reviewed, Physical Activity, Assistive device use and maintanence   [Encouraged]  Physical Activity Discussed/Reviewed Physical Activity Discussed, Physical Activity Reviewed, Types of exercise, Home Exercise Program (HEP)  [Encouraged]  Education Interventions   Education Provided Provided Engineer, site, Provided Education  Provided Verbal Education On Nutrition, Foot Care, Eye Care, Blood Sugar Monitoring, Mental Health/Coping with Illness, Applications, Exercise, Medication, When to see the doctor, [employer], Personal assistant  [Encouraged]  [place] Discussed, Mental Health Reviewed, Coping Strategies, Crisis, Suicide, Substance Abuse, Grief and Loss, Depression, Anxiety  Nutrition Interventions   Nutrition Discussed/Reviewed Nutrition Discussed, Nutrition Reviewed, Fluid intake, Portion sizes, Decreasing sugar intake, Increaing proteins, Decreasing fats, Decreasing salt  Pharmacy Interventions   Pharmacy Dicussed/Reviewed Pharmacy Topics Discussed, Pharmacy Topics Reviewed, Medication Adherence, Affording Medications  Safety Interventions   Safety Discussed/Reviewed Safety Discussed, Safety Reviewed, Fall Risk, Home Safety  Home Safety Assistive Devices, Refer for community resources  Advanced Directive Interventions   Advanced Directives Discussed/Reviewed Advanced Directives Discussed, Advanced Directives Reviewed     Active listening & reflection utilized.  Verbalization of feelings encouraged.    Emotional support provided. Feelings of frustration regarding loss of independence validated. Caregiver stress acknowledged. Caregiver resources reviewed. Self enrollment in caregiver support group of interest emphasized. Assessed for suicidal & homicidal ideations - none present. Assessed for domestic violence & intent to inflict self-harm - none present. Assessed for weapons in the home - none present. Solution-focused interventions implemented. Task-centered strategies  employed. Problem-solving solutions activated. Acceptance & Commitment Therapy indicated. Cognitive Behavioral Therapy initiated. Client-Centered Therapy performed. Deep Breathing Exercises, Relaxation Techniques & Mindfulness Meditation Strategies encouraged daily. Please keep follow-up appointments with Dr. Debby Phillips, Psychiatrist with [facility] at [place] [phone]), for ongoing psychotropic medication management. Please keep follow-up appointments with Aimee Phillips, Licensed Clinical  Social Worker with [facility] [place] [phone]), for ongoing psychotherapeutic counseling & supportive services. Please contact CSW directly (# [phone]), if you have questions, need assistance, or if additional social work needs are identified in the near future.       SDOH assessments and interventions completed:  Yes.  Care Coordination Interventions:  Yes, provided.   Follow up plan: No further intervention required.   Encounter Outcome:  Pt. Visit Completed.   Aimee Phillips, BSW, MSW, LCSW  Licensed Education officer, environmental Health System  Mailing [place] [address], [place], [place] [address] Physical Address[address], [place], [place] [address] Toll Free Main # [phone] Fax # [phone] Cell # [phone] [email or web address]

## 2022-11-15 NOTE — Patient Instructions (Signed)
Visit Information  Thank you for taking time to visit with me today. Please don't hesitate to contact me if I can be of assistance to you.   Following are the goals we discussed today:   Goals Addressed               This Visit's Progress     COMPLETED: Reduce and Manage Symptoms of Anxiety and Depression. (pt-stated)   On track     Care Coordination Interventions:  Interventions Today    Flowsheet Row Most Recent Value  Chronic Disease   Chronic disease during today's visit Hypertension (HTN), Diabetes, Chronic Obstructive Pulmonary Disease (COPD), Other  [Morbid Obesity, Chronic Opiate Therapy, Post-Traumatic Stress Disorder, Major Depressive Disorder, Recurrent Episode, Severe, Generalized Anxiety Disorder with Panic Attacks & Marital Conflicts.]  General Interventions   General Interventions Discussed/Reviewed General Interventions Discussed, General Interventions Reviewed, Annual Eye Exam, Labs, Annual Foot Exam, Durable Medical Equipment (DME), Vaccines, Health Screening, [employer], Doctor Visits, Communication with  [name]]  Labs Hgb A1c annually  Vaccines COVID-19, Flu, Pneumonia, RSV, Shingles, Tetanus/Pertussis/Diphtheria  [Encouraged]  Doctor Visits Discussed/Reviewed Doctor Visits Discussed, Doctor Visits Reviewed, Annual Wellness Visits, PCP, Specialist  [Encouraged]  Health Screening Colonoscopy, Mammogram  [Encouraged]  Durable Medical Equipment (DME) BP Cuff, Glucomoter, Oxygen, Environmental consultant, Community education officer  PCP/Specialist Visits Compliance with follow-up visit  Communication with PCP/Specialists, RN  Exercise Interventions   Exercise Discussed/Reviewed Exercise Discussed, Exercise Reviewed, Physical Activity, Assistive device use and maintanence  [Encouraged]  Physical Activity Discussed/Reviewed Physical Activity Discussed, Physical Activity Reviewed, Types of exercise, Home Exercise Program (HEP)  [Encouraged]  Education  Interventions   Education Provided Provided Engineer, site, Provided Education  Provided Verbal Education On Nutrition, Foot Care, Eye Care, Blood Sugar Monitoring, Mental Health/Coping with Illness, Applications, Exercise, Medication, When to see the doctor, [employer], Personal assistant  [Encouraged]  [place] Discussed, Mental Health Reviewed, Coping Strategies, Crisis, Suicide, Substance Abuse, Grief and Loss, Depression, Anxiety  Nutrition Interventions   Nutrition Discussed/Reviewed Nutrition Discussed, Nutrition Reviewed, Fluid intake, Portion sizes, Decreasing sugar intake, Increaing proteins, Decreasing fats, Decreasing salt  Pharmacy Interventions   Pharmacy Dicussed/Reviewed Pharmacy Topics Discussed, Pharmacy Topics Reviewed, Medication Adherence, Affording Medications  Safety Interventions   Safety Discussed/Reviewed Safety Discussed, Safety Reviewed, Fall Risk, Home Safety  Home Safety Assistive Devices, Refer for community resources  Advanced Directive Interventions   Advanced Directives Discussed/Reviewed Advanced Directives Discussed, Advanced Directives Reviewed     Active listening & reflection utilized.  Verbalization of feelings encouraged.    Emotional support provided. Feelings of frustration regarding loss of independence validated. Caregiver stress acknowledged. Caregiver resources reviewed. Self enrollment in caregiver support group of interest emphasized. Assessed for suicidal & homicidal ideations - none present. Assessed for domestic violence & intent to inflict self-harm - none present. Assessed for weapons in the home - none present. Solution-focused interventions implemented. Task-centered strategies employed. Problem-solving solutions activated. Acceptance & Commitment Therapy indicated. Cognitive Behavioral Therapy initiated. Client-Centered Therapy performed. Deep Breathing  Exercises, Relaxation Techniques & Mindfulness Meditation Strategies encouraged daily. Please keep follow-up appointments with Dr. Debby Bud, Psychiatrist with [facility] [place] [phone]), for ongoing psychotropic medication management. Please keep follow-up appointments with Maye Hides, Licensed Clinical Social Worker with [facility] at [place] [phone]), for ongoing psychotherapeutic counseling & supportive services. Please contact CSW directly (# [phone]), if you have questions, need assistance, or if  additional social work needs are identified in the near future.       Please call the care guide team at [phone] if you need to cancel or reschedule your appointment.   If you are experiencing a Mental Health or [place] or need someone to talk to, please call the Suicide and Crisis Lifeline: 988 call the [place] National Suicide Prevention Lifeline: 431-795-7992 or TTY: 4173851381 TTY 847-328-5609) to talk to a trained counselor call 1-800-273-TALK (toll free, 24 hour hotline) go to [facility] [address] [place] [phone]) call the [place]: [phone] call 911  Patient verbalizes understanding of instructions and care plan provided today and agrees to view in [place]. Active MyChart status and patient understanding of how to access instructions and care plan via MyChart confirmed with patient.     No further follow up required.  Nat Christen, BSW, MSW, LCSW  Licensed Education officer, environmental Health System  Mailing [place] [address] [place], [place] [address] Physical Address[address] [place], [place] [address] Toll Free Main # [phone] Fax # [phone] Cell # [phone] [email or web address]

## 2022-11-18 DIAGNOSIS — M549 Dorsalgia, unspecified: Secondary | ICD-10-CM | POA: Diagnosis not present

## 2022-11-18 DIAGNOSIS — M79671 Pain in right foot: Secondary | ICD-10-CM | POA: Diagnosis not present

## 2022-11-18 DIAGNOSIS — M79606 Pain in leg, unspecified: Secondary | ICD-10-CM | POA: Diagnosis not present

## 2022-11-18 DIAGNOSIS — M79672 Pain in left foot: Secondary | ICD-10-CM | POA: Diagnosis not present

## 2022-11-20 DIAGNOSIS — Z79899 Other long term (current) drug therapy: Secondary | ICD-10-CM | POA: Diagnosis not present

## 2022-11-21 ENCOUNTER — Other Ambulatory Visit: Payer: Self-pay | Admitting: Family Medicine

## 2022-11-24 ENCOUNTER — Ambulatory Visit: Payer: 59 | Admitting: Family Medicine

## 2022-11-27 ENCOUNTER — Ambulatory Visit (INDEPENDENT_AMBULATORY_CARE_PROVIDER_SITE_OTHER): Payer: 59 | Admitting: Clinical

## 2022-11-27 DIAGNOSIS — F4310 Post-traumatic stress disorder, unspecified: Secondary | ICD-10-CM

## 2022-11-27 DIAGNOSIS — F331 Major depressive disorder, recurrent, moderate: Secondary | ICD-10-CM | POA: Diagnosis not present

## 2022-11-27 DIAGNOSIS — F419 Anxiety disorder, unspecified: Secondary | ICD-10-CM

## 2022-11-27 NOTE — Progress Notes (Signed)
Virtual Visit via Video Note  I connected with Aimee Phillips on 11/27/22 at  1:00 PM EDT by a video enabled telemedicine application and verified that I am speaking with the correct person using two identifiers.  Location: Patient: Home  Provider: Office   I discussed the limitations of evaluation and management by telemedicine and the availability of in person appointments. The patient expressed understanding and agreed to proceed.   THERAPIST PROGRESS NOTE     Session Time: 1:00 PM-1:30 PM   Participation Level: Active   Behavioral Response: Casual and Alert,Depressed   Type of Therapy: Individual Therapy   Treatment Goals addressed: Depression and Anxiety / PTSD   Interventions: CBT   Summary: Aimee Phillips. [name] a 52 y.o. 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 The OPT therapist worked with the patient for her OPT treatment. The OPT therapist utilized Motivational Interviewing to assist in creating therapeutic repore. The patient in the session was engaged and work in collaboration giving feedback about her triggers and symptoms over the past few weeks. The patient spoke working to be able to take a vacation and go stay with her sister in [place] for 2 weeks. The OPT therapist utilized Cognitive Behavioral Therapy through cognitive restructuring as well as worked with the patient on coping strategies for the change of season to assist in management of mood and anxiety.The OPT therapist reviewed as well with the patient basic need areas examining the patients current eating habits, sleep schedule, exercise, and hygiene. The patient noted a change with her past psychiatry appointment in her med management, but does not feel the change is working. The patient did not have a scheduled follow up with Dr. Nehemiah Settle and was encouraged to call in to set a follow up.    Suicidal/Homicidal: Nowithout intent/plan   Therapist Response:The OPT therapist worked  with the patient for the patients scheduled session. The patient was engaged in her session and gave feedback in relation to triggers, symptoms, and behavior responses over the past few weeks. The OPT therapist worked with the patient utilizing an in session Cognitive Behavioral Therapy exercise. The patient was responsive in the session and verbalized, " I am working to be able to go to [employer] for my sisters upcoming birthday and if I go I plan to stay for around 2 weeks". The OPT therapist worked with the patient providing ongoing psycho-education. The OPT therapist worked with the patient evaluating her use of coping to balance her external stressors. The patient identified she feels her medication is not working as her Depression symptoms are still high.The patient spoke about her med management and the OPT therapist over-viewed upcoming appointments as listed in the patients MyChart. Patient was encouraged to set a follow up appointment for Dr. Nehemiah Settle The [place] therapist will continue treatment work with the patient in her next scheduled session    Plan: return in 2/3 weeks    Diagnosis:      Axis I: Recurrent Moderate MDD with Anxiety  Axis II: No diagnosis       Collaboration of Care: Overview of patient involvement in the Med Management program with Dr. Nehemiah Settle.   Patient/Guardian was advised Release of Information must be obtained prior to any record release in order to collaborate their care with an outside provider. Patient/Guardian was advised if they have not already done so to contact the registration department to sign all necessary forms in order for [name] to release information  regarding their care.    Consent: Patient/Guardian gives verbal consent for treatment and assignment of benefits for services provided during this visit. Patient/Guardian expressed understanding and agreed to proceed.    I discussed the assessment and treatment plan with the patient. The patient was provided an  opportunity to ask questions and all were answered. The patient agreed with the plan and demonstrated an understanding of the instructions.   The patient was advised to call back or seek an in-person evaluation if the symptoms worsen or if the condition fails to improve as anticipated.   I provided 30 minutes of non-face-to-face time during this encounter.   Aimee Grumbles, LCSW   11/27/2022

## 2022-11-29 ENCOUNTER — Encounter (HOSPITAL_COMMUNITY): Payer: Self-pay | Admitting: Psychiatry

## 2022-11-29 ENCOUNTER — Telehealth (INDEPENDENT_AMBULATORY_CARE_PROVIDER_SITE_OTHER): Payer: 59 | Admitting: Psychiatry

## 2022-11-29 ENCOUNTER — Other Ambulatory Visit: Payer: Self-pay | Admitting: Family Medicine

## 2022-11-29 DIAGNOSIS — F332 Major depressive disorder, recurrent severe without psychotic features: Secondary | ICD-10-CM | POA: Diagnosis not present

## 2022-11-29 DIAGNOSIS — Z79899 Other long term (current) drug therapy: Secondary | ICD-10-CM

## 2022-11-29 DIAGNOSIS — R45851 Suicidal ideations: Secondary | ICD-10-CM | POA: Diagnosis not present

## 2022-11-29 DIAGNOSIS — F411 Generalized anxiety disorder: Secondary | ICD-10-CM | POA: Diagnosis not present

## 2022-11-29 DIAGNOSIS — Z79891 Long term (current) use of opiate analgesic: Secondary | ICD-10-CM

## 2022-11-29 DIAGNOSIS — E559 Vitamin D deficiency, unspecified: Secondary | ICD-10-CM

## 2022-11-29 DIAGNOSIS — F410 Panic disorder [episodic paroxysmal anxiety] without agoraphobia: Secondary | ICD-10-CM

## 2022-11-29 DIAGNOSIS — G8929 Other chronic pain: Secondary | ICD-10-CM

## 2022-11-29 DIAGNOSIS — F4310 Post-traumatic stress disorder, unspecified: Secondary | ICD-10-CM

## 2022-11-29 DIAGNOSIS — H9201 Otalgia, right ear: Secondary | ICD-10-CM

## 2022-11-29 DIAGNOSIS — G4733 Obstructive sleep apnea (adult) (pediatric): Secondary | ICD-10-CM

## 2022-11-29 MED ORDER — MIRTAZAPINE 7.5 MG PO TABS: 7.5000 mg | ORAL_TABLET | Freq: Every day | ORAL | 1 refills | Status: DC

## 2022-11-29 MED ORDER — DULOXETINE HCL 60 MG PO CPEP: 120.0000 mg | ORAL_CAPSULE | Freq: Every day | ORAL | 1 refills | Status: DC

## 2022-11-29 NOTE — Progress Notes (Signed)
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 non-face-to-face time during this encounter including significant safety planning and consideration for hospitalization.  Jacquelynn Cree, MD 11/29/2022, 10:12 AM
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
## 2022-12-04 DIAGNOSIS — J449 Chronic obstructive pulmonary disease, unspecified: Secondary | ICD-10-CM | POA: Diagnosis not present

## 2022-12-14 DIAGNOSIS — G894 Chronic pain syndrome: Secondary | ICD-10-CM | POA: Diagnosis not present

## 2022-12-14 DIAGNOSIS — Z9181 History of falling: Secondary | ICD-10-CM | POA: Diagnosis not present

## 2022-12-14 DIAGNOSIS — G629 Polyneuropathy, unspecified: Secondary | ICD-10-CM | POA: Diagnosis not present

## 2022-12-14 DIAGNOSIS — E559 Vitamin D deficiency, unspecified: Secondary | ICD-10-CM | POA: Diagnosis not present

## 2022-12-14 DIAGNOSIS — E1165 Type 2 diabetes mellitus with hyperglycemia: Secondary | ICD-10-CM | POA: Diagnosis not present

## 2022-12-14 DIAGNOSIS — Z79899 Other long term (current) drug therapy: Secondary | ICD-10-CM | POA: Diagnosis not present

## 2022-12-14 DIAGNOSIS — M545 Low back pain, unspecified: Secondary | ICD-10-CM | POA: Diagnosis not present

## 2022-12-19 DIAGNOSIS — M549 Dorsalgia, unspecified: Secondary | ICD-10-CM | POA: Diagnosis not present

## 2022-12-19 DIAGNOSIS — M79672 Pain in left foot: Secondary | ICD-10-CM | POA: Diagnosis not present

## 2022-12-19 DIAGNOSIS — M79671 Pain in right foot: Secondary | ICD-10-CM | POA: Diagnosis not present

## 2022-12-19 DIAGNOSIS — M79606 Pain in leg, unspecified: Secondary | ICD-10-CM | POA: Diagnosis not present

## 2022-12-23 ENCOUNTER — Other Ambulatory Visit: Payer: Self-pay | Admitting: Family Medicine

## 2022-12-26 ENCOUNTER — Telehealth: Payer: Self-pay | Admitting: Family Medicine

## 2022-12-26 ENCOUNTER — Other Ambulatory Visit (HOSPITAL_COMMUNITY): Payer: Self-pay

## 2022-12-26 ENCOUNTER — Other Ambulatory Visit: Payer: Self-pay | Admitting: Family Medicine

## 2022-12-26 DIAGNOSIS — E1165 Type 2 diabetes mellitus with hyperglycemia: Secondary | ICD-10-CM

## 2022-12-26 MED ORDER — SEMAGLUTIDE (2 MG/DOSE) 8 MG/3ML ~~LOC~~ SOPN: 2.0000 mg | PEN_INJECTOR | SUBCUTANEOUS | 12 refills | Status: DC

## 2022-12-26 NOTE — Patient Instructions (Signed)
Our records indicate that you are due for your annual mammogram/breast imaging. While there is no way to prevent breast cancer, early detection provides the best opportunity for curing it. For women over the age of 40, the American Cancer Society recommends a yearly clinical breast exam and a yearly mammogram. These practices have saved thousands of lives. We need your help to ensure that you are receiving optimal medical care. Please call the imaging location that has done you previous mammograms. Please remember to list us as your primary care. This helps make sure we receive a report and can update your chart.  Below is the contact information for several local breast imaging centers. You may call the location that works best for you, and they will be happy to assistance in making you an appointment. You do not need an order for a regular screening mammogram. However, if you are having any problems or concerns with you breast area, please let your primary care provider know, and appropriate orders will be placed. Please let our office know if you have any questions or concerns. Or if you need information for another imaging center not on this list or outside of the area. We are commented to working with you on your health care journey.   The mobile unit/bus ([facility]) - they come twice a month to our location.  These appointments can be made through our office or by call The Breast Center  [facility]  [address] [place], [place] [address] Phone [phone]  [facility]  [address]  [place], [place] [address] [phone]  [facility]  [address] [place], [place] [address] [phone]  [facility] [place]  [address] [place] [address] [phone]  [facility]  [address] [place] [address] [phone]  [facility] [place]  [address] [place], [place] [address] [phone]  [facility] [address] [place], [place] [address] [phone]  [facility] [address]  [address] [place], [place] [address] [phone]  [facility] [address] [place] [address] [phone]     

## 2022-12-26 NOTE — Telephone Encounter (Signed)
  Prescription Request  12/26/2022  Is this a "Controlled Substance" medicine? no  Have you seen your PCP in the last 2 weeks? No has appt 04/30  If YES, route message to pool  -  If NO, patient needs to be scheduled for appointment.  What is the name of the medication or equipment? Ozempic pt will be out next week she would like a 3 month supply  Have you contacted your pharmacy to request a refill? yes   Which pharmacy would you like this sent to? [facility]    Patient notified that their request is being sent to the clinical staff for review and that they should receive a response within 2 business days.

## 2022-12-26 NOTE — Telephone Encounter (Signed)
sent 

## 2022-12-27 ENCOUNTER — Ambulatory Visit: Payer: Self-pay | Admitting: *Deleted

## 2022-12-27 ENCOUNTER — Encounter: Payer: Self-pay | Admitting: *Deleted
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
## 2022-12-29 ENCOUNTER — Other Ambulatory Visit: Payer: Self-pay | Admitting: Family Medicine

## 2022-12-29 DIAGNOSIS — H9201 Otalgia, right ear: Secondary | ICD-10-CM

## 2023-01-01 ENCOUNTER — Ambulatory Visit (INDEPENDENT_AMBULATORY_CARE_PROVIDER_SITE_OTHER): Payer: 59 | Admitting: Clinical

## 2023-01-01 DIAGNOSIS — F331 Major depressive disorder, recurrent, moderate: Secondary | ICD-10-CM | POA: Diagnosis not present

## 2023-01-01 DIAGNOSIS — F419 Anxiety disorder, unspecified: Secondary | ICD-10-CM | POA: Diagnosis not present

## 2023-01-01 NOTE — Progress Notes (Signed)
Virtual Visit via Video Note   I connected with [name] on 01/01/23 at  2:00 PM EDT by a video enabled telemedicine application and verified that I am speaking with the correct person using two identifiers.   Location: Patient: Home  Provider: Office   I discussed the limitations of evaluation and management by telemedicine and the availability of in person appointments. The patient expressed understanding and agreed to proceed.     THERAPIST PROGRESS NOTE     Session Time: 2:00 PM-2:30 PM   Participation Level: Active   Behavioral Response: Casual and Alert,Depressed   Type of Therapy: Individual Therapy   Treatment Goals addressed: Depression and Anxiety / PTSD   Interventions: CBT   Summary: Aimee Phillips. Aimee Phillips a 52 y.o. 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 The OPT therapist worked with the patient for her OPT treatment. The OPT therapist utilized Motivational Interviewing to assist in creating therapeutic repore. The patient in the session was engaged and work in collaboration giving feedback about her triggers and symptoms over the past few weeks. The patient spoke about continued plan to be able to take a vacation and go stay with her sister in [place] for 2 weeks. The patient indicated she feels her medicine is "not working" and has a follow up with her med provider tomorrow where she will be requesting a med change.The OPT therapist utilized Cognitive Behavioral Therapy through cognitive restructuring as well as worked with the patient on coping strategies for the change of season to assist in management of mood and anxiety including increased mobility and change of environment getting outside the home more frequently.The OPT therapist reviewed as well with the patient basic need areas examining the patients current eating habits, sleep schedule, exercise, and hygiene. . The patient has a scheduled follow up with Dr. Adrian Blackwater tomorrow.    Suicidal/Homicidal: Nowithout intent/plan   Therapist Response:The OPT therapist worked with the patient for the patients scheduled session. The patient was engaged in her session and gave feedback in relation to triggers, symptoms, and behavior responses over the past few weeks. The OPT therapist worked with the patient utilizing an in session Cognitive Behavioral Therapy exercise. The patient was responsive in the session and verbalized, " Things I feel like have been the same I have been on my medicine long enough and I do not feel like it is working". The OPT therapist worked with the patient providing ongoing psycho-education. The OPT therapist worked with the patient evaluating her use of coping to balance her external stressors. The patient identified she feels her medication is not working as her Depression symptoms are still high and will be requesting a med change tomorrow at her upcoming med management appointment with Dr. Adrian Blackwater.The OPT therapist worked with the patient on implementing within her physical health limits more activity/exercise. The OPT therapist will continue treatment work with the patient in her next scheduled session    Plan: return in 2/3 weeks    Diagnosis:      Axis I: Recurrent Moderate MDD with Anxiety  Axis II: No diagnosis       Collaboration of Care: Overview of patient involvement in the Med Management program with Dr. Adrian Blackwater.   Patient/Guardian was advised Release of Information must be obtained prior to any record release in order to collaborate their care with an outside provider. Patient/Guardian was advised if they have not already done so to contact the registration department to  sign all necessary forms in order for [name] to release information regarding their care.    Consent: Patient/Guardian gives verbal consent for treatment and assignment of benefits for services provided during this visit. Patient/Guardian expressed understanding and agreed to  proceed.    I discussed the assessment and treatment plan with the patient. The patient was provided an opportunity to ask questions and all were answered. The patient agreed with the plan and demonstrated an understanding of the instructions.   The patient was advised to call back or seek an in-person evaluation if the symptoms worsen or if the condition fails to improve as anticipated.   I provided 30 minutes of non-face-to-face time during this encounter.   Winfred Burn, LCSW   01/01/2023

## 2023-01-02 ENCOUNTER — Encounter (HOSPITAL_COMMUNITY): Payer: Self-pay | Admitting: Psychiatry

## 2023-01-02 ENCOUNTER — Telehealth (INDEPENDENT_AMBULATORY_CARE_PROVIDER_SITE_OTHER): Payer: 59 | Admitting: Psychiatry

## 2023-01-02 DIAGNOSIS — F411 Generalized anxiety disorder: Secondary | ICD-10-CM

## 2023-01-02 DIAGNOSIS — F4310 Post-traumatic stress disorder, unspecified: Secondary | ICD-10-CM | POA: Diagnosis not present

## 2023-01-02 DIAGNOSIS — R45851 Suicidal ideations: Secondary | ICD-10-CM

## 2023-01-02 DIAGNOSIS — F332 Major depressive disorder, recurrent severe without psychotic features: Secondary | ICD-10-CM | POA: Diagnosis not present

## 2023-01-02 DIAGNOSIS — M545 Low back pain, unspecified: Secondary | ICD-10-CM

## 2023-01-02 DIAGNOSIS — F410 Panic disorder [episodic paroxysmal anxiety] without agoraphobia: Secondary | ICD-10-CM

## 2023-01-02 DIAGNOSIS — G8929 Other chronic pain: Secondary | ICD-10-CM

## 2023-01-02 MED ORDER — DULOXETINE HCL 60 MG PO CPEP: 120.0000 mg | ORAL_CAPSULE | Freq: Every day | ORAL | 2 refills | Status: DC

## 2023-01-02 MED ORDER — MIRTAZAPINE 15 MG PO TABS: 15.0000 mg | ORAL_TABLET | Freq: Every day | ORAL | 2 refills | Status: DC

## 2023-01-02 NOTE — Patient Instructions (Signed)
We increased the Remeron to 15 mg nightly.  Continue to be on the lookout for signs and symptoms of serotonin syndrome which include flushing, sweating, heart palpitations, tremulousness.  To begin the process of looking for a new provider in [place] soon as possible.
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
## 2023-01-04 DIAGNOSIS — J449 Chronic obstructive pulmonary disease, unspecified: Secondary | ICD-10-CM | POA: Diagnosis not present

## 2023-01-09 ENCOUNTER — Ambulatory Visit (INDEPENDENT_AMBULATORY_CARE_PROVIDER_SITE_OTHER): Payer: 59 | Admitting: Family Medicine

## 2023-01-09 ENCOUNTER — Encounter: Payer: Self-pay | Admitting: Family Medicine

## 2023-01-09 VITALS — BP 120/77 | HR 72 | Temp 98.3°F | Ht 68.0 in | Wt 297.0 lb

## 2023-01-09 DIAGNOSIS — Z1211 Encounter for screening for malignant neoplasm of colon: Secondary | ICD-10-CM | POA: Diagnosis not present

## 2023-01-09 DIAGNOSIS — G44229 Chronic tension-type headache, not intractable: Secondary | ICD-10-CM | POA: Diagnosis not present

## 2023-01-09 DIAGNOSIS — E785 Hyperlipidemia, unspecified: Secondary | ICD-10-CM | POA: Diagnosis not present

## 2023-01-09 DIAGNOSIS — K5909 Other constipation: Secondary | ICD-10-CM

## 2023-01-09 DIAGNOSIS — E1159 Type 2 diabetes mellitus with other circulatory complications: Secondary | ICD-10-CM | POA: Diagnosis not present

## 2023-01-09 DIAGNOSIS — E559 Vitamin D deficiency, unspecified: Secondary | ICD-10-CM | POA: Diagnosis not present

## 2023-01-09 DIAGNOSIS — F332 Major depressive disorder, recurrent severe without psychotic features: Secondary | ICD-10-CM

## 2023-01-09 DIAGNOSIS — I152 Hypertension secondary to endocrine disorders: Secondary | ICD-10-CM | POA: Diagnosis not present

## 2023-01-09 DIAGNOSIS — E1169 Type 2 diabetes mellitus with other specified complication: Secondary | ICD-10-CM

## 2023-01-09 LAB — BAYER DCA HB A1C WAIVED: HB A1C (BAYER DCA - WAIVED): 6.3 % — ABNORMAL HIGH (ref 4.8–5.6)

## 2023-01-09 MED ORDER — LANCET DEVICES MISC: 99 refills | Status: DC

## 2023-01-09 MED ORDER — LISINOPRIL 5 MG PO TABS: 5.0000 mg | ORAL_TABLET | Freq: Every day | ORAL | 3 refills | Status: DC

## 2023-01-09 MED ORDER — GLUCOSE BLOOD VI STRP: ORAL_STRIP | 3 refills | Status: DC

## 2023-01-09 MED ORDER — ROSUVASTATIN CALCIUM 20 MG PO TABS: ORAL_TABLET | ORAL | 3 refills | Status: DC

## 2023-01-09 MED ORDER — SEMAGLUTIDE (2 MG/DOSE) 8 MG/3ML ~~LOC~~ SOPN
2.0000 mg | PEN_INJECTOR | SUBCUTANEOUS | 3 refills | Status: DC
Start: 2023-01-09 — End: 2024-02-28

## 2023-01-09 MED ORDER — BUDESONIDE-FORMOTEROL FUMARATE 160-4.5 MCG/ACT IN AERO: 2.0000 | INHALATION_SPRAY | Freq: Two times a day (BID) | RESPIRATORY_TRACT | 11 refills | Status: AC

## 2023-01-09 MED ORDER — LINACLOTIDE 290 MCG PO CAPS
290.0000 ug | ORAL_CAPSULE | Freq: Every day | ORAL | 12 refills | Status: DC
Start: 2023-01-09 — End: 2024-01-28
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
## 2023-01-10 LAB — MICROALBUMIN / CREATININE URINE RATIO
Creatinine, Urine: 192.3 mg/dL
Microalb/Creat Ratio: 11 mg/g creat (ref 0–29)
Microalbumin, Urine: 21 ug/mL

## 2023-01-10 LAB — HEPATIC FUNCTION PANEL
ALT: 22 IU/L (ref 0–32)
AST: 44 IU/L — ABNORMAL HIGH (ref 0–40)
Albumin: 4 g/dL (ref 3.8–4.9)
Alkaline Phosphatase: 88 IU/L (ref 44–121)
Bilirubin Total: <0.2 mg/dL (ref 0.0–1.2)
Bilirubin, Direct: <0.1 mg/dL (ref 0.00–0.40)
Total Protein: 7.2 g/dL (ref 6.0–8.5)

## 2023-01-10 LAB — VITAMIN D 25 HYDROXY (VIT D DEFICIENCY, FRACTURES): Vit D, 25-Hydroxy: 37.2 ng/mL (ref 30.0–100.0)

## 2023-01-11 DIAGNOSIS — G629 Polyneuropathy, unspecified: Secondary | ICD-10-CM | POA: Diagnosis not present

## 2023-01-11 DIAGNOSIS — G894 Chronic pain syndrome: Secondary | ICD-10-CM | POA: Diagnosis not present

## 2023-01-11 DIAGNOSIS — M545 Low back pain, unspecified: Secondary | ICD-10-CM | POA: Diagnosis not present

## 2023-01-11 DIAGNOSIS — E11618 Type 2 diabetes mellitus with other diabetic arthropathy: Secondary | ICD-10-CM | POA: Diagnosis not present

## 2023-01-11 DIAGNOSIS — E559 Vitamin D deficiency, unspecified: Secondary | ICD-10-CM | POA: Diagnosis not present

## 2023-01-11 DIAGNOSIS — Z9181 History of falling: Secondary | ICD-10-CM | POA: Diagnosis not present

## 2023-01-11 DIAGNOSIS — Z79899 Other long term (current) drug therapy: Secondary | ICD-10-CM | POA: Diagnosis not present

## 2023-01-12 DIAGNOSIS — Z1211 Encounter for screening for malignant neoplasm of colon: Secondary | ICD-10-CM | POA: Diagnosis not present

## 2023-01-15 DIAGNOSIS — Z79899 Other long term (current) drug therapy: Secondary | ICD-10-CM | POA: Diagnosis not present

## 2023-01-18 DIAGNOSIS — M79606 Pain in leg, unspecified: Secondary | ICD-10-CM | POA: Diagnosis not present

## 2023-01-18 DIAGNOSIS — M79671 Pain in right foot: Secondary | ICD-10-CM | POA: Diagnosis not present

## 2023-01-18 DIAGNOSIS — M549 Dorsalgia, unspecified: Secondary | ICD-10-CM | POA: Diagnosis not present

## 2023-01-18 DIAGNOSIS — M79672 Pain in left foot: Secondary | ICD-10-CM | POA: Diagnosis not present

## 2023-01-21 LAB — COLOGUARD: COLOGUARD: NEGATIVE

## 2023-01-29 ENCOUNTER — Ambulatory Visit (HOSPITAL_COMMUNITY): Payer: 59 | Admitting: Clinical

## 2023-01-29 ENCOUNTER — Telehealth (HOSPITAL_COMMUNITY): Payer: Self-pay | Admitting: Clinical

## 2023-01-29 NOTE — Telephone Encounter (Signed)
Patient did not respond to contact attempts 

## 2023-02-03 DIAGNOSIS — J449 Chronic obstructive pulmonary disease, unspecified: Secondary | ICD-10-CM | POA: Diagnosis not present

## 2023-02-13 DIAGNOSIS — Z9181 History of falling: Secondary | ICD-10-CM | POA: Diagnosis not present

## 2023-02-13 DIAGNOSIS — E1165 Type 2 diabetes mellitus with hyperglycemia: Secondary | ICD-10-CM | POA: Diagnosis not present

## 2023-02-13 DIAGNOSIS — G629 Polyneuropathy, unspecified: Secondary | ICD-10-CM | POA: Diagnosis not present

## 2023-02-13 DIAGNOSIS — E559 Vitamin D deficiency, unspecified: Secondary | ICD-10-CM | POA: Diagnosis not present

## 2023-02-13 DIAGNOSIS — M419 Scoliosis, unspecified: Secondary | ICD-10-CM | POA: Diagnosis not present

## 2023-02-13 DIAGNOSIS — M545 Low back pain, unspecified: Secondary | ICD-10-CM | POA: Diagnosis not present

## 2023-02-13 DIAGNOSIS — S22080A Wedge compression fracture of T11-T12 vertebra, initial encounter for closed fracture: Secondary | ICD-10-CM | POA: Diagnosis not present

## 2023-02-13 DIAGNOSIS — Z79899 Other long term (current) drug therapy: Secondary | ICD-10-CM | POA: Diagnosis not present

## 2023-02-13 DIAGNOSIS — G894 Chronic pain syndrome: Secondary | ICD-10-CM | POA: Diagnosis not present

## 2023-02-16 DIAGNOSIS — Z79899 Other long term (current) drug therapy: Secondary | ICD-10-CM | POA: Diagnosis not present

## 2023-02-18 DIAGNOSIS — M79671 Pain in right foot: Secondary | ICD-10-CM | POA: Diagnosis not present

## 2023-02-18 DIAGNOSIS — M79606 Pain in leg, unspecified: Secondary | ICD-10-CM | POA: Diagnosis not present

## 2023-02-18 DIAGNOSIS — M549 Dorsalgia, unspecified: Secondary | ICD-10-CM | POA: Diagnosis not present

## 2023-02-18 DIAGNOSIS — M79672 Pain in left foot: Secondary | ICD-10-CM | POA: Diagnosis not present

## 2023-03-06 DIAGNOSIS — J449 Chronic obstructive pulmonary disease, unspecified: Secondary | ICD-10-CM | POA: Diagnosis not present

## 2023-03-16 DIAGNOSIS — G629 Polyneuropathy, unspecified: Secondary | ICD-10-CM | POA: Diagnosis not present

## 2023-03-16 DIAGNOSIS — M419 Scoliosis, unspecified: Secondary | ICD-10-CM | POA: Diagnosis not present

## 2023-03-16 DIAGNOSIS — S22080A Wedge compression fracture of T11-T12 vertebra, initial encounter for closed fracture: Secondary | ICD-10-CM | POA: Diagnosis not present

## 2023-03-16 DIAGNOSIS — Z79899 Other long term (current) drug therapy: Secondary | ICD-10-CM | POA: Diagnosis not present

## 2023-03-16 DIAGNOSIS — E1165 Type 2 diabetes mellitus with hyperglycemia: Secondary | ICD-10-CM | POA: Diagnosis not present

## 2023-03-16 DIAGNOSIS — M545 Low back pain, unspecified: Secondary | ICD-10-CM | POA: Diagnosis not present

## 2023-03-16 DIAGNOSIS — G894 Chronic pain syndrome: Secondary | ICD-10-CM | POA: Diagnosis not present

## 2023-03-16 DIAGNOSIS — E559 Vitamin D deficiency, unspecified: Secondary | ICD-10-CM | POA: Diagnosis not present

## 2023-03-16 DIAGNOSIS — Z9181 History of falling: Secondary | ICD-10-CM | POA: Diagnosis not present

## 2023-03-20 DIAGNOSIS — M549 Dorsalgia, unspecified: Secondary | ICD-10-CM | POA: Diagnosis not present

## 2023-03-20 DIAGNOSIS — M79606 Pain in leg, unspecified: Secondary | ICD-10-CM | POA: Diagnosis not present

## 2023-03-20 DIAGNOSIS — M79671 Pain in right foot: Secondary | ICD-10-CM | POA: Diagnosis not present

## 2023-03-20 DIAGNOSIS — M79672 Pain in left foot: Secondary | ICD-10-CM | POA: Diagnosis not present

## 2023-04-05 DIAGNOSIS — J449 Chronic obstructive pulmonary disease, unspecified: Secondary | ICD-10-CM | POA: Diagnosis not present

## 2023-04-12 ENCOUNTER — Other Ambulatory Visit: Payer: Self-pay | Admitting: Family Medicine

## 2023-04-12 DIAGNOSIS — H9201 Otalgia, right ear: Secondary | ICD-10-CM

## 2023-04-13 ENCOUNTER — Other Ambulatory Visit (HOSPITAL_COMMUNITY): Payer: Self-pay | Admitting: Psychiatry

## 2023-04-13 DIAGNOSIS — G894 Chronic pain syndrome: Secondary | ICD-10-CM | POA: Diagnosis not present

## 2023-04-13 DIAGNOSIS — Z9181 History of falling: Secondary | ICD-10-CM | POA: Diagnosis not present

## 2023-04-13 DIAGNOSIS — E559 Vitamin D deficiency, unspecified: Secondary | ICD-10-CM | POA: Diagnosis not present

## 2023-04-13 DIAGNOSIS — R45851 Suicidal ideations: Secondary | ICD-10-CM

## 2023-04-13 DIAGNOSIS — F4310 Post-traumatic stress disorder, unspecified: Secondary | ICD-10-CM

## 2023-04-13 DIAGNOSIS — F410 Panic disorder [episodic paroxysmal anxiety] without agoraphobia: Secondary | ICD-10-CM

## 2023-04-13 DIAGNOSIS — G629 Polyneuropathy, unspecified: Secondary | ICD-10-CM | POA: Diagnosis not present

## 2023-04-13 DIAGNOSIS — Z79899 Other long term (current) drug therapy: Secondary | ICD-10-CM | POA: Diagnosis not present

## 2023-04-13 DIAGNOSIS — G8929 Other chronic pain: Secondary | ICD-10-CM

## 2023-04-13 DIAGNOSIS — E1165 Type 2 diabetes mellitus with hyperglycemia: Secondary | ICD-10-CM | POA: Diagnosis not present

## 2023-04-13 DIAGNOSIS — M419 Scoliosis, unspecified: Secondary | ICD-10-CM | POA: Diagnosis not present

## 2023-04-13 DIAGNOSIS — S22080A Wedge compression fracture of T11-T12 vertebra, initial encounter for closed fracture: Secondary | ICD-10-CM | POA: Diagnosis not present

## 2023-04-13 DIAGNOSIS — F332 Major depressive disorder, recurrent severe without psychotic features: Secondary | ICD-10-CM

## 2023-04-13 DIAGNOSIS — M545 Low back pain, unspecified: Secondary | ICD-10-CM | POA: Diagnosis not present

## 2023-04-20 DIAGNOSIS — M549 Dorsalgia, unspecified: Secondary | ICD-10-CM | POA: Diagnosis not present

## 2023-04-20 DIAGNOSIS — M79671 Pain in right foot: Secondary | ICD-10-CM | POA: Diagnosis not present

## 2023-04-20 DIAGNOSIS — M79606 Pain in leg, unspecified: Secondary | ICD-10-CM | POA: Diagnosis not present

## 2023-04-20 DIAGNOSIS — M79672 Pain in left foot: Secondary | ICD-10-CM | POA: Diagnosis not present

## 2023-04-27 ENCOUNTER — Telehealth (HOSPITAL_COMMUNITY): Payer: Self-pay

## 2023-04-27 NOTE — Telephone Encounter (Signed)
Spoke with pt she states that she has not moved to [place] [employer]

## 2023-04-27 NOTE — Telephone Encounter (Signed)
Pt called in requesting a refill on her DULoxetine (CYMBALTA) 60 MG capsule called into [facility] pt is scheduled for 05/15/23. Please advise.

## 2023-05-06 DIAGNOSIS — J449 Chronic obstructive pulmonary disease, unspecified: Secondary | ICD-10-CM | POA: Diagnosis not present

## 2023-05-15 ENCOUNTER — Encounter (HOSPITAL_COMMUNITY): Payer: Self-pay | Admitting: Psychiatry

## 2023-05-15 ENCOUNTER — Ambulatory Visit: Payer: 59 | Admitting: Family Medicine

## 2023-05-15 ENCOUNTER — Telehealth: Payer: Self-pay | Admitting: Family Medicine

## 2023-05-15 ENCOUNTER — Telehealth (INDEPENDENT_AMBULATORY_CARE_PROVIDER_SITE_OTHER): Payer: Medicare HMO | Admitting: Psychiatry

## 2023-05-15 ENCOUNTER — Telehealth: Payer: Self-pay | Admitting: *Deleted

## 2023-05-15 DIAGNOSIS — F411 Generalized anxiety disorder: Secondary | ICD-10-CM

## 2023-05-15 DIAGNOSIS — T7411XA Adult physical abuse, confirmed, initial encounter: Secondary | ICD-10-CM | POA: Diagnosis not present

## 2023-05-15 DIAGNOSIS — F4310 Post-traumatic stress disorder, unspecified: Secondary | ICD-10-CM | POA: Diagnosis not present

## 2023-05-15 DIAGNOSIS — M545 Low back pain, unspecified: Secondary | ICD-10-CM

## 2023-05-15 DIAGNOSIS — E119 Type 2 diabetes mellitus without complications: Secondary | ICD-10-CM | POA: Diagnosis not present

## 2023-05-15 DIAGNOSIS — G8929 Other chronic pain: Secondary | ICD-10-CM

## 2023-05-15 DIAGNOSIS — Z79899 Other long term (current) drug therapy: Secondary | ICD-10-CM

## 2023-05-15 DIAGNOSIS — R45851 Suicidal ideations: Secondary | ICD-10-CM | POA: Diagnosis not present

## 2023-05-15 DIAGNOSIS — F332 Major depressive disorder, recurrent severe without psychotic features: Secondary | ICD-10-CM

## 2023-05-15 DIAGNOSIS — Z6841 Body Mass Index (BMI) 40.0 and over, adult: Secondary | ICD-10-CM | POA: Diagnosis not present

## 2023-05-15 DIAGNOSIS — I10 Essential (primary) hypertension: Secondary | ICD-10-CM | POA: Diagnosis not present

## 2023-05-15 DIAGNOSIS — Z79891 Long term (current) use of opiate analgesic: Secondary | ICD-10-CM

## 2023-05-15 DIAGNOSIS — F419 Anxiety disorder, unspecified: Secondary | ICD-10-CM | POA: Diagnosis not present

## 2023-05-15 DIAGNOSIS — F1210 Cannabis abuse, uncomplicated: Secondary | ICD-10-CM | POA: Diagnosis not present

## 2023-05-15 DIAGNOSIS — F17200 Nicotine dependence, unspecified, uncomplicated: Secondary | ICD-10-CM | POA: Diagnosis not present

## 2023-05-15 DIAGNOSIS — N3090 Cystitis, unspecified without hematuria: Secondary | ICD-10-CM | POA: Diagnosis not present

## 2023-05-15 DIAGNOSIS — Z6981 Encounter for mental health services for victim of other abuse: Secondary | ICD-10-CM | POA: Diagnosis not present

## 2023-05-15 DIAGNOSIS — F319 Bipolar disorder, unspecified: Secondary | ICD-10-CM | POA: Diagnosis not present

## 2023-05-15 DIAGNOSIS — E1143 Type 2 diabetes mellitus with diabetic autonomic (poly)neuropathy: Secondary | ICD-10-CM | POA: Diagnosis not present

## 2023-05-15 DIAGNOSIS — E7800 Pure hypercholesterolemia, unspecified: Secondary | ICD-10-CM | POA: Diagnosis not present

## 2023-05-15 DIAGNOSIS — F410 Panic disorder [episodic paroxysmal anxiety] without agoraphobia: Secondary | ICD-10-CM | POA: Diagnosis not present

## 2023-05-15 DIAGNOSIS — J449 Chronic obstructive pulmonary disease, unspecified: Secondary | ICD-10-CM | POA: Diagnosis not present

## 2023-05-15 MED ORDER — DULOXETINE HCL 60 MG PO CPEP: 120.0000 mg | ORAL_CAPSULE | Freq: Every day | ORAL | 2 refills | Status: DC

## 2023-05-15 MED ORDER — MIRTAZAPINE 15 MG PO TABS: 15.0000 mg | ORAL_TABLET | Freq: Every day | ORAL | 2 refills | Status: DC
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
## 2023-05-15 NOTE — Progress Notes (Addendum)
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 the patient. The patient was provided an opportunity to ask questions and all were answered. The patient agreed with the plan and demonstrated an understanding of the instructions.   The patient was advised to call back or seek an in-person evaluation if the symptoms worsen or if the condition fails to improve as anticipated.  I provided 50 minutes of virtual face-to-face time during this encounter including significant safety planning and contingency planning, calling  the Sheriff's Department for a wellness check and coordination of care.  Elsie Lincoln, MD 05/15/2023, 12:07 PM

## 2023-05-15 NOTE — Telephone Encounter (Signed)
I see no indication she went to ER.  Can we please call and check in on this patient again?

## 2023-05-15 NOTE — Telephone Encounter (Signed)
Pt called office crying, very upset, was hard to understand her. Pt states she just had visit with psych and was told to go to [facility] and pt called [name] stating she is scared of her husband and that when he gets home she is worried he may hurt her for not having groceries in the house and she is just really depressed and "doesn't want to be here anymore"  She does not have any weapons and doesn't have any plans of self harm right now. Advised pt to call 911 to have EMS transport her to ED for further evaluation since she has no transportation. Pt voiced understanding and states she will call once we hang up.

## 2023-05-15 NOTE — Telephone Encounter (Signed)
Closing call per Dr Reece Agar

## 2023-05-15 NOTE — Addendum Note (Signed)
Addended by: Tia Masker on: 05/15/2023 03:02 PM   Modules accepted: Level of Service

## 2023-05-15 NOTE — Telephone Encounter (Signed)
I have called 2 more times and no answer and lmtcb. Per Dr Reece Agar her psych has already called 911 to do a welfare check.

## 2023-05-28 DIAGNOSIS — E1140 Type 2 diabetes mellitus with diabetic neuropathy, unspecified: Secondary | ICD-10-CM | POA: Diagnosis not present

## 2023-05-28 DIAGNOSIS — M5417 Radiculopathy, lumbosacral region: Secondary | ICD-10-CM | POA: Diagnosis not present

## 2023-05-28 DIAGNOSIS — M545 Low back pain, unspecified: Secondary | ICD-10-CM | POA: Diagnosis not present

## 2023-05-28 DIAGNOSIS — M79602 Pain in left arm: Secondary | ICD-10-CM | POA: Diagnosis not present

## 2023-05-28 DIAGNOSIS — G894 Chronic pain syndrome: Secondary | ICD-10-CM | POA: Diagnosis not present

## 2023-05-28 DIAGNOSIS — Z79891 Long term (current) use of opiate analgesic: Secondary | ICD-10-CM | POA: Diagnosis not present

## 2023-06-04 ENCOUNTER — Telehealth: Payer: Self-pay | Admitting: Family Medicine

## 2023-06-13 ENCOUNTER — Ambulatory Visit: Payer: 59 | Admitting: Family Medicine

## 2023-06-15 ENCOUNTER — Telehealth: Payer: Self-pay | Admitting: Family Medicine

## 2023-06-18 NOTE — Telephone Encounter (Signed)
Left vm for cb

## 2023-06-18 NOTE — Telephone Encounter (Signed)
Pools please make sure to call and inquire about Dr Denyce Robert request below.

## 2023-06-18 NOTE — Telephone Encounter (Signed)
Again, I can NOT adjust medications that are being managed by a specialist.  She has a VERY complicated mental health history and this is BEYOND my area of expertise.  I am ccing her psychiatrist as Lorain Childes.

## 2023-06-25 DIAGNOSIS — M5417 Radiculopathy, lumbosacral region: Secondary | ICD-10-CM | POA: Diagnosis not present

## 2023-06-25 DIAGNOSIS — M79602 Pain in left arm: Secondary | ICD-10-CM | POA: Diagnosis not present

## 2023-06-25 DIAGNOSIS — E1140 Type 2 diabetes mellitus with diabetic neuropathy, unspecified: Secondary | ICD-10-CM | POA: Diagnosis not present

## 2023-06-25 DIAGNOSIS — M545 Low back pain, unspecified: Secondary | ICD-10-CM | POA: Diagnosis not present

## 2023-06-25 DIAGNOSIS — Z79891 Long term (current) use of opiate analgesic: Secondary | ICD-10-CM | POA: Diagnosis not present

## 2023-06-25 DIAGNOSIS — G894 Chronic pain syndrome: Secondary | ICD-10-CM | POA: Diagnosis not present

## 2023-06-26 ENCOUNTER — Encounter: Payer: Self-pay | Admitting: Family Medicine

## 2023-06-27 NOTE — Telephone Encounter (Signed)
Attempted to call pt - no answer closing call   If pt calls back please send to nurse

## 2023-06-28 ENCOUNTER — Telehealth: Payer: Self-pay | Admitting: Family Medicine

## 2023-06-28 NOTE — Telephone Encounter (Signed)
REFERRAL REQUEST Telephone Note  Have you been seen at our office for this problem? YES (Advise that they may need an appointment with their PCP before a referral can be done)  Reason for Referral: Medication for anxiety and depressions Referral discussed with patient: NO  Best contact number of patient for referral team: [phone]    Has patient been seen by a specialist for this issue before: YES Dr. Beryl Meager in [place]  Patient provider preference for referral: [facility] Patient location preference for referral: [facility]   Patient notified that referrals can take up to a week or longer to process. If they haven't heard anything within a week they should call back and speak with the referral department.

## 2023-06-28 NOTE — Telephone Encounter (Signed)
Will wait for PCP to address when she returns. Patient has not been seen by PCP in some time, so may require a visit first. Recommend she keep upcoming appt with Dr. Adrian Blackwater.

## 2023-07-02 ENCOUNTER — Encounter (HOSPITAL_COMMUNITY): Payer: Self-pay | Admitting: Psychiatry

## 2023-07-02 NOTE — Telephone Encounter (Signed)
We had a good talk.  She is looking into an inpatient program in [place] that accepts her insurance. Going to check if referral is needed and will let me know.

## 2023-07-03 ENCOUNTER — Telehealth (HOSPITAL_COMMUNITY): Payer: Medicare HMO | Admitting: Psychiatry

## 2023-07-09 ENCOUNTER — Other Ambulatory Visit: Payer: Self-pay | Admitting: Family Medicine

## 2023-07-09 DIAGNOSIS — H9201 Otalgia, right ear: Secondary | ICD-10-CM

## 2023-07-23 DIAGNOSIS — E1140 Type 2 diabetes mellitus with diabetic neuropathy, unspecified: Secondary | ICD-10-CM | POA: Diagnosis not present

## 2023-07-23 DIAGNOSIS — G894 Chronic pain syndrome: Secondary | ICD-10-CM | POA: Diagnosis not present

## 2023-07-23 DIAGNOSIS — Z79891 Long term (current) use of opiate analgesic: Secondary | ICD-10-CM | POA: Diagnosis not present

## 2023-07-23 DIAGNOSIS — M5417 Radiculopathy, lumbosacral region: Secondary | ICD-10-CM | POA: Diagnosis not present

## 2023-07-23 DIAGNOSIS — M545 Low back pain, unspecified: Secondary | ICD-10-CM | POA: Diagnosis not present

## 2023-07-23 DIAGNOSIS — M79602 Pain in left arm: Secondary | ICD-10-CM | POA: Diagnosis not present

## 2023-07-25 ENCOUNTER — Telehealth: Payer: Self-pay | Admitting: Family Medicine

## 2023-07-25 NOTE — Telephone Encounter (Signed)
Called and spoke with pt. She would not let me schedule her for 10/12/23 with PCP either. Said she wanted to just call back later.  Copied from CRM [id number]. Topic: Appointments - Appointment Scheduling >> Jul 25, 2023  1:48 PM Aimee Phillips wrote: Patient/patient representative is calling to schedule an appointment. Refer to attachments for appointment information. Patient wanted a appointment to see doctor to get a report to drive . A regulare check up but Dr Kathie Rhodes had no appointments till Jan. 31 . Patient didn't wont to schedule with no one else

## 2023-08-01 ENCOUNTER — Other Ambulatory Visit: Payer: Self-pay | Admitting: Family Medicine

## 2023-08-01 DIAGNOSIS — H9201 Otalgia, right ear: Secondary | ICD-10-CM

## 2023-08-02 ENCOUNTER — Telehealth: Payer: Self-pay

## 2023-08-02 NOTE — Patient Outreach (Signed)
Successful call to patient on today regarding preventative mammogram screening. Patient declined at this time and will follow up with PCP at later date.  Aimee Phillips [facility] Assistant [facility] Population [facility] [phone]

## 2023-08-20 ENCOUNTER — Telehealth: Payer: Self-pay | Admitting: Family Medicine

## 2023-08-20 DIAGNOSIS — M5417 Radiculopathy, lumbosacral region: Secondary | ICD-10-CM | POA: Diagnosis not present

## 2023-08-20 DIAGNOSIS — M79602 Pain in left arm: Secondary | ICD-10-CM | POA: Diagnosis not present

## 2023-08-20 DIAGNOSIS — G894 Chronic pain syndrome: Secondary | ICD-10-CM | POA: Diagnosis not present

## 2023-08-20 DIAGNOSIS — E1140 Type 2 diabetes mellitus with diabetic neuropathy, unspecified: Secondary | ICD-10-CM | POA: Diagnosis not present

## 2023-08-20 DIAGNOSIS — M545 Low back pain, unspecified: Secondary | ICD-10-CM | POA: Diagnosis not present

## 2023-09-03 ENCOUNTER — Other Ambulatory Visit: Payer: Self-pay | Admitting: Family Medicine

## 2023-09-03 DIAGNOSIS — H9201 Otalgia, right ear: Secondary | ICD-10-CM

## 2023-09-06 NOTE — Telephone Encounter (Signed)
[place] pt NTBS 30-d given 08/13/23

## 2023-09-17 ENCOUNTER — Ambulatory Visit (INDEPENDENT_AMBULATORY_CARE_PROVIDER_SITE_OTHER): Payer: Medicare HMO | Admitting: Family Medicine

## 2023-09-17 ENCOUNTER — Encounter: Payer: Self-pay | Admitting: Family Medicine

## 2023-09-17 VITALS — BP 115/79 | HR 102 | Temp 97.5°F | Ht 68.0 in | Wt 310.6 lb

## 2023-09-17 DIAGNOSIS — F411 Generalized anxiety disorder: Secondary | ICD-10-CM | POA: Diagnosis not present

## 2023-09-17 DIAGNOSIS — F332 Major depressive disorder, recurrent severe without psychotic features: Secondary | ICD-10-CM | POA: Diagnosis not present

## 2023-09-17 DIAGNOSIS — F4310 Post-traumatic stress disorder, unspecified: Secondary | ICD-10-CM | POA: Diagnosis not present

## 2023-09-17 DIAGNOSIS — F410 Panic disorder [episodic paroxysmal anxiety] without agoraphobia: Secondary | ICD-10-CM

## 2023-09-17 MED ORDER — QUETIAPINE FUMARATE 25 MG PO TABS
25.0000 mg | ORAL_TABLET | Freq: Every day | ORAL | 1 refills | Status: DC
Start: 2023-09-17 — End: 2024-02-29

## 2023-09-17 MED ORDER — MIRTAZAPINE 30 MG PO TABS
30.0000 mg | ORAL_TABLET | Freq: Every day | ORAL | 2 refills | Status: DC
Start: 2023-09-17 — End: 2024-02-06

## 2023-09-17 NOTE — Progress Notes (Signed)
 Subjective:  Patient ID: Aimee Phillips, female    DOB: [birth year]  Age: 53 y.o. MRN: [id number]  CC: Depression (On going since child hood GAD and PHQ9 done today patient is crying. Youngest son and husband with her today. States she has been [employer] to people online. Patient would also like Handicap form filled out today /)   HPI Aimee Phillips presents for depression. Cymbalta  and remeron  not working. Psych dropped her. Has appt. With a new psych next week. Off cymbalta . Continues  Remeron  for sleep due to insomnia and for pain relief.   Depression causing marital problems. Easily agitated. Flies off the handle easily.   Sendingmoney to people to come take her away fro environment described as toxic.   Toxic  due to middle son being mouthy. Drops off his 5y/o daughter for her to watch without asking. Pt. Feels she can't do this.  Tries to get her committed. [place] and now scared to be at her apartment where she lives with he husband and 3rd son.      09/17/2023    2:25 PM 01/09/2023    1:58 PM 10/24/2022   10:20 AM  Depression screen PHQ 2/9  Decreased Interest 3 3   Down, Depressed, Hopeless 3 3   PHQ - 2 Score 6 6   Altered sleeping 3 3   Tired, decreased energy 3 3   Change in appetite 3 1   Feeling bad or failure about yourself  3 3   Trouble concentrating 3 3   Moving slowly or fidgety/restless 0 0   Suicidal thoughts 0 0   PHQ-9 Score 21 19   Difficult doing work/chores Extremely dIfficult Very difficult      Information is confidential and restricted. Go to Review Flowsheets to unlock data.    History Aimee Phillips has a past medical history of Asthma, Bipolar affect, depressed (HCC), Cellulitis, COPD (chronic obstructive pulmonary disease) (HCC), Depression, Diabetes mellitus without complication (HCC), PE (pulmonary embolism), PONV (postoperative nausea and vomiting), and Pulmonary edema.   She has a past surgical history that includes Cesarean section;  Cholecystectomy; Esophagogastroduodenoscopy (egd) with propofol  (N/A, 10/29/2020); Tubal ligation; and ORIF humerus fracture (Left, 03/02/2022).   Her family history includes Cancer in her mother and sister; Diabetes in her brother; Heart disease in her sister; Heart failure in her father.She reports that she has never smoked. She has never been exposed to tobacco smoke. She has never used smokeless tobacco. She reports that she does not drink alcohol  and does not use drugs.    ROS Review of Systems  Constitutional: Negative.   HENT: Negative.    Eyes:  Negative for visual disturbance.  Respiratory:  Negative for shortness of breath.   Cardiovascular:  Negative for chest pain.  Gastrointestinal:  Negative for abdominal pain.  Musculoskeletal:  Negative for arthralgias.  Psychiatric/Behavioral:  Positive for agitation and dysphoric mood. The patient is nervous/anxious.     Objective:  BP 115/79   Pulse (!) 102   Temp (!) 97.5 F (36.4 C) (Temporal)   Ht 5' 8 (1.727 m)   Wt (!) 310 lb 9.6 oz (140.9 kg)   LMP  (LMP Unknown)   SpO2 98%   BMI 47.23 kg/m   BP Readings from Last 3 Encounters:  09/17/23 115/79  01/09/23 120/77  10/03/22 128/76    Wt Readings from Last 3 Encounters:  09/17/23 (!) 310 lb 9.6 oz (140.9 kg)  01/09/23 297 lb (134.7 kg)  10/03/22 ROLLEN)  308 lb (139.7 kg)     Physical Exam Constitutional:      General: She is not in acute distress.    Appearance: She is well-developed.  Cardiovascular:     Rate and Rhythm: Normal rate and regular rhythm.  Pulmonary:     Breath sounds: Normal breath sounds.  Musculoskeletal:        General: Normal range of motion.  Skin:    General: Skin is warm and dry.  Neurological:     Mental Status: She is alert and oriented to person, place, and time.  Psychiatric:        Mood and Affect: Mood is depressed. Affect is tearful.        Speech: Speech is rapid and pressured.        Cognition and Memory: Cognition and memory  normal.       Assessment & Plan:   Aimee Phillips was seen today for depression.  Diagnoses and all orders for this visit:  Severe episode of recurrent major depressive disorder, without psychotic features (HCC) -     mirtazapine  (REMERON ) 30 MG tablet; Take 1 tablet (30 mg total) by mouth at bedtime. For sleep, pain and depression -     QUEtiapine  (SEROQUEL ) 25 MG tablet; Take 1 tablet (25 mg total) by mouth at bedtime.  PTSD (post-traumatic stress disorder) -     mirtazapine  (REMERON ) 30 MG tablet; Take 1 tablet (30 mg total) by mouth at bedtime. For sleep, pain and depression -     QUEtiapine  (SEROQUEL ) 25 MG tablet; Take 1 tablet (25 mg total) by mouth at bedtime.  Generalized anxiety disorder with panic attacks -     mirtazapine  (REMERON ) 30 MG tablet; Take 1 tablet (30 mg total) by mouth at bedtime. For sleep, pain and depression -     QUEtiapine  (SEROQUEL ) 25 MG tablet; Take 1 tablet (25 mg total) by mouth at bedtime.       I have discontinued [name]'s DULoxetine . I have also changed her mirtazapine . Additionally, I am having her start on QUEtiapine . Lastly, I am having her maintain her cyclobenzaprine , Transport Chair, docusate sodium , dicyclomine , topiramate, gabapentin , insulin  aspart, oxyCODONE -acetaminophen , Easy Comfort Pen Needles, Lantus  SoloStar, furosemide , omeprazole , Vitamin D  (Ergocalciferol ), budesonide -formoterol , linaclotide , lisinopril , rosuvastatin , Semaglutide  (2 MG/DOSE), glucose blood, Lancet Devices, levocetirizine, and solifenacin .  Allergies as of 09/17/2023   No Known Allergies      Medication List        Accurate as of September 17, 2023  8:45 PM. If you have any questions, ask your nurse or doctor.          STOP taking these medications    DULoxetine  60 MG capsule Commonly known as: CYMBALTA  Stopped by: Maryann Mccall       TAKE these medications    budesonide -formoterol  160-4.5 MCG/ACT inhaler Commonly known as:  Symbicort  Inhale 2 puffs into the lungs 2 (two) times daily.   cyclobenzaprine  10 MG tablet Commonly known as: FLEXERIL  Take 1 tablet (10 mg total) by mouth 3 (three) times daily as needed for muscle spasms.   dicyclomine  10 MG capsule Commonly known as: BENTYL  TAKE ONE CAPSULE BY MOUTH EVERY 8 HOURS AS NEEDED SPASMS (FOR ABDOMINAL PAIN)   docusate sodium  100 MG capsule Commonly known as: COLACE Take 100 mg by mouth daily as needed (constipation.).   Easy Comfort Pen Needles 31G X 5 MM Misc Generic drug: Insulin  Pen Needle UAD for insulin  injection E11.65   furosemide  40 MG tablet Commonly known  as: LASIX  TAKE ONE TABLET BY MOUTH TWICE DAILY   gabapentin  300 MG capsule Commonly known as: NEURONTIN  Take 900 mg by mouth in the morning, at noon, in the evening, and at bedtime.   glucose blood test strip Commonly known as: Accu-Chek Aviva Use to check blood sugar qd e11.69   insulin  aspart 100 UNIT/ML injection Commonly known as: novoLOG  Inject 20 Units into the skin daily as needed for high blood sugar.   Lancet Devices Misc Test BGs QD, DX E11.69, Accucheck   Lantus  SoloStar 100 UNIT/ML Solostar Pen Generic drug: insulin  glargine Inject 40 Units into the skin 2 (two) times daily.   levocetirizine 5 MG tablet Commonly known as: XYZAL  TAKE 1 TABLET BY MOUTH EVERY EVENING FOR allergies   linaclotide  290 MCG Caps capsule Commonly known as: Linzess  Take 1 capsule (290 mcg total) by mouth daily before breakfast.   lisinopril  5 MG tablet Commonly known as: ZESTRIL  Take 1 tablet (5 mg total) by mouth daily.   mirtazapine  30 MG tablet Commonly known as: REMERON  Take 1 tablet (30 mg total) by mouth at bedtime. For sleep, pain and depression What changed:  medication strength how much to take additional instructions Changed by: Raechel Marcos   omeprazole  20 MG capsule Commonly known as: PRILOSEC TAKE ONE CAPSULE BY MOUTH ONCE DAILY   oxyCODONE -acetaminophen   10-325 MG tablet Commonly known as: PERCOCET Take 1 tablet by mouth every 4 (four) hours as needed for pain.   QUEtiapine  25 MG tablet Commonly known as: SEROQUEL  Take 1 tablet (25 mg total) by mouth at bedtime. Started by: Julie Paolini   rosuvastatin  20 MG tablet Commonly known as: CRESTOR  TAKE ONE TABLET BY MOUTH DAILY (BEDTIME)   Semaglutide  (2 MG/DOSE) 8 MG/3ML Sopn Inject 2 mg as directed once a week.   solifenacin  10 MG tablet Commonly known as: VESICARE  TAKE 1 TABLET BY MOUTH ONCE DAILY (needs TO be seen BEFORE NEXT refill)   topiramate 100 MG tablet Commonly known as: TOPAMAX Take 100 mg by mouth in the morning.   Transport Chair Misc Needs lift chair for >300lbs   Vitamin D  (Ergocalciferol ) 1.25 MG (50000 UNIT) Caps capsule Commonly known as: DRISDOL  Take 1 capsule (50,000 Units total) by mouth every 7 (seven) days. To REPLACE OTC Vit D         Follow-up: Return in about 2 weeks (around 10/01/2023) for Depression with PCP.  Butler Der, M.D.

## 2023-09-24 DIAGNOSIS — M5417 Radiculopathy, lumbosacral region: Secondary | ICD-10-CM | POA: Diagnosis not present

## 2023-09-24 DIAGNOSIS — G894 Chronic pain syndrome: Secondary | ICD-10-CM | POA: Diagnosis not present

## 2023-09-24 DIAGNOSIS — M79602 Pain in left arm: Secondary | ICD-10-CM | POA: Diagnosis not present

## 2023-09-24 DIAGNOSIS — M545 Low back pain, unspecified: Secondary | ICD-10-CM | POA: Diagnosis not present

## 2023-09-24 DIAGNOSIS — Z79891 Long term (current) use of opiate analgesic: Secondary | ICD-10-CM | POA: Diagnosis not present

## 2023-09-24 DIAGNOSIS — E1140 Type 2 diabetes mellitus with diabetic neuropathy, unspecified: Secondary | ICD-10-CM | POA: Diagnosis not present

## 2023-09-25 ENCOUNTER — Ambulatory Visit (INDEPENDENT_AMBULATORY_CARE_PROVIDER_SITE_OTHER): Payer: Medicare HMO | Admitting: Family Medicine

## 2023-09-25 ENCOUNTER — Encounter: Payer: Self-pay | Admitting: Family Medicine

## 2023-09-25 VITALS — BP 140/83 | HR 70 | Temp 97.6°F | Ht 68.0 in | Wt 314.0 lb

## 2023-09-25 DIAGNOSIS — R45851 Suicidal ideations: Secondary | ICD-10-CM | POA: Diagnosis not present

## 2023-09-25 DIAGNOSIS — F332 Major depressive disorder, recurrent severe without psychotic features: Secondary | ICD-10-CM | POA: Diagnosis not present

## 2023-09-25 NOTE — Patient Instructions (Signed)
 [address], [place], [place] [address] [phone]

## 2023-09-25 NOTE — Progress Notes (Signed)
 Subjective: CC: Major depressive disorder PCP: Aimee Aimee HERO, DO YEP:Aimee Phillips is a 53 y.o. female presenting to clinic today for:  1.  Severe depression Patient has been under the care of Dr. Barbra virtually for quite some time now.  He has on multiple occasions recommended that she seek inpatient evaluation for severe uncontrolled depression.  However, she has been very reluctant to do so for fear that she would be handcuffed to the bed and/or lose custody of her children.  However, she presents today alongside her husband and notes that her depression really has just gotten more more severe.  This has been despite multiple modalities of treatment in the outpatient setting including recent changing from Cymbalta  to Seroquel  by one of my colleagues here in office.  She has gone multiple days without adequate rest.  She reports suicidal thoughts without suicidal intention.  She is too chicken to kill herself.  She reports a very toxic home environment where her sons will often tell her that she should go ahead and just kill herself.  She is ready for inpatient evaluation to try and get better because she does not know if she will ever be happy again.   ROS: Per HPI  No Known Allergies Past Medical History:  Diagnosis Date   Asthma    Bipolar affect, depressed (HCC)    Cellulitis    COPD (chronic obstructive pulmonary disease) (HCC)    Depression    Diabetes mellitus without complication (HCC)    Type II   PE (pulmonary embolism)    PONV (postoperative nausea and vomiting)    Pulmonary edema     Current Outpatient Medications:    budesonide -formoterol  (SYMBICORT ) 160-4.5 MCG/ACT inhaler, Inhale 2 puffs into the lungs 2 (two) times daily., Disp: 10.2 g, Rfl: 11   cyclobenzaprine  (FLEXERIL ) 10 MG tablet, Take 1 tablet (10 mg total) by mouth 3 (three) times daily as needed for muscle spasms., Disp: 90 tablet, Rfl: 5   dicyclomine  (BENTYL ) 10 MG capsule, TAKE ONE  CAPSULE BY MOUTH EVERY 8 HOURS AS NEEDED SPASMS (FOR ABDOMINAL PAIN), Disp: 60 capsule, Rfl: 5   docusate sodium  (COLACE) 100 MG capsule, Take 100 mg by mouth daily as needed (constipation.)., Disp: , Rfl:    furosemide  (LASIX ) 40 MG tablet, TAKE ONE TABLET BY MOUTH TWICE DAILY, Disp: 60 tablet, Rfl: 1   gabapentin  (NEURONTIN ) 300 MG capsule, Take 900 mg by mouth in the morning, at noon, in the evening, and at bedtime., Disp: , Rfl:    glucose blood (ACCU-CHEK AVIVA) test strip, Use to check blood sugar qd e11.69, Disp: 100 each, Rfl: 3   insulin  aspart (NOVOLOG ) 100 UNIT/ML injection, Inject 20 Units into the skin daily as needed for high blood sugar., Disp: , Rfl:    insulin  glargine (LANTUS  SOLOSTAR) 100 UNIT/ML Solostar Pen, Inject 40 Units into the skin 2 (two) times daily., Disp: 30 mL, Rfl: 0   Insulin  Pen Needle (EASY COMFORT PEN NEEDLES) 31G X 5 MM MISC, UAD for insulin  injection E11.65, Disp: 100 each, Rfl: 0   Lancet Devices MISC, Test BGs QD, DX E11.69, Accucheck, Disp: 100 each, Rfl: prn   levocetirizine (XYZAL ) 5 MG tablet, TAKE 1 TABLET BY MOUTH EVERY EVENING FOR allergies, Disp: 30 tablet, Rfl: 0   linaclotide  (LINZESS ) 290 MCG CAPS capsule, Take 1 capsule (290 mcg total) by mouth daily before breakfast., Disp: 30 capsule, Rfl: 12   lisinopril  (ZESTRIL ) 5 MG tablet, Take 1 tablet (5 mg  total) by mouth daily., Disp: 90 tablet, Rfl: 3   mirtazapine  (REMERON ) 30 MG tablet, Take 1 tablet (30 mg total) by mouth at bedtime. For sleep, pain and depression, Disp: 30 tablet, Rfl: 2   Misc. Devices (TRANSPORT CHAIR) MISC, Needs lift chair for >300lbs, Disp: 1 each, Rfl: 0   omeprazole  (PRILOSEC) 20 MG capsule, TAKE ONE CAPSULE BY MOUTH ONCE DAILY, Disp: 90 capsule, Rfl: 0   oxyCODONE -acetaminophen  (PERCOCET) 10-325 MG tablet, Take 1 tablet by mouth every 4 (four) hours as needed for pain., Disp: , Rfl:    QUEtiapine  (SEROQUEL ) 25 MG tablet, Take 1 tablet (25 mg total) by mouth at bedtime.,  Disp: 90 tablet, Rfl: 1   rosuvastatin  (CRESTOR ) 20 MG tablet, TAKE ONE TABLET BY MOUTH DAILY (BEDTIME), Disp: 90 tablet, Rfl: 3   Semaglutide , 2 MG/DOSE, 8 MG/3ML SOPN, Inject 2 mg as directed once a week., Disp: 9 mL, Rfl: 3   solifenacin  (VESICARE ) 10 MG tablet, TAKE 1 TABLET BY MOUTH ONCE DAILY (needs TO be seen BEFORE NEXT refill), Disp: 30 tablet, Rfl: 0   topiramate (TOPAMAX) 100 MG tablet, Take 100 mg by mouth in the morning., Disp: , Rfl:    Vitamin D , Ergocalciferol , (DRISDOL ) 1.25 MG (50000 UNIT) CAPS capsule, Take 1 capsule (50,000 Units total) by mouth every 7 (seven) days. To REPLACE OTC Vit D, Disp: 12 capsule, Rfl: 3 Social History   Socioeconomic History   Marital status: Married    Spouse name: Aimee Phillips   Number of children: 3   Years of education: 12   Highest education level: High school graduate  Occupational History   Occupation: Disabled  Tobacco Use   Smoking status: Never    Passive exposure: Never   Smokeless tobacco: Never  Vaping Use   Vaping status: Never Used  Substance and Sexual Activity   Alcohol  use: No   Drug use: No   Sexual activity: Not Currently    Partners: Male  Other Topics Concern   Not on file  Social History Narrative   Not on file   Social Drivers of Health   Financial Resource Strain: Low Risk  (08/09/2022)   Overall Financial Resource Strain (CARDIA)    Difficulty of Paying Living Expenses: Not hard at all  Food Insecurity: Food Insecurity Present (08/09/2022)   Hunger Vital Sign    Worried About Running Out of Food in the Last Year: Sometimes true    Ran Out of Food in the Last Year: Never true  Transportation Needs: Unmet Transportation Needs (08/09/2022)   PRAPARE - Administrator, Civil Service (Medical): Yes    Lack of Transportation (Non-Medical): No  Physical Activity: Inactive (08/09/2022)   Exercise Vital Sign    Days of Exercise per Week: 0 days    Minutes of Exercise per Session: 0 min  Stress:  No Stress Concern Present (08/09/2022)   [employer] of Occupational Health - Occupational Stress Questionnaire    Feeling of Stress : Not at all  Social Connections: Socially Integrated (08/09/2022)   Social Connection and Isolation Panel [NHANES]    Frequency of Communication with Friends and Family: Three times a week    Frequency of Social Gatherings with Friends and Family: Three times a week    Attends Religious Services: More than 4 times per year    Active Member of Clubs or Organizations: Yes    Attends Banker Meetings: 1 to 4 times per year    Marital Status:  Married  Intimate Partner Violence: Not At Risk (08/09/2022)   Humiliation, Afraid, Rape, and Kick questionnaire    Fear of Current or Ex-Partner: No    Emotionally Abused: No    Physically Abused: No    Sexually Abused: No   Family History  Problem Relation Age of Onset   Cancer Mother        breast   Heart failure Father        died of AMI   Cancer Sister    Diabetes Brother    Heart disease Sister     Objective: Office vital signs reviewed. BP (!) 140/83   Pulse 70   Temp 97.6 F (36.4 C)   Ht 5' 8 (1.727 m)   Wt (!) 314 lb (142.4 kg)   LMP  (LMP Unknown)   SpO2 99%   BMI 47.74 kg/m   Physical Examination:  General: Awake, alert, morbidly obese, No acute distress Psych: Tearful, thought process is linear.  Does not appear to be responding to internal stimuli     09/25/2023    2:32 PM 09/17/2023    2:25 PM 01/09/2023    1:58 PM 10/24/2022   10:20 AM 10/03/2022    3:05 PM  Depression screen PHQ 2/9  Decreased Interest 3 3 3 3 2   Down, Depressed, Hopeless 3 3 3 3 2   PHQ - 2 Score 6 6 6 6 4   Altered sleeping 3 3 3 3 2   Tired, decreased energy 3 3 3 3 2   Change in appetite 3 3 1  0 1  Feeling bad or failure about yourself  3 3 3 3 3   Trouble concentrating 3 3 3  0 0  Moving slowly or fidgety/restless 1 0 0 0 0  Suicidal thoughts 1 0 0 2 1  PHQ-9 Score 23 21 19 17 13   Difficult  doing work/chores Very difficult Extremely dIfficult Very difficult Very difficult       09/25/2023    2:31 PM 09/17/2023    2:25 PM 01/09/2023    1:57 PM 10/24/2022   10:22 AM  GAD 7 : Generalized Anxiety Score  Nervous, Anxious, on Edge 3 3 3 3   Control/stop worrying 3 3 3 3   Worry too much - different things 3 3 3 3   Trouble relaxing 3 3 3 3   Restless 3 3 3 3   Easily annoyed or irritable 3 3 3 3   Afraid - awful might happen 3 3 3  0  Total GAD 7 Score 21 21 21 18   Anxiety Difficulty  Somewhat difficult Somewhat difficult    \Assessment/ Plan: 53 y.o. female   Severe episode of recurrent major depressive disorder, without psychotic features (HCC)  Suicidal ideation  I have recommended inpatient evaluation for psychiatric treatment.  This has been previously recommended by Dr. Barbra on multiple occasions and patient now in the action phase of seeking treatment.  She is going to go home and pack a bag and will go directly down to the behavioral health center in [place] for admission.  My nurse has already reached out to them and left a voicemail with patient's information and impending arrival.  I will also CC Dr. Barbra as FYI as he may be able to facilitate admission.   Aimee CHRISTELLA Fielding, DO [facility] [phone]

## 2023-10-02 ENCOUNTER — Other Ambulatory Visit: Payer: Self-pay | Admitting: Family Medicine

## 2023-10-02 DIAGNOSIS — H9201 Otalgia, right ear: Secondary | ICD-10-CM

## 2023-10-08 ENCOUNTER — Encounter: Payer: Self-pay | Admitting: Family Medicine

## 2023-10-08 ENCOUNTER — Telehealth: Payer: Self-pay | Admitting: Family Medicine

## 2023-10-08 DIAGNOSIS — E559 Vitamin D deficiency, unspecified: Secondary | ICD-10-CM | POA: Diagnosis not present

## 2023-10-08 DIAGNOSIS — G894 Chronic pain syndrome: Secondary | ICD-10-CM | POA: Diagnosis not present

## 2023-10-08 DIAGNOSIS — M419 Scoliosis, unspecified: Secondary | ICD-10-CM | POA: Diagnosis not present

## 2023-10-08 DIAGNOSIS — R0602 Shortness of breath: Secondary | ICD-10-CM | POA: Diagnosis not present

## 2023-10-08 DIAGNOSIS — E039 Hypothyroidism, unspecified: Secondary | ICD-10-CM | POA: Diagnosis not present

## 2023-10-08 DIAGNOSIS — M5410 Radiculopathy, site unspecified: Secondary | ICD-10-CM | POA: Diagnosis not present

## 2023-10-08 DIAGNOSIS — M129 Arthropathy, unspecified: Secondary | ICD-10-CM | POA: Diagnosis not present

## 2023-10-08 DIAGNOSIS — Z6841 Body Mass Index (BMI) 40.0 and over, adult: Secondary | ICD-10-CM | POA: Diagnosis not present

## 2023-10-08 DIAGNOSIS — E1165 Type 2 diabetes mellitus with hyperglycemia: Secondary | ICD-10-CM | POA: Diagnosis not present

## 2023-10-08 DIAGNOSIS — Z79899 Other long term (current) drug therapy: Secondary | ICD-10-CM | POA: Diagnosis not present

## 2023-10-08 DIAGNOSIS — E7800 Pure hypercholesterolemia, unspecified: Secondary | ICD-10-CM | POA: Diagnosis not present

## 2023-10-08 NOTE — Telephone Encounter (Signed)
I contacted [facility] after speaking to her psychiatrist Dr Adrian Blackwater.  Given her suicidal ideation and high risk situation at home he recommended that I contact the sheriff department for a wellness check.  He again did recommend that she go to the [facility] behavioral health ER to be admitted.  If she is actively suicidal of course we would need to consider involuntary admission.  However, at last visit she seemed amenable to going but reported some barriers to being admitted.  I am not sure if she was showing up at the wrong address or what exactly was going on

## 2023-10-10 DIAGNOSIS — Z79899 Other long term (current) drug therapy: Secondary | ICD-10-CM | POA: Diagnosis not present

## 2023-10-25 ENCOUNTER — Other Ambulatory Visit: Payer: Self-pay | Admitting: Family Medicine

## 2023-10-26 DIAGNOSIS — Z79899 Other long term (current) drug therapy: Secondary | ICD-10-CM | POA: Diagnosis not present

## 2023-10-26 DIAGNOSIS — M25512 Pain in left shoulder: Secondary | ICD-10-CM | POA: Diagnosis not present

## 2023-10-26 DIAGNOSIS — E1165 Type 2 diabetes mellitus with hyperglycemia: Secondary | ICD-10-CM | POA: Diagnosis not present

## 2023-10-26 DIAGNOSIS — S22080A Wedge compression fracture of T11-T12 vertebra, initial encounter for closed fracture: Secondary | ICD-10-CM | POA: Diagnosis not present

## 2023-10-26 DIAGNOSIS — M5410 Radiculopathy, site unspecified: Secondary | ICD-10-CM | POA: Diagnosis not present

## 2023-10-26 DIAGNOSIS — G894 Chronic pain syndrome: Secondary | ICD-10-CM | POA: Diagnosis not present

## 2023-10-26 DIAGNOSIS — Z6841 Body Mass Index (BMI) 40.0 and over, adult: Secondary | ICD-10-CM | POA: Diagnosis not present

## 2023-10-26 DIAGNOSIS — M419 Scoliosis, unspecified: Secondary | ICD-10-CM | POA: Diagnosis not present

## 2023-10-30 DIAGNOSIS — Z79899 Other long term (current) drug therapy: Secondary | ICD-10-CM | POA: Diagnosis not present

## 2023-11-14 DIAGNOSIS — R1013 Epigastric pain: Secondary | ICD-10-CM | POA: Diagnosis not present

## 2023-11-14 DIAGNOSIS — Z6379 Other stressful life events affecting family and household: Secondary | ICD-10-CM | POA: Diagnosis not present

## 2023-11-14 DIAGNOSIS — T7421XA Adult sexual abuse, confirmed, initial encounter: Secondary | ICD-10-CM | POA: Diagnosis not present

## 2023-11-26 DIAGNOSIS — M419 Scoliosis, unspecified: Secondary | ICD-10-CM | POA: Diagnosis not present

## 2023-11-26 DIAGNOSIS — Z6841 Body Mass Index (BMI) 40.0 and over, adult: Secondary | ICD-10-CM | POA: Diagnosis not present

## 2023-11-26 DIAGNOSIS — Z79899 Other long term (current) drug therapy: Secondary | ICD-10-CM | POA: Diagnosis not present

## 2023-11-26 DIAGNOSIS — M25512 Pain in left shoulder: Secondary | ICD-10-CM | POA: Diagnosis not present

## 2023-11-26 DIAGNOSIS — M5410 Radiculopathy, site unspecified: Secondary | ICD-10-CM | POA: Diagnosis not present

## 2023-11-26 DIAGNOSIS — G894 Chronic pain syndrome: Secondary | ICD-10-CM | POA: Diagnosis not present

## 2023-11-26 DIAGNOSIS — Z0000 Encounter for general adult medical examination without abnormal findings: Secondary | ICD-10-CM | POA: Diagnosis not present

## 2023-11-26 DIAGNOSIS — E1165 Type 2 diabetes mellitus with hyperglycemia: Secondary | ICD-10-CM | POA: Diagnosis not present

## 2023-11-30 DIAGNOSIS — F332 Major depressive disorder, recurrent severe without psychotic features: Secondary | ICD-10-CM | POA: Diagnosis not present

## 2023-11-30 DIAGNOSIS — F419 Anxiety disorder, unspecified: Secondary | ICD-10-CM | POA: Diagnosis not present

## 2023-11-30 DIAGNOSIS — Z79899 Other long term (current) drug therapy: Secondary | ICD-10-CM | POA: Diagnosis not present

## 2023-12-13 ENCOUNTER — Other Ambulatory Visit: Payer: Self-pay | Admitting: *Deleted

## 2023-12-13 DIAGNOSIS — E1169 Type 2 diabetes mellitus with other specified complication: Secondary | ICD-10-CM

## 2023-12-13 DIAGNOSIS — I152 Hypertension secondary to endocrine disorders: Secondary | ICD-10-CM

## 2023-12-13 MED ORDER — LISINOPRIL 5 MG PO TABS: 5.0000 mg | ORAL_TABLET | Freq: Every day | ORAL | 0 refills | Status: DC

## 2023-12-13 MED ORDER — ROSUVASTATIN CALCIUM 20 MG PO TABS: ORAL_TABLET | ORAL | 0 refills | Status: DC

## 2023-12-13 NOTE — Telephone Encounter (Signed)
 90-d request Nadine Counts NTBS last OV 01/09/23 for chronic FU NO RF sent to pharmacy

## 2023-12-13 NOTE — Addendum Note (Signed)
 Addended by: Julious Payer D on: 12/13/2023 02:43 PM   Modules accepted: Orders

## 2023-12-13 NOTE — Telephone Encounter (Signed)
 SCHEDULE APPT FOR MAY 15

## 2023-12-21 ENCOUNTER — Ambulatory Visit: Payer: Medicare HMO

## 2023-12-27 DIAGNOSIS — E1165 Type 2 diabetes mellitus with hyperglycemia: Secondary | ICD-10-CM | POA: Diagnosis not present

## 2023-12-27 DIAGNOSIS — Z79899 Other long term (current) drug therapy: Secondary | ICD-10-CM | POA: Diagnosis not present

## 2023-12-27 DIAGNOSIS — G894 Chronic pain syndrome: Secondary | ICD-10-CM | POA: Diagnosis not present

## 2023-12-27 DIAGNOSIS — Z6841 Body Mass Index (BMI) 40.0 and over, adult: Secondary | ICD-10-CM | POA: Diagnosis not present

## 2023-12-27 DIAGNOSIS — M25512 Pain in left shoulder: Secondary | ICD-10-CM | POA: Diagnosis not present

## 2023-12-27 DIAGNOSIS — M5410 Radiculopathy, site unspecified: Secondary | ICD-10-CM | POA: Diagnosis not present

## 2023-12-27 DIAGNOSIS — M419 Scoliosis, unspecified: Secondary | ICD-10-CM | POA: Diagnosis not present

## 2023-12-27 DIAGNOSIS — S22080A Wedge compression fracture of T11-T12 vertebra, initial encounter for closed fracture: Secondary | ICD-10-CM | POA: Diagnosis not present

## 2023-12-31 ENCOUNTER — Encounter

## 2023-12-31 NOTE — Progress Notes (Signed)
 This encounter was created in error - please disregard.

## 2024-01-01 DIAGNOSIS — Z79899 Other long term (current) drug therapy: Secondary | ICD-10-CM | POA: Diagnosis not present

## 2024-01-03 ENCOUNTER — Ambulatory Visit

## 2024-01-25 DIAGNOSIS — M419 Scoliosis, unspecified: Secondary | ICD-10-CM | POA: Diagnosis not present

## 2024-01-25 DIAGNOSIS — Z6841 Body Mass Index (BMI) 40.0 and over, adult: Secondary | ICD-10-CM | POA: Diagnosis not present

## 2024-01-25 DIAGNOSIS — E1165 Type 2 diabetes mellitus with hyperglycemia: Secondary | ICD-10-CM | POA: Diagnosis not present

## 2024-01-25 DIAGNOSIS — G894 Chronic pain syndrome: Secondary | ICD-10-CM | POA: Diagnosis not present

## 2024-01-25 DIAGNOSIS — Z79899 Other long term (current) drug therapy: Secondary | ICD-10-CM | POA: Diagnosis not present

## 2024-01-25 DIAGNOSIS — M5410 Radiculopathy, site unspecified: Secondary | ICD-10-CM | POA: Diagnosis not present

## 2024-01-25 DIAGNOSIS — S22080A Wedge compression fracture of T11-T12 vertebra, initial encounter for closed fracture: Secondary | ICD-10-CM | POA: Diagnosis not present

## 2024-01-25 DIAGNOSIS — M25512 Pain in left shoulder: Secondary | ICD-10-CM | POA: Diagnosis not present

## 2024-01-27 ENCOUNTER — Other Ambulatory Visit: Payer: Self-pay | Admitting: Family Medicine

## 2024-01-27 DIAGNOSIS — K5909 Other constipation: Secondary | ICD-10-CM

## 2024-01-30 ENCOUNTER — Ambulatory Visit: Admitting: Family Medicine

## 2024-02-05 ENCOUNTER — Ambulatory Visit: Payer: Self-pay | Admitting: Family Medicine

## 2024-02-05 NOTE — Telephone Encounter (Signed)
 This RN made the second attempt to contact the pt. Pt did not answer. Pt's VMB is full, RN unable to leave a message.

## 2024-02-05 NOTE — Telephone Encounter (Signed)
 Pt states this has been here for one month , states she thinks it is more like a knot that has been there since she fell - she is on the dod schedule for tomorrow  just making you aware

## 2024-02-05 NOTE — Telephone Encounter (Signed)
 RN made a third attempt to contact the pt. Pt did not answer. VMB full. RN will route to practice. Reason for Disposition . Third attempt to contact caller AND no contact made. Phone number verified.  Protocols used: No Contact or Duplicate Contact Call-A-AH

## 2024-02-05 NOTE — Telephone Encounter (Signed)
 The call with patient dropped upon warm transfer. This RN made first attempt to contact patient with voicemail stating it is full.    Copied from CRM [id number]. Topic: Clinical - Red Word Triage >> Feb 05, 2024  8:33 AM Danelle Dunning F wrote: Red Word that prompted transfer to Nurse Triage:   Raised bump on head; headaches; extreme fatigue

## 2024-02-06 ENCOUNTER — Ambulatory Visit: Admitting: Family Medicine

## 2024-02-06 ENCOUNTER — Encounter: Payer: Self-pay | Admitting: Family Medicine

## 2024-02-06 VITALS — BP 106/74 | HR 100 | Temp 96.8°F | Ht 68.0 in | Wt 316.6 lb

## 2024-02-06 DIAGNOSIS — F410 Panic disorder [episodic paroxysmal anxiety] without agoraphobia: Secondary | ICD-10-CM

## 2024-02-06 DIAGNOSIS — R5383 Other fatigue: Secondary | ICD-10-CM | POA: Diagnosis not present

## 2024-02-06 DIAGNOSIS — R7309 Other abnormal glucose: Secondary | ICD-10-CM | POA: Diagnosis not present

## 2024-02-06 DIAGNOSIS — F332 Major depressive disorder, recurrent severe without psychotic features: Secondary | ICD-10-CM | POA: Diagnosis not present

## 2024-02-06 DIAGNOSIS — E559 Vitamin D deficiency, unspecified: Secondary | ICD-10-CM

## 2024-02-06 DIAGNOSIS — F411 Generalized anxiety disorder: Secondary | ICD-10-CM | POA: Diagnosis not present

## 2024-02-06 DIAGNOSIS — F4310 Post-traumatic stress disorder, unspecified: Secondary | ICD-10-CM

## 2024-02-06 NOTE — Progress Notes (Signed)
 Subjective:  Patient ID: Aimee Phillips, female    DOB: 21-Mar-1971, 53 y.o.   MRN: [id number]  Patient Care Team: Eliodoro Guerin, DO as PCP - General (Family Medicine) Delilah Fend, [facility] as Pharmacist (Family Medicine) Ulanda Gambles, NP (Inactive) as [facility] Care Management Cathyann Cobia, Jola Nash, MD as Consulting Physician (Psychiatry)   Chief Complaint:  Depression (Last few weeks haven't felt well, take meds daily still not feeling good. Little interest in doing things, always in pain. Doesn't feel like the medication is working, and feels like it is ruining her marriage. Doesn't have anyone to talk to so keeps feelings to herself. Has knot on head and is having headaches.)   HPI: Aimee Phillips is a 53 y.o. female presenting on 02/06/2024 for Depression (Last few weeks haven't felt well, take meds daily still not feeling good. Little interest in doing things, always in pain. Doesn't feel like the medication is working, and feels like it is ruining her marriage. Doesn't have anyone to talk to so keeps feelings to herself. Has knot on head and is having headaches.)  Depression        Presents today with husband. Patient reports that she has been feeling drained and tired. Feels that it has worsened in the last 2 weeks. Reports that she is nauseous. Denies vomiting. She has chronic cough from COPD and feels that it is worse. Denies rhinorrhea. Reports that she had a sore throat for a few days and now it is better. She reports increased headaches.  Wonders if symptoms are coming from her depression and chronic pain. She was previously recommended to go to inpatient treatment. Reports that she "pitched a fit" and refused to be seen. She states that she did not follow up. She does not currently see Dr. Cathyann Cobia. States that he dropped her as a patient. States that she asked about spravato, but she does not have transportation to [facility] for treatment. States that her marriage  is on the rocks and feels that she cannot work on it due to her depression. Reports that middle child uses her as a babysitter, so she does not feel she is able to complete inpatient treatment or visit her sister in [place] because she feels obligated to babysit. Has decreased pleasure with her grandchildren. She states that she is trying to help herself and her family. Reports that she tried one of her sister's klonopin and it really helped her in the past. She is out of remeron . She denies active plans of self harm. She has a history of vitamin D  deficiency and has restarted OTC supplement this week.   Relevant past medical, surgical, family, and social history reviewed and updated as indicated.  Allergies and medications reviewed and updated. Data reviewed: Chart in Epic.   Past Medical History:  Diagnosis Date   Asthma    Bipolar affect, depressed (HCC)    Cellulitis    COPD (chronic obstructive pulmonary disease) (HCC)    Depression    Diabetes mellitus without complication (HCC)    Type II   PE (pulmonary embolism)    PONV (postoperative nausea and vomiting)    Pulmonary edema     Past Surgical History:  Procedure Laterality Date   CESAREAN SECTION     CHOLECYSTECTOMY     ESOPHAGOGASTRODUODENOSCOPY (EGD) WITH PROPOFOL  N/A 10/29/2020   Procedure: ESOPHAGOGASTRODUODENOSCOPY (EGD) WITH PROPOFOL ;  Surgeon: Urban Garden, MD;  Location: [facility];  Service: Gastroenterology;  Laterality: N/A;  12:30   ORIF HUMERUS FRACTURE Left 03/02/2022   Procedure: OPEN REDUCTION INTERNAL FIXATION (ORIF) PROXIMAL HUMERUS FRACTURE;  Surgeon: Laneta Pintos, MD;  Location: [facility] OR;  Service: Orthopedics;  Laterality: Left;   TUBAL LIGATION      Social History   Socioeconomic History   Marital status: Married    Spouse name: Jalen Oberry   Number of children: 3   Years of education: 12   Highest education level: High school graduate  Occupational History   Occupation:  Disabled  Tobacco Use   Smoking status: Never    Passive exposure: Never   Smokeless tobacco: Never  Vaping Use   Vaping status: Never Used  Substance and Sexual Activity   Alcohol  use: No   Drug use: No   Sexual activity: Not Currently    Partners: Male  Other Topics Concern   Not on file  Social History Narrative   Not on file   Social Drivers of Health   Financial Resource Strain: Low Risk  (08/09/2022)   Overall Financial Resource Strain (CARDIA)    Difficulty of Paying Living Expenses: Not hard at all  Food Insecurity: Food Insecurity Present (08/09/2022)   Hunger Vital Sign    Worried About Running Out of Food in the Last Year: Sometimes true    Ran Out of Food in the Last Year: Never true  Transportation Needs: Unmet Transportation Needs (08/09/2022)   PRAPARE - Administrator, Civil Service (Medical): Yes    Lack of Transportation (Non-Medical): No  Physical Activity: Inactive (08/09/2022)   Exercise Vital Sign    Days of Exercise per Week: 0 days    Minutes of Exercise per Session: 0 min  Stress: No Stress Concern Present (08/09/2022)   [employer] of Occupational Health - Occupational Stress Questionnaire    Feeling of Stress : Not at all  Social Connections: Socially Integrated (08/09/2022)   Social Connection and Isolation Panel [NHANES]    Frequency of Communication with Friends and Family: Three times a week    Frequency of Social Gatherings with Friends and Family: Three times a week    Attends Religious Services: More than 4 times per year    Active Member of Clubs or Organizations: Yes    Attends Banker Meetings: 1 to 4 times per year    Marital Status: Married  Catering manager Violence: Not At Risk (08/09/2022)   Humiliation, Afraid, Rape, and Kick questionnaire    Fear of Current or Ex-Partner: No    Emotionally Abused: No    Physically Abused: No    Sexually Abused: No    Outpatient Encounter Medications as of  02/06/2024  Medication Sig   budesonide -formoterol  (SYMBICORT ) 160-4.5 MCG/ACT inhaler Inhale 2 puffs into the lungs 2 (two) times daily.   cyclobenzaprine  (FLEXERIL ) 10 MG tablet Take 1 tablet (10 mg total) by mouth 3 (three) times daily as needed for muscle spasms.   dicyclomine  (BENTYL ) 10 MG capsule TAKE ONE CAPSULE BY MOUTH EVERY 8 HOURS AS NEEDED SPASMS (FOR ABDOMINAL PAIN)   docusate sodium  (COLACE) 100 MG capsule Take 100 mg by mouth daily as needed (constipation.).   furosemide  (LASIX ) 40 MG tablet TAKE ONE TABLET BY MOUTH TWICE DAILY   gabapentin  (NEURONTIN ) 300 MG capsule Take 900 mg by mouth in the morning, at noon, in the evening, and at bedtime.   glucose blood (ACCU-CHEK AVIVA) test strip Use to check blood sugar qd e11.69   insulin   aspart (NOVOLOG ) 100 UNIT/ML injection Inject 20 Units into the skin daily as needed for high blood sugar.   insulin  glargine (LANTUS  SOLOSTAR) 100 UNIT/ML Solostar Pen Inject 40 Units into the skin 2 (two) times daily.   Insulin  Pen Needle (EASY COMFORT PEN NEEDLES) 31G X 5 MM MISC UAD for insulin  injection E11.65   Lancet Devices MISC Test BGs QD, DX E11.69, Accucheck   levocetirizine (XYZAL ) 5 MG tablet TAKE 1 TABLET BY MOUTH EVERY EVENING FOR allergies   LINZESS  290 MCG CAPS capsule TAKE ONE CAPSULE BY MOUTH BEFORE breakfast   lisinopril  (ZESTRIL ) 5 MG tablet Take 1 tablet (5 mg total) by mouth daily.   mirtazapine  (REMERON ) 30 MG tablet Take 1 tablet (30 mg total) by mouth at bedtime. For sleep, pain and depression   Misc. Devices (TRANSPORT CHAIR) MISC Needs lift chair for >300lbs   omeprazole  (PRILOSEC) 20 MG capsule TAKE ONE CAPSULE BY MOUTH ONCE DAILY   oxyCODONE -acetaminophen  (PERCOCET) 10-325 MG tablet Take 1 tablet by mouth every 4 (four) hours as needed for pain.   pregabalin (LYRICA) 50 MG capsule Take 50 mg by mouth 3 (three) times daily.   QUEtiapine  (SEROQUEL ) 25 MG tablet Take 1 tablet (25 mg total) by mouth at bedtime.   rosuvastatin   (CRESTOR ) 20 MG tablet TAKE ONE TABLET BY MOUTH DAILY (BEDTIME)   Semaglutide , 2 MG/DOSE, 8 MG/3ML SOPN Inject 2 mg as directed once a week.   solifenacin  (VESICARE ) 10 MG tablet Take 1 tablet (10 mg total) by mouth daily.   topiramate (TOPAMAX) 100 MG tablet Take 100 mg by mouth in the morning.   Vitamin D , Ergocalciferol , (DRISDOL ) 1.25 MG (50000 UNIT) CAPS capsule Take 1 capsule (50,000 Units total) by mouth every 7 (seven) days. To REPLACE OTC Vit D   No facility-administered encounter medications on file as of 02/06/2024.    No Known Allergies  Review of Systems  Psychiatric/Behavioral:  Positive for depression.    As per HPI  Objective:  BP 106/74   Pulse 100   Temp (!) 96.8 F (36 C)   Ht 5\' 8"  (1.727 m)   Wt (!) 316 lb 9.6 oz (143.6 kg)   LMP  (LMP Unknown)   SpO2 95%   BMI 48.14 kg/m    Wt Readings from Last 3 Encounters:  02/06/24 (!) 316 lb 9.6 oz (143.6 kg)  09/25/23 (!) 314 lb (142.4 kg)  09/17/23 (!) 310 lb 9.6 oz (140.9 kg)    Physical Exam Constitutional:      General: She is awake. She is not in acute distress.    Appearance: Normal appearance. She is well-developed and well-groomed. She is morbidly obese. She is not ill-appearing, toxic-appearing or diaphoretic.  Cardiovascular:     Rate and Rhythm: Normal rate and regular rhythm.     Heart sounds: Normal heart sounds.  Pulmonary:     Effort: Pulmonary effort is normal.     Breath sounds: Normal breath sounds.  Musculoskeletal:     Comments: Generalized weakness, using wheelchair   Skin:    General: Skin is warm.     Capillary Refill: Capillary refill takes less than 2 seconds.  Neurological:     General: No focal deficit present.     Mental Status: She is alert, oriented to person, place, and time and easily aroused. Mental status is at baseline.  Psychiatric:        Attention and Perception: Attention and perception normal.        Mood  and Affect: Mood is depressed. Affect is blunt and flat.         Speech: Speech normal.        Behavior: Behavior is slowed and withdrawn. Behavior is cooperative.        Thought Content: Thought content normal.        Cognition and Memory: Cognition and memory normal.        Judgment: Judgment normal.     Results for orders placed or performed in visit on 01/09/23  Bayer DCA Hb A1c Waived   Collection Time: 01/09/23  2:34 PM  Result Value Ref Range   HB A1C (BAYER DCA - WAIVED) 6.3 (H) 4.8 - 5.6 %  Microalbumin / creatinine urine ratio   Collection Time: 01/09/23  2:40 PM  Result Value Ref Range   Creatinine, Urine 192.3 Not Estab. mg/dL   Microalbumin, Urine 16.1 Not Estab. ug/mL   Microalb/Creat Ratio 11 0 - 29 mg/g creat  Hepatic function panel   Collection Time: 01/09/23  3:06 PM  Result Value Ref Range   Total Protein 7.2 6.0 - 8.5 g/dL   Albumin 4.0 3.8 - 4.9 g/dL   Bilirubin Total <0.9 0.0 - 1.2 mg/dL   Bilirubin, Direct <6.04 0.00 - 0.40 mg/dL   Alkaline Phosphatase 88 44 - 121 IU/L   AST 44 (H) 0 - 40 IU/L   ALT 22 0 - 32 IU/L  VITAMIN D  25 Hydroxy (Vit-D Deficiency, Fractures)   Collection Time: 01/09/23  3:06 PM  Result Value Ref Range   Vit D, 25-Hydroxy 37.2 30.0 - 100.0 ng/mL  Cologuard   Collection Time: 01/12/23  4:27 PM  Result Value Ref Range   COLOGUARD Negative Negative       02/06/2024    2:08 PM 09/25/2023    2:32 PM 09/17/2023    2:25 PM 01/09/2023    1:58 PM 10/24/2022   10:20 AM  Depression screen PHQ 2/9  Decreased Interest 3 3 3 3    Down, Depressed, Hopeless 3 3 3 3    PHQ - 2 Score 6 6 6 6    Altered sleeping 3 3 3 3    Tired, decreased energy 3 3 3 3    Change in appetite 3 3 3 1    Feeling bad or failure about yourself  3 3 3 3    Trouble concentrating 3 3 3 3    Moving slowly or fidgety/restless 0 1 0 0   Suicidal thoughts 0 1 0 0   PHQ-9 Score 21 23 21 19    Difficult doing work/chores Very difficult Very difficult Extremely dIfficult Very difficult      Information is confidential and restricted.  Go to Review Flowsheets to unlock data.       02/06/2024    2:09 PM 09/25/2023    2:31 PM 09/17/2023    2:25 PM 01/09/2023    1:57 PM  GAD 7 : Generalized Anxiety Score  Nervous, Anxious, on Edge 3 3 3 3   Control/stop worrying 3 3 3 3   Worry too much - different things 3 3 3 3   Trouble relaxing 3 3 3 3   Restless 3 3 3 3   Easily annoyed or irritable 3 3 3 3   Afraid - awful might happen 2 3 3 3   Total GAD 7 Score 20 21 21 21   Anxiety Difficulty Very difficult  Somewhat difficult Somewhat difficult   Pertinent labs & imaging results that were available during my care of the patient were reviewed by me and  considered in my medical decision making.  Assessment & Plan:  Merinda was seen today for depression.  Diagnoses and all orders for this visit:  1. Severe episode of recurrent major depressive disorder, without psychotic features (HCC) (Primary) Denies active plans of self harm.  Reviewed notes from [place], DO 09/25/2023 Reviewed notes from [place], MD 05/15/2023 Reviewed notes from [place], MD 09/17/2023 Patient not currently taking Remeron . Will not restart as she is taking seroquel . Will refer to psychiatry for further evaluation. Referral placed for patient to follow up with counseling in office as well.  Patient and/or legal guardian verbally consented to [facility] services about presenting concerns and psychiatric consultation as appropriate.  The services will be billed as appropriate for the patient. Patient was previously instructed to seek inpatient care and she declined. Provided patient with contact information and resources for [facility] urgent care and [facility] inpatient treatment.  - Ambulatory referral to Integrated Behavioral Health - Ambulatory referral to Psychiatry  2. PTSD (post-traumatic stress disorder) As above.  - Ambulatory referral to Integrated Behavioral Health - Ambulatory referral to Psychiatry  3. Generalized anxiety disorder with panic  attacks As above.  - Ambulatory referral to Integrated Behavioral Health - Ambulatory referral to Psychiatry  4. Other fatigue Labs as below. Will communicate results to patient once available. Will await results to determine next steps.  - CMP14+EGFR - VITAMIN D  25 Hydroxy (Vit-D Deficiency, Fractures) - Anemia Profile B  5. Vitamin D  deficiency - VITAMIN D  25 Hydroxy (Vit-D Deficiency, Fractures)  Continue all other maintenance medications.  Follow up plan: Return in about 4 weeks (around 03/05/2024) for w/ PCP for depression follow up .   Continue healthy lifestyle choices, including diet (rich in fruits, vegetables, and lean proteins, and low in salt and simple carbohydrates) and exercise (at least 30 minutes of moderate physical activity daily).  Written and verbal instructions provided   The above assessment and management plan was discussed with the patient. The patient verbalized understanding of and has agreed to the management plan. Patient is aware to call the clinic if they develop any new symptoms or if symptoms persist or worsen. Patient is aware when to return to the clinic for a follow-up visit. Patient educated on when it is appropriate to go to the emergency department.   Jacqualyn Mates, DNP-FNP [facility] [address] [place], [place] [address] [phone]

## 2024-02-06 NOTE — Patient Instructions (Addendum)
 [facility]  Phone: [phone] Address: [address] [place], [place] [address] Hours:Open 24/7, No appointment required.  The [facility] ([facility]) is centrally located in the [place] at [facility], [place]   [facility], located at [address] in [place], [place] , is an 80-bed facility that specializes in helping children, adolescents and adults cope with mental health and/or addiction issues. Wikipedia Address: [address] [place], [place] [address] Phone: [phone]

## 2024-02-07 ENCOUNTER — Telehealth: Payer: Self-pay

## 2024-02-07 ENCOUNTER — Ambulatory Visit: Payer: Self-pay | Admitting: Family Medicine

## 2024-02-07 LAB — VITAMIN D 25 HYDROXY (VIT D DEFICIENCY, FRACTURES): Vit D, 25-Hydroxy: 29 ng/mL — ABNORMAL LOW (ref 30.0–100.0)

## 2024-02-07 LAB — CMP14+EGFR
ALT: 60 IU/L — ABNORMAL HIGH (ref 0–32)
AST: 91 IU/L — ABNORMAL HIGH (ref 0–40)
Albumin: 4.5 g/dL (ref 3.8–4.9)
Alkaline Phosphatase: 102 IU/L (ref 44–121)
BUN/Creatinine Ratio: 10 (ref 9–23)
BUN: 9 mg/dL (ref 6–24)
Bilirubin Total: 0.3 mg/dL (ref 0.0–1.2)
CO2: 21 mmol/L (ref 20–29)
Calcium: 9.7 mg/dL (ref 8.7–10.2)
Chloride: 98 mmol/L (ref 96–106)
Creatinine, Ser: 0.86 mg/dL (ref 0.57–1.00)
Globulin, Total: 3.1 g/dL (ref 1.5–4.5)
Glucose: 238 mg/dL — ABNORMAL HIGH (ref 70–99)
Potassium: 4.7 mmol/L (ref 3.5–5.2)
Sodium: 138 mmol/L (ref 134–144)
Total Protein: 7.6 g/dL (ref 6.0–8.5)
eGFR: 81 mL/min/{1.73_m2} (ref >59)

## 2024-02-07 LAB — ANEMIA PROFILE B
Basophils Absolute: 0.1 10*3/uL (ref 0.0–0.2)
Basos: 1 %
EOS (ABSOLUTE): 0.1 10*3/uL (ref 0.0–0.4)
Eos: 1 %
Ferritin: 349 ng/mL — ABNORMAL HIGH (ref 15–150)
Folate: 4.2 ng/mL (ref >3.0)
Hematocrit: 51.3 % — ABNORMAL HIGH (ref 34.0–46.6)
Hemoglobin: 16.8 g/dL — ABNORMAL HIGH (ref 11.1–15.9)
Immature Grans (Abs): 0 10*3/uL (ref 0.0–0.1)
Immature Granulocytes: 0 %
Iron Saturation: 23 % (ref 15–55)
Iron: 75 ug/dL (ref 27–159)
Lymphocytes Absolute: 2.6 10*3/uL (ref 0.7–3.1)
Lymphs: 29 %
MCH: 31.2 pg (ref 26.6–33.0)
MCHC: 32.7 g/dL (ref 31.5–35.7)
MCV: 95 fL (ref 79–97)
Monocytes Absolute: 0.6 10*3/uL (ref 0.1–0.9)
Monocytes: 7 %
Neutrophils Absolute: 5.6 10*3/uL (ref 1.4–7.0)
Neutrophils: 62 %
Platelets: 219 10*3/uL (ref 150–450)
RBC: 5.39 x10E6/uL — ABNORMAL HIGH (ref 3.77–5.28)
RDW: 12.5 % (ref 11.7–15.4)
Retic Ct Pct: 1.9 % (ref 0.6–2.6)
Total Iron Binding Capacity: 327 ug/dL (ref 250–450)
UIBC: 252 ug/dL (ref 131–425)
Vitamin B-12: 420 pg/mL (ref 232–1245)
WBC: 9.1 10*3/uL (ref 3.4–10.8)

## 2024-02-07 NOTE — Telephone Encounter (Signed)
 Copied from CRM [id number]. Topic: Clinical - Lab/Test Results >> Feb 07, 2024  2:19 PM [name] wrote: Reason for CRM: Pt calling to go over lab results. Please follow up with pt

## 2024-02-07 NOTE — Progress Notes (Signed)
 Recommend that patient follow up with PCP and psychiatry. Elevated BG, please add on A1C so that patient can follow up with PCP for management of diabetes. Elevated LFTs. Slightly decreased vitamin D . Recommend starting 1000-2000 international units daily of OTC supplement. Elevated ferritin, RBC, Hgb, and Hct. Recommend repeat in 3 months. Elevated LFTs and CBC correlate to hepatic steatosis on CT 09/19/2017. Monitor for signs of cirrhosis such as blood in your bowel movements or vomit, symptoms of infection, belly pain, swollen legs or ankles, trouble breathing, extreme tiredness, confusion, yellowing of the skin or whites of your eyes, called jaundice.

## 2024-02-07 NOTE — Telephone Encounter (Signed)
 Labs have not been reviewed. Please contact patient once reviewed

## 2024-02-08 NOTE — Telephone Encounter (Signed)
 These were not ordered by me.

## 2024-02-11 LAB — SPECIMEN STATUS REPORT

## 2024-02-12 LAB — SPECIMEN STATUS REPORT

## 2024-02-12 LAB — HGB A1C W/O EAG: Hgb A1c MFr Bld: 7.9 % — ABNORMAL HIGH (ref 4.8–5.6)

## 2024-02-12 NOTE — Progress Notes (Signed)
 Recommend patient follow up with PCP for management of Diabetes.

## 2024-02-26 ENCOUNTER — Ambulatory Visit: Admitting: Professional Counselor

## 2024-02-26 DIAGNOSIS — F4310 Post-traumatic stress disorder, unspecified: Secondary | ICD-10-CM

## 2024-02-26 DIAGNOSIS — F410 Panic disorder [episodic paroxysmal anxiety] without agoraphobia: Secondary | ICD-10-CM

## 2024-02-26 DIAGNOSIS — F332 Major depressive disorder, recurrent severe without psychotic features: Secondary | ICD-10-CM

## 2024-02-26 DIAGNOSIS — F411 Generalized anxiety disorder: Secondary | ICD-10-CM
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
## 2024-02-27 ENCOUNTER — Telehealth: Payer: Self-pay

## 2024-02-27 NOTE — Telephone Encounter (Signed)
 Copied from CRM [id number]. Topic: Clinical - Prescription Issue >> Feb 27, 2024  3:48 PM Sasha H wrote: Reason for CRM: Pt is wanting to know if her pcp can prescribe her Cymbalta  because she is not sure who usually prescribes it but she is about to run out

## 2024-02-28 ENCOUNTER — Other Ambulatory Visit: Payer: Self-pay | Admitting: Family Medicine

## 2024-02-28 DIAGNOSIS — E1169 Type 2 diabetes mellitus with other specified complication: Secondary | ICD-10-CM

## 2024-02-29 ENCOUNTER — Other Ambulatory Visit: Payer: Self-pay | Admitting: Family Medicine

## 2024-02-29 DIAGNOSIS — F332 Major depressive disorder, recurrent severe without psychotic features: Secondary | ICD-10-CM

## 2024-02-29 DIAGNOSIS — F410 Panic disorder [episodic paroxysmal anxiety] without agoraphobia: Secondary | ICD-10-CM

## 2024-02-29 DIAGNOSIS — F4310 Post-traumatic stress disorder, unspecified: Secondary | ICD-10-CM

## 2024-02-29 NOTE — Telephone Encounter (Signed)
 Called but unable to lm on vm. Please notify pt of provider note if she calls back 6/20 LS

## 2024-02-29 NOTE — Telephone Encounter (Signed)
 This was previously managed by Dr Cathyann Cobia, who released her from care due to noncompliance with recommendations.  He hasn't rx'd since 05/2023.  How has she been getting it or has she been off it?  Looks like Polly Brink referred her to a new psychiatrist for med management.  They may put her on something else.

## 2024-03-07 ENCOUNTER — Telehealth (INDEPENDENT_AMBULATORY_CARE_PROVIDER_SITE_OTHER): Payer: Self-pay | Admitting: Professional Counselor

## 2024-03-07 DIAGNOSIS — F332 Major depressive disorder, recurrent severe without psychotic features: Secondary | ICD-10-CM | POA: Diagnosis not present
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
## 2024-03-11 ENCOUNTER — Other Ambulatory Visit: Payer: Self-pay | Admitting: Family Medicine

## 2024-03-11 DIAGNOSIS — E1169 Type 2 diabetes mellitus with other specified complication: Secondary | ICD-10-CM

## 2024-03-20 ENCOUNTER — Telehealth: Payer: Self-pay | Admitting: Family Medicine

## 2024-03-20 NOTE — Telephone Encounter (Signed)
 Copied from CRM [id number]. Topic: General - Other >> Mar 20, 2024  9:41 AM Willma SAUNDERS wrote: Reason for CRM: Patient is requesting a letter from Dr Jolinda that states her daughter in law is able to live with her to be her live-in care giver for her to give to her to landlord.  Patient can be reached at [phone] for additional information

## 2024-03-21 NOTE — Telephone Encounter (Signed)
 Okay to provide letter.  Indications include patient's impaired mobility secondary to morbid obesity and degenerative disc disease, major depressive disorder

## 2024-03-21 NOTE — Telephone Encounter (Signed)
 Letter written and left up front for pt to pick up. Left message advising to come by office.

## 2024-03-28 ENCOUNTER — Other Ambulatory Visit: Payer: Self-pay | Admitting: *Deleted

## 2024-03-28 NOTE — Telephone Encounter (Signed)
 Can we get a status update on patients Psychiatry referral? Has it been approved, are they ready to schedule?

## 2024-04-09 ENCOUNTER — Other Ambulatory Visit: Payer: Self-pay | Admitting: Family Medicine

## 2024-04-09 DIAGNOSIS — E1169 Type 2 diabetes mellitus with other specified complication: Secondary | ICD-10-CM

## 2024-04-16 ENCOUNTER — Ambulatory Visit: Admitting: Family Medicine

## 2024-04-16 ENCOUNTER — Telehealth: Payer: Self-pay | Admitting: *Deleted

## 2024-04-16 ENCOUNTER — Encounter: Payer: Self-pay | Admitting: Family Medicine

## 2024-04-16 VITALS — BP 126/68 | HR 104 | Temp 97.7°F | Ht 68.0 in | Wt 319.0 lb

## 2024-04-16 DIAGNOSIS — F411 Generalized anxiety disorder: Secondary | ICD-10-CM

## 2024-04-16 DIAGNOSIS — F410 Panic disorder [episodic paroxysmal anxiety] without agoraphobia: Secondary | ICD-10-CM

## 2024-04-16 DIAGNOSIS — F332 Major depressive disorder, recurrent severe without psychotic features: Secondary | ICD-10-CM

## 2024-04-16 DIAGNOSIS — M5442 Lumbago with sciatica, left side: Secondary | ICD-10-CM

## 2024-04-16 DIAGNOSIS — F4310 Post-traumatic stress disorder, unspecified: Secondary | ICD-10-CM

## 2024-04-16 DIAGNOSIS — E1159 Type 2 diabetes mellitus with other circulatory complications: Secondary | ICD-10-CM | POA: Diagnosis not present

## 2024-04-16 DIAGNOSIS — I152 Hypertension secondary to endocrine disorders: Secondary | ICD-10-CM

## 2024-04-16 DIAGNOSIS — E1169 Type 2 diabetes mellitus with other specified complication: Secondary | ICD-10-CM

## 2024-04-16 DIAGNOSIS — K5903 Drug induced constipation: Secondary | ICD-10-CM

## 2024-04-16 DIAGNOSIS — G8929 Other chronic pain: Secondary | ICD-10-CM

## 2024-04-16 DIAGNOSIS — E785 Hyperlipidemia, unspecified: Secondary | ICD-10-CM

## 2024-04-16 DIAGNOSIS — Z7985 Long-term (current) use of injectable non-insulin antidiabetic drugs: Secondary | ICD-10-CM

## 2024-04-16 LAB — BAYER DCA HB A1C WAIVED: HB A1C (BAYER DCA - WAIVED): 7.3 % — ABNORMAL HIGH (ref 4.8–5.6)

## 2024-04-16 LAB — LIPID PANEL

## 2024-04-16 MED ORDER — DULOXETINE HCL 60 MG PO CPEP: 120.0000 mg | ORAL_CAPSULE | Freq: Every day | ORAL | 0 refills | Status: DC

## 2024-04-16 MED ORDER — OMEPRAZOLE 20 MG PO CPDR: DELAYED_RELEASE_CAPSULE | ORAL | 3 refills | Status: AC

## 2024-04-16 MED ORDER — QUETIAPINE FUMARATE 50 MG PO TABS
50.0000 mg | ORAL_TABLET | Freq: Every day | ORAL | 0 refills | Status: DC
Start: 2024-04-16 — End: 2024-06-20

## 2024-04-16 MED ORDER — CYCLOBENZAPRINE HCL 10 MG PO TABS: 10.0000 mg | ORAL_TABLET | Freq: Three times a day (TID) | ORAL | 5 refills | Status: AC | PRN

## 2024-04-16 MED ORDER — TRULANCE 3 MG PO TABS: 1.0000 | ORAL_TABLET | Freq: Every day | ORAL | 3 refills | Status: DC

## 2024-04-16 NOTE — Telephone Encounter (Signed)
 I show the [facility] has reached out to Patient for Scheduling.  I also see where Patient was seen with Dr. Reida as well.

## 2024-04-16 NOTE — Telephone Encounter (Signed)
 Redell Corn is our in house therapist.  She needs a psychiatrist but apparently was told by [facility] that they weren't taking new patients?  We referred her there and they tried to call her for a visit so I'm super confused.  Are they taking new ones or no and if not I can place another referral for elsewhere.

## 2024-04-16 NOTE — Telephone Encounter (Signed)
 She has an active referral and her EMR shows multiple attempts of them trying to reach her to schedule OV.  I'm confused, are they or are they not trying to schedule her an OV?  Will cc to [facility] as well
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
## 2024-04-16 NOTE — Patient Instructions (Signed)
 Call to establish care with a psychiatrist: [facility] [address], [place] [address] [phone]  I will give you TEMPORARY supplies of your mental health meds but these really need to be managed by a psychiatrist.  They will need to provide you refills going forward.

## 2024-04-16 NOTE — Telephone Encounter (Unsigned)
 Copied from CRM #[id number]. Topic: Clinical - Medical Advice >> Apr 16, 2024 11:25 AM Nathanel BROCKS wrote: Reason for CRM: the behavioral clinic that you referred her to told her that they are not accepting any new pts at this time, do you have anything else?

## 2024-04-17 ENCOUNTER — Ambulatory Visit: Payer: Self-pay | Admitting: Family Medicine

## 2024-04-17 LAB — CBC
Hematocrit: 49.8 % — ABNORMAL HIGH (ref 34.0–46.6)
Hemoglobin: 16.2 g/dL — ABNORMAL HIGH (ref 11.1–15.9)
MCH: 30.6 pg (ref 26.6–33.0)
MCHC: 32.5 g/dL (ref 31.5–35.7)
MCV: 94 fL (ref 79–97)
Platelets: 231 x10E3/uL (ref 150–450)
RBC: 5.3 x10E6/uL — ABNORMAL HIGH (ref 3.77–5.28)
RDW: 11.8 % (ref 11.7–15.4)
WBC: 9.7 x10E3/uL (ref 3.4–10.8)

## 2024-04-17 LAB — CMP14+EGFR
ALT: 36 IU/L — AB (ref 0–32)
AST: 54 IU/L — AB (ref 0–40)
Albumin: 4.3 g/dL (ref 3.8–4.9)
Alkaline Phosphatase: 98 IU/L (ref 44–121)
BUN/Creatinine Ratio: 12 (ref 9–23)
BUN: 10 mg/dL (ref 6–24)
Bilirubin Total: 0.3 mg/dL (ref 0.0–1.2)
CO2: 21 mmol/L (ref 20–29)
Calcium: 9.9 mg/dL (ref 8.7–10.2)
Chloride: 98 mmol/L (ref 96–106)
Creatinine, Ser: 0.84 mg/dL (ref 0.57–1.00)
Globulin, Total: 3.6 g/dL (ref 1.5–4.5)
Glucose: 219 mg/dL — AB (ref 70–99)
Potassium: 4.1 mmol/L (ref 3.5–5.2)
Sodium: 136 mmol/L (ref 134–144)
Total Protein: 7.9 g/dL (ref 6.0–8.5)
eGFR: 84 mL/min/1.73 (ref >59)

## 2024-04-17 LAB — LIPID PANEL
Cholesterol, Total: 163 mg/dL (ref 100–199)
HDL: 37 mg/dL — AB (ref >39)
LDL CALC COMMENT:: 4.4 ratio (ref 0.0–4.4)
LDL Chol Calc (NIH): 103 mg/dL — AB (ref 0–99)
Triglycerides: 128 mg/dL (ref 0–149)
VLDL Cholesterol Cal: 23 mg/dL (ref 5–40)

## 2024-04-17 LAB — MICROALBUMIN / CREATININE URINE RATIO
Creatinine, Urine: 185.7 mg/dL
Microalb/Creat Ratio: 17 mg/g{creat} (ref 0–29)
Microalbumin, Urine: 31.9 ug/mL

## 2024-04-17 NOTE — Telephone Encounter (Signed)
 I have sent an Inbasket Message to the individual who called Patient to schedule per Referral Notes to clarify this :)

## 2024-04-18 ENCOUNTER — Telehealth: Payer: Self-pay | Admitting: Family Medicine

## 2024-04-18 DIAGNOSIS — G8929 Other chronic pain: Secondary | ICD-10-CM

## 2024-04-18 DIAGNOSIS — K5903 Drug induced constipation: Secondary | ICD-10-CM

## 2024-04-18 MED ORDER — LIDOCAINE 5 % EX PTCH: 1.0000 | MEDICATED_PATCH | CUTANEOUS | 0 refills | Status: DC

## 2024-04-18 MED ORDER — LUBIPROSTONE 24 MCG PO CAPS: 24.0000 ug | ORAL_CAPSULE | Freq: Two times a day (BID) | ORAL | 3 refills | Status: DC

## 2024-04-18 MED ORDER — LUBIPROSTONE 24 MCG PO CAPS: 24.0000 ug | ORAL_CAPSULE | Freq: Two times a day (BID) | ORAL | 3 refills | Status: AC

## 2024-04-18 NOTE — Telephone Encounter (Signed)
 I called patient to review her lab results with her. While on the call she requested to ask Dr Jolinda if she was still going to send in pain patches for her to the pharmacy, as discussed at her visit. Also wanted to let Dr Jolinda know that the bowel movement medication is not covered by her insurance at [facility].   Pt says she uses Counselling psychologist.

## 2024-04-18 NOTE — Telephone Encounter (Signed)
 Called and spoke with patient. Made patient aware that orders have been sent to [facility] as requested.

## 2024-04-18 NOTE — Telephone Encounter (Signed)
 Meds sent  Meds ordered this encounter  Medications   lidocaine  (LIDODERM ) 5 %    Sig: Place 1 patch onto the skin daily. Remove & Discard patch within 12 hours or as directed by MD    Dispense:  30 patch    Refill:  0   lubiprostone  (AMITIZA ) 24 MCG capsule    Sig: Take 1 capsule (24 mcg total) by mouth 2 (two) times daily with a meal. To REPLACE linzess     Dispense:  180 capsule    Refill:  3

## 2024-04-18 NOTE — Addendum Note (Signed)
 Addended by: [name] on: 04/18/2024 10:30 AM   Modules accepted: Orders

## 2024-04-18 NOTE — Telephone Encounter (Signed)
 Thank you Dr Jolinda! Can you send these to [facility] pharmacy? This is the pharmacy that patient says she normally uses.

## 2024-04-29 ENCOUNTER — Other Ambulatory Visit: Payer: Self-pay | Admitting: Family Medicine

## 2024-04-29 DIAGNOSIS — F410 Panic disorder [episodic paroxysmal anxiety] without agoraphobia: Secondary | ICD-10-CM

## 2024-04-29 DIAGNOSIS — F332 Major depressive disorder, recurrent severe without psychotic features: Secondary | ICD-10-CM

## 2024-04-29 DIAGNOSIS — H9201 Otalgia, right ear: Secondary | ICD-10-CM

## 2024-04-29 DIAGNOSIS — F4310 Post-traumatic stress disorder, unspecified: Secondary | ICD-10-CM

## 2024-06-07 ENCOUNTER — Other Ambulatory Visit: Payer: Self-pay | Admitting: Family Medicine

## 2024-06-07 DIAGNOSIS — E1159 Type 2 diabetes mellitus with other circulatory complications: Secondary | ICD-10-CM

## 2024-06-16 ENCOUNTER — Other Ambulatory Visit: Payer: Self-pay | Admitting: Family Medicine

## 2024-06-16 DIAGNOSIS — E1169 Type 2 diabetes mellitus with other specified complication: Secondary | ICD-10-CM

## 2024-06-18 ENCOUNTER — Other Ambulatory Visit: Payer: Self-pay | Admitting: Family Medicine

## 2024-06-18 DIAGNOSIS — F410 Panic disorder [episodic paroxysmal anxiety] without agoraphobia: Secondary | ICD-10-CM

## 2024-06-18 DIAGNOSIS — F332 Major depressive disorder, recurrent severe without psychotic features: Secondary | ICD-10-CM

## 2024-06-18 DIAGNOSIS — F4310 Post-traumatic stress disorder, unspecified: Secondary | ICD-10-CM

## 2024-06-20 NOTE — Telephone Encounter (Signed)
 This will be the last refill.  She was sent new patient paperwork for psychiatry. Please make sure she has received and completed that information so she can get scheduled.  They will have to take over her meds going forward.

## 2024-06-25 ENCOUNTER — Encounter (INDEPENDENT_AMBULATORY_CARE_PROVIDER_SITE_OTHER): Payer: Self-pay | Admitting: Gastroenterology

## 2024-08-01 ENCOUNTER — Ambulatory Visit (INDEPENDENT_AMBULATORY_CARE_PROVIDER_SITE_OTHER)

## 2024-08-01 ENCOUNTER — Ambulatory Visit (INDEPENDENT_AMBULATORY_CARE_PROVIDER_SITE_OTHER): Admitting: Podiatry

## 2024-08-01 ENCOUNTER — Encounter: Payer: Self-pay | Admitting: Podiatry

## 2024-08-01 VITALS — Ht 68.0 in | Wt 319.0 lb

## 2024-08-01 DIAGNOSIS — M7752 Other enthesopathy of left foot: Secondary | ICD-10-CM | POA: Diagnosis not present

## 2024-08-01 DIAGNOSIS — E1149 Type 2 diabetes mellitus with other diabetic neurological complication: Secondary | ICD-10-CM

## 2024-08-01 DIAGNOSIS — E1140 Type 2 diabetes mellitus with diabetic neuropathy, unspecified: Secondary | ICD-10-CM

## 2024-08-01 MED ORDER — TRIAMCINOLONE ACETONIDE 10 MG/ML IJ SUSP
10.0000 mg | Freq: Once | INTRAMUSCULAR | Status: AC
  Administered 2024-08-01: 10 mg via INTRA_ARTICULAR

## 2024-08-04 NOTE — Progress Notes (Signed)
 Subjective:   Patient ID: Aimee Phillips, female   DOB: 53 y.o.   MRN: [id number]   HPI Patient has long-term history of diabetic neuropathy and complains about a lot of discomfort in the plantar metatarsal phalangeal joints by lateral with the left being worse.  Patient states cannot walk on any kind of floor that is hard and that has been going on for several months.  Patient does not smoke has significant obesity symptoms and diabetes that is now under good control   Review of Systems  All other systems reviewed and are negative.       Objective:  Physical Exam Vitals and nursing note reviewed.  Constitutional:      Appearance: She is well-developed.  Pulmonary:     Effort: Pulmonary effort is normal.  Musculoskeletal:        General: Normal range of motion.  Skin:    General: Skin is warm.  Neurological:     Mental Status: She is alert.     Neurovascular status found to be diminished from a sharp dull vibratory perspective with vascular status intact slightly reduced but palpable.  Patient has a lot of discomfort around the lesser MPJs left mostly centered around the 2nd and 3rd that is more acute in its nature with mild edema and has good digital perfusion well-oriented x 3     Assessment:  Patient with history of diabetic neuropathy with inflammatory capsulitis lesser MPJs left painful     Plan:  H&P x-ray reviewed separated diabetic neuropathy from the probability of inflammatory capsulitis and went ahead sterile prep and did periarticular injection around the 2nd and 3rd MPJs left with 3 mg dexamethasone  Kenalog  5 mg Xylocaine  applied sterile dressing advised on rigid bottom shoes reappoint to recheck  X-rays indicate no signs of fracture no signs of arthritis within the forefoot left

## 2024-08-15 ENCOUNTER — Other Ambulatory Visit: Payer: Self-pay | Admitting: Family Medicine

## 2024-08-15 DIAGNOSIS — F332 Major depressive disorder, recurrent severe without psychotic features: Secondary | ICD-10-CM

## 2024-08-15 DIAGNOSIS — F410 Panic disorder [episodic paroxysmal anxiety] without agoraphobia: Secondary | ICD-10-CM

## 2024-08-15 DIAGNOSIS — F4310 Post-traumatic stress disorder, unspecified: Secondary | ICD-10-CM

## 2024-08-15 NOTE — Telephone Encounter (Signed)
  The original prescription was discontinued on 04/16/2024 by [name], DO. Renewing this prescription may not be appropriate.

## 2024-08-18 ENCOUNTER — Encounter: Payer: Self-pay | Admitting: Family Medicine

## 2024-08-18 ENCOUNTER — Ambulatory Visit: Payer: Self-pay | Admitting: Family Medicine

## 2024-08-18 DIAGNOSIS — E119 Type 2 diabetes mellitus without complications: Secondary | ICD-10-CM | POA: Insufficient documentation

## 2024-08-18 NOTE — Patient Instructions (Incomplete)
 Please schedule annual physical with fasting labs ASAP for June. You are OVERDUE on: Pap smear (can be done with your physical here or at your Ob/GYN) Mammogram (can be scheduled on mobile mammo bus here if you want) Colonoscopy

## 2024-08-18 NOTE — Progress Notes (Deleted)
 Subjective: CC:DM PCP: [name], DO YEP:Jwhzoj D Tino is a 53 y.o. female presenting to clinic today for:  Type 2 Diabetes with hypertension, hyperlipidemia:  Glucometer:***.   ROS: ***dizziness, LOC, polyuria, polydipsia, unintended weight loss/gain, foot ulcerations, numbness or tingling in extremities, shortness of breath or chest pain.   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   COPD (chronic obstructive pulmonary disease) (HCC)    Depression    Diabetes mellitus without complication (HCC)    Type II   PE (pulmonary embolism)    PONV (postoperative nausea and vomiting)    Pulmonary edema     Current Outpatient Medications:    budesonide -formoterol  (SYMBICORT ) 160-4.5 MCG/ACT inhaler, Inhale 2 puffs into the lungs 2 (two) times daily., Disp: 10.2 g, Rfl: 11   Cholecalciferol (VITAMIN D3 GUMMIES) 25 MCG (1000 UT) CHEW, Chew by mouth., Disp: , Rfl:    cyclobenzaprine  (FLEXERIL ) 10 MG tablet, Take 1 tablet (10 mg total) by mouth 3 (three) times daily as needed for muscle spasms., Disp: 90 tablet, Rfl: 5   docusate sodium  (COLACE) 100 MG capsule, Take 100 mg by mouth daily as needed (constipation.)., Disp: , Rfl:    DULoxetine  (CYMBALTA ) 60 MG capsule, Take 2 capsules (120 mg total) by mouth daily. Further fills per psychiatry, Disp: 180 capsule, Rfl: 0   furosemide  (LASIX ) 40 MG tablet, TAKE ONE TABLET BY MOUTH TWICE DAILY, Disp: 60 tablet, Rfl: 1   glucose blood (ACCU-CHEK AVIVA) test strip, Use to check blood sugar qd e11.69, Disp: 100 each, Rfl: 3   Insulin  Pen Needle (EASY COMFORT PEN NEEDLES) 31G X 5 MM MISC, UAD for insulin  injection E11.65, Disp: 100 each, Rfl:  0   Lancet Devices MISC, Test BGs QD, DX E11.69, Accucheck, Disp: 100 each, Rfl: prn   levocetirizine (XYZAL ) 5 MG tablet, TAKE 1 TABLET BY MOUTH EVERY EVENING FOR allergies, Disp: 30 tablet, Rfl: 5   lidocaine  (LIDODERM ) 5 %, Place 1 patch onto the skin daily. Remove & Discard patch within 12 hours or as directed by MD, Disp: 30 patch, Rfl: 0   lisinopril  (ZESTRIL ) 5 MG tablet, TAKE 1 TABLET BY MOUTH ONCE DAILY, Disp: 90 tablet, Rfl: 0   loratadine  (CLARITIN ) 10 MG tablet, Take 1 tablet by mouth daily., Disp: , Rfl:    lubiprostone  (AMITIZA ) 24 MCG capsule, Take 1 capsule (24 mcg total) by mouth 2 (two) times daily with a meal. To REPLACE linzess , Disp: 180 capsule, Rfl: 3   mirtazapine  (REMERON ) 15 MG tablet, Take 15 mg by mouth at bedtime., Disp: , Rfl:    Misc. Devices (TRANSPORT CHAIR) MISC, Needs lift chair for >300lbs, Disp: 1 each, Rfl: 0   omeprazole  (PRILOSEC) 20 MG capsule, TAKE ONE CAPSULE BY MOUTH ONCE DAILY, Disp: 90 capsule, Rfl: 3   oxyCODONE -acetaminophen  (PERCOCET) 10-325 MG tablet, Take 1 tablet by mouth every 4 (four) hours as needed for pain., Disp: , Rfl:    pregabalin (LYRICA) 75 MG capsule, Take 75 mg by mouth 3 (three) times daily as needed., Disp: , Rfl:    QUEtiapine  (SEROQUEL ) 50 MG tablet, TAKE 1 TABLET BY MOUTH EVERYDAY AT BEDTIME,  Disp: 90 tablet, Rfl: 0   rosuvastatin  (CRESTOR ) 20 MG tablet, TAKE 1 TABLET BY MOUTH ONCE DAILY, Disp: 90 tablet, Rfl: 0   Semaglutide , 2 MG/DOSE, (OZEMPIC , 2 MG/DOSE,) 8 MG/3ML SOPN, INJECT 2 MG AS DIRECTED ONCE A WEEK., Disp: 9 mL, Rfl: 0   solifenacin  (VESICARE ) 10 MG tablet, Take 1 tablet (10 mg total) by mouth daily., Disp: 90 tablet, Rfl: 3   topiramate (TOPAMAX) 100 MG tablet, Take 100 mg by mouth in the morning., Disp: , Rfl:  Social History   Socioeconomic History   Marital status: Married    Spouse name: Veena Sturgess   Number of children: 3   Years of education: 12   Highest education level: High school graduate   Occupational History   Occupation: Disabled  Tobacco Use   Smoking status: Never    Passive exposure: Never   Smokeless tobacco: Never  Vaping Use   Vaping status: Never Used  Substance and Sexual Activity   Alcohol  use: No   Drug use: No   Sexual activity: Not Currently    Partners: Male  Other Topics Concern   Not on file  Social History Narrative   Not on file   Social Drivers of Health   Financial Resource Strain: Low Risk  (08/09/2022)   Overall Financial Resource Strain (CARDIA)    Difficulty of Paying Living Expenses: Not hard at all  Food Insecurity: Food Insecurity Present (08/09/2022)   Hunger Vital Sign    Worried About Running Out of Food in the Last Year: Sometimes true    Ran Out of Food in the Last Year: Never true  Transportation Needs: Unmet Transportation Needs (08/09/2022)   PRAPARE - Administrator, Civil Service (Medical): Yes    Lack of Transportation (Non-Medical): No  Physical Activity: Inactive (08/09/2022)   Exercise Vital Sign    Days of Exercise per Week: 0 days    Minutes of Exercise per Session: 0 min  Stress: No Stress Concern Present (08/09/2022)   [employer] of Occupational Health - Occupational Stress Questionnaire    Feeling of Stress : Not at all  Social Connections: Socially Integrated (08/09/2022)   Social Connection and Isolation Panel    Frequency of Communication with Friends and Family: Three times a week    Frequency of Social Gatherings with Friends and Family: Three times a week    Attends Religious Services: More than 4 times per year    Active Member of Clubs or Organizations: Yes    Attends Banker Meetings: 1 to 4 times per year    Marital Status: Married  Catering Manager Violence: Not At Risk (08/09/2022)   Humiliation, Afraid, Rape, and Kick questionnaire    Fear of Current or Ex-Partner: No    Emotionally Abused: No    Physically Abused: No    Sexually Abused: No   Family History   Problem Relation Age of Onset   Cancer Mother        breast   Heart failure Father        died of AMI   Cancer Sister    Diabetes Brother    Heart disease Sister     Objective: Office vital signs reviewed. LMP  (LMP Unknown)   Physical Examination:  General: Awake, alert, *** nourished, No acute distress HEENT: Normal    Neck: No masses palpated. No lymphadenopathy    Ears: Tympanic membranes intact, normal light reflex, no erythema, no bulging  Eyes: PERRLA, extraocular membranes intact, sclera ***    Nose: nasal turbinates moist, *** nasal discharge    Throat: moist mucus membranes, no erythema, *** tonsillar exudate.  Airway is patent Cardio: regular rate and rhythm, S1S2 heard, no murmurs appreciated Pulm: clear to auscultation bilaterally, no wheezes, rhonchi or rales; normal work of breathing on room air GI: soft, non-tender, non-distended, bowel sounds present x4, no hepatomegaly, no splenomegaly, no masses GU: external vaginal tissue ***, cervix ***, *** punctate lesions on cervix appreciated, *** discharge from cervical os, *** bleeding, *** cervical motion tenderness, *** abdominal/ adnexal masses Extremities: warm, well perfused, No edema, cyanosis or clubbing; +*** pulses bilaterally MSK: *** gait and *** station Skin: dry; intact; no rashes or lesions Neuro: *** Strength and light touch sensation grossly intact, *** DTRs ***/4  Diabetic Foot Exam - Simple   No data filed     Lab Results  Component Value Date   HGBA1C 7.3 (H) 04/16/2024    Assessment/ Plan: 53 y.o. female   Type 2 diabetes mellitus with other specified complication, without long-term current use of insulin  (HCC)  Hyperlipidemia associated with type 2 diabetes mellitus (HCC)  Hypertension associated with diabetes (HCC)  ***   Tacori Kvamme CHRISTELLA Fielding, DO [facility] [phone]

## 2024-08-26 ENCOUNTER — Ambulatory Visit: Admitting: Family Medicine

## 2024-08-26 ENCOUNTER — Encounter: Payer: Self-pay | Admitting: Family Medicine

## 2024-08-26 ENCOUNTER — Ambulatory Visit: Payer: Self-pay | Admitting: Family Medicine

## 2024-08-26 VITALS — BP 128/70 | HR 91 | Temp 97.8°F | Ht 68.0 in | Wt 320.2 lb

## 2024-08-26 DIAGNOSIS — E662 Morbid (severe) obesity with alveolar hypoventilation: Secondary | ICD-10-CM

## 2024-08-26 DIAGNOSIS — Z23 Encounter for immunization: Secondary | ICD-10-CM | POA: Diagnosis not present

## 2024-08-26 DIAGNOSIS — N3281 Overactive bladder: Secondary | ICD-10-CM

## 2024-08-26 DIAGNOSIS — E1169 Type 2 diabetes mellitus with other specified complication: Secondary | ICD-10-CM

## 2024-08-26 DIAGNOSIS — F332 Major depressive disorder, recurrent severe without psychotic features: Secondary | ICD-10-CM

## 2024-08-26 DIAGNOSIS — Z7985 Long-term (current) use of injectable non-insulin antidiabetic drugs: Secondary | ICD-10-CM

## 2024-08-26 DIAGNOSIS — G44229 Chronic tension-type headache, not intractable: Secondary | ICD-10-CM

## 2024-08-26 DIAGNOSIS — I152 Hypertension secondary to endocrine disorders: Secondary | ICD-10-CM

## 2024-08-26 DIAGNOSIS — F410 Panic disorder [episodic paroxysmal anxiety] without agoraphobia: Secondary | ICD-10-CM

## 2024-08-26 DIAGNOSIS — F4310 Post-traumatic stress disorder, unspecified: Secondary | ICD-10-CM

## 2024-08-26 LAB — BAYER DCA HB A1C WAIVED: HB A1C (BAYER DCA - WAIVED): 7.3 % — ABNORMAL HIGH (ref 4.8–5.6)

## 2024-08-26 MED ORDER — GLIPIZIDE ER 2.5 MG PO TB24: 2.5000 mg | ORAL_TABLET | Freq: Every day | ORAL | 3 refills | Status: AC

## 2024-08-26 MED ORDER — LANCETS MISC: 3 refills | Status: AC

## 2024-08-26 MED ORDER — TOPIRAMATE 100 MG PO TABS: 100.0000 mg | ORAL_TABLET | Freq: Every morning | ORAL | 3 refills | Status: AC

## 2024-08-26 MED ORDER — SOLIFENACIN SUCCINATE 10 MG PO TABS: 10.0000 mg | ORAL_TABLET | Freq: Every day | ORAL | 3 refills | Status: AC

## 2024-08-26 MED ORDER — ROSUVASTATIN CALCIUM 20 MG PO TABS: 20.0000 mg | ORAL_TABLET | Freq: Every day | ORAL | 0 refills | Status: AC

## 2024-08-26 MED ORDER — OZEMPIC (2 MG/DOSE) 8 MG/3ML ~~LOC~~ SOPN: 2.0000 mg | PEN_INJECTOR | SUBCUTANEOUS | 4 refills | Status: AC

## 2024-08-26 MED ORDER — LANCET DEVICE MISC: 0 refills | Status: AC

## 2024-08-26 MED ORDER — BLOOD GLUCOSE TEST VI STRP: ORAL_STRIP | 3 refills | Status: AC

## 2024-08-26 MED ORDER — BLOOD GLUCOSE MONITORING SUPPL DEVI: 0 refills | Status: AC

## 2024-08-26 NOTE — Progress Notes (Signed)
 Subjective: CC:DM PCP: [name], DO YEP:Aimee Phillips is a 53 y.o. female presenting to clinic today for:  Type 2 Diabetes with hypertension, hyperlipidemia associated with morbid obesity and obesity hypoventilation syndrome:  She reports that she does not monitor blood sugars because she does not have an active working meter.  She denies any symptoms of hyper or hypoglycemia.  No chest pain, shortness of breath, blurred vision, polydipsia or polyuria.  Compliant with all medications.  Denies any GI side effects.  Diabetes Health Maintenance Due  Topic Date Due   FOOT EXAM  05/23/2023   OPHTHALMOLOGY EXAM  06/23/2023   HEMOGLOBIN A1C  10/17/2024   She continues to have a stressful situation at home.  She plans on moving in with her sister on Thursday in [place].  Would like to have psychiatry referral rerouted to [facility].  She does not feel safe staying with her husband.  Her children have been upset with her that she wants to move and have been guilt tripping her as a result.  She voices concern because her 85 year old son does not want to move with her to [place] and will remain with his father here in [place].  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   COPD (chronic obstructive pulmonary disease) (HCC)    Depression    Diabetes mellitus without complication (HCC)    Type II   PE (pulmonary embolism)    PONV (postoperative nausea and vomiting)    Pulmonary edema    Current Medications[2] Social History   Socioeconomic History   Marital status: Married    Spouse name: Adelena Desantiago   Number of children: 3   Years of education: 12   Highest education level: High school graduate  Occupational History   Occupation: Disabled  Tobacco Use   Smoking status: Never    Passive exposure: Never    Smokeless tobacco: Never  Vaping Use   Vaping status: Never Used  Substance and Sexual Activity   Alcohol  use: No   Drug use: No   Sexual activity: Not Currently    Partners: Male  Other Topics Concern   Not on file  Social History Narrative   Not on file   Social Drivers of Health   Tobacco Use: Low Risk (08/01/2024)   Patient History    Smoking Tobacco Use: Never    Smokeless Tobacco Use: Never    Passive Exposure: Never  Financial Resource Strain: Low Risk (08/09/2022)   Overall Financial Resource Strain (CARDIA)    Difficulty of Paying Living Expenses: Not hard at all  Food Insecurity: Food Insecurity Present (08/09/2022)   Hunger Vital Sign    Worried About Running Out of Food in the Last Year: Sometimes true    Ran Out of Food in the Last Year: Never true  Transportation Needs: Unmet Transportation Needs (08/09/2022)   PRAPARE - Administrator, Civil Service (Medical): Yes    Lack of Transportation (Non-Medical): No  Physical Activity: Inactive (08/09/2022)   Exercise Vital Sign    Days of Exercise per Week: 0 days    Minutes of Exercise per Session: 0 min  Stress: No Stress Concern Present (08/09/2022)   [employer] of Occupational Health - Occupational Stress Questionnaire    Feeling of Stress : Not at all  Social Connections: Socially Integrated (08/09/2022)   Social Connection and Isolation Panel    Frequency of Communication with Friends and Family: Three times a week    Frequency of Social Gatherings with Friends and Family: Three times a week    Attends Religious Services: More than 4 times per year    Active Member of Clubs or Organizations: Yes    Attends Banker Meetings: 1 to 4 times per year    Marital Status: Married  Catering Manager Violence: Not At Risk (08/09/2022)   Humiliation, Afraid, Rape, and Kick questionnaire    Fear of Current or Ex-Partner: No    Emotionally Abused: No    Physically Abused: No    Sexually  Abused: No  Depression (PHQ2-9): High Risk (04/16/2024)   Depression (PHQ2-9)    PHQ-2 Score: 21  Alcohol  Screen: Low Risk (08/09/2022)   Alcohol  Screen    Last Alcohol  Screening Score (AUDIT): 0  Housing: Low Risk (08/09/2022)   Housing    Last Housing Risk Score: 0  Utilities: Not At Risk (08/09/2022)   AHC Utilities    Threatened with loss of utilities: No  Health Literacy: Low Risk (09/23/2021)   Received from [facility] Literacy    How often do you need to have someone help you when you read instructions, pamphlets, or other written material from your doctor or pharmacy?: Never   Family History  Problem Relation Age of Onset   Cancer Mother        breast   Heart failure Father        died of AMI   Cancer Sister    Diabetes Brother    Heart disease Sister     Objective: Office vital signs reviewed. BP 128/70   Pulse 91   Temp 97.8 F (36.6 C)   Ht 5' 8 (1.727 m)   Wt (!) 320 lb 4 oz (145.3 kg)   LMP  (LMP Unknown)   SpO2 94%   BMI 48.69 kg/m   Physical Examination:  General: Awake, alert, morbidly obese, No acute distress HEENT: Sclera white.  Moist mucous membranes.  Poor dentition with several broken teeth on the bottom Cardio: regular rate and rhythm, S1S2 heard, no murmurs appreciated Pulm: clear to auscultation bilaterally, no wheezes, rhonchi or rales; normal work of breathing on room air MSK: arrives in wheelchair  Diabetic Foot Exam - Simple   Simple Foot Form Diabetic Foot exam was performed with the following findings: Yes 08/26/2024  9:08 AM  Visual Inspection No deformities, no ulcerations, no other skin breakdown bilaterally: Yes Sensation Testing Intact to touch and monofilament testing bilaterally: Yes Pulse Check Posterior Tibialis and Dorsalis pulse intact bilaterally: Yes Comments     Lab Results  Component Value Date   HGBA1C 7.3 (H) 04/16/2024    Assessment/ Plan: 53 y.o. female   Diabetes mellitus treated with  injections of non-insulin  medication (HCC) - Plan: Bayer DCA Hb A1c Waived, Semaglutide , 2 MG/DOSE, (OZEMPIC , 2 MG/DOSE,) 8 MG/3ML SOPN, Blood Glucose Monitoring Suppl DEVI, Glucose Blood (BLOOD GLUCOSE TEST STRIPS) STRP, Lancet Device MISC, Lancets MISC  Hypertension associated with diabetes (HCC)  Hyperlipidemia due to type 2 diabetes mellitus (HCC) - Plan: rosuvastatin  (CRESTOR ) 20 MG tablet  Morbid obesity (HCC)  Obesity hypoventilation syndrome (HCC)  Chronic tension-type headache, not intractable - Plan: topiramate  (TOPAMAX ) 100 MG tablet  OAB (overactive bladder) - Plan: solifenacin  (VESICARE ) 10 MG tablet  Severe episode of recurrent major depressive disorder,  without psychotic features (HCC) - Plan: Ambulatory referral to Psychology, Ambulatory referral to Psychiatry  Generalized anxiety disorder with panic attacks - Plan: Ambulatory referral to Psychology, Ambulatory referral to Psychiatry  PTSD (post-traumatic stress disorder) - Plan: Ambulatory referral to Psychology, Ambulatory referral to Psychiatry  Check 1C.  Continue medications as prescribed.  New glucose meter sent.  Foot exam performed.  She will have diabetic eye exam done after the new year  Blood pressure controlled.  No changes  Continue statin.  Declined venous stick for fasting labs today  Has greatly reduced her weight but still in morbid obese category with associated comorbidities including obesity hypoventilation syndrome which I suspect is what precipitates her chronic tension headaches.  Topamax  is somewhat helpful and so I renewed that for her  Did not discuss overactive bladder in detail but needed Vesicare  renewed  Much of the appointment was spent talking about the stressors at home.  I placed a new referral to psychology and psychiatry for [facility] and gave them her updated cell phone number to contact.  She will have her spouse removed from her HIPAA form today as she plans on separating     Braulio Kiedrowski M Catheryn Slifer, DO [facility] [phone]     [1] No Known Allergies [2]  Current Outpatient Medications:    budesonide -formoterol  (SYMBICORT ) 160-4.5 MCG/ACT inhaler, Inhale 2 puffs into the lungs 2 (two) times daily., Disp: 10.2 g, Rfl: 11   Cholecalciferol (VITAMIN D3 GUMMIES) 25 MCG (1000 UT) CHEW, Chew by mouth., Disp: , Rfl:    cyclobenzaprine  (FLEXERIL ) 10 MG tablet, Take 1 tablet (10 mg total) by mouth 3 (three) times daily as needed for muscle spasms., Disp: 90 tablet, Rfl: 5   docusate sodium  (COLACE) 100 MG capsule, Take 100 mg by mouth daily as needed (constipation.)., Disp: , Rfl:    DULoxetine  (CYMBALTA ) 60 MG capsule, Take 2 capsules (120 mg total) by mouth daily. Further fills per psychiatry, Disp: 180 capsule, Rfl: 0   furosemide  (LASIX ) 40 MG tablet, TAKE ONE TABLET BY MOUTH TWICE DAILY, Disp: 60 tablet, Rfl: 1   glucose blood (ACCU-CHEK AVIVA) test strip, Use to check blood sugar qd e11.69, Disp: 100 each, Rfl: 3   Insulin  Pen Needle (EASY COMFORT PEN NEEDLES) 31G X 5 MM MISC, UAD for insulin  injection E11.65, Disp: 100 each, Rfl: 0   Lancet Devices MISC, Test BGs QD, DX E11.69, Accucheck, Disp: 100 each, Rfl: prn   levocetirizine (XYZAL ) 5 MG tablet, TAKE 1 TABLET BY MOUTH EVERY EVENING FOR allergies, Disp: 30 tablet, Rfl: 5   lidocaine  (LIDODERM ) 5 %, Place 1 patch onto the skin daily. Remove & Discard patch within 12 hours or as directed by MD, Disp: 30 patch, Rfl: 0   lisinopril  (ZESTRIL ) 5 MG tablet, TAKE 1 TABLET BY MOUTH ONCE DAILY, Disp: 90 tablet, Rfl: 0   loratadine  (CLARITIN ) 10 MG tablet, Take 1 tablet by mouth daily., Disp: , Rfl:    lubiprostone  (AMITIZA ) 24 MCG capsule, Take 1 capsule (24 mcg total) by mouth 2 (two) times daily with a meal. To REPLACE linzess , Disp: 180 capsule, Rfl: 3   mirtazapine  (REMERON ) 15 MG tablet, Take 15 mg by mouth at bedtime., Disp: , Rfl:    Misc. Devices (TRANSPORT CHAIR) MISC, Needs lift  chair for >300lbs, Disp: 1 each, Rfl: 0   omeprazole  (PRILOSEC) 20 MG capsule, TAKE ONE CAPSULE BY MOUTH ONCE DAILY, Disp: 90 capsule, Rfl: 3   oxyCODONE -acetaminophen  (PERCOCET) 10-325 MG tablet, Take  1 tablet by mouth every 4 (four) hours as needed for pain., Disp: , Rfl:    pregabalin (LYRICA) 75 MG capsule, Take 75 mg by mouth 3 (three) times daily as needed., Disp: , Rfl:    QUEtiapine  (SEROQUEL ) 50 MG tablet, TAKE 1 TABLET BY MOUTH EVERYDAY AT BEDTIME, Disp: 90 tablet, Rfl: 0   rosuvastatin  (CRESTOR ) 20 MG tablet, TAKE 1 TABLET BY MOUTH ONCE DAILY, Disp: 90 tablet, Rfl: 0   Semaglutide , 2 MG/DOSE, (OZEMPIC , 2 MG/DOSE,) 8 MG/3ML SOPN, INJECT 2 MG AS DIRECTED ONCE A WEEK., Disp: 9 mL, Rfl: 0   solifenacin  (VESICARE ) 10 MG tablet, Take 1 tablet (10 mg total) by mouth daily., Disp: 90 tablet, Rfl: 3   topiramate  (TOPAMAX ) 100 MG tablet, Take 100 mg by mouth in the morning., Disp: , Rfl:

## 2024-08-26 NOTE — Addendum Note (Signed)
 Addended by: SHERRE SUZEN PARAS on: 08/26/2024 09:27 AM   Modules accepted: Orders

## 2024-08-29 ENCOUNTER — Encounter (HOSPITAL_COMMUNITY): Payer: Self-pay | Admitting: Emergency Medicine

## 2024-08-29 ENCOUNTER — Other Ambulatory Visit: Payer: Self-pay

## 2024-08-29 ENCOUNTER — Emergency Department (HOSPITAL_COMMUNITY)
Admission: EM | Admit: 2024-08-29 | Discharge: 2024-08-29 | Disposition: A | Discharge status: Home / Self Care | Attending: Emergency Medicine | Admitting: Emergency Medicine

## 2024-08-29 DIAGNOSIS — Z794 Long term (current) use of insulin: Secondary | ICD-10-CM | POA: Diagnosis not present

## 2024-08-29 DIAGNOSIS — F329 Major depressive disorder, single episode, unspecified: Secondary | ICD-10-CM

## 2024-08-29 DIAGNOSIS — E119 Type 2 diabetes mellitus without complications: Secondary | ICD-10-CM | POA: Diagnosis not present

## 2024-08-29 DIAGNOSIS — J449 Chronic obstructive pulmonary disease, unspecified: Secondary | ICD-10-CM | POA: Diagnosis not present

## 2024-08-29 DIAGNOSIS — Z7951 Long term (current) use of inhaled steroids: Secondary | ICD-10-CM | POA: Insufficient documentation

## 2024-08-29 DIAGNOSIS — R45851 Suicidal ideations: Secondary | ICD-10-CM | POA: Insufficient documentation

## 2024-08-29 DIAGNOSIS — F32A Depression, unspecified: Secondary | ICD-10-CM | POA: Insufficient documentation

## 2024-08-29 DIAGNOSIS — F411 Generalized anxiety disorder: Secondary | ICD-10-CM | POA: Diagnosis not present

## 2024-08-29 DIAGNOSIS — R4182 Altered mental status, unspecified: Secondary | ICD-10-CM | POA: Insufficient documentation

## 2024-08-29 DIAGNOSIS — Z7984 Long term (current) use of oral hypoglycemic drugs: Secondary | ICD-10-CM | POA: Diagnosis not present

## 2024-08-29 LAB — CBC
HCT: 46.3 % — ABNORMAL HIGH (ref 36.0–46.0)
Hemoglobin: 15.1 g/dL — ABNORMAL HIGH (ref 12.0–15.0)
MCH: 30.3 pg (ref 26.0–34.0)
MCHC: 32.6 g/dL (ref 30.0–36.0)
MCV: 93 fL (ref 80.0–100.0)
Platelets: 195 K/uL (ref 150–400)
RBC: 4.98 MIL/uL (ref 3.87–5.11)
RDW: 13 % (ref 11.5–15.5)
WBC: 9 K/uL (ref 4.0–10.5)
nRBC: 0 % (ref 0.0–0.2)

## 2024-08-29 LAB — COMPREHENSIVE METABOLIC PANEL WITH GFR
ALT: 34 U/L (ref 0–44)
AST: 70 U/L — ABNORMAL HIGH (ref 15–41)
Albumin: 4 g/dL (ref 3.5–5.0)
Alkaline Phosphatase: 73 U/L (ref 38–126)
Anion gap: 14 (ref 5–15)
BUN: 8 mg/dL (ref 6–20)
CO2: 19 mmol/L — ABNORMAL LOW (ref 22–32)
Calcium: 9.1 mg/dL (ref 8.9–10.3)
Chloride: 101 mmol/L (ref 98–111)
Creatinine, Ser: 0.71 mg/dL (ref 0.44–1.00)
GFR, Estimated: >60 mL/min
Glucose, Bld: 202 mg/dL — ABNORMAL HIGH (ref 70–99)
Potassium: 3.9 mmol/L (ref 3.5–5.1)
Sodium: 134 mmol/L — ABNORMAL LOW (ref 135–145)
Total Bilirubin: 0.4 mg/dL (ref 0.0–1.2)
Total Protein: 7.3 g/dL (ref 6.5–8.1)

## 2024-08-29 LAB — ETHANOL: Alcohol, Ethyl (B): <15 mg/dL

## 2024-08-29 MED ORDER — LORAZEPAM 1 MG PO TABS
1.0000 mg | ORAL_TABLET | Freq: Once | ORAL | Status: AC
  Administered 2024-08-29: 1 mg via ORAL
  Filled 2024-08-29: qty 1

## 2024-08-29 MED ORDER — QUETIAPINE FUMARATE 25 MG PO TABS: 25.0000 mg | ORAL_TABLET | Freq: Two times a day (BID) | ORAL | 0 refills | Status: DC | PRN

## 2024-08-29 MED ORDER — QUETIAPINE FUMARATE 25 MG PO TABS: 25.0000 mg | ORAL_TABLET | Freq: Two times a day (BID) | ORAL | Status: DC | PRN

## 2024-08-29 MED ORDER — DULOXETINE HCL 30 MG PO CPEP: 30.0000 mg | ORAL_CAPSULE | Freq: Every day | ORAL | 0 refills | Status: DC

## 2024-08-29 MED ORDER — DULOXETINE HCL 30 MG PO CPEP
30.0000 mg | ORAL_CAPSULE | Freq: Every day | ORAL | Status: DC
  Administered 2024-08-29: 30 mg via ORAL
  Filled 2024-08-29: qty 1

## 2024-08-29 NOTE — Discharge Instructions (Addendum)
 Discharge recommendations:   Medications: Patient is to take medications as prescribed. The patient or patient's guardian is to contact a medical professional and/or outpatient provider to address any new side effects that develop. The patient or the patient's guardian should update outpatient providers of any new medications and/or medication changes.    Outpatient Follow up: Please review list of outpatient resources for psychiatry and counseling. Please follow up with your primary care provider for all medical related needs.    Therapy: We recommend that patient participate in individual therapy to address mental health concerns.   Atypical antipsychotics: If you are prescribed an atypical antipsychotic, it is recommended that your height, weight, BMI, blood pressure, fasting lipid panel, and fasting blood sugar be monitored by your outpatient providers.  Safety:   The following safety precautions should be taken:   No sharp objects. This includes scissors, razors, scrapers, and putty knives.   Chemicals should be removed and locked up.   Medications should be removed and locked up.   Weapons should be removed and locked up. This includes firearms, knives and instruments that can be used to cause injury.   The patient should abstain from use of illicit substances/drugs and abuse of any medications.  If symptoms worsen or do not continue to improve or if the patient becomes actively suicidal or homicidal then it is recommended that the patient return to the closest hospital emergency department, the [facility], or call 911 for further evaluation and treatment. National Suicide Prevention Lifeline 1-800-SUICIDE or 4257562964.  About 988 988 offers 24/7 access to trained crisis counselors who can help people experiencing mental health-related distress. People can call or text 988 or chat 988lifeline.org for themselves or if they are worried about a  loved one who may need crisis support.   Outpatient Therapy and Psychiatry for Medicare Recipients  [facility] [address] [place], [place], [address] [phone] phone  [facility] [address] [place], [place], [address] [address],5TH FLOOR phone ([address] [employer] [address] - [place], [address], [place], [place], [employer], [address], [employer] Healthy [place], [place], [address], [place], [place], [place], [employer], T[employer], [employer], [employer], [place])  [facility] [address] [place], [place], [address] [phone] phone  [facility] [address], [address] [place], [place], [address] [phone] phone  [facility] [address] [place], [place], [address] [phone] phone [phone] fax  [facility] [place] [address], [place], [address] [phone] phone  [facility] [address] [place], [place], [address] [phone] phone [phone] fax  [facility] [address] [place], [place], [address] [phone] phone  Janit Griffins [address] [place], [place], [address] [phone] phone  [facility] [address] [place], [place], [address] [phone] phone [phone] fax  [facility] [place] Address: [address] [place], [place] [address] Phone: [phone]  [facility] Located in: [facility] Address: [address], [place], [place] [address] Phone: [phone]

## 2024-08-29 NOTE — ED Notes (Signed)
 Belongings secured in label bags. Placed in locker 2 and locked.   Clothes and and a pocketbook with medications from home.

## 2024-08-29 NOTE — ED Triage Notes (Signed)
 Pt in with complaints of feeling down and depressed. Pt is tearful in triage. States over the last few days she has gotten much worse and family states she told her grandson 3 days ago she wanted to kill herself. Pt only takes cymbalta  and hasn't had a dose in  1 month.
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
## 2024-08-29 NOTE — ED Provider Notes (Signed)
 " [place] EMERGENCY DEPARTMENT AT [facility] Provider Note   CSN: [id number] Arrival date & time: 08/29/24  1015     Patient presents with: Suicidal   Aimee Phillips is a 53 y.o. female.   Patient with history of COPD, diabetes and bipolar.  She has been depressed and has been talking about hurting herself.  The history is provided by the patient and a relative. No language interpreter was used.  Altered Mental Status Presenting symptoms: behavior changes   Presenting symptoms: no confusion   Severity:  Severe Most recent episode:  More than 2 days ago Episode history:  Continuous Timing:  Constant Progression:  Waxing and waning Chronicity:  New Context: not alcohol  use   Associated symptoms: no abdominal pain, no hallucinations, no headaches, no rash and no seizures        Prior to Admission medications  Medication Sig Start Date End Date Taking? Authorizing Provider  Blood Glucose Monitoring Suppl DEVI Check BGs daily. E11.69. Dispense based on patient and insurance preference. 08/26/24   [name], DO  budesonide -formoterol  (SYMBICORT ) 160-4.5 MCG/ACT inhaler Inhale 2 puffs into the lungs 2 (two) times daily. 01/09/23   [name], DO  Cholecalciferol (VITAMIN D3 GUMMIES) 25 MCG (1000 UT) CHEW Chew by mouth. Patient not taking: Reported on 08/26/2024    [provider]  cyclobenzaprine  (FLEXERIL ) 10 MG tablet Take 1 tablet (10 mg total) by mouth 3 (three) times daily as needed for muscle spasms. 04/16/24   [name], DO  docusate sodium  (COLACE) 100 MG capsule Take 100 mg by mouth daily as needed (constipation.).    [provider]  DULoxetine  (CYMBALTA ) 60 MG capsule Take 2 capsules (120 mg total) by mouth daily. Further fills per psychiatry 04/16/24   [name], DO  furosemide  (LASIX ) 40 MG tablet TAKE ONE TABLET BY MOUTH TWICE DAILY 08/28/22   Jolinda Norene M, DO  glipiZIDE  (GLUCOTROL  XL) 2.5 MG 24  hr tablet Take 1 tablet (2.5 mg total) by mouth daily with breakfast. For diabetes 08/26/24   Jolinda Norene M, DO  Glucose Blood (BLOOD GLUCOSE TEST STRIPS) STRP Check BGs daily. E11.69. Dispense based on patient and insurance preference. 08/26/24   [name], DO  Insulin  Pen Needle (EASY COMFORT PEN NEEDLES) 31G X 5 MM MISC UAD for insulin  injection E11.65 07/31/22   [name], DO  Lancet Device MISC Check BGs daily. E11.69. Dispense based on patient and insurance preference. 08/26/24   [name], DO  Lancets MISC Check BGs daily. E11.69. Dispense based on patient and insurance preference. 08/26/24   [name], DO  levocetirizine (XYZAL ) 5 MG tablet TAKE 1 TABLET BY MOUTH EVERY EVENING FOR allergies 04/29/24   Jolinda Norene M, DO  lisinopril  (ZESTRIL ) 5 MG tablet TAKE 1 TABLET BY MOUTH ONCE DAILY 06/09/24   Jolinda Norene M, DO  loratadine  (CLARITIN ) 10 MG tablet Take 1 tablet by mouth daily. 05/11/21   [provider]  lubiprostone  (AMITIZA ) 24 MCG capsule Take 1 capsule (24 mcg total) by mouth 2 (two) times daily with a meal. To REPLACE linzess  04/18/24   Jolinda Norene M, DO  mirtazapine  (REMERON ) 15 MG tablet Take 15 mg by mouth at bedtime.    [provider]  Misc. Devices [facility]) MISC Needs lift chair for >300lbs 08/03/20   Jolinda Norene M, DO  omeprazole  (PRILOSEC) 20 MG capsule TAKE ONE CAPSULE BY MOUTH ONCE DAILY 04/16/24  Jolinda Potter M, DO  oxyCODONE -acetaminophen  (PERCOCET) 10-325 MG tablet Take 1 tablet by mouth every 4 (four) hours as needed for pain. 02/27/22   [provider]  pregabalin (LYRICA) 75 MG capsule Take 75 mg by mouth 3 (three) times daily as needed. 03/25/24   [provider]  QUEtiapine  (SEROQUEL ) 50 MG tablet TAKE 1 TABLET BY MOUTH EVERYDAY AT BEDTIME Patient not taking: Reported on 08/26/2024 06/20/24   Jolinda Potter HERO, DO  rosuvastatin  (CRESTOR ) 20 MG tablet Take  1 tablet (20 mg total) by mouth daily. 08/26/24   Jolinda Potter HERO, DO  Semaglutide , 2 MG/DOSE, (OZEMPIC , 2 MG/DOSE,) 8 MG/3ML SOPN Inject 2 mg into the skin every 7 (seven) days. 08/26/24   Jolinda Potter HERO, DO  solifenacin  (VESICARE ) 10 MG tablet Take 1 tablet (10 mg total) by mouth daily. 08/26/24   Jolinda Potter HERO, DO  topiramate  (TOPAMAX ) 100 MG tablet Take 1 tablet (100 mg total) by mouth in the morning. 08/26/24   Jolinda Potter HERO, DO    Allergies: Patient has no known allergies.    Review of Systems  Constitutional:  Negative for appetite change and fatigue.  HENT:  Negative for congestion, ear discharge and sinus pressure.   Eyes:  Negative for discharge.  Respiratory:  Negative for cough.   Cardiovascular:  Negative for chest pain.  Gastrointestinal:  Negative for abdominal pain and diarrhea.  Genitourinary:  Negative for frequency and hematuria.  Musculoskeletal:  Negative for back pain.  Skin:  Negative for rash.  Neurological:  Negative for seizures and headaches.  Psychiatric/Behavioral:  Positive for dysphoric mood and suicidal ideas. Negative for confusion and hallucinations.     Updated Vital Signs BP (!) 110/59   Pulse 99   Temp 98.6 F (37 C) (Oral)   Resp (!) 22   Ht 5' 8 (1.727 m)   Wt (!) 145 kg   LMP  (LMP Unknown)   SpO2 98%   BMI 48.61 kg/m   Physical Exam Vitals and nursing note reviewed.  Constitutional:      Appearance: She is well-developed.  HENT:     Head: Normocephalic.     Nose: Nose normal.  Eyes:     General: No scleral icterus.    Conjunctiva/sclera: Conjunctivae normal.  Neck:     Thyroid : No thyromegaly.  Cardiovascular:     Rate and Rhythm: Normal rate and regular rhythm.     Heart sounds: No murmur heard.    No friction rub. No gallop.  Pulmonary:     Breath sounds: No stridor. No wheezing or rales.  Chest:     Chest wall: No tenderness.  Abdominal:     General: There is no distension.     Tenderness: There  is no abdominal tenderness. There is no rebound.  Musculoskeletal:        General: Normal range of motion.     Cervical back: Neck supple.  Lymphadenopathy:     Cervical: No cervical adenopathy.  Skin:    Findings: No erythema or rash.  Neurological:     Mental Status: She is alert and oriented to person, place, and time.     Motor: No abnormal muscle tone.     Coordination: Coordination normal.  Psychiatric:     Comments: Depressed and suicidal     (all labs ordered are listed, but only abnormal results are displayed) Labs Reviewed  COMPREHENSIVE METABOLIC PANEL WITH GFR - Abnormal; Notable for the following components:  Result Value   Sodium 134 (*)    CO2 19 (*)    Glucose, Bld 202 (*)    AST 70 (*)    All other components within normal limits  CBC - Abnormal; Notable for the following components:   Hemoglobin 15.1 (*)    HCT 46.3 (*)    All other components within normal limits  ETHANOL  URINE DRUG SCREEN    EKG: None  Radiology: No results found.   Procedures   Medications Ordered in the ED  LORazepam (ATIVAN) tablet 1 mg (1 mg Oral Given 08/29/24 1145)   Patient is medically cleared and has depression and suicidal ideations.  She will be seen by behavioral health    Patient is medically cleared                               Medical Decision Making Amount and/or Complexity of Data Reviewed Labs: ordered.  Risk Prescription drug management.   Depression with suicidal ideation     Final diagnoses:  None    ED Discharge Orders     None          Suzette Pac, MD 08/29/24 1341  "

## 2024-08-29 NOTE — ED Notes (Signed)
 Pt and family educated on [employer] and visitor guidelines, family member at bedside given copy of [employer] policy paperwork. Security called to bedside to wand pt

## 2024-09-01 ENCOUNTER — Telehealth: Payer: Self-pay | Admitting: Family Medicine

## 2024-09-01 NOTE — Telephone Encounter (Signed)
 Copied from CRM #[id number]. Topic: Referral - Prior Authorization Question >> Sep 01, 2024  9:33 AM Marda MATSU wrote: Lang the son, states mother needs a referral or pre authorization for a power chair.   The number to mylene is member provider services# [phone] and fax # [phone]...   Please advise

## 2024-09-01 NOTE — Telephone Encounter (Signed)
 Copied from CRM #[id number]. Topic: Referral - Request for Referral >> Sep 01, 2024 12:53 PM Aimee Phillips wrote: Did the patient discuss referral with their provider in the last year? Yes (If No - schedule appointment) (If Yes - send message)  Appointment offered? Yes  Type of order/referral and detailed reason for visit: Psychiatric  Preference of office, provider, location: [facility] [phone]  If referral order, have you been seen by this specialty before? no (If Yes, this issue or another issue? When? Where?  Can we respond through MyChart? Yes

## 2024-09-01 NOTE — Telephone Encounter (Signed)
 Informed son that pt will need a visit w/ PCP for documentation and order. He should contact ins company to see who they use, [employer], [employer], ie. He will call back to make appt

## 2024-09-02 ENCOUNTER — Other Ambulatory Visit: Payer: Self-pay | Admitting: Family Medicine

## 2024-09-02 ENCOUNTER — Ambulatory Visit (INDEPENDENT_AMBULATORY_CARE_PROVIDER_SITE_OTHER)

## 2024-09-02 DIAGNOSIS — F331 Major depressive disorder, recurrent, moderate: Secondary | ICD-10-CM

## 2024-09-02 DIAGNOSIS — I152 Hypertension secondary to endocrine disorders: Secondary | ICD-10-CM

## 2024-09-02 DIAGNOSIS — F411 Generalized anxiety disorder: Secondary | ICD-10-CM

## 2024-09-02 NOTE — Telephone Encounter (Signed)
 Referral updated and sent.
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
## 2024-09-10 ENCOUNTER — Encounter: Payer: Self-pay | Admitting: Family Medicine

## 2024-09-10 ENCOUNTER — Ambulatory Visit (INDEPENDENT_AMBULATORY_CARE_PROVIDER_SITE_OTHER): Admitting: Family Medicine

## 2024-09-10 VITALS — BP 94/65 | HR 105 | Temp 98.0°F | Ht 68.0 in | Wt 320.0 lb

## 2024-09-10 DIAGNOSIS — Z20822 Contact with and (suspected) exposure to covid-19: Secondary | ICD-10-CM | POA: Diagnosis not present

## 2024-09-10 DIAGNOSIS — G8929 Other chronic pain: Secondary | ICD-10-CM

## 2024-09-10 DIAGNOSIS — Z789 Other specified health status: Secondary | ICD-10-CM

## 2024-09-10 DIAGNOSIS — F410 Panic disorder [episodic paroxysmal anxiety] without agoraphobia: Secondary | ICD-10-CM | POA: Diagnosis not present

## 2024-09-10 DIAGNOSIS — F411 Generalized anxiety disorder: Secondary | ICD-10-CM | POA: Diagnosis not present

## 2024-09-10 DIAGNOSIS — Z7409 Other reduced mobility: Secondary | ICD-10-CM | POA: Diagnosis not present

## 2024-09-10 DIAGNOSIS — G629 Polyneuropathy, unspecified: Secondary | ICD-10-CM | POA: Diagnosis not present

## 2024-09-10 DIAGNOSIS — M545 Low back pain, unspecified: Secondary | ICD-10-CM | POA: Diagnosis not present

## 2024-09-10 DIAGNOSIS — F4310 Post-traumatic stress disorder, unspecified: Secondary | ICD-10-CM

## 2024-09-10 DIAGNOSIS — F332 Major depressive disorder, recurrent severe without psychotic features: Secondary | ICD-10-CM

## 2024-09-10 MED ORDER — QUETIAPINE FUMARATE 25 MG PO TABS: 25.0000 mg | ORAL_TABLET | Freq: Two times a day (BID) | ORAL | 0 refills | Status: DC | PRN

## 2024-09-10 MED ORDER — NIRMATRELVIR/RITONAVIR (PAXLOVID)TABLET: 3.0000 | ORAL_TABLET | Freq: Two times a day (BID) | ORAL | 0 refills | Status: AC
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
## 2024-09-18 ENCOUNTER — Ambulatory Visit (INDEPENDENT_AMBULATORY_CARE_PROVIDER_SITE_OTHER)

## 2024-09-18 DIAGNOSIS — F411 Generalized anxiety disorder: Secondary | ICD-10-CM

## 2024-09-18 DIAGNOSIS — Z91199 Patient's noncompliance with other medical treatment and regimen due to unspecified reason: Secondary | ICD-10-CM

## 2024-09-18 DIAGNOSIS — F410 Panic disorder [episodic paroxysmal anxiety] without agoraphobia: Secondary | ICD-10-CM

## 2024-09-18 NOTE — Progress Notes (Unsigned)
 Connected with Jon via video conference.  She was in her vehicle with her middle son and they are moving house today.  She was unable to have the privacy and time to complete today's appointment but rescheduled for 09/22/24 at 4 PM

## 2024-09-22 ENCOUNTER — Telehealth: Payer: Self-pay | Admitting: Family Medicine

## 2024-09-22 ENCOUNTER — Ambulatory Visit (INDEPENDENT_AMBULATORY_CARE_PROVIDER_SITE_OTHER): Payer: Self-pay

## 2024-09-22 DIAGNOSIS — Z91199 Patient's noncompliance with other medical treatment and regimen due to unspecified reason: Secondary | ICD-10-CM

## 2024-09-22 NOTE — Telephone Encounter (Signed)
 Copied from CRM [id number]. Topic: General - Other >> Sep 22, 2024  9:27 AM Edsel HERO wrote: Patient's son called to get an update on the order for patient's power wheelchair. Please advise.

## 2024-09-22 NOTE — Telephone Encounter (Signed)
 I spoke with patient's son. He is aware to call [facility] [address] [place], [place], [place] [address] Phone: [phone] for scheduling.

## 2024-09-22 NOTE — Progress Notes (Signed)
 Aimee Phillips did not arrive for scheduled video visit.  Texted X2 to the number requested.

## 2024-09-22 NOTE — Telephone Encounter (Signed)
 Please update pt on referral to Neuro Rehab for evaluation for motorized wheelchair

## 2024-09-26 ENCOUNTER — Ambulatory Visit: Attending: Family Medicine

## 2024-09-26 ENCOUNTER — Other Ambulatory Visit: Payer: Self-pay

## 2024-09-26 DIAGNOSIS — R2689 Other abnormalities of gait and mobility: Secondary | ICD-10-CM | POA: Insufficient documentation

## 2024-09-26 DIAGNOSIS — M6281 Muscle weakness (generalized): Secondary | ICD-10-CM | POA: Insufficient documentation

## 2024-09-26 NOTE — Therapy (Unsigned)
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    MANUAL FRAME OPTIONS      Push handles  []  extended   []  angle adjustable   []  standard  []  caregiver access  []  caregiver assist    []  allows hooking to enable      increased ability to perform ADLs or maintain balance   []  Angle Adjustable Back  []  postural control  []  control of tone/spasticity  []  accommodation of range of motion  []  UE functional control  []  accommodation for seating system    Rear wheel placement  []  std/fixed  [] fully adjustableramputee   []  camber ________degree  []  removable rear wheel  []  non-removable rear wheel  Wheel size _______  Wheel style_______________________  []  improved UE access to wheels  []  increase propulsion ability  []  improved stability  []  changing angle in space for  improvement of postural stability  []  remove for transport    []  allow for seating system to fit on  base  []  amputee placement  []  1-arm drive access   r R  r L  []  enable propulsion of manual       wheelchair with one arm     []  amputee placement   Wheel rims/ Hand rims  []  Standard    []  Specialized-____ []  provide ability to propel manual   []  increase self-propulsion with hand wheelchair weakness/decreased grasp     []  Spoke protector/guard   []  prevent hands from getting caught in spokes   Tires:  []  pneumatic  []  flat free inserts  []  solid  Style:  []  decrease roll resistance              []  prevent frequent flats  []  increase shock absorbency  []  decrease maintenance   []  decrease pain from road shock    []  decrease spasms from road shock    Wheel Locks:    []  push []  pull []  scissor  []  lock wheels for transfers  []  lock wheels from rolling   Brake/wheel lock extension:  []  R  []  L  []  allow user to operate wheel locks due to decreased reach or strength   Caster housing:  Caster size:                      Style:                                          []  suspension fork  []  maneuverability   []  stability of wheelchair   []  durability  []  maintenance  []  angle adjustment for posture  []  allow for feet to come under        wheelchair base  []  allows change in seat to floor  height   []  increase shock absorbency  []  decrease pain from road shock  []  decrease spasms from road    shock   []  Side guards  []  prevent clothing getting caught in wheel or becoming soiled   [] provide hip and pelvic stability  []  eliminates contact between body and wheels  []  limit hand contact with wheels   []  Anti-tippers      []  prevent wheelchair from tipping    backward  []  assist caregiver with curbs     POWER MOBILITY      []  Scooter/POV    []  can safely operate   []  can safely transfer   []  has adequate trunk stability   []  cannot functionally propel  manual wheelchair    [x]  Power mobility base    []  non-ambulatory   [x]  cannot functionally propel manual wheelchair   [x]  cannot functionally and safely      operate scooter/POV  [x]  can safely operate power       wheelchair  [x]  home is accessible   [x]  willing to use power wheelchair     Tilt  []  Powered tilt on powered chair  []  Powered tilt on manual chair  []  Manual tilt on manual chair Comments:  []  change position for pressure      []  elief/cannot weight shift   []  change position against      gravitational force on head and      shoulders   []  decrease  pain  []  blood pressure management   []  control autonomic dysreflexia  []  decrease respiratory distress  []  management of spasticity  []  management of low tone  []  facilitate postural control   []  rest periods   []  control edema  []  increase sitting tolerance   []  aid with transfers     Recline   []  Power recline on power chair  []  Manual recline on manual chair  Comments:    []  intermittent catheterization  []  manage spasticity  []  accommodate femur to back angle  []  change position for pressure relief/cannot weight shift rhigh risk of pressure sore development  []  tilt alone does not accomplish     effective pressure relief, maximum pressure relief achieved at -      _______ degrees tilt   _______ degrees recline   []  difficult to transfer to and from bed []  rest periods and sleeping in chair  []  repositioning for transfers  []  bring to full recline for ADL care  []  clothing/diaper changes in chair  []  gravity PEG tube feeding  []  head positioning  []  decrease pain  []  blood pressure management   []  control autonomic dysreflexia  []  decrease respiratory distress  []  user on ventilator     Elevator on mobility base  []  Power wheelchair  []  Scooter  []  increase Indep in transfers   []  increase Indep in ADLs    []  bathroom function and safety  []  kitchen/cooking function and safety  []  shopping  []  raise height for communication at standing level  []  raise height for eye contact which reduces cervical neck strain and pain  []  drive at raised height for safety and navigating crowds  []  Other:   []  Vertical position system  (anterior tilt)     (Drive  locks-out)    []  Stand       (Drive enabled)  []  independent weight bearing  []  decrease joint contractures  []  decrease/manage spasticity  []  decrease/manage spasms  []  pressure distribution away from   scapula, sacrum, coccyx, and ischial tuberosity  []  increase digestion and elimination   []  access to counters and cabinets  []  increase reach  []  increase interaction with others at eye level, reduces neck strain  []  increase performance of       MRADL(s)      Power elevating legrest    []  Center mount (Single) 85-170 degrees       []  Standard (Pair) 100-170 degrees  []  position legs at 90 degrees, not available with std power ELR  []  center mount tucks into chair to decrease turning radius in home, not available with std power ELR  []  provide change in position for LE  []  elevate legs during recline    []  maintain placement of feet on      footplate  []  decrease edema  []  improve circulation  []  actuator needed to elevate legrest  []  actuator needed to articulate legrest preventing knees from flexing  []  Increase ground clearance over      curbs  []   STD (pair) independently                     elevate legrest   POWER WHEELCHAIR CONTROLS      Controls/input device  []  Expandable  [x]  Non-expandable  []  Proportional  []  Right Hand [x]  Left Hand  []  Non-proportional/switches/head-array  []  Electrical/proximity         []   Mechanical  Manufacturer:___________________   Type:________________________ [x]  provides access for controlling wheelchair  [x]  programming for accurate control  []  progressive disease/changing condition  []  required for alternative drive      controls       []  lacks motor control to operate  proportional drive control  []  unable to understand proportional controls  []  limited movement/strength  []  extraneous movement / tremors / ataxic / spastic       []  Upgraded electronics controller/harness    []  Single power (tilt or recline)   []   Expandable    []  Non-expandable plus   []  Multi-power (tilt, recline, power legrest, power seat lift, vertical positioning system, stand)  []  allows input device to communicate with drive motors  []  harness provides necessary connections between the controller, input device, and seat functions     []  needed in order to operate power seat functions through joystick/ input device  []  required for alternative drive controls     []  Enhanced display  []  required to connect all alternative drive controls   []  required for upgraded joystick      (lite-throw, heavy duty, micro)  []  Allows user to see in which mode and drive the wheelchair is set; necessary for alternate controls       []  Upgraded tracking electronics  []  correct tracking when on uneven surfaces makes switch driving more efficient and less fatiguing  []  increase safety when driving  []  increase ability to traverse thresholds    []  Safety / reset / mode switches     Type:    []  Used to change modes and stop the wheelchair when driving     [x]  Mount for joystick / input device/switches  [x]  swing away for access or transfers   [x]  attaches joystick / input device / switches to wheelchair   [x]  provides for consistent access  []  midline for optimal placement    []  Attendant controlled joystick plus     mount  []  safety  []  long distance driving  []  operation of seat functions  []  compliance with transportation regulations    [x]  Battery  [x]  required to power (power assist / scooter/ power wc / other):   []  Power inverter (24V to 12V)  []  required for ventilator / respiratory equipment / other:     CHAIR OPTIONS MANUAL & POWER      Armrests   [x]  adjustable height []  removable  []  swing away []  fixed  [x]  flip back  []  reclining  [x]  full length pads []  desk []  tube arms []  gel pads  [x]  provide support with elbow at 90    []  remove/flip back/swing away for  transfers  []  provide support and positioning of upper body    []   allow to come closer to table top  []  remove for access to tables  []  provide support for w/c tray  []  change of height/angles for variable activities   []  Elbow support / Elbow stop  []  keep elbow positioned on arm pad  []  keep arms from falling off arm pad  during tilt and/or recline   Upper Extremity Support  []  Arm trough  []   R  []   L  Style:  []  swivel mount []  fixed mount   []  posterior hand support  []   tray  []  full tray  []  joystick cut out  []   R  []   L  Style:  []  decrease gravitational pull on  shoulders  []  provide support to increase UE  function  []  provide hand support in natural    position  []  position flaccid UE  []  decrease subluxation    []  decrease edema       []  manage spasticity   []  provide midline positioning  []  provide work surface  []  placement for AAC/ Computer/ EADL       Hangers/ Legrests   []  ______ degree  []  Elevating []  articulating  []  swing away []  fixed []  lift off  []  heavy duty  []  adjustable knee angle  []  adjustable calf panel   []  longer extension tube              []  provide LE support  []  maintain placement of feet on      footplate   []  accommodate lower leg length  []  accommodate to hamstring       tightness  []  enable transfers  []  provide change in position for LE's  []  elevate legs during recline    []  decrease edema  []  durability      Foot support   [x]  footplate []  R []  L [x]  flip up           []  Depth adjustable   []  angle adjustable  []  foot board/one piece    [x]  provide foot support  []  accommodate to ankle ROM  []  allow foot to go under wheelchair base  []  enable transfers     []  Shoe holders  []  position foot    []  decrease / manage spasticity  []  control position of LE  []  stability    []  safety     []  Ankle strap/heel      loops  []  support foot on foot support  []  decrease extraneous movement  []  provide input to heel   []  protect foot     []  Amputee adapter []  R  []  L     Style:                   Size:  []  Provide support for stump/residual extremity    []  Transportation tie-down  []  to provide crash tested tie-down brackets    []  Crutch/cane holder    []  O2 holder    []  IV hanger   []  Ventilator tray/mount    []  stabilize accessory on wheelchair       Component  Justification     [x]  Seat cushion      []  accommodate impaired sensation  []  decubitus ulcers present or history  []  unable to shift weight  []  increase pressure distribution  []  prevent pelvic extension  []  custom required off-the-shelf    seat cushion will not accommodate deformity  []  stabilize/promote pelvis alignment  []  stabilize/promote femur alignment  []  accommodate obliquity  []  accommodate multiple deformity  [x]  incontinent/accidents  [x]  low maintenance     []  seat mounts                 []  fixed []  removable  []  attach seat platform/cushion to wheelchair frame    []  Seat wedge    []  provide increased aggressiveness of seat shape to decrease sliding  down in the seat  []  accommodate ROM        []  Cover replacement   []  protect back or seat cushion  []  incontinent/accidents    []  Solid seat / insert    []  support cushion to prevent  hammocking  []  allows attachment of cushion to mobility base    []  Lateral pelvic/thigh/hip     support (Guides)     []  decrease abduction  []  accommodate pelvis  []  position upper legs  []  accommodate spasticity  []  removable for transfers     []  Lateral pelvic/thigh      supports mounts  []  fixed   []  swing-away   []  removable  []  mounts lateral pelvic/thigh supports     []  mounts lateral pelvic/thigh supports swing-away or removable for transfers    []  Medial thigh support (Pommel)  [] decrease adduction  [] accommodate ROM  []  remove for transfers   []  alignment      []  Medial thigh   []  fixed      support mounts      []  swing-away   []  removable  []  mounts medial thigh supports   []  Mounts medial supports swing- away or removable for transfers        Component  Justification   [x]  Back       [x]  provide posterior trunk support []  facilitate tone  []  provide lumbar/sacral support []  accommodate deformity  []  support trunk in midline   []  custom required off-the-shelf back support will not accommodate deformity   []  provide lateral trunk support []  accommodate or decrease tone            []  Back mounts  []  fixed  []  removable  []  attach back rest/cushion to wheelchair frame   []  Lateral trunk      supports  []  R []  L  []  decrease lateral trunk leaning  []  accommodate asymmetry    []  contour for increased contact  []  safety    []  control of tone    []  Lateral trunk      supports mounts  []  fixed  []  swing-away   []  removable  []  mounts lateral trunk supports     []  Mounts lateral trunk supports swing-away or removable for transfers   []  Anterior chest      strap, vest     []  decrease forward movement of shoulder  []  decrease forward movement of trunk  []  safety/stability  []  added abdominal support  []  trunk alignment  []  assistance with shoulder control   []  decrease shoulder elevation    []  Headrest      []  provide posterior head support  []  provide posterior neck support  []  provide lateral head support  []  provide anterior head support  []  support during tilt and recline  []  improve feeding     []  improve respiration  []  placement of switches  []  safety    []  accommodate ROM   []  accommodate tone  []  improve visual orientation   []  Headrest           []  fixed []  removable []  flip down      Mounting hardware   []  swing-away laterals/switches  []  mount headrest   []  mounts headrest flip down or  removable for transfers  []  mount headrest swing-away laterals   []  mount switches     []  Neck Support    []  decrease neck rotation  []  decrease forward neck flexion   Pelvic Positioner    [x]  std hip belt          []  padded hip belt  []  dual pull hip belt  []  four point hip belt  []  stabilize tone  [x]  decrease  falling out of chair  []   prevent excessive extension  []  special pull angle to control      rotation  []  pad for protection over boney   prominence  []  promote comfort    []  Essential needs        bag/pouch   []  medicines []  special food rorthotics []  clothing changes  []  diapers  []  catheter/hygiene []  ostomy supplies   The above equipment has a life- long use expectancy.  Growth and changes in medical and/or functional conditions would be the exceptions.   SUMMARY:    ASSESSMENT:  CLINICAL IMPRESSION: The patient is a 54 year old female who was seen today for a physical therapy evaluation and treatment related to the need for a power mobility device. The patient previously received a group 2 power chair approximately two years ago, however the device became malfunctioning and she was unable to have it repaired. She is currently using a rollator, but her mobility is significantly limited due to chronic low back pain, chronic leg pain, diabetic neuropathy, and left shoulder pain. During evaluation the patient demonstrated a time of 44 seconds on the Timed Up and Go test using a rolling walker with standby assistance. This performance indicates severely impaired mobility and a markedly increased risk for falls. The patients current weight is 320 pounds. Due to morbid obesity and limited range of motion and strength in the left upper extremity, the patient is unable to self propel any type of manual wheelchair.  The patient is able to stand using a rollator to off load weight from her seated surface which helps reduce risk of pressure injury throughout the day. She is unable to safely ambulate functional distances within her home or community using a rollator due to pain, impaired strength, impaired balance, and fatigue.  Based on the evaluation findings the most appropriate mobility device to improve the patients functional mobility and independence is a group 2 power chair with a captains chair.  The patient will require a heavy duty version due to her morbid obesity. This device will allow her to safely navigate her home environment, reduce fall risk, and maintain independence with essential daily activities. The patient is able to intermittently stand using the rollator to reduce prolonged sitting time and minimize pressure related skin injury. This power mobility device is medically necessary to ensure the patients safety, mobility, and independent function.   OBJECTIVE IMPAIRMENTS Abnormal gait, cardiopulmonary status limiting activity, decreased activity tolerance, decreased balance, decreased endurance, decreased knowledge of use of DME, decreased mobility, difficulty walking, decreased ROM, decreased strength, increased edema, increased muscle spasms, impaired flexibility, impaired sensation, impaired UE functional use, improper body mechanics, postural dysfunction, obesity, and pain.   ACTIVITY LIMITATIONS carrying, lifting, bending, standing, squatting, stairs, transfers, bathing, dressing, and reach over head  PARTICIPATION LIMITATIONS: meal prep, cleaning, laundry, driving, shopping, community activity, and yard work  PERSONAL FACTORS Past/current experiences, Time since onset of injury/illness/exacerbation, and 1-2 comorbidities: DM, obesity, compression fracture T12 are also affecting patient's functional outcome.   REHAB POTENTIAL: Good  CLINICAL DECISION MAKING: Stable/uncomplicated  EVALUATION COMPLEXITY: Moderate                                   GOALS: One time visit. No goals established.    PLAN: PT FREQUENCY: one time visit    Raj LOISE Blanch, PT 09/26/2024, 11:48 AM    I concur with the above findings and recommendations of the therapist:  Physician name printed:         Physician's signature:      Date:      "

## 2024-09-29 ENCOUNTER — Other Ambulatory Visit: Payer: Self-pay | Admitting: Family Medicine

## 2024-09-29 DIAGNOSIS — Z7985 Long-term (current) use of injectable non-insulin antidiabetic drugs: Secondary | ICD-10-CM

## 2024-09-29 MED ORDER — DULOXETINE HCL 30 MG PO CPEP: 30.0000 mg | ORAL_CAPSULE | Freq: Every day | ORAL | 0 refills | Status: AC

## 2024-09-29 NOTE — Telephone Encounter (Signed)
 Will give 30-day refill but please make sure that she is establish care with [facility].  She was previously referred to them after having had a hospital eval for mental health.  They will need to take over her medications as she is on multiple psychiatric medications

## 2024-09-29 NOTE — Telephone Encounter (Signed)
 Copied from CRM [id number]. Topic: Clinical - Medication Refill >> Sep 29, 2024 11:34 AM Gattis SQUIBB wrote: Medication: DULoxetine  (CYMBALTA ) 30 MG capsule  Has the patient contacted their pharmacy? No (Agent: If no, request that the patient contact the pharmacy for the refill. If patient does not wish to contact the pharmacy document the reason why and proceed with request.) (Agent: If yes, when and what did the pharmacy advise?)  This is the patient's preferred pharmacy:  [facility] - [place], [place] - [address] [place] [address] Phone: [phone] Fax: [phone]   Is this the correct pharmacy for this prescription? Yes If no, delete pharmacy and type the correct one.   Has the prescription been filled recently? Yes  Is the patient out of the medication? Yes  Has the patient been seen for an appointment in the last year OR does the patient have an upcoming appointment? Yes  Can we respond through MyChart? No  Agent: Please be advised that Rx refills may take up to 3 business days. We ask that you follow-up with your pharmacy.

## 2024-09-30 ENCOUNTER — Telehealth: Payer: Self-pay | Admitting: Family Medicine

## 2024-09-30 ENCOUNTER — Other Ambulatory Visit: Payer: Self-pay | Admitting: Family Medicine

## 2024-09-30 DIAGNOSIS — Z7985 Long-term (current) use of injectable non-insulin antidiabetic drugs: Secondary | ICD-10-CM

## 2024-09-30 NOTE — Telephone Encounter (Signed)
 Referral sent to [facility] [address] [place], [place] [address] [phone] Copied from CRM [id number]. Topic: Referral - Question >> Sep 30, 2024  9:37 AM Myrick T wrote: Reason for CRM: patents son said [facility] does not accept [facility] and request to have the referral sent to [facility] [place]. Please f/u with son

## 2024-10-01 ENCOUNTER — Other Ambulatory Visit: Payer: Self-pay | Admitting: Family Medicine

## 2024-10-01 ENCOUNTER — Ambulatory Visit (INDEPENDENT_AMBULATORY_CARE_PROVIDER_SITE_OTHER): Payer: Self-pay

## 2024-10-01 ENCOUNTER — Encounter (HOSPITAL_COMMUNITY): Payer: Self-pay

## 2024-10-01 DIAGNOSIS — F4310 Post-traumatic stress disorder, unspecified: Secondary | ICD-10-CM

## 2024-10-01 DIAGNOSIS — F332 Major depressive disorder, recurrent severe without psychotic features: Secondary | ICD-10-CM

## 2024-10-01 DIAGNOSIS — F411 Generalized anxiety disorder: Secondary | ICD-10-CM

## 2024-10-01 DIAGNOSIS — F331 Major depressive disorder, recurrent, moderate: Secondary | ICD-10-CM
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
## 2024-10-01 NOTE — Progress Notes (Deleted)
" ° °  THERAPIST PROGRESS NOTE  Session Time: ***  Participation Level: {BHH PARTICIPATION LEVEL:22264}  Behavioral Response: {Appearance:22683}{BHH LEVEL OF CONSCIOUSNESS:22305}{BHH MOOD:22306}  Type of Therapy: {CHL AMB BH Type of Therapy:21022741}  Treatment Goals addressed: ***  ProgressTowards Goals: {Progress Towards Goals:21014066}  Interventions: {CHL AMB BH Type of Intervention:21022753}  Summary: Aimee Phillips is a 55 y.o. female who presents with ***.   Suicidal/Homicidal: {BHH YES OR NO:22294}{yes/no/with/without intent/plan:22693}  Therapist Response: ***  Plan: Return again in *** weeks.  Diagnosis: No diagnosis found.  Collaboration of Care: {BH OP Collaboration of Care:21014065}  Patient/Guardian was advised Release of Information must be obtained prior to any record release in order to collaborate their care with an outside provider. Patient/Guardian was advised if they have not already done so to contact the registration department to sign all necessary forms in order for us  to release information regarding their care.   Consent: Patient/Guardian gives verbal consent for treatment and assignment of benefits for services provided during this visit. Patient/Guardian expressed understanding and agreed to proceed.   Lauraine Ferrari, LCSW 10/01/2024  "

## 2024-10-15 ENCOUNTER — Telehealth: Payer: Self-pay | Admitting: Family Medicine

## 2024-10-15 ENCOUNTER — Ambulatory Visit (HOSPITAL_COMMUNITY)

## 2024-10-15 NOTE — Telephone Encounter (Unsigned)
 Copied from CRM #[id number]. Topic: Clinical - Medical Advice >> Oct 15, 2024  1:13 PM Aimee Phillips wrote: Reason for CRM: Pt is requesting an updated letter for her emotional support animal. Pt would like to come in to the office to pick up letter. In addition, she states she would like the letter preferably before the 12th of Feb. Please contact patient at [phone].

## 2024-10-15 NOTE — Telephone Encounter (Signed)
 Called and spoke with patient and made her aware that PCP will be back in the office on 2/19 to advise on letter. Patient okay with this. Does not need letter urgently. Will forward to PCP to advise on when she returns to office.

## 2024-10-15 NOTE — Progress Notes (Signed)
 Aimee Phillips did not arrive for her virtual appointment at 9 AM today.  Therapist texted her phone on file [phone] and waited 10 minutes

## 2025-01-08 ENCOUNTER — Ambulatory Visit (HOSPITAL_COMMUNITY): Admitting: Registered Nurse
